# Patient Record
Sex: Female | Born: 1935 | Race: White | Hispanic: No | Smoking: Current every day smoker
Health system: Southern US, Community
[De-identification: ages and names within clinical notes are randomized; demographics above are authoritative.]

## PROBLEM LIST (undated history)

## (undated) DIAGNOSIS — J189 Pneumonia, unspecified organism: Secondary | ICD-10-CM

## (undated) DIAGNOSIS — R112 Nausea with vomiting, unspecified: Secondary | ICD-10-CM

## (undated) DIAGNOSIS — C801 Malignant (primary) neoplasm, unspecified: Secondary | ICD-10-CM

## (undated) DIAGNOSIS — H3530 Unspecified macular degeneration: Secondary | ICD-10-CM

## (undated) DIAGNOSIS — Z9889 Other specified postprocedural states: Secondary | ICD-10-CM

## (undated) DIAGNOSIS — K219 Gastro-esophageal reflux disease without esophagitis: Secondary | ICD-10-CM

## (undated) DIAGNOSIS — C189 Malignant neoplasm of colon, unspecified: Secondary | ICD-10-CM

## (undated) DIAGNOSIS — T4145XA Adverse effect of unspecified anesthetic, initial encounter: Secondary | ICD-10-CM

## (undated) DIAGNOSIS — T8859XA Other complications of anesthesia, initial encounter: Secondary | ICD-10-CM

## (undated) DIAGNOSIS — M1990 Unspecified osteoarthritis, unspecified site: Secondary | ICD-10-CM

## (undated) HISTORY — PX: SHOULDER ARTHROSCOPY: SHX128

## (undated) HISTORY — PX: HAND SURGERY: SHX662

## (undated) HISTORY — PX: EYE SURGERY: SHX253

## (undated) HISTORY — PX: JOINT REPLACEMENT: SHX530

## (undated) HISTORY — PX: OTHER SURGICAL HISTORY: SHX169

## (undated) HISTORY — PX: TONSILLECTOMY: SUR1361

## (undated) HISTORY — PX: MINOR HEMORRHOIDECTOMY: SHX6238

## (undated) HISTORY — PX: TOTAL KNEE ARTHROPLASTY: SHX125

## (undated) HISTORY — PX: CATARACT EXTRACTION, BILATERAL: SHX1313

## (undated) HISTORY — PX: HEMORRHOID SURGERY: SHX153

## (undated) HISTORY — PX: SHOULDER SURGERY: SHX246

---

## 1998-12-24 ENCOUNTER — Ambulatory Visit (HOSPITAL_BASED_OUTPATIENT_CLINIC_OR_DEPARTMENT_OTHER): Admission: RE | Admit: 1998-12-24 | Discharge: 1998-12-24 | Payer: Self-pay | Admitting: Orthopaedic Surgery

## 1999-08-19 ENCOUNTER — Encounter: Admission: RE | Admit: 1999-08-19 | Discharge: 1999-08-19 | Payer: Self-pay | Admitting: Orthopaedic Surgery

## 1999-08-19 ENCOUNTER — Encounter: Payer: Self-pay | Admitting: Orthopaedic Surgery

## 1999-08-24 ENCOUNTER — Ambulatory Visit (HOSPITAL_BASED_OUTPATIENT_CLINIC_OR_DEPARTMENT_OTHER): Admission: RE | Admit: 1999-08-24 | Discharge: 1999-08-24 | Payer: Self-pay | Admitting: Orthopaedic Surgery

## 2002-01-07 ENCOUNTER — Encounter: Payer: Self-pay | Admitting: Orthopaedic Surgery

## 2002-01-07 ENCOUNTER — Encounter: Admission: RE | Admit: 2002-01-07 | Discharge: 2002-01-07 | Payer: Self-pay | Admitting: Orthopaedic Surgery

## 2002-01-08 ENCOUNTER — Ambulatory Visit (HOSPITAL_BASED_OUTPATIENT_CLINIC_OR_DEPARTMENT_OTHER): Admission: RE | Admit: 2002-01-08 | Discharge: 2002-01-08 | Payer: Self-pay | Admitting: Orthopaedic Surgery

## 2002-05-22 ENCOUNTER — Encounter: Payer: Self-pay | Admitting: Orthopaedic Surgery

## 2002-05-30 ENCOUNTER — Inpatient Hospital Stay (HOSPITAL_COMMUNITY): Admission: RE | Admit: 2002-05-30 | Discharge: 2002-06-04 | Payer: Self-pay | Admitting: Orthopaedic Surgery

## 2002-06-04 ENCOUNTER — Inpatient Hospital Stay (HOSPITAL_COMMUNITY)
Admission: RE | Admit: 2002-06-04 | Discharge: 2002-06-07 | Payer: Self-pay | Admitting: Physical Medicine & Rehabilitation

## 2004-03-04 ENCOUNTER — Inpatient Hospital Stay (HOSPITAL_COMMUNITY): Admission: RE | Admit: 2004-03-04 | Discharge: 2004-03-09 | Payer: Self-pay | Admitting: Orthopaedic Surgery

## 2010-10-25 ENCOUNTER — Ambulatory Visit: Payer: Self-pay | Admitting: Family Medicine

## 2011-03-31 DIAGNOSIS — H33319 Horseshoe tear of retina without detachment, unspecified eye: Secondary | ICD-10-CM | POA: Insufficient documentation

## 2011-03-31 DIAGNOSIS — H353190 Nonexudative age-related macular degeneration, unspecified eye, stage unspecified: Secondary | ICD-10-CM | POA: Insufficient documentation

## 2011-12-29 DIAGNOSIS — Z9889 Other specified postprocedural states: Secondary | ICD-10-CM | POA: Insufficient documentation

## 2011-12-29 DIAGNOSIS — H33319 Horseshoe tear of retina without detachment, unspecified eye: Secondary | ICD-10-CM | POA: Diagnosis not present

## 2011-12-29 DIAGNOSIS — H353190 Nonexudative age-related macular degeneration, unspecified eye, stage unspecified: Secondary | ICD-10-CM | POA: Diagnosis not present

## 2011-12-29 DIAGNOSIS — Z961 Presence of intraocular lens: Secondary | ICD-10-CM | POA: Diagnosis not present

## 2012-02-18 DIAGNOSIS — Z23 Encounter for immunization: Secondary | ICD-10-CM | POA: Diagnosis not present

## 2012-04-25 DIAGNOSIS — J189 Pneumonia, unspecified organism: Secondary | ICD-10-CM

## 2012-04-25 HISTORY — DX: Pneumonia, unspecified organism: J18.9

## 2012-05-03 ENCOUNTER — Emergency Department (HOSPITAL_COMMUNITY): Payer: Medicare Other

## 2012-05-03 ENCOUNTER — Inpatient Hospital Stay (HOSPITAL_COMMUNITY)
Admission: EM | Admit: 2012-05-03 | Discharge: 2012-05-07 | DRG: 195 | Disposition: A | Discharge status: Home Health | Payer: Medicare Other | Attending: Internal Medicine | Admitting: Internal Medicine

## 2012-05-03 ENCOUNTER — Encounter (HOSPITAL_COMMUNITY): Payer: Self-pay | Admitting: *Deleted

## 2012-05-03 DIAGNOSIS — R112 Nausea with vomiting, unspecified: Secondary | ICD-10-CM | POA: Diagnosis not present

## 2012-05-03 DIAGNOSIS — R404 Transient alteration of awareness: Secondary | ICD-10-CM | POA: Diagnosis not present

## 2012-05-03 DIAGNOSIS — F29 Unspecified psychosis not due to a substance or known physiological condition: Secondary | ICD-10-CM | POA: Diagnosis not present

## 2012-05-03 DIAGNOSIS — R109 Unspecified abdominal pain: Secondary | ICD-10-CM

## 2012-05-03 DIAGNOSIS — D696 Thrombocytopenia, unspecified: Secondary | ICD-10-CM | POA: Diagnosis not present

## 2012-05-03 DIAGNOSIS — I498 Other specified cardiac arrhythmias: Secondary | ICD-10-CM | POA: Diagnosis not present

## 2012-05-03 DIAGNOSIS — J189 Pneumonia, unspecified organism: Principal | ICD-10-CM | POA: Diagnosis present

## 2012-05-03 DIAGNOSIS — J984 Other disorders of lung: Secondary | ICD-10-CM | POA: Diagnosis not present

## 2012-05-03 DIAGNOSIS — R1110 Vomiting, unspecified: Secondary | ICD-10-CM | POA: Diagnosis not present

## 2012-05-03 DIAGNOSIS — I959 Hypotension, unspecified: Secondary | ICD-10-CM | POA: Diagnosis present

## 2012-05-03 DIAGNOSIS — I059 Rheumatic mitral valve disease, unspecified: Secondary | ICD-10-CM | POA: Diagnosis not present

## 2012-05-03 DIAGNOSIS — K5900 Constipation, unspecified: Secondary | ICD-10-CM | POA: Diagnosis present

## 2012-05-03 DIAGNOSIS — R0602 Shortness of breath: Secondary | ICD-10-CM | POA: Diagnosis not present

## 2012-05-03 DIAGNOSIS — Z96659 Presence of unspecified artificial knee joint: Secondary | ICD-10-CM | POA: Diagnosis not present

## 2012-05-03 DIAGNOSIS — R680 Hypothermia, not associated with low environmental temperature: Secondary | ICD-10-CM | POA: Diagnosis not present

## 2012-05-03 DIAGNOSIS — R197 Diarrhea, unspecified: Secondary | ICD-10-CM | POA: Diagnosis not present

## 2012-05-03 DIAGNOSIS — T68XXXA Hypothermia, initial encounter: Secondary | ICD-10-CM

## 2012-05-03 DIAGNOSIS — R55 Syncope and collapse: Secondary | ICD-10-CM | POA: Diagnosis present

## 2012-05-03 DIAGNOSIS — F17200 Nicotine dependence, unspecified, uncomplicated: Secondary | ICD-10-CM | POA: Diagnosis not present

## 2012-05-03 DIAGNOSIS — K219 Gastro-esophageal reflux disease without esophagitis: Secondary | ICD-10-CM | POA: Diagnosis present

## 2012-05-03 DIAGNOSIS — I452 Bifascicular block: Secondary | ICD-10-CM | POA: Diagnosis present

## 2012-05-03 DIAGNOSIS — R42 Dizziness and giddiness: Secondary | ICD-10-CM | POA: Diagnosis not present

## 2012-05-03 HISTORY — DX: Pneumonia, unspecified organism: J18.9

## 2012-05-03 HISTORY — DX: Unspecified macular degeneration: H35.30

## 2012-05-03 HISTORY — DX: Unspecified osteoarthritis, unspecified site: M19.90

## 2012-05-03 LAB — URINALYSIS, ROUTINE W REFLEX MICROSCOPIC
Bilirubin Urine: NEGATIVE
Leukocytes, UA: NEGATIVE
Nitrite: NEGATIVE
Specific Gravity, Urine: 1.008 (ref 1.005–1.030)
Urobilinogen, UA: 0.2 mg/dL (ref 0.0–1.0)
pH: 7.5 (ref 5.0–8.0)

## 2012-05-03 LAB — CBC
MCH: 28.7 pg (ref 26.0–34.0)
MCHC: 32.6 g/dL (ref 30.0–36.0)
Platelets: 154 10*3/uL (ref 150–400)

## 2012-05-03 LAB — CBC WITH DIFFERENTIAL/PLATELET
Basophils Relative: 1 % (ref 0–1)
Eosinophils Absolute: 0.1 10*3/uL (ref 0.0–0.7)
Eosinophils Relative: 2 % (ref 0–5)
MCH: 28.9 pg (ref 26.0–34.0)
MCHC: 32.8 g/dL (ref 30.0–36.0)
MCV: 88 fL (ref 78.0–100.0)
Monocytes Relative: 6 % (ref 3–12)
Neutrophils Relative %: 65 % (ref 43–77)
Platelets: UNDETERMINED 10*3/uL (ref 150–400)

## 2012-05-03 LAB — COMPREHENSIVE METABOLIC PANEL
Albumin: 3.3 g/dL — ABNORMAL LOW (ref 3.5–5.2)
Alkaline Phosphatase: 63 U/L (ref 39–117)
BUN: 14 mg/dL (ref 6–23)
Calcium: 8.5 mg/dL (ref 8.4–10.5)
GFR calc Af Amer: 75 mL/min — ABNORMAL LOW (ref >90)
Glucose, Bld: 137 mg/dL — ABNORMAL HIGH (ref 70–99)
Potassium: 3.6 mEq/L (ref 3.5–5.1)
Sodium: 141 mEq/L (ref 135–145)
Total Protein: 6.3 g/dL (ref 6.0–8.3)

## 2012-05-03 LAB — POCT I-STAT TROPONIN I: Troponin i, poc: 0 ng/mL (ref 0.00–0.08)

## 2012-05-03 LAB — INFLUENZA PANEL BY PCR (TYPE A & B)
H1N1 flu by pcr: NOT DETECTED
Influenza B By PCR: NEGATIVE

## 2012-05-03 LAB — LIPASE, BLOOD: Lipase: 45 U/L (ref 11–59)

## 2012-05-03 LAB — CREATININE, SERUM: Creatinine, Ser: 0.77 mg/dL (ref 0.50–1.10)

## 2012-05-03 LAB — CG4 I-STAT (LACTIC ACID): Lactic Acid, Venous: 1.55 mmol/L (ref 0.5–2.2)

## 2012-05-03 MED ORDER — AZITHROMYCIN 500 MG PO TABS
500.0000 mg | ORAL_TABLET | Freq: Every day | ORAL | Status: DC
  Administered 2012-05-03 – 2012-05-06 (×4): 500 mg via ORAL
  Filled 2012-05-03 (×5): qty 1

## 2012-05-03 MED ORDER — ONDANSETRON HCL 4 MG PO TABS
4.0000 mg | ORAL_TABLET | Freq: Four times a day (QID) | ORAL | Status: DC | PRN
  Filled 2012-05-03: qty 1

## 2012-05-03 MED ORDER — AZITHROMYCIN 500 MG IV SOLR
500.0000 mg | Freq: Once | INTRAVENOUS | Status: AC
  Administered 2012-05-03: 500 mg via INTRAVENOUS
  Filled 2012-05-03: qty 500

## 2012-05-03 MED ORDER — ONDANSETRON HCL 4 MG/2ML IJ SOLN
4.0000 mg | Freq: Four times a day (QID) | INTRAMUSCULAR | Status: DC | PRN
  Administered 2012-05-03: 4 mg via INTRAVENOUS
  Filled 2012-05-03: qty 2

## 2012-05-03 MED ORDER — SODIUM CHLORIDE 0.9 % IV SOLN
INTRAVENOUS | Status: DC
  Administered 2012-05-03 – 2012-05-06 (×8): via INTRAVENOUS

## 2012-05-03 MED ORDER — SODIUM CHLORIDE 0.9 % IV BOLUS (SEPSIS)
1000.0000 mL | Freq: Once | INTRAVENOUS | Status: AC
  Administered 2012-05-03: 1000 mL via INTRAVENOUS

## 2012-05-03 MED ORDER — POLYETHYLENE GLYCOL 3350 17 G PO PACK
17.0000 g | PACK | Freq: Every day | ORAL | Status: DC
  Administered 2012-05-03 – 2012-05-06 (×3): 17 g via ORAL
  Filled 2012-05-03 (×5): qty 1

## 2012-05-03 MED ORDER — PANTOPRAZOLE SODIUM 40 MG PO TBEC
40.0000 mg | DELAYED_RELEASE_TABLET | Freq: Every day | ORAL | Status: DC
  Administered 2012-05-03 – 2012-05-07 (×5): 40 mg via ORAL
  Filled 2012-05-03 (×4): qty 1

## 2012-05-03 MED ORDER — DEXTROMETHORPHAN POLISTIREX 30 MG/5ML PO LQCR
15.0000 mg | Freq: Two times a day (BID) | ORAL | Status: DC
  Administered 2012-05-03 – 2012-05-07 (×9): 15 mg via ORAL
  Filled 2012-05-03 (×11): qty 5

## 2012-05-03 MED ORDER — ONDANSETRON HCL 4 MG/2ML IJ SOLN
4.0000 mg | Freq: Once | INTRAMUSCULAR | Status: AC
  Administered 2012-05-03: 4 mg via INTRAVENOUS

## 2012-05-03 MED ORDER — ACETAMINOPHEN 325 MG PO TABS
650.0000 mg | ORAL_TABLET | Freq: Four times a day (QID) | ORAL | Status: DC | PRN
  Administered 2012-05-03 – 2012-05-06 (×8): 650 mg via ORAL
  Filled 2012-05-03 (×8): qty 2

## 2012-05-03 MED ORDER — DEXTROSE 5 % IV SOLN: 1.0000 g | Freq: Once | INTRAVENOUS | Status: DC

## 2012-05-03 MED ORDER — ACETAMINOPHEN 650 MG RE SUPP: 650.0000 mg | Freq: Four times a day (QID) | RECTAL | Status: DC | PRN

## 2012-05-03 MED ORDER — DEXTROSE 5 % IV SOLN
1.0000 g | INTRAVENOUS | Status: DC
  Administered 2012-05-04 – 2012-05-06 (×3): 1 g via INTRAVENOUS
  Filled 2012-05-03 (×4): qty 10

## 2012-05-03 MED ORDER — ONDANSETRON HCL 4 MG/2ML IJ SOLN
INTRAMUSCULAR | Status: AC
  Filled 2012-05-03: qty 2

## 2012-05-03 MED ORDER — ENOXAPARIN SODIUM 40 MG/0.4ML ~~LOC~~ SOLN
40.0000 mg | SUBCUTANEOUS | Status: DC
  Administered 2012-05-03 – 2012-05-04 (×2): 40 mg via SUBCUTANEOUS
  Filled 2012-05-03 (×2): qty 0.4

## 2012-05-03 MED ORDER — DEXTROSE 5 % IV SOLN
1.0000 g | Freq: Once | INTRAVENOUS | Status: AC
  Administered 2012-05-03: 1 g via INTRAVENOUS
  Filled 2012-05-03: qty 10

## 2012-05-03 MED ORDER — PROMETHAZINE HCL 25 MG/ML IJ SOLN
12.5000 mg | Freq: Once | INTRAMUSCULAR | Status: AC
  Administered 2012-05-03: 12.5 mg via INTRAVENOUS
  Filled 2012-05-03: qty 1

## 2012-05-03 NOTE — ED Provider Notes (Addendum)
History     CSN: [id number]  Arrival date & time 05/03/12  6213   First MD Initiated Contact with Patient 05/03/12 [phone]      Chief Complaint  Patient presents with  . Near Syncope  . Nausea  . Emesis    (Consider location/radiation/quality/duration/timing/severity/associated sxs/prior treatment) Patient is a 79 y.o. female presenting with vomiting. The history is provided by the patient.  Emesis  This is a new problem. The current episode started yesterday. The problem occurs 5 to 10 times per day. The problem has not changed since onset.The emesis has an appearance of stomach contents. There has been no fever. Associated symptoms include abdominal pain, chills, cough, diarrhea, myalgias and sweats. Pertinent negatives include no fever and no headaches. Risk factors: unknown.    History reviewed. No pertinent past medical history.  History reviewed. No pertinent past surgical history.  No family history on file.  History  Substance Use Topics  . Smoking status: Not on file  . Smokeless tobacco: Not on file  . Alcohol Use: No    OB History    Grav Para Term Preterm Abortions TAB SAB Ect Mult Living                  Review of Systems  Constitutional: Positive for chills. Negative for fever.  Respiratory: Positive for cough and shortness of breath.   Gastrointestinal: Positive for vomiting, abdominal pain and diarrhea.  Musculoskeletal: Positive for myalgias.  Neurological: Negative for headaches.  All other systems reviewed and are negative.    Allergies  Aspirin; Codeine; Morphine and related; and Penicillins  Home Medications  No current outpatient prescriptions on file.  BP 132/61  Pulse 61  Temp 95.1 F (35.1 C) (Rectal)  Resp 18  SpO2 99%  Physical Exam  Nursing note and vitals reviewed. Constitutional: She is oriented to person, place, and time. She appears well-developed and well-nourished. She appears distressed.  HENT:  Head: Normocephalic and  atraumatic.  Mouth/Throat: Mucous membranes are dry.  Eyes: EOM are normal. Pupils are equal, round, and reactive to light.  Cardiovascular: Normal rate, regular rhythm, normal heart sounds and intact distal pulses.  Exam reveals no friction rub.   No murmur heard. Pulmonary/Chest: Effort normal and breath sounds normal. She has no wheezes. She has no rales.  Abdominal: Soft. Bowel sounds are normal. She exhibits no distension. There is no tenderness. There is no rebound and no guarding.  Musculoskeletal: Normal range of motion. She exhibits no tenderness.       No edema  Neurological: She is alert and oriented to person, place, and time. No cranial nerve deficit.  Skin: Skin is warm and dry. No rash noted.  Psychiatric: She has a normal mood and affect. Her behavior is normal.    ED Course  Procedures (including critical care time)  Labs Reviewed  CBC WITH DIFFERENTIAL - Abnormal; Notable for the following:    Hemoglobin 11.8 (*)     All other components within normal limits  COMPREHENSIVE METABOLIC PANEL - Abnormal; Notable for the following:    Glucose, Bld 137 (*)     Albumin 3.3 (*)     Total Bilirubin 0.2 (*)     GFR calc non Af Amer 65 (*)     GFR calc Af Amer 75 (*)     All other components within normal limits  LIPASE, BLOOD  CG4 I-STAT (LACTIC ACID)  POCT I-STAT TROPONIN I  URINALYSIS, ROUTINE W REFLEX MICROSCOPIC  Dg Abd Acute W/chest  05/03/2012  *RADIOLOGY REPORT*  Clinical Data: Abdominal pain, vomiting  ACUTE ABDOMEN SERIES (ABDOMEN 2 VIEW & CHEST 1 VIEW)  Comparison: Chest radiographs dated 03/01/2004  Findings: Mild patchy right lower lobe opacity, atelectasis versus pneumonia. No pleural effusion or pneumothorax.  The heart is normal in size.  Nonobstructive bowel gas pattern.  No evidence of free air on the lateral decubitus view.  Moderate stool in the right colon.  Degenerative changes of the visualized thoracolumbar spine.  IMPRESSION: Mild patchy right lower  lobe opacity, atelectasis versus pneumonia.  No evidence of small bowel obstruction or free air.  Moderate stool in the right colon.   Original Report Authenticated By: Charline Bills, M.D.      Date: 05/03/2012  Rate: 63  Rhythm: normal sinus rhythm  QRS Axis: normal  Intervals: normal  ST/T Wave abnormalities: nonspecific ST/T changes  Conduction Disutrbances:right bundle branch block  Narrative Interpretation:   Old EKG Reviewed: unchanged   1. CAP (community acquired pneumonia)       MDM   Patient with nausea vomiting since last night. She states she had diarrhea however she's had a formed stool here denies any recent antibiotic use. She complains of generalized abdominal pain however there is no focal pain on exam. Patient is still retching and appears comfortable.  She denies any shortness of breath, or chest pain. However patient is unable to complete a full history because of retching and poor cooperation.  Concern for possible viral etiology versus abdominal pathology such as pancreatitis however no focal left upper quadrant pain however no signs concerning for appendicitis, diverticulitis, cholecystitis, possible respiratory cause the patient is endorsing cough and mild shortness of breath.  CBC, CMP, lipase, UA, acute abdominal series, lactate pending. EKG shows normal sinus rhythm with right bundle branch block which is unchanged. Patient given IV fluids and Zofran.   8:29 AM Patient still retching on exam. On reevaluation she does state she's been coughing the last few days and her acute abdominal series today shows a new right lower lobe opacity which is new to me acquired pneumonia would explain her symptoms of vomiting and no localized abdominal pain.   Gwyneth Sprout, MD 05/03/12 0830  Gwyneth Sprout, MD 05/03/12 [phone]

## 2012-05-03 NOTE — ED Notes (Signed)
Results of lactic acid shown to Dr. Anitra Lauth

## 2012-05-03 NOTE — H&P (Signed)
Hospital Admission Note Date: 05/03/2012  Patient name: Joann Herrera Medical record number: [id number] Date of birth: 1923-06-12 Age: 79 y.o. Gender: female PCP: No primary provider on file.  Medical Service: Internal Medicine Teaching Service  Attending physician:  Dr. Criselda Peaches   1st Contact:  Dr. Zada Girt   Pager: [phone] 2nd Contact:  Dr. Bosie Clos   Pager:[phone] After 5 pm or weekends: 1st Contact:      Pager: [phone] 2nd Contact:      Pager: [phone]  Chief Complaint:  Cough and SOB  History of Present Illness: Joann Herrera is an 79 yo W with PMH of osteoarthritis status post bilateral knee replacement presents to the ED for cough x several months in duration. She denies any SOB or chest pain.  Of note, history was difficult to elicit since patient was somnolent secondary to Phenergan and Zofran. She reports having white sputum but nonbloody. Patient smokes one pack per day in the past 30 years. Patient lives alone and has no recent sick contact. She denies any fever but does have chills, nausea, vomiting, and abdominal pain. As for abdominal pain, she states that she has been having abdominal pain for many years and takes an antacid as outpatient. Abdominal pain is not associated with food and that she has diarrhea.  She was unable to tell me of her diarrhea was loose or watery. However in the ED for stool as well formed per ED physician. She reports feeling lightheaded last night and unclear if she fell but her daughter from [place] said her life alert went off.  Patient has not seen a primary care physician in many years.  Meds: Current Outpatient Rx  Name  Route  Sig  Dispense  Refill  . TYLENOL PO   Oral   Take 1 tablet by mouth every 6 (six) hours as needed. For pain            Allergies: Allergies as of 05/03/2012 - Review Complete 05/03/2012  Allergen Reaction Noted  . Aspirin Other (See Comments) 05/03/2012  . Codeine Other (See Comments) 05/03/2012  . Morphine and related  Other (See Comments) 05/03/2012  . Penicillins Other (See Comments) 05/03/2012   History reviewed. No pertinent past medical history. History reviewed. Bilateral knee replacement many years ago Family history of heart disease, kidney cancer in her brother, lung cancer in her sister. History   Social History  . Marital Status: Widowed    Spouse Name: N/A    Number of Children: N/A  . Years of Education: N/A   Occupational History  . Not on file.   Social History Main Topics  . Smoking status:  one pack per day x30 years   . Smokeless tobacco: Not on file  . Alcohol Use: No  . Drug Use: No  . Sexually Active:   Patient has a daughter who lives in [place] and 2 sons who also lives out of state. She lives alone at home.  Review of Systems: Constitutional: Denies fever, + chills, diaphoresis, appetite change and fatigue.  HEENT: Denies photophobia, eye pain, redness, hearing loss, ear pain, congestion, sore throat, rhinorrhea, sneezing, mouth sores, trouble swallowing, neck pain, neck stiffness and tinnitus.  Respiratory: Denies SOB, DOE, +cough, chest tightness, and wheezing.  Cardiovascular: Denies chest pain, palpitations and leg swelling.  Gastrointestinal: + nausea,+ vomiting, +abdominal pain, +diarrhea, constipation, blood in stool and abdominal distention.  Genitourinary: Denies dysuria, urgency, frequency, hematuria, flank pain and difficulty urinating.  Musculoskeletal: Denies myalgias, back pain, joint  swelling, arthralgias and +gait problem.  Skin: Denies pallor, rash and wound.  Neurological: Denies dizziness, seizures, syncope, weakness, +light-headedness, numbness and headaches.  Hematological: Denies adenopathy. Easy bruising, personal or family bleeding history  Psychiatric/Behavioral: Denies suicidal ideation, mood changes, confusion, nervousness, sleep disturbance and agitation  Physical Exam: Blood pressure 108/61, pulse 67, temperature 95.1 F (35.1 C),  temperature source Rectal, resp. rate 15, SpO2 100.00%. General: somnolent, and somewhat cooperative to examination.  Head: normocephalic and atraumatic.  Eyes: vision grossly intact, pupils equal, pupils round, pupils reactive to light, no injection and anicteric.  Mouth: pharynx pink and moist, no erythema, and no exudates.  Neck: supple, full ROM, no thyromegaly, no JVD, and no carotid bruits.  Lungs: normal respiratory effort, no accessory muscle use, diffused decreased breath sounds due to poor effort, no crackles, and no wheezes. Heart: normal rate, regular rhythm, no murmur, no gallop, and no rub.  Abdomen: soft, +tender around epigastrium, normal bowel sounds, no distention, no guarding, no rebound tenderness Msk: no joint swelling, no joint warmth, and no redness over joints. Healed scars on bilateral knees Pulses: 2+ DP/PT pulses bilaterally Extremities: No cyanosis, clubbing, edema Neurologic: somnolent, cranial nerves grossly II-XII intact, strength normal in all extremities, sensation intact to light touch  Skin: turgor normal and no rashes.  Psych: unable to assess  Lab results: Basic Metabolic Panel:  [facility] 05/03/12 0720  NA 141  K 3.6  CL 107  CO2 22  GLUCOSE 137*  BUN 14  CREATININE 0.80  CALCIUM 8.5  MG --  PHOS --   Liver Function Tests:  Basename 05/03/12 0720  AST 11  ALT 6  ALKPHOS 63  BILITOT 0.2*  PROT 6.3  ALBUMIN 3.3*    Basename 05/03/12 0720  LIPASE 45  AMYLASE --   CBC:  Basename 05/03/12 0720  WBC 8.2  NEUTROABS 5.3  HGB 11.8*  HCT 36.0  MCV 88.0  PLT PLATELET CLUMPS NOTED ON SMEAR, UNABLE TO ESTIMATE    Imaging results:  Dg Abd Acute W/chest  05/03/2012  *RADIOLOGY REPORT*  Clinical Data: Abdominal pain, vomiting  ACUTE ABDOMEN SERIES (ABDOMEN 2 VIEW & CHEST 1 VIEW)  Comparison: Chest radiographs dated 03/01/2004  Findings: Mild patchy right lower lobe opacity, atelectasis versus pneumonia. No pleural effusion or  pneumothorax.  The heart is normal in size.  Nonobstructive bowel gas pattern.  No evidence of free air on the lateral decubitus view.  Moderate stool in the right colon.  Degenerative changes of the visualized thoracolumbar spine.  IMPRESSION: Mild patchy right lower lobe opacity, atelectasis versus pneumonia.  No evidence of small bowel obstruction or free air.  Moderate stool in the right colon.   Original Report Authenticated By: Charline Bills, M.D.     Other results: EKG: RBBB with borderline 1st degree AV block.  Nonspecific S-T wave changes  Assessment & Plan by Problem:   1. CAP (community acquired pneumonia): clinical presentation and CXR consistent with RLL PNA- community acquired.  This is likely bacterial but viral etiology is also on the differential given high influenza prevalence in the community.   -Admit to telemetry -Continue Azithromycin and Rocephin (she tolerated Rocephin in ED without adverse rxn) -Check influenza PCR, urine strep and pneumo, sputum culture -Will not get blood culture at this time because she already received abx in ED, if she continues to be hypothermic or become febrile, will obtain blood cultures -O2 supplementation with [place] -Zofran for N/V -IVF hydration with NS at 100cc/hr -Delsym for  cough  2. RBBB: unclear etiology at this time.  This is new on EKG today compare to 02/2004.  Etiologies include chronically increased right ventricular pressure, as in cor pulmonale or PE, or MI/inflammation such as myocarditis.  Other less common causes are HTN, cardiomyopathies.  PE is unlikely as she is not hypoxic and her revised Geneva score is 1 for age which gives her low probability, therefore, will not get CTA of chest.  Patient does not have HTN. -Will consider getting 2E echocardiogram to evaluate her heart structure.  3. Hypotension: slightly hypotensive with BP as low as 107/51, lactic acid was wnl at 1.55, she does not meet SIR criteria (only has  hypothermia T 95.1). She was given 1L bolus NS in ED.  - Will continue NS @ 100cc/hr. -Warming blanket  4. Abdominal pain: tenderness at epigastrim but this is chronic x many years. Lipase is 45 which makes pancreatitis unlikely.  Other ddx include GERD, peptic ulcer disease, H pylori or constipation.  She is not taking any NSAIDs or steroids at home.  She states that she takes antacid but does not know the name.  Will try protonix to see if it helps and she may need EGD as outpatient if she desires for further evaluation.  She reports diarrhea but actually stool was well-formed in ED and on abd xray, there is moderate stool in right colon. -Protonix 40mg  po qd -Give Miralax 17g qd  DVT ppx: Lovenox SQ  Code Status: FULL code, I confirmed with patient in ED Daughter  Jasmine December: [phone] Niece Boyd Kerbs [phone]  Dispo: Disposition is deferred at this time, awaiting improvement of current medical problems. Anticipated discharge in approximately 2-3 day(s).   The patient does not  have a current PCP (No primary provider on file.), therefore will require OPC follow-up after discharge.   The patient  Does not have transportation limitations that hinder transportation to clinic appointments.  Signed: Cj Edgell 05/03/2012, 9:24 AM

## 2012-05-03 NOTE — ED Notes (Signed)
Patient with episode of dizziness tonight at home followed by n/v/d.  Patient also c/o left sided abdominal pain.

## 2012-05-03 NOTE — ED Notes (Signed)
Pt has returned from being out of the department; pt placed back on monitor, continuous pulse oximetry and blood pressure cuff; family at bedside 

## 2012-05-03 NOTE — ED Notes (Signed)
Rectal temp reported to Dr.Miller

## 2012-05-03 NOTE — ED Notes (Signed)
Warming blanket applied to patient

## 2012-05-04 ENCOUNTER — Inpatient Hospital Stay (HOSPITAL_COMMUNITY): Payer: Medicare Other

## 2012-05-04 LAB — URINE CULTURE
Colony Count: NO GROWTH
Culture: NO GROWTH

## 2012-05-04 LAB — BASIC METABOLIC PANEL
BUN: 11 mg/dL (ref 6–23)
Chloride: 109 mEq/L (ref 96–112)
Creatinine, Ser: 0.9 mg/dL (ref 0.50–1.10)
GFR calc Af Amer: 65 mL/min — ABNORMAL LOW (ref >90)
GFR calc non Af Amer: 56 mL/min — ABNORMAL LOW (ref >90)
Glucose, Bld: 93 mg/dL (ref 70–99)

## 2012-05-04 LAB — CBC
HCT: 30.8 % — ABNORMAL LOW (ref 36.0–46.0)
MCH: 28.7 pg (ref 26.0–34.0)
MCHC: 32.5 g/dL (ref 30.0–36.0)
RDW: 14.3 % (ref 11.5–15.5)

## 2012-05-04 MED ORDER — SODIUM CHLORIDE 0.9 % IV BOLUS (SEPSIS)
500.0000 mL | Freq: Once | INTRAVENOUS | Status: AC
  Administered 2012-05-04: 500 mL via INTRAVENOUS

## 2012-05-04 MED ORDER — SODIUM CHLORIDE 0.9 % IV BOLUS (SEPSIS)
1000.0000 mL | Freq: Once | INTRAVENOUS | Status: AC
  Administered 2012-05-04: 1000 mL via INTRAVENOUS

## 2012-05-04 MED ORDER — ENOXAPARIN SODIUM 40 MG/0.4ML ~~LOC~~ SOLN
40.0000 mg | SUBCUTANEOUS | Status: DC
  Administered 2012-05-05 – 2012-05-06 (×2): 40 mg via SUBCUTANEOUS
  Filled 2012-05-04 (×4): qty 0.4

## 2012-05-04 MED ORDER — GADOBENATE DIMEGLUMINE 529 MG/ML IV SOLN
15.0000 mL | Freq: Once | INTRAVENOUS | Status: AC | PRN
  Administered 2012-05-04: 15 mL via INTRAVENOUS

## 2012-05-04 NOTE — H&P (Signed)
Internal Medicine Teaching Service Attending Note Date: 05/04/2012  Patient name: Joann Herrera  Medical record number: [id number]  Date of birth: 1924/04/07   I have seen and evaluated Joann Herrera and discussed their care with the Residency Team.    Joann Herrera is an 79yo woman with PMH of OA and chronic abdominal pain who presented to the ED because she used her life alert necklace for an ambulance to come visit.  When first seen, she was very sleepy and her medical history was difficult to obtain.  She had been given phenergan in the ED.  At that time she apparently reported having a cough, SOB and production of white sputum.  When I saw her, she denied these symptoms and reported only an occasional cough.  She denied fever, chills, recent URI symptoms.  She does have chronic abdominal pain, in the upper part of her stomach which she describes as "upset" feeling.  The pain has been going on for many years and she takes an antacid with improvement in her symptoms.  She has a history of on and off diarrhea that lasts for about 3 days, is watery.  She is currently having this issue now.  She take immodium for this issue.  Per the ED physician, she had a well formed stool in the ED.   What prompted her to be seen by healthcare this time is as follows:  On the day of admission, she had an acute episode of nausea and vomiting, which did not have blood in it.  After this happened, she began to have dizziness which caused her to be unsteady on her feet.  She describes the dizziness as lightheaded and the room spinning.  She has had "inner ear" problems before, which resolved with rest and a "small white pill" that a doctor gave her.  Her dizziness progressed to the point that when she stood she felt like she was going to black out.  The dizziness started while sitting on the side of her bed and continued to standing.  It is worse with "any" movement of her head.  She called life alert at that time  explaining these symptoms and then she reported that she did in fact black out.  She was subsequently brought to the ED.  She complained of nausea and abdominal pain in the ED and received phenergan and zofran which caused her to be sleepy and made the history hard to obtain at first.   Joann Herrera has not had much interaction with healthcare and does not have a PCP. She takes only tylenol at home.  She is a current every day smoker.    Physical Exam: Blood pressure 103/38, pulse 54, temperature 98.2 F (36.8 C), temperature source Oral, resp. rate 18, height 5\' 5"  (1.651 m), weight 136 lb 14.5 oz (62.1 kg), SpO2 95.00%. General appearance: alert, cooperative, appears stated age, no distress and expresses feeling dizzy Head: Normocephalic, without obvious abnormality, atraumatic Eyes: EOMI, anicteric sclerae Lungs: some course breath sounds, relatively clear, no wheezing Heart: bradycardic, NR Abdomen: TTP in epigastrium, +BS Extremities: thin, no edema Skin: Skin color, texture, turgor normal. No rashes or lesions or no rash or wound Neurologic: Mental status: Alert, oriented, thought content appropriate Cranial nerves: III,VII: ptosis not present, III,IV,VI: extraocular muscles extra-ocular motions intact, V: facial light touch sensation normal bilaterally, VII: upper facial muscle function normal bilaterally, VII: lower facial muscle function normal bilaterally Sensory: normal, to light touch Motor: grossly normal Gait: Patient  was dizzy on standing.  She was able to stand up with help, was somewhat unsteady, able to orient herself and side step to get situated on bed without difficulty.  Further gait testing deferred  Lab results: Results for orders placed during the hospital encounter of 05/03/12 (from the past 24 hour(s))  CBC     Status: Abnormal   Collection Time   05/03/12  1:10 PM      Component Value Range   WBC 7.0  4.0 - 10.5 K/uL   RBC 3.76 (*) 3.87 - 5.11 MIL/uL   Hemoglobin  10.8 (*) 12.0 - 15.0 g/dL   HCT 16.1 (*) 09.6 - 04.5 %   MCV 88.0  78.0 - 100.0 fL   MCH 28.7  26.0 - 34.0 pg   MCHC 32.6  30.0 - 36.0 g/dL   RDW 40.9  81.1 - 91.4 %   Platelets 154  150 - 400 K/uL  CREATININE, SERUM     Status: Abnormal   Collection Time   05/03/12  1:10 PM      Component Value Range   Creatinine, Ser 0.77  0.50 - 1.10 mg/dL   GFR calc non Af Amer 74 (*) >90 mL/min   GFR calc Af Amer 86 (*) >90 mL/min  URINALYSIS, ROUTINE W REFLEX MICROSCOPIC     Status: Normal   Collection Time   05/03/12  2:56 PM      Component Value Range   Color, Urine YELLOW  YELLOW   APPearance CLEAR  CLEAR   Specific Gravity, Urine 1.008  1.005 - 1.030   pH 7.5  5.0 - 8.0   Glucose, UA NEGATIVE  NEGATIVE mg/dL   Hgb urine dipstick NEGATIVE  NEGATIVE   Bilirubin Urine NEGATIVE  NEGATIVE   Ketones, ur NEGATIVE  NEGATIVE mg/dL   Protein, ur NEGATIVE  NEGATIVE mg/dL   Urobilinogen, UA 0.2  0.0 - 1.0 mg/dL   Nitrite NEGATIVE  NEGATIVE   Leukocytes, UA NEGATIVE  NEGATIVE  STREP PNEUMONIAE URINARY ANTIGEN     Status: Normal   Collection Time   05/03/12  2:56 PM      Component Value Range   Strep Pneumo Urinary Antigen NEGATIVE  NEGATIVE  INFLUENZA PANEL BY PCR     Status: Normal   Collection Time   05/03/12  3:51 PM      Component Value Range   Influenza A By PCR NEGATIVE  NEGATIVE   Influenza B By PCR NEGATIVE  NEGATIVE   H1N1 flu by pcr NOT DETECTED  NOT DETECTED  BASIC METABOLIC PANEL     Status: Abnormal   Collection Time   05/04/12  4:25 AM      Component Value Range   Sodium 139  135 - 145 mEq/L   Potassium 3.9  3.5 - 5.1 mEq/L   Chloride 109  96 - 112 mEq/L   CO2 21  19 - 32 mEq/L   Glucose, Bld 93  70 - 99 mg/dL   BUN 11  6 - 23 mg/dL   Creatinine, Ser 7.82  0.50 - 1.10 mg/dL   Calcium 8.3 (*) 8.4 - 10.5 mg/dL   GFR calc non Af Amer 56 (*) >90 mL/min   GFR calc Af Amer 65 (*) >90 mL/min  CBC     Status: Abnormal   Collection Time   05/04/12  4:25 AM      Component Value Range    WBC 5.6  4.0 - 10.5 K/uL   RBC  3.48 (*) 3.87 - 5.11 MIL/uL   Hemoglobin 10.0 (*) 12.0 - 15.0 g/dL   HCT 16.1 (*) 09.6 - 04.5 %   MCV 88.5  78.0 - 100.0 fL   MCH 28.7  26.0 - 34.0 pg   MCHC 32.5  30.0 - 36.0 g/dL   RDW 40.9  81.1 - 91.4 %   Platelets 174  150 - 400 K/uL    Imaging results:  Dg Abd Acute W/chest  05/03/2012  *RADIOLOGY REPORT*  Clinical Data: Abdominal pain, vomiting  ACUTE ABDOMEN SERIES (ABDOMEN 2 VIEW & CHEST 1 VIEW)  Comparison: Chest radiographs dated 03/01/2004  Findings: Mild patchy right lower lobe opacity, atelectasis versus pneumonia. No pleural effusion or pneumothorax.  The heart is normal in size.  Nonobstructive bowel gas pattern.  No evidence of free air on the lateral decubitus view.  Moderate stool in the right colon.  Degenerative changes of the visualized thoracolumbar spine.  IMPRESSION: Mild patchy right lower lobe opacity, atelectasis versus pneumonia.  No evidence of small bowel obstruction or free air.  Moderate stool in the right colon.   Original Report Authenticated By: Charline Bills, M.D.     Assessment and Plan: I agree with the formulated Assessment and Plan with the following changes:   1. Syncope/dizziness - Will check MRI for possible posterior circulation stroke, she has an unknown medical history as she has decreased interaction with healthcare over the past years - She has a history of inner ear problems, though her symptoms are not completely consistent with vertigo, Vestibular PT may be an option - EKG this am to assess bradycardia - she was with a normal HR in the ED, but overnight and this AM has been persistently bradycardic - Consider TTE once we get more information - She has a new RBBB compared with 2005, however, it is unclear when this occurred.   Troponin X 1 in the ED was negative - She also has had low blood pressures today, IVF given and BP improved.  She was not orthostatic after IVF given by bolus.  - She is not on any  medications to lower BP or HR  2. Presumed CAP - She currently denies any symptoms - She has received zithromx and rocephin - Her WBC is normal, she has no fever.  However, she has had a low blood pressure which could be an early sign in the elderly of acute infection, she was also hypothermic in the ED, which has normalized - Will continue ABx for now while undergoing syncope work up - Consider repeat CXR in AM to evaluate consolidation - if already improved/improving, makes pneumonia less likely  3. Abdominal pain, chronic - Protonix and Miralax for constipation - AXR showed only moderate stool - Follow closely, no PE signs of acute abdomen  Other issues as per resident note.   Inez Catalina, MD 1/10/201411:01 AM

## 2012-05-04 NOTE — Progress Notes (Signed)
Subjective: Joann Herrera complains of dizziness and some headaches. Her cardiac monitor reveals episodes of PVCs and bradycardias up to the 40s to 50s. Yesterday's heart rate were in the 80s. She is currently able to provide some more details about her history, and she reports that actually she passed out yesterday before she was brought to the ED. She denies history of hitting her head. She denies weakness in and of her extremities, vomiting, visual changes, or hearing disturbances. She reports history of ear pains, and disturbances in the past that had to be treated with surgery. She denies vertigo however, she reports that her dizziness, is increased by change of position of her head. She feels that her dizziness increases on standing up. She denies any abnormal sensations in the extremities. I was called by the nurse this morning, when [name] was experiencing hypotension with blood pressures in the ranges of 90/32. Her blood pressure came up to 108/51 after 1.5 L of normal saline. However, she continued feeling dizzy. The family requests to have a CT scan of her head. Objective: Vital signs in last 24 hours: Filed Vitals:   05/04/12 0945 05/04/12 1025 05/04/12 1147 05/04/12 1339  BP: 96/32 103/38 108/51 100/46  Pulse: 54  51 65  Temp:    97.3 F (36.3 C)  TempSrc:    Oral  Resp:    18  Height:      Weight:      SpO2:    96%   Weight change:   Intake/Output Summary (Last 24 hours) at 05/04/12 1439 Last data filed at 05/04/12 1203  Gross per 24 hour  Intake   2350 ml  Output   3200 ml  Net   -850 ml   General appearance: alert, cooperative, appears older than stated age and no distress Head: Normocephalic, without obvious abnormality, atraumatic Eyes: conjunctivae/corneas clear. PERRL, EOM's intact. Fundi benign. Ears: normal TM's and external ear canals both ears and there is wax impaction bilaterally Neck: no adenopathy, no carotid bruit, no JVD, supple, symmetrical, trachea midline  and thyroid not enlarged, symmetric, no tenderness/mass/nodules Lungs: clear to auscultation bilaterally Heart: regular rate and rhythm, S1, S2 normal, no murmur, click, rub or gallop Abdomen: soft, non-tender; bowel sounds normal; no masses,  no organomegaly and mild tenderness which is generalised. Extremities: extremities normal, atraumatic, no cyanosis or edema Pulses: 2+ and symmetric Neurologic: Alert and oriented X 3, normal strength and tone. Normal symmetric reflexes. Normal coordination and gait Gait: unable to walk due to dizziness. BP did not drop on standing up.  Lab Results: Basic Metabolic Panel:  Lab 05/04/12 8413 05/03/12 1310 05/03/12 0720  NA 139 -- 141  K 3.9 -- 3.6  CL 109 -- 107  CO2 21 -- 22  GLUCOSE 93 -- 137*  BUN 11 -- 14  CREATININE 0.90 0.77 --  CALCIUM 8.3* -- 8.5  MG -- -- --  PHOS -- -- --   Liver Function Tests:  Lab 05/03/12 0720  AST 11  ALT 6  ALKPHOS 63  BILITOT 0.2*  PROT 6.3  ALBUMIN 3.3*    Lab 05/03/12 0720  LIPASE 45  AMYLASE --   CBC:  Lab 05/04/12 0425 05/03/12 1310 05/03/12 0720  WBC 5.6 7.0 --  NEUTROABS -- -- 5.3  HGB 10.0* 10.8* --  HCT 30.8* 33.1* --  MCV 88.5 88.0 --  PLT 174 154 --    Urinalysis:  Lab 05/03/12 1456  COLORURINE YELLOW  LABSPEC 1.008  PHURINE 7.5  GLUCOSEU NEGATIVE  HGBUR NEGATIVE  BILIRUBINUR NEGATIVE  KETONESUR NEGATIVE  PROTEINUR NEGATIVE  UROBILINOGEN 0.2  NITRITE NEGATIVE  LEUKOCYTESUR NEGATIVE     Micro Results: Recent Results (from the past 240 hour(s))  URINE CULTURE     Status: Normal   Collection Time   05/03/12  2:56 PM      Component Value Range Status Comment   Specimen Description URINE, CLEAN CATCH   Final    Special Requests NONE   Final    Culture  Setup Time 05/03/2012 16:05   Final    Colony Count NO GROWTH   Final    Culture NO GROWTH   Final    Report Status 05/04/2012 FINAL   Final    Studies/Results: Dg Abd Acute W/chest  05/03/2012  *RADIOLOGY REPORT*   Clinical Data: Abdominal pain, vomiting  ACUTE ABDOMEN SERIES (ABDOMEN 2 VIEW & CHEST 1 VIEW)  Comparison: Chest radiographs dated 03/01/2004  Findings: Mild patchy right lower lobe opacity, atelectasis versus pneumonia. No pleural effusion or pneumothorax.  The heart is normal in size.  Nonobstructive bowel gas pattern.  No evidence of free air on the lateral decubitus view.  Moderate stool in the right colon.  Degenerative changes of the visualized thoracolumbar spine.  IMPRESSION: Mild patchy right lower lobe opacity, atelectasis versus pneumonia.  No evidence of small bowel obstruction or free air.  Moderate stool in the right colon.   Original Report Authenticated By: Charline Bills, M.D.    Medications: I have reviewed the patient's current medications. Scheduled Meds:   . azithromycin  500 mg Oral Daily  . cefTRIAXone (ROCEPHIN)  IV  1 g Intravenous Q24H  . dextromethorphan  15 mg Oral BID  . enoxaparin (LOVENOX) injection  40 mg Subcutaneous Q24H  . pantoprazole  40 mg Oral Daily  . polyethylene glycol  17 g Oral Daily   Continuous Infusions:   . sodium chloride 100 mL/hr at 05/04/12 0352   PRN Meds:.acetaminophen, acetaminophen, ondansetron (ZOFRAN) IV, ondansetron Assessment/Plan: Joann Herrera is an 79 yo W with PMH of osteoarthritis status post bilateral knee replacement presents to the ED for cough x several months in duration and an episode of syncope.  Syncope: Ms.  Herrera reports history of passing out before she came to the ED yesterday. Physical examination does not reveal focal neurological deficits. However, she is dizzy on standing up. Examination of the ears is remarkable for the cerumen impaction but no features of ENT infection. She does not have nystagmus. Her blood pressure remained stable -110/48 in the lying position to 104/47 on standing. The pulse rate increased from 48-50. This excludes orthostasis. She has bradycardia. Review of her cardiac monitor reveals several  episodes of PVCs that were nonsustained. Differential diagnosis for the cause of his syncope is posterior circulation stroke versus cardiac from symptomatic bradycardia. She does not take any medications which can explain the bradycardia. Plan -Brain MRI with a neural consult if indicated. -Will pursue echocardiogram to evaluate the sinus bradycardia. If indicated a cardiology consult will be pursued as this is symptomatic bradycardia, if it can't be explained by the posterior circulation stroke. -She was given IV fluids 1.5 L and her blood pressure improved to 108/51. More IV fluids, can be administered if she continues to be hypotensive. She is currently on continuous infusion with 100 cc per hour. - continue with telemetry -PT, OT evaluation.  Presumed CAP (community acquired pneumonia): clinical presentation and CXR consistent with RLL PNA- community acquired. This is  likely bacterial but viral etiology is also on the differential given high influenza prevalence in the community. She denies symptoms of cough, shortness of breath. Influenza PCR is negative -Admit to telemetry  -Continue with Azithromycin and Rocephin. This is day 2 of therapy.  -Will continue with this treatment as we evaluate for the syncope. -Will repeat chest x-ray to evaluate the resolution of the presumed right lower lobe consolidation.  -Delsym for cough   RBBB and bradycardia: unclear etiology at this time. This is new on EKG today compare to 02/2004. Etiologies include chronically increased right ventricular pressure, as in cor pulmonale or PE, or MI/inflammation such as myocarditis. Other less common causes are HTN, cardiomyopathies. PE is unlikely as she is not hypoxic and her revised Geneva score is 1 for age which gives her low probability, therefore, will not get CTA of chest. Patient does not have HTN. Cardiac monitor revealed a slow heart rate of 45-50 overnight. The right bundle branch block is new compared to EKG  of 2005. Plan -Echocardiogram tomorrow. It has been ordered -She might require cardiology evaluation for this symptomatic bradycardia.  Abdominal pain: tenderness at epigastrium but this is chronic x many years. Lipase is 45 which makes pancreatitis unlikely. Other ddx include GERD, peptic ulcer disease, H pylori or constipation. She is not taking any NSAIDs or steroids at home. She states that she takes antacid but does not know the name. Will try protonix to see if it helps and she may need EGD as outpatient if she desires for further evaluation. She reports diarrhea but actually stool was well-formed in ED and on abd xray, there is moderate stool in right colon.  -Continue with Protonix 40mg  po qd  -Continue with Miralax 17g qd   DVT ppx: Lovenox SQ   Code Status: FULL code, I confirmed with patient in ED  Daughter Jasmine December: [phone]  Niece Boyd Kerbs [phone]   Dispo: Disposition is deferred at this time, awaiting improvement of current medical problems. Anticipated discharge in approximately 2-3 day(s).  The patient does not have a current PCP (No primary provider on file.), therefore will require [facility]PC follow-up after discharge.  The patient Does not have transportation limitations that hinder transportation to clinic appointments.      LOS: 1 day   Dow Adolph 05/04/2012, 2:39 PM

## 2012-05-04 NOTE — Progress Notes (Signed)
Pt BP at 0945 96/32. MD paged and ordered 500 cc NS bolus. Bolus given and BP 103/38. MD ordered second bolus of 1000 cc NS. BP rechecked now 108/51. Pt c/o of headache and feeling dizzy. MD ordered MRI. Will continue to monitor. Joann Herrera

## 2012-05-05 ENCOUNTER — Inpatient Hospital Stay (HOSPITAL_COMMUNITY): Payer: Medicare Other

## 2012-05-05 ENCOUNTER — Encounter (HOSPITAL_COMMUNITY): Payer: Self-pay | Admitting: Cardiology

## 2012-05-05 DIAGNOSIS — R55 Syncope and collapse: Secondary | ICD-10-CM

## 2012-05-05 DIAGNOSIS — I059 Rheumatic mitral valve disease, unspecified: Secondary | ICD-10-CM

## 2012-05-05 DIAGNOSIS — I452 Bifascicular block: Secondary | ICD-10-CM

## 2012-05-05 LAB — CBC
HCT: 30.9 % — ABNORMAL LOW (ref 36.0–46.0)
Hemoglobin: 10 g/dL — ABNORMAL LOW (ref 12.0–15.0)
MCH: 28.9 pg (ref 26.0–34.0)
MCHC: 32.4 g/dL (ref 30.0–36.0)

## 2012-05-05 LAB — BASIC METABOLIC PANEL
BUN: 10 mg/dL (ref 6–23)
Calcium: 8.4 mg/dL (ref 8.4–10.5)
GFR calc non Af Amer: 55 mL/min — ABNORMAL LOW (ref >90)
Glucose, Bld: 87 mg/dL (ref 70–99)

## 2012-05-05 NOTE — Progress Notes (Signed)
PT Cancellation Note  Patient Details Name: Joann Herrera MRN: [id number] DOB: Mar 02, 1924   Cancelled Treatment:    Reason Eval/Treat Not Completed: Patient at procedure or test/unavailable (Performing ECHO on pt on arrival.)   INGOLD,Favor Kreh 05/05/2012, 3:25 PM [facility] [phone] (pager)

## 2012-05-05 NOTE — Progress Notes (Signed)
  Echocardiogram 2D Echocardiogram has been performed.  Mccayla Shimada FRANCES 05/05/2012, 5:03 PM

## 2012-05-05 NOTE — Progress Notes (Signed)
Subjective: C/o not sleeping well in hospital, requesting "something to help", abdominal pain resolved, some bradycardia on telemetry 48-70 bpm, 1st degree heart block  Objective: Vital signs in last 24 hours: Filed Vitals:   05/04/12 1147 05/04/12 1339 05/04/12 1941 05/05/12 0440  BP: 108/51 100/46 107/45 116/35  Pulse: 51 65 64 61  Temp:  97.3 F (36.3 C) 98.7 F (37.1 C) 97.6 F (36.4 C)  TempSrc:  Oral Oral Oral  Resp:  18 16 18   Height:      Weight:    142 lb 14.4 oz (64.819 kg)  SpO2:  96% 96% 95%   Weight change: 9 lb 15.4 oz (4.519 kg)  Intake/Output Summary (Last 24 hours) at 05/05/12 0754 Last data filed at 05/05/12 0600  Gross per 24 hour  Intake   4080 ml  Output   2750 ml  Net   1330 ml   Physical Exam: General: Well-developed, well-nourished, elderly WF, in no acute distress; resting in bed Head: Normocephalic, atraumatic. Eyes: PERRLA, EOMI, No signs of anemia or jaundice. Nose: Moist mucous membranes, no purulence, normal turbinates Lungs: Normal respiratory effort. Clear to auscultation bilaterally from apices to bases without crackles or wheezes appreciated. Heart: distant heart sounds,normal rate, no gallop, murmur, or rubs appreciated. Abdomen: BS normoactive. Soft, Nondistended, non-tender. No masses or organomegaly appreciated. Extremities: No pretibial edema, distal pulses intact Neurologic: grossly non-focal, alert and oriented x3, appropriate and cooperative throughout examination.    Lab Results: Basic Metabolic Panel:  Lab 05/05/12 9604 05/04/12 0425  NA 141 139  K 4.1 3.9  CL 111 109  CO2 20 21  GLUCOSE 87 93  BUN 10 11  CREATININE 0.92 0.90  CALCIUM 8.4 8.3*  MG -- --  PHOS -- --   Liver Function Tests:  Lab 05/03/12 0720  AST 11  ALT 6  ALKPHOS 63  BILITOT 0.2*  PROT 6.3  ALBUMIN 3.3*    Lab 05/03/12 0720  LIPASE 45  AMYLASE --   CBC:  Lab 05/05/12 0455 05/04/12 0425 05/03/12 0720  WBC 4.9 5.6 --  NEUTROABS -- --  5.3  HGB 10.0* 10.0* --  HCT 30.9* 30.8* --  MCV 89.3 88.5 --  PLT 100* 174 --    Urinalysis:  Lab 05/03/12 1456  COLORURINE YELLOW  LABSPEC 1.008  PHURINE 7.5  GLUCOSEU NEGATIVE  HGBUR NEGATIVE  BILIRUBINUR NEGATIVE  KETONESUR NEGATIVE  PROTEINUR NEGATIVE  UROBILINOGEN 0.2  NITRITE NEGATIVE  LEUKOCYTESUR NEGATIVE     Micro Results: Recent Results (from the past 240 hour(s))  URINE CULTURE     Status: Normal   Collection Time   05/03/12  2:56 PM      Component Value Range Status Comment   Specimen Description URINE, CLEAN CATCH   Final    Special Requests NONE   Final    Culture  Setup Time 05/03/2012 16:05   Final    Colony Count NO GROWTH   Final    Culture NO GROWTH   Final    Report Status 05/04/2012 FINAL   Final    Studies/Results: Mr Laqueta Jean VW Contrast  05/04/2012  *RADIOLOGY REPORT*  Clinical Data: 79 year old female with new onset dizziness. Syncope.  Evaluate posterior circulation infarct. Confusion.  MRI HEAD WITHOUT AND WITH CONTRAST  Technique:  Multiplanar, multiecho pulse sequences of the brain and surrounding structures were obtained according to standard protocol without and with intravenous contrast  Contrast: 15mL MULTIHANCE GADOBENATE DIMEGLUMINE 529 MG/ML IV SOLN  Comparison: None.  Findings: No restricted diffusion to suggest acute infarction.  No midline shift, mass effect, evidence of mass lesion, ventriculomegaly, extra-axial collection or acute intracranial hemorrhage.  Cervicomedullary junction and pituitary are within normal limits.  Major intracranial vascular flow voids are preserved.  Mild for age periventricular and other scattered cerebral white matter T2 and FLAIR hyperintensity, acute significance doubtful. Tiny chronic lacunar infarcts or more likely dilated perivascular spaces in the right basal ganglia.  Deep gray matter nuclei, brainstem, and cerebellum otherwise within normal limits.  Negative visualized cervical spine.  Normal bone marrow  signal. No abnormal enhancement identified.  Postoperative changes to the globes.  Incidental right parotid gland atrophy.  Mild paranasal sinus mucosal thickening.  Mastoids are clear.  Grossly normal visualized internal auditory structures. Negative scalp soft tissues.  IMPRESSION: No acute intracranial abnormality. Largely unremarkable for age MRI appearance of the brain.   Original Report Authenticated By: Erskine Speed, M.D.    Dg Abd Acute W/chest  05/03/2012  *RADIOLOGY REPORT*  Clinical Data: Abdominal pain, vomiting  ACUTE ABDOMEN SERIES (ABDOMEN 2 VIEW & CHEST 1 VIEW)  Comparison: Chest radiographs dated 03/01/2004  Findings: Mild patchy right lower lobe opacity, atelectasis versus pneumonia. No pleural effusion or pneumothorax.  The heart is normal in size.  Nonobstructive bowel gas pattern.  No evidence of free air on the lateral decubitus view.  Moderate stool in the right colon.  Degenerative changes of the visualized thoracolumbar spine.  IMPRESSION: Mild patchy right lower lobe opacity, atelectasis versus pneumonia.  No evidence of small bowel obstruction or free air.  Moderate stool in the right colon.   Original Report Authenticated By: Charline Bills, M.D.    Medications: I have reviewed the patient's current medications. Scheduled Meds:    . azithromycin  500 mg Oral Daily  . cefTRIAXone (ROCEPHIN)  IV  1 g Intravenous Q24H  . dextromethorphan  15 mg Oral BID  . enoxaparin (LOVENOX) injection  40 mg Subcutaneous Q24H  . pantoprazole  40 mg Oral Daily  . polyethylene glycol  17 g Oral Daily   Continuous Infusions:    . sodium chloride 100 mL/hr at 05/05/12 0537   PRN Meds:.acetaminophen, acetaminophen, ondansetron (ZOFRAN) IV, ondansetron  Assessment/Plan: Ms. Seals is an 79 yo with unclear medical history secondary to lack of medical care over many years admitted with concern for syncope and/or dizziness in setting of new incomplete right bundle branch block and  bradycardia.  1. Syncope/dizziness: in setting of new since 2005 incomplete RBBB and bradycardia (as low as 47 bpm), MRI without acute intracranial abnormality, reports history of inner ear problems though her symptoms are not completely consistent with vertigo,  continued bradycardia (55 bpm)  and incomplete RBBB on EKG this morning , on no antihypertensive, maintaining appropriate bp on IVF 100 cc/h, awaiting TTE to assess for right ventricular hypertrophy and other cardiomyopathies, no evidence of ischemia or PE, could be secondary to degenerative disease of her conduction system and Sick Sinus Syndrome (Stokes-Adams attack);  -awaiting TTE -consult Cardiology for further evaluation   2.  Small right lower lobe community acquired pneumonia: denies cough, shortness of breath. Continues to be afebrile without leukocytosis, soft bp improving with IVF, hypothermia noted in the ED resolved.  Influenza PCR negative, Strep pneu negative.  Repeat CXR with small RLL airspace disease. Day #3 Azithromycin and Rocephin -Continue current antibiotics with plan to transition to oral regimen  Lab 05/05/12 0455 05/04/12 0425 05/03/12 1310 05/03/12 0720  WBC 4.9 5.6 7.0  8.2    3. Chronic Abdominal pain:  Likely secondary to GERD and constipation. Currently without complaints of abdominal pain. Pt reported diarrhea but stool was well-formed in ED, abd xray with moderate stool in right colon.  -Continue with Protonix 40mg  po qd  -Continue with Miralax 17g qd   4. DVT ppx: Lovenox SQ   5. Code Status: FULL code  Dispo: Disposition is deferred at this time, awaiting evaluation by Cardiology. Anticipate discharge in next 1-2 days. -will have PT/OT eval for home needs  The patient does not have a current PCP (No primary provider on file.), therefore will require follow-up after discharge.  The patient does not have transportation limitations that hinder transportation to clinic appointments.     LOS: 2 days     Monterrio Gerst 05/05/2012, 7:54 AM

## 2012-05-05 NOTE — Consult Note (Signed)
Clinical Summary Joann Herrera is an 79 y.o.female admitted to the hospital after an episode of weakness, abdominal pain, and syncope - unwitnessed at home. She states that she awoke at 5 AM on Wednesday morning feeling "bad," specifically describing feeling of wooziness and as if "things were moving" when she would look in different directions. She got up and went to the kitchen to get something to eat. She felt dizzy, also complaining of abdominal pain with nausea. She states that she had an episode of emesis, pressed her life alert monitor, and then reportedly passed out. She states that she continued to be very weak, was taken to the ER, and continued to have intermittent abdominal pains that have now subsided. She still feels weak, states that she has difficulty walking, although she has been able to eat. Does have frequent diarrhea.  We are consulted secondary to abnormal ECG and bradycardia. Review of telemetry shows sinus bradycardia as slow as the 40s to 50s, no significant pauses or tachyarrhythmias however. Her ECG shows right bundle branch block of uncertain duration, normal PR interval. Point-of-care troponin was normal. Brain MRI reports no acute abnormalities. Films of the chest and abdomen to suggest possible right lower lobe pneumonia, no obvious acute abdominal process with moderate amount of stool in the right colon.  Joann Herrera denies any obvious history of CAD, reports no angina symptoms. Does state that she has frequent heartburn, sometimes difficulty swallowing foods. She reports mild episodes of "inner ear problems" over time, but nothing as significant as her most recent presentation.  Allergies  Allergen Reactions  . Aspirin Other (See Comments)    Unable to say  . Codeine Other (See Comments)    Unable to say  . Morphine And Related Other (See Comments)    Unable to say  . Penicillins Other (See Comments)    Unable to say    Medications    . azithromycin  500 mg Oral  Daily  . cefTRIAXone (ROCEPHIN)  IV  1 g Intravenous Q24H  . dextromethorphan  15 mg Oral BID  . enoxaparin (LOVENOX) injection  40 mg Subcutaneous Q24H  . pantoprazole  40 mg Oral Daily  . polyethylene glycol  17 g Oral Daily    Past Medical History  Diagnosis Date  . Arthritis   . Pneumonia 04/2012  . Macular degeneration     Past Surgical History  Procedure Date  . Total knee arthroplasty     x 2  . Hemorrhoid surgery   . Cataracts     History reviewed. No pertinent family history.  Social History Joann Herrera reports that she has been smoking Cigarettes.  She has a 40 pack-year smoking history. She has never used smokeless tobacco. Joann Herrera reports that she does not drink alcohol.  Review of Systems No fevers or chills. No orthopnea or PND. Otherwise as detailed above.  Physical Examination Blood pressure 116/35, pulse 61, temperature 97.6 F (36.4 C), temperature source Oral, resp. rate 18, height 5\' 5"  (1.651 m), weight 142 lb 14.4 oz (64.819 kg), SpO2 95.00%. Weight change: 9 lb 15.4 oz (4.519 kg)  No acute distress. HEENT: Conjunctiva and lids normal, oropharynx clear. Neck: Supple, no elevated JVP or carotid bruits, no thyromegaly. Lungs: Clear to auscultation, nonlabored breathing at rest. Cardiac: Regular rate and rhythm, no S3, soft systolic murmur, no pericardial rub. Abdomen: Soft, nontender, decreased but present bowel sounds, no guarding or rebound. Extremities: No pitting edema, distal pulses 2+. Skin: Warm and dry.  Musculoskeletal: No kyphosis. Neuropsychiatric: Alert and oriented x3, affect grossly appropriate.  Lab Results Basic Metabolic Panel:  Lab 05/05/12 2130 05/04/12 0425 05/03/12 1310 05/03/12 0720  NA 141 139 -- 141  K 4.1 3.9 -- --  CL 111 109 -- 107  CO2 20 21 -- 22  GLUCOSE 87 93 -- 137*  BUN 10 11 -- 14  CREATININE 0.92 0.90 0.77 0.80  CALCIUM 8.4 8.3* -- 8.5  MG -- -- -- --  PHOS -- -- -- --   Liver Function Tests:  Lab  05/03/12 0720  AST 11  ALT 6  ALKPHOS 63  BILITOT 0.2*  PROT 6.3  ALBUMIN 3.3*    Lab 05/03/12 0720  LIPASE 45  AMYLASE --   CBC:  Lab 05/05/12 0455 05/04/12 0425 05/03/12 1310 05/03/12 0720  WBC 4.9 5.6 7.0 8.2  NEUTROABS -- -- -- 5.3  HGB 10.0* 10.0* 10.8* 11.8*  HCT 30.9* 30.8* 33.1* 36.0  MCV 89.3 88.5 88.0 88.0  PLT 100* 174 154 PLATELET CLUMPS NOTED ON SMEAR, UNABLE TO ESTIMATE    Impression   1. Episode of syncope, unwitnessed at home following initial symptoms of dizziness, abdominal pain, nausea and emesis. Although etiology is not certain, possibility of a neurocardiogenic event over baseline bradycardia is to be considered. She does have bradycardia on monitoring, however this is not symptomatic at present, she has had no pauses. ECG does show right bundle branch block, possibly has some component of underlying conduction system disease. She is not on any medications that should be contributing to bradycardia at this time. Echocardiogram is pending. Brain MRI did not suggest acute event.  2. Right lower lobe community-acquired pneumonia, currently on antibiotics.  3. Abdominal pain, reported intermittent diarrhea, heartburn, and occasional dysphasia. Etiology not certain. Did have moderate amount of stool in the right colon by plain films. She is currently on PPI.  Recommendations  Followup on echocardiogram to assess cardiac structure and function. Check TSH. Check orthostatic vital signs as she continues to report weakness when she stands and tries to walk. Avoid medications that would further precipitate bradycardia or pauses. Consider investigating abdominal symptoms/nausea and emesis, as these could be precipitants for neurocardiogenic hypersensitivity that could lead to syncope. At this point there is no indication for pacemaker, no documentation of clearly symptomatic bradycardia or pauses. It would be worth considering longer term outpatient cardiac monitoring  after discharge if no other etiology is determined.  Jonelle Sidle, M.D., F.A.C.C.

## 2012-05-06 LAB — GLUCOSE, CAPILLARY: Glucose-Capillary: 75 mg/dL (ref 70–99)

## 2012-05-06 LAB — CBC
MCH: 29.1 pg (ref 26.0–34.0)
MCV: 87.6 fL (ref 78.0–100.0)
Platelets: 78 10*3/uL — ABNORMAL LOW (ref 150–400)
RBC: 3.54 MIL/uL — ABNORMAL LOW (ref 3.87–5.11)
RDW: 13.9 % (ref 11.5–15.5)

## 2012-05-06 LAB — BASIC METABOLIC PANEL
BUN: 9 mg/dL (ref 6–23)
CO2: 20 mEq/L (ref 19–32)
Calcium: 8.4 mg/dL (ref 8.4–10.5)
Creatinine, Ser: 0.88 mg/dL (ref 0.50–1.10)

## 2012-05-06 LAB — TSH: TSH: 2.079 u[IU]/mL (ref 0.350–4.500)

## 2012-05-06 NOTE — Evaluation (Signed)
Physical Therapy Evaluation Patient Details Name: Joann Herrera MRN: [id number] DOB: 10/05/1923 Today's Date: 05/06/2012 Time: 4098-1191 PT Time Calculation (min): 40 min  PT Assessment / Plan / Recommendation Clinical Impression  Patient is an 79 yo female admitted with CAP, bradycardia, hypotensive, syncope/dizziness.  Patient reports she has macular degeneration and cannot focus on objects (cannot see features of therapist's face).  Also reports she has "wax in her ears" with pain behind ears and down neck.  Modified oculomotor exam - no nystagmus noted, with dizziness at 2/5.  Modified Hallpike to both sides with no nystagmus or increase in dizziness.  Noted increase in dizziness to 3/5 with VOR testing - turning head side to side in sitting.  Patient also reports increase in dizziness with gait at 3/5.  Provided patient with gaze stabilization/x1 exercises.  With balance impacted by dizziness, do not feel patient is safe to be alone.  Recommend ST-SNF for continued therapy at discharge.    PT Assessment  Patient needs continued PT services    Follow Up Recommendations  SNF;Supervision/Assistance - 24 hour    Does the patient have the potential to tolerate intense rehabilitation      Barriers to Discharge Decreased caregiver support      Equipment Recommendations  None recommended by PT    Recommendations for Other Services     Frequency Min 3X/week    Precautions / Restrictions Precautions Precautions: Fall Restrictions Weight Bearing Restrictions: No   Pertinent Vitals/Pain       Mobility  Bed Mobility Bed Mobility: Supine to Sit;Sitting - Scoot to Edge of Bed;Sit to Supine Supine to Sit: 4: Min guard;With rails Sitting - Scoot to Edge of Bed: 4: Min guard Sit to Supine: 4: Min guard;With rail Details for Bed Mobility Assistance: Verbal cues for technique.  Assist for safety only.  Transfers Transfers: Sit to Stand;Stand to Sit Sit to Stand: 4: Min guard;With  upper extremity assist;From bed Stand to Sit: 4: Min guard;With upper extremity assist;To bed Details for Transfer Assistance: Verbal cues for hand placement.  Assist for safety/balance. Ambulation/Gait Ambulation/Gait Assistance: 4: Min assist Ambulation Distance (Feet): 88 Feet Assistive device: None;Rolling walker Ambulation/Gait Assistance Details: Patient ambulated 62' with no assistive device.  Patient with staggering gait due to dizziness, requiring assist to maintain upright position.  Ambulated 33' with RW with improved balance.  Patient reports objects are "moving in front of me". Gait Pattern: Step-through pattern;Decreased stride length;Trunk flexed Gait velocity: Slow gait speed.       Exercises Other Exercises Other Exercises: Focus on target object to decrease dizziness. Other Exercises: x1 exercises - able to do in sitting x 15 seconds   PT Diagnosis: Abnormality of gait;Generalized weakness (Dizziness)  PT Problem List: Decreased strength;Decreased activity tolerance;Decreased balance;Decreased mobility;Decreased knowledge of use of DME (Dizziness) PT Treatment Interventions: DME instruction;Gait training;Functional mobility training;Therapeutic exercise;Balance training;Patient/family education (Vestibular Rehab)   PT Goals Acute Rehab PT Goals PT Goal Formulation: With patient Time For Goal Achievement: 05/13/12 Potential to Achieve Goals: Good Pt will go Sit to Stand: with modified independence;with upper extremity assist PT Goal: Sit to Stand - Progress: Goal set today Pt will Ambulate: >150 feet;with modified independence;with least restrictive assistive device PT Goal: Ambulate - Progress: Goal set today Pt will Perform Home Exercise Program: Independently PT Goal: Perform Home Exercise Program - Progress: Goal set today Additional Goals Additional Goal #1: Patient will complete above goals with dizziness 1/5 or less PT Goal: Additional Goal #1 - Progress: Goal  set today  Visit Information  Last PT Received On: 05/06/12 Assistance Needed: +1    Subjective Data  Subjective: "I'm dizzy when I turn my head"   Patient Stated Goal: To get back to normal   Prior Functioning  Home Living Lives With: Alone Available Help at Discharge: Family;Available PRN/intermittently Type of Home: House Home Access: Stairs to enter [employer] of Steps: 2 Entrance Stairs-Rails: Right;Left Home Layout: One level Firefighter: Standard Bathroom Accessibility: Yes How Accessible: Accessible via walker Home Adaptive Equipment: Bedside commode/3-in-1;Shower chair with back;Walker - rolling;Straight cane;Wheelchair - manual Prior Function Level of Independence: Independent Able to Take Stairs?: Yes Driving: No Vocation: Retired Comments: Patient was climbing into attic last week. Communication Communication: No difficulties (Patient reports "wax in ears")    Cognition  Overall Cognitive Status: Appears within functional limits for tasks assessed/performed Arousal/Alertness: Awake/alert Orientation Level: Appears intact for tasks assessed Behavior During Session: [facility] for tasks performed    Extremity/Trunk Assessment Right Upper Extremity Assessment RUE ROM/Strength/Tone: WFL for tasks assessed RUE Sensation: WFL - Light Touch Left Upper Extremity Assessment LUE ROM/Strength/Tone: WFL for tasks assessed LUE Sensation: WFL - Light Touch Right Lower Extremity Assessment RLE ROM/Strength/Tone: Deficits RLE ROM/Strength/Tone Deficits: General weakness - 4/5 RLE Sensation: WFL - Light Touch RLE Coordination: WFL - gross motor Left Lower Extremity Assessment LLE ROM/Strength/Tone: Deficits LLE ROM/Strength/Tone Deficits: General weakness 4/5 LLE Sensation: WFL - Light Touch LLE Coordination: WFL - gross motor   Balance Balance Balance Assessed: Yes Static Sitting Balance Static Sitting - Balance Support: Right upper extremity  supported;Feet supported Static Sitting - Level of Assistance: 5: Stand by assistance Static Sitting - Comment/# of Minutes: Patient able to maintain upright posture with good balance in static sitting x 4 minutes.  End of Session PT - End of Session Equipment Utilized During Treatment: Gait belt Activity Tolerance: Patient limited by fatigue;Treatment limited secondary to medical complications (Comment) (Limited by dizziness) Patient left: in bed;with call bell/phone within reach;with family/visitor present Nurse Communication: Mobility status  GP     Vena Austria 05/06/2012, 3:22 PM  Durenda Hurt. Renaldo Fiddler, [facility] Acute Rehab Services Pager [phone]

## 2012-05-06 NOTE — Progress Notes (Signed)
   Consulting cardiologist: Dr. Nona Dell  Subjective:   Feels somewhat better this morning. No frank nausea. Has not ambulated yet. No dizziness.   Objective:   Temp:  [98 F (36.7 C)] 98 F (36.7 C) (01/12 0400) Pulse Rate:  [56-80] 80  (01/12 0400) Resp:  [18] 18  (01/12 0400) BP: (119-151)/(56-71) 119/56 mmHg (01/12 0400) SpO2:  [95 %-97 %] 97 % (01/12 0400) Weight:  [142 lb 6.4 oz (64.592 kg)] 142 lb 6.4 oz (64.592 kg) (01/12 0254) Last BM Date: 05/04/12  Filed Weights   05/04/12 0647 05/05/12 0440 05/06/12 0254  Weight: 136 lb 14.5 oz (62.1 kg) 142 lb 14.4 oz (64.819 kg) 142 lb 6.4 oz (64.592 kg)    Intake/Output Summary (Last 24 hours) at 05/06/12 0813 Last data filed at 05/06/12 0300  Gross per 24 hour  Intake 1813.34 ml  Output   1753 ml  Net  60.34 ml   Telemetry: Sinus rhythm and sinus bradycardia without pauses.  Exam:  General: NAD.  Lungs: Nonlabored, clear.  Cardiac: RRR, no gallop, soft systolic murmur.  Extremities: No pitting.  Lab Results:  Basic Metabolic Panel:  Lab 05/05/12 4098 05/04/12 0425 05/03/12 1310 05/03/12 0720  NA 141 139 -- 141  K 4.1 3.9 -- 3.6  CL 111 109 -- 107  CO2 20 21 -- 22  GLUCOSE 87 93 -- 137*  BUN 10 11 -- 14  CREATININE 0.92 0.90 0.77 --  CALCIUM 8.4 8.3* -- 8.5  MG -- -- -- --    CBC:  Lab 05/06/12 0635 05/05/12 0455 05/04/12 0425  WBC 5.6 4.9 5.6  HGB 10.3* 10.0* 10.0*  HCT 31.0* 30.9* 30.8*  MCV 87.6 89.3 88.5  PLT 78* 100* 174   TSH pending   Medications:   Scheduled Medications:    . azithromycin  500 mg Oral Daily  . cefTRIAXone (ROCEPHIN)  IV  1 g Intravenous Q24H  . dextromethorphan  15 mg Oral BID  . enoxaparin (LOVENOX) injection  40 mg Subcutaneous Q24H  . pantoprazole  40 mg Oral Daily  . polyethylene glycol  17 g Oral Daily     Infusions:    . sodium chloride 100 mL/hr at 05/06/12 0355     PRN Medications:  acetaminophen, acetaminophen, ondansetron (ZOFRAN) IV,  ondansetron   Assessment:   1. Episode of syncope, unwitnessed at home following initial symptoms of dizziness, abdominal pain, nausea and emesis. Although etiology is not certain, possibility of a neurocardiogenic event over baseline bradycardia is to be considered. Telemetry shows sinus bradycardia, mainly in the low 50s to around 60, no pauses. She is not symptomatic at these heart rates. Followup TSH and echocardiogram are pending. She is on no specific offending medications.  2. Right lower lobe community-acquired pneumonia, currently on antibiotics per primary team.  3. Intermittent abdominal pain and nausea. Etiology not certain. Does have history of heartburn, on PPI.   Plan/Discussion:    Followup echocardiogram and TSH. Will ask to have patient ambulate in the hall with assistance. Does not look to have significant orthostatic blood pressure change based on vtal signs review. If she is ultimately discharged over the weekend, please contact our PA-C on-call so that a 14 day outpatient cardiac monitor can be arranged, and she can have followup in our [place] office.   Jonelle Sidle, M.D., F.A.C.C.

## 2012-05-06 NOTE — Progress Notes (Signed)
Subjective: Up frequently during the night to urinate, request IVF be turned down as she is drinking fluids. Denies any syncope. Anxious to leave hospital but per nursing still wobbly on her feet. Sinus brady on tele overnight, rate 49-60, no other events. Denies CP, SOB, N/V, dizziness, abdominal pain, diarrhea.  Objective: Vital signs in last 24 hours: Filed Vitals:   05/05/12 1521 05/05/12 2010 05/06/12 0254 05/06/12 0400  BP: 140/67 133/66  119/56  Pulse: 64 56  80  Temp:  98 F (36.7 C)  98 F (36.7 C)  TempSrc:  Oral  Oral  Resp:  18  18  Height:      Weight:   142 lb 6.4 oz (64.592 kg)   SpO2:  96%  97%   Weight change: -8 oz (-0.227 kg)  Intake/Output Summary (Last 24 hours) at 05/06/12 0946 Last data filed at 05/06/12 0300  Gross per 24 hour  Intake 1573.34 ml  Output   1552 ml  Net  21.34 ml   Physical Exam: General: Well-developed, well-nourished, elderly WF, in no acute distress; resting in bed Head: Normocephalic, atraumatic. Eyes: PERRLA, EOMI, No signs of anemia or jaundice. Lungs: Normal respiratory effort. Clear to auscultation bilaterally, no wheezing appreciated Heart: regular rate (60) and rhythm, no m/r/g Abdomen: BS normoactive. Soft, Nondistended, non-tender. No masses or organomegaly appreciated. Extremities: No pretibial edema, distal pulses intact Neurologic: A little unbalanced on transfer to commode. Grossly non-focal, alert and oriented x3, appropriate and cooperative throughout examination.  Lab Results: Basic Metabolic Panel:  Lab 05/06/12 1610 05/05/12 0455  NA 141 141  K 3.8 4.1  CL 109 111  CO2 20 20  GLUCOSE 89 87  BUN 9 10  CREATININE 0.88 0.92  CALCIUM 8.4 8.4  MG -- --  PHOS -- --   Liver Function Tests:  Lab 05/03/12 0720  AST 11  ALT 6  ALKPHOS 63  BILITOT 0.2*  PROT 6.3  ALBUMIN 3.3*    Lab 05/03/12 0720  LIPASE 45  AMYLASE --   CBC:  Lab 05/06/12 0635 05/05/12 0455 05/03/12 0720  WBC 5.6 4.9 --    NEUTROABS -- -- 5.3  HGB 10.3* 10.0* --  HCT 31.0* 30.9* --  MCV 87.6 89.3 --  PLT 78* 100* --    Urinalysis:  Lab 05/03/12 1456  COLORURINE YELLOW  LABSPEC 1.008  PHURINE 7.5  GLUCOSEU NEGATIVE  HGBUR NEGATIVE  BILIRUBINUR NEGATIVE  KETONESUR NEGATIVE  PROTEINUR NEGATIVE  UROBILINOGEN 0.2  NITRITE NEGATIVE  LEUKOCYTESUR NEGATIVE     Micro Results: Recent Results (from the past 240 hour(s))  URINE CULTURE     Status: Normal   Collection Time   05/03/12  2:56 PM      Component Value Range Status Comment   Specimen Description URINE, CLEAN CATCH   Final    Special Requests NONE   Final    Culture  Setup Time 05/03/2012 16:05   Final    Colony Count NO GROWTH   Final    Culture NO GROWTH   Final    Report Status 05/04/2012 FINAL   Final    Studies/Results: Dg Chest 2 View  05/05/2012  *RADIOLOGY REPORT*  Clinical Data: Pneumonia.  Cough.  Short of breath.  CHEST - 2 VIEW  Comparison: 05/03/2012.  Findings: Small focus patchy right lower lobe airspace density is present, which may represent aspiration pneumonitis or pneumonia. Subsegmental atelectasis at the right lung base is also present.  The cardiopericardial silhouette is upper  limits of normal size for projection. Aortic arch atherosclerosis.  Mild atelectasis of the left lung base.  There is no pleural effusion.  IMPRESSION: Small focus of right lower lobe airspace disease, likely representing pneumonia or aspiration pneumonitis.  Borderline cardiomegaly.   Original Report Authenticated By: Andreas Newport, M.D.    Mr Laqueta Jean Wo Contrast  05/04/2012  *RADIOLOGY REPORT*  Clinical Data: 79 year old female with new onset dizziness. Syncope.  Evaluate posterior circulation infarct. Confusion.  MRI HEAD WITHOUT AND WITH CONTRAST  Technique:  Multiplanar, multiecho pulse sequences of the brain and surrounding structures were obtained according to standard protocol without and with intravenous contrast  Contrast: 15mL MULTIHANCE  GADOBENATE DIMEGLUMINE 529 MG/ML IV SOLN  Comparison: None.  Findings: No restricted diffusion to suggest acute infarction.  No midline shift, mass effect, evidence of mass lesion, ventriculomegaly, extra-axial collection or acute intracranial hemorrhage.  Cervicomedullary junction and pituitary are within normal limits.  Major intracranial vascular flow voids are preserved.  Mild for age periventricular and other scattered cerebral white matter T2 and FLAIR hyperintensity, acute significance doubtful. Tiny chronic lacunar infarcts or more likely dilated perivascular spaces in the right basal ganglia.  Deep gray matter nuclei, brainstem, and cerebellum otherwise within normal limits.  Negative visualized cervical spine.  Normal bone marrow signal. No abnormal enhancement identified.  Postoperative changes to the globes.  Incidental right parotid gland atrophy.  Mild paranasal sinus mucosal thickening.  Mastoids are clear.  Grossly normal visualized internal auditory structures. Negative scalp soft tissues.  IMPRESSION: No acute intracranial abnormality. Largely unremarkable for age MRI appearance of the brain.   Original Report Authenticated By: Erskine Speed, M.D.    Medications: I have reviewed the patient's current medications. Scheduled Meds:    . azithromycin  500 mg Oral Daily  . cefTRIAXone (ROCEPHIN)  IV  1 g Intravenous Q24H  . dextromethorphan  15 mg Oral BID  . enoxaparin (LOVENOX) injection  40 mg Subcutaneous Q24H  . pantoprazole  40 mg Oral Daily  . polyethylene glycol  17 g Oral Daily   Continuous Infusions:    . sodium chloride 100 mL/hr at 05/06/12 0355   PRN Meds:.acetaminophen, acetaminophen, ondansetron (ZOFRAN) IV, ondansetron  Assessment/Plan: Ms. Woodfin is an 79 yo with unclear medical history secondary to lack of medical care over many years admitted with concern for syncope and/or dizziness in setting of new incomplete right bundle branch block and bradycardia.  1.  Syncope/dizziness: in setting of new since 2005 incomplete RBBB and bradycardia (as low as 47 bpm), MRI without acute intracranial abnormality, reports history of inner ear problems though her symptoms are not completely consistent with vertigo,  continued bradycardia (nadir 49 bpm overnight, no pauses)  and incomplete RBBB. Awaiting TTE interpretation to assess for right ventricular hypertrophy and other cardiomyopathies, no evidence of ischemia or PE, could be secondary to degenerative disease of her conduction system and Sick Sinus Syndrome (Stokes-Adams attack). No episodes of hypotension overnight, asking IVF to be turned off as frequent urination and good po fluid intake.  -awaiting TTE - cardiology following - will saline lock IVF for now, restart if hypotensive   2.  Small right lower lobe community acquired pneumonia: denies cough, shortness of breath. Continues to be afebrile without leukocytosis, soft bp improving with IVF, hypothermia noted in the ED resolved.  Influenza PCR negative, Strep pneu negative.  Repeat CXR with small RLL airspace disease. Day #4 Azithromycin and Rocephin -Continue current antibiotics with plan to transition to oral  regimen  Lab 05/06/12 0635 05/05/12 0455 05/04/12 0425 05/03/12 1310 05/03/12 0720  WBC 5.6 4.9 5.6 7.0 8.2    3. Chronic Abdominal pain:  Likely secondary to GERD and constipation. Currently without complaints of abdominal pain. Pt reported diarrhea but stool was well-formed in ED, abd xray with moderate stool in right colon.  -Continue with Protonix 40mg  po qd  -Continue with Miralax 17g qd  4. Thrombocytopenia Platelet count decreasing 154-->100-->78 today. No evidence of active bleeding, Hb stable.  - Will continue to monitor Lab Results  Component Value Date   PLT 78* 05/06/2012     5. DVT ppx: Lovenox SQ   6. Code Status: FULL code  Dispo: Disposition is deferred at this time, awaiting evaluation by Cardiology. Anticipate discharge  in next 1 day. -will have PT/OT eval for home needs  The patient does not have a current PCP (No primary provider on file.), therefore will require follow-up after discharge.  The patient does not have transportation limitations that hinder transportation to clinic appointments.     LOS: 3 days   Bronson Curb 05/06/2012, 9:46 AM

## 2012-05-07 LAB — CBC
Hemoglobin: 11.3 g/dL — ABNORMAL LOW (ref 12.0–15.0)
MCHC: 33.2 g/dL (ref 30.0–36.0)
Platelets: 160 10*3/uL (ref 150–400)
RDW: 14 % (ref 11.5–15.5)

## 2012-05-07 LAB — BASIC METABOLIC PANEL
Calcium: 8.9 mg/dL (ref 8.4–10.5)
GFR calc Af Amer: 64 mL/min — ABNORMAL LOW (ref >90)
GFR calc non Af Amer: 55 mL/min — ABNORMAL LOW (ref >90)
Potassium: 3.9 mEq/L (ref 3.5–5.1)
Sodium: 142 mEq/L (ref 135–145)

## 2012-05-07 MED ORDER — POLYETHYLENE GLYCOL 3350 17 G PO PACK: 17.0000 g | PACK | Freq: Every day | ORAL | Status: DC

## 2012-05-07 MED ORDER — PANTOPRAZOLE SODIUM 40 MG PO TBEC: 40.0000 mg | DELAYED_RELEASE_TABLET | Freq: Every day | ORAL | Status: DC

## 2012-05-07 MED ORDER — LEVOFLOXACIN 500 MG PO TABS
500.0000 mg | ORAL_TABLET | Freq: Every day | ORAL | Status: DC
  Administered 2012-05-07: 500 mg via ORAL
  Filled 2012-05-07: qty 1

## 2012-05-07 NOTE — Progress Notes (Addendum)
Occupational Therapy Evaluation Patient Details Name: Joann Herrera MRN: [id number] DOB: 06-15-1923 Today's Date: 05/07/2012 Time: 4098-1191 OT Time Calculation (min): 23 min  OT Assessment / Plan / Recommendation Clinical Impression  79 yo with CAP, bradydardia, hypotensive, syncope/dizziness. Pt has Macular Degeneration. PTA, pt lived independently alone. Pt able to D/C home at this time @ RW level with 24/7 S of family. Pt will need HHOT/PT at D/C to return pt to PLOF. Pt reports an improvement in dizziness. Discssed D/C plans with MD.    OT Assessment  All further OT needs can be met in the next venue of care    Follow Up Recommendations  Home health OT;Supervision/Assistance - 24 hour    Barriers to Discharge  none    Equipment Recommendations  None recommended by OT    Recommendations for Other Services  SW consult for community resources for pt  Frequency    eval only   Precautions / Restrictions Precautions Precautions: Fall   Pertinent Vitals/Pain No c/o pain    ADL  Eating/Feeding: Independent Where Assessed - Eating/Feeding: Chair Grooming: Supervision/safety;Set up Where Assessed - Grooming: Supported standing Upper Body Bathing: Set up;Supervision/safety Where Assessed - Upper Body Bathing: Supported sitting Lower Body Bathing: Set up;Supervision/safety Where Assessed - Lower Body Bathing: Supported sit to stand Upper Body Dressing: Supervision/safety;Set up Where Assessed - Upper Body Dressing: Supported sitting Lower Body Dressing: Supervision/safety;Set up Where Assessed - Lower Body Dressing: Supported sit to stand Toilet Transfer: Radiographer, therapeutic Method: Stand pivot;Sit to Barista: Comfort height toilet Toileting - Clothing Manipulation and Hygiene: Supervision/safety Where Assessed - Toileting Clothing Manipulation and Hygiene: Sit to stand from 3-in-1 or toilet;Standing Equipment Used: Gait belt;Rolling  walker Transfers/Ambulation Related to ADLs: S @ RW level ADL Comments: Completed basic functional activites @ RW level. min c.o diziness only with head turns. Educated pton gaze stabilizatin and eliminating quick head turns    OT Diagnosis: Other (comment) (balance deficits)  OT Problem List: Decreased strength;Decreased activity tolerance;Impaired balance (sitting and/or standing) OT Treatment Interventions:     OT Goals Acute Rehab OT Goals OT Goal Formulation:  (eval only)  Visit Information  Last OT Received On: 05/07/12 Assistance Needed: +1    Subjective Data  Subjective: i will have people to help me   Prior Functioning     Home Living Lives With: Alone Available Help at Discharge: Family;Available PRN/intermittently Type of Home: House Home Access: Stairs to enter [employer] of Steps: 2 Entrance Stairs-Rails: Right;Left Home Layout: One level Bathroom Shower/Tub: Engineer, manufacturing systems: Standard Bathroom Accessibility: Yes How Accessible: Accessible via walker Home Adaptive Equipment: Bedside commode/3-in-1;Shower chair with back;Walker - rolling;Straight cane;Wheelchair - manual Prior Function Level of Independence: Independent Able to Take Stairs?: Yes Driving: No Vocation: Retired Musician: No difficulties (Patient reports "wax in ears") Dominant Hand: Right         Vision/Perception Vision - Assessment Eye Alignment: Within Functional Limits Perception Perception: Within Functional Limits Praxis Praxis: Intact   Cognition  Overall Cognitive Status: Appears within functional limits for tasks assessed/performed Arousal/Alertness: Awake/alert Orientation Level: Appears intact for tasks assessed Behavior During Session: [facility] for tasks performed    Extremity/Trunk Assessment Right Upper Extremity Assessment RUE ROM/Strength/Tone: WFL for tasks assessed RUE Sensation: WFL - Light Touch;WFL -  Proprioception RUE Coordination: WFL - gross/fine motor Left Upper Extremity Assessment LUE ROM/Strength/Tone: WFL for tasks assessed LUE Sensation: WFL - Light Touch;WFL - Proprioception LUE Coordination: WFL - gross/fine motor Right  Lower Extremity Assessment RLE ROM/Strength/Tone: WFL for tasks assessed Left Lower Extremity Assessment LLE ROM/Strength/Tone: [facility] for tasks assessed Trunk Assessment Trunk Assessment: Normal     Mobility Bed Mobility Bed Mobility: Supine to Sit Supine to Sit: 6: Modified independent (Device/Increase time) Transfers Transfers: Stand to Sit;Sit to Stand Sit to Stand: 6: Modified independent (Device/Increase time);With upper extremity assist;From chair/3-in-1 Stand to Sit: 6: Modified independent (Device/Increase time);With upper extremity assist;To chair/3-in-1     Shoulder Instructions     Exercise Other Exercises Other Exercises: gaze stabilization   Balance Balance Balance Assessed: Yes Dynamic Standing Balance Dynamic Standing - Level of Assistance: 5: Stand by assistance High Level Balance High Level Balance Comments: No LOB observed during dynamic functional activities. Good safety. Min c/o dizziness with head turns   End of Session OT - End of Session Equipment Utilized During Treatment: Gait belt Activity Tolerance: Patient tolerated treatment well Patient left: in chair;with call bell/phone within reach Nurse Communication: Mobility status;Other (comment) (ready for D/C)  GO     Daphnie Venturini,HILLARY 05/07/2012, 1:46 PM [facility], OTR/L  [phone] 05/07/2012

## 2012-05-07 NOTE — Progress Notes (Signed)
Subjective: She reports to be feeling better but still has some dizziness. She declines the idea of discharging to a nursing home. She just wants to be sent home where her daughter, son and niece will take of her. She reports that she actually has a walker and a wheel chair at home, so she will not need these on discharge. No overnight events.   Objective: Vital signs in last 24 hours: Filed Vitals:   05/06/12 1353 05/06/12 1959 05/07/12 0158 05/07/12 0453  BP: 118/56 137/63  125/68  Pulse: 53 58  59  Temp: 97 F (36.1 C) 97.9 F (36.6 C)  98.3 F (36.8 C)  TempSrc: Oral Oral  Oral  Resp: 18 16  18   Height:      Weight:   137 lb 6.4 oz (62.324 kg)   SpO2: 93% 94%  93%   Weight change: -5 lb (-2.268 kg)  Intake/Output Summary (Last 24 hours) at 05/07/12 1036 Last data filed at 05/07/12 0531  Gross per 24 hour  Intake      0 ml  Output   2502 ml  Net  -2502 ml   Physical Exam: General: Well-developed, well-nourished, elderly WF, in no acute distress; resting in bed Head: Normocephalic, atraumatic. Eyes: PERRLA, EOMI, No signs of anemia or jaundice. Lungs: Normal respiratory effort. Clear to auscultation bilaterally, no wheezing appreciated Heart: regular rate (60) and rhythm, no m/r/g Abdomen: BS normoactive. Soft, Nondistended, non-tender. No masses or organomegaly appreciated. Extremities: No pretibial edema, distal pulses intact Neurologic: A little unbalanced on transfer to commode. Grossly non-focal, alert and oriented x3, appropriate and cooperative throughout examination.  Lab Results: Basic Metabolic Panel:  Lab 05/07/12 2956 05/06/12 0635  NA 142 141  K 3.9 3.8  CL 107 109  CO2 24 20  GLUCOSE 89 89  BUN 9 9  CREATININE 0.92 0.88  CALCIUM 8.9 8.4  MG -- --  PHOS -- --   Liver Function Tests:  Lab 05/03/12 0720  AST 11  ALT 6  ALKPHOS 63  BILITOT 0.2*  PROT 6.3  ALBUMIN 3.3*    Lab 05/03/12 0720  LIPASE 45  AMYLASE --   CBC:  Lab 05/07/12  0715 05/06/12 0635 05/03/12 0720  WBC 4.5 5.6 --  NEUTROABS -- -- 5.3  HGB 11.3* 10.3* --  HCT 34.0* 31.0* --  MCV 87.0 87.6 --  PLT 160 78* --    Urinalysis:  Lab 05/03/12 1456  COLORURINE YELLOW  LABSPEC 1.008  PHURINE 7.5  GLUCOSEU NEGATIVE  HGBUR NEGATIVE  BILIRUBINUR NEGATIVE  KETONESUR NEGATIVE  PROTEINUR NEGATIVE  UROBILINOGEN 0.2  NITRITE NEGATIVE  LEUKOCYTESUR NEGATIVE     Micro Results: Recent Results (from the past 240 hour(s))  URINE CULTURE     Status: Normal   Collection Time   05/03/12  2:56 PM      Component Value Range Status Comment   Specimen Description URINE, CLEAN CATCH   Final    Special Requests NONE   Final    Culture  Setup Time 05/03/2012 16:05   Final    Colony Count NO GROWTH   Final    Culture NO GROWTH   Final    Report Status 05/04/2012 FINAL   Final    Studies/Results: No results found. Medications: I have reviewed the patient's current medications. Scheduled Meds:    . dextromethorphan  15 mg Oral BID  . levofloxacin  500 mg Oral Daily  . pantoprazole  40 mg Oral Daily  .  polyethylene glycol  17 g Oral Daily   Continuous Infusions:   PRN Meds:.acetaminophen, acetaminophen, ondansetron (ZOFRAN) IV, ondansetron  Assessment/Plan: Ms. Devoss is an 79 yo with unclear medical history secondary to lack of medical care over many years admitted with concern for syncope and/or dizziness in setting of new incomplete right bundle branch block and bradycardia.  1. Syncope/dizziness: Ms Channing presented with an episode of dizziness and passing out at home. Her neurologic exam was nonfocal but she has a few episodes of hypotension up to as low as 92/32 which improved with intravenous fluids. She was not orthostatic. She was noted to have new incomplete RBBB compared to her EKG of 2005 and bradycardia (as low as 47 bpm) but she remained in normal sinus rhythm except for a few PVCs. MRI without acute intracranial abnormality. She reported  history of inner ear problems though her symptoms are not completely consistent with vertigo. Examination of her ears was not notable for wax impaction. Vestibular rehab recommended home exercise for her. The likely cause of her syncope was felt to be secondary to vasovagal reaction. She declined SNF placement. She will be discharged to home under the care of her family.   2. Sinus Bradycardia: Ms Stiver was noted to have bradycardia with a nadir of 49 bpm but without pauses. Her EKG revealed and incomplete RBBB which was not present in 2005. TSH and electrolytes were normal. Her echocardiogram was essentially unremarkable with EF of 65%. Cardiology was consulted and they reccommended home cardiac monitoring for 21 days with a follow up as outpatient with Dr Clifton James with [facility] in about four weeks to review the monitor. Appointment has been set up for 06/08/2012.     3.  Small right lower lobe community acquired pneumonia: denies cough, shortness of breath. Continues to be afebrile without leukocytosis, soft bp improving with IVF, hypothermia noted in the ED resolved.  Influenza PCR negative, Strep pneu negative.  Repeat CXR with small RLL airspace disease. Day #4 Azithromycin and Rocephin. She was discharged on a one day of levaquin (renal dosing as per pharmacy). She will need a repeat chest X ray after 6 weeks as follow up.  3. Chronic Abdominal pain:  Likely secondary to GERD and constipation. Currently without complaints of abdominal pain. Pt reported diarrhea but stool was well-formed in ED, abd xray with moderate stool in right colon. She will be discharged on protonix and miralax.   4. Thrombocytopenia Platelet count decreasing 154-->100-->78 today. Her medications at the hospital were not suspected to cause this. No evidence of active bleeding, Hb stable. She will need a repeat CBC to follow up this thrombocytopenia.  - Will continue to monitor   5. DVT ppx: Lovenox SQ   6. Code  Status: FULL code  Dispo: Disposition is deferred at this time, awaiting evaluation by Cardiology. Anticipate discharge in next 1 day. -will have PT/OT eval for home needs  The patient does not have a current PCP (No primary provider on file.), therefore will require follow-up after discharge.  The patient does not have transportation limitations that hinder transportation to clinic appointments.     LOS: 4 days   Dow Adolph 05/07/2012, 10:36 AM

## 2012-05-07 NOTE — Care Management Note (Signed)
    Page 1 of 1   05/07/2012     5:05:51 PM   CARE MANAGEMENT NOTE 05/07/2012  Patient:  Joann Herrera, Joann Herrera   Account Number:  [id number]  Date Initiated:  05/07/2012  Documentation initiated by:  Versie Soave  Subjective/Objective Assessment:   PT ADM WITH SYNCOPE, PNA ON 05/03/12.  PTA, PT RESIDES AT HOME WITH SON.     Action/Plan:   MET WITH PT AND SON TO DISCUSS DC PLANS.  WILL NEED HOME PT/OT FOLLOW UP.  REFERRAL TO [facility], PER PT CHOICE.  START OF CARE 24-48H POST DC DATE.   Anticipated DC Date:  05/07/2012   Anticipated DC Plan:  HOME W HOME HEALTH SERVICES      DC Planning Services  CM consult      [facility] Choice  HOME HEALTH   Choice offered to / List presented to:  C-1 Patient        HH arranged  HH-2 PT  HH-3 OT      [facility] agency  [facility]   Status of service:  Completed, signed off Medicare Important Message given?   (If response is "NO", the following Medicare IM given date fields will be blank) Date Medicare IM given:   Date Additional Medicare IM given:    Discharge Disposition:  HOME W HOME HEALTH SERVICES  Per UR Regulation:  Reviewed for med. necessity/level of care/duration of stay  If discussed at Long Length of Stay Meetings, dates discussed:    Comments:

## 2012-05-07 NOTE — Progress Notes (Signed)
    SUBJECTIVE: No chest pain or SOB.   BP 125/68  Pulse 59  Temp 98.3 F (36.8 C) (Oral)  Resp 18  Ht 5\' 5"  (1.651 m)  Wt 137 lb 6.4 oz (62.324 kg)  BMI 22.86 kg/m2  SpO2 93%  Intake/Output Summary (Last 24 hours) at 05/07/12 0755 Last data filed at 05/07/12 0531  Gross per 24 hour  Intake      0 ml  Output   2502 ml  Net  -2502 ml    PHYSICAL EXAM General: Well developed, well nourished, in no acute distress. Alert and oriented x 3.  Psych:  Good affect, responds appropriately Neck: No JVD. No masses noted.  Lungs: Clear bilaterally with no wheezes or rhonci noted.  Heart: RRR with no murmurs noted. Abdomen: Bowel sounds are present. Soft, non-tender.  Extremities: No lower extremity edema.   LABS: Basic Metabolic Panel:  Basename 05/06/12 0635 05/05/12 0455  NA 141 141  K 3.8 4.1  CL 109 111  CO2 20 20  GLUCOSE 89 87  BUN 9 10  CREATININE 0.88 0.92  CALCIUM 8.4 8.4  MG -- --  PHOS -- --   CBC:  Basename 05/07/12 0715 05/06/12 0635  WBC 4.5 5.6  NEUTROABS -- --  HGB 11.3* 10.3*  HCT 34.0* 31.0*  MCV 87.0 87.6  PLT 160 78*   Current Meds:    . azithromycin  500 mg Oral Daily  . cefTRIAXone (ROCEPHIN)  IV  1 g Intravenous Q24H  . dextromethorphan  15 mg Oral BID  . pantoprazole  40 mg Oral Daily  . polyethylene glycol  17 g Oral Daily    Echo 05/05/12:  Left ventricle: The cavity size was normal. Wall thickness was normal. Systolic function was normal. The estimated ejection fraction was in the range of 60% to 65%. Wall motion was normal; there were no regional wall motion abnormalities. Doppler parameters are consistent with abnormal left ventricular relaxation (grade 1 diastolic dysfunction). - Aortic valve: Trileaflet; mildly calcified leaflets. - Mitral valve: Calcified annulus. Mild regurgitation. - Left atrium: The atrium was mildly dilated. - Right ventricle: The cavity size was mildly to moderately dilated. Systolic function was  normal. - Right atrium: The atrium was mildly to moderately dilated. Central venous pressure: 15mm Hg (est). - Tricuspid valve: Mild regurgitation. - Pulmonary arteries: Systolic pressure was moderately increased. PA peak pressure: 47mm Hg (S). - Inferior vena cava: The vessel was upper normal in size; the respirophasic diameter changes were severely blunted (< 50%). - Pericardium, extracardiac: There was no pericardial effusion.  ASSESSMENT AND PLAN:  1. Syncope: unwitnessed at home following initial symptoms of dizziness, abdominal pain, nausea and emesis. Likely vasovagal. Telemetry has shown sinus bradycardia, mainly in the low 50s to around 60, no pauses. She is not symptomatic at these heart rates. Echo overall normal for her age with mild valvular disease and normal LV function. TSH normal.  She is on no specific offending medications. She will need f/u in our cardiology office [phone]) and will need to have a 21 day event monitor arranged at time of discharge. (Please page [phone] for assistance in arranging this). Follow up with me in 3-4 weeks so we can review monitor. OK to discharge home from my perspective.   2. Right lower lobe community-acquired pneumonia: Currently on antibiotics per primary team.    Joann Herrera  1/13/20147:55 AM

## 2012-05-07 NOTE — Discharge Instructions (Signed)
You were treated for dizziness, cause is still unknown Your heart rate was low as well  Please use a walker when you are at home  [facility] will contact you to set up the heart monitor  Please follow up with Dr Clifton James at his office to review the monitor on 06/09/2011 Please take a minute after you stand up before you start walking and if you feel dizzy, just sit back until you are stable to walk again.

## 2012-05-07 NOTE — Progress Notes (Signed)
Physical Therapy Treatment Patient Details Name: Joann Herrera MRN: [id number] DOB: [birth year] Today's Date: 05/07/2012 Time: 7829-5621 PT Time Calculation (min): 13 min  PT Assessment / Plan / Recommendation Comments on Treatment Session  Pt is 79 yo female with CAP who is progressing well though dizziness with ambulation continues to decrease safety with fall risk. Pt doing well with gaze stabilization activities but she has to remember to do them. Still feel that supervision upon initial discharge is necessary. She reports that her son is decising if he can stay with her until her daughter comes. Recommend SNF if she would be going home alone, HHPT only if she has supervision for mobility. PT will continue to see.     Follow Up Recommendations  SNF;Supervision/Assistance - 24 hour     Does the patient have the potential to tolerate intense rehabilitation     Barriers to Discharge        Equipment Recommendations  None recommended by PT    Recommendations for Other Services    Frequency Min 3X/week   Plan Discharge plan remains appropriate;Frequency remains appropriate    Precautions / Restrictions Precautions Precautions: Fall Restrictions Weight Bearing Restrictions: No   Pertinent Vitals/Pain No c/o pain    Mobility  Bed Mobility Bed Mobility: Supine to Sit;Sitting - Scoot to Edge of Bed Supine to Sit: 6: Modified independent (Device/Increase time) Sitting - Scoot to Edge of Bed: 6: Modified independent (Device/Increase time) Details for Bed Mobility Assistance: pt did not need assist for bed mobility today Transfers Transfers: Sit to Stand;Stand to Sit Sit to Stand: From bed;With upper extremity assist;6: Modified independent (Device/Increase time) Stand to Sit: 6: Modified independent (Device/Increase time);To bed;With upper extremity assist Details for Transfer Assistance: no assist needed, pt with safe hand placement Ambulation/Gait Ambulation/Gait Assistance:  4: Min guard;4: Min assist Ambulation Distance (Feet): 200 Feet Assistive device: Rolling walker Ambulation/Gait Assistance Details: RW used througohut ambulation today, pt in agreement that it makes her more safe. Min-guard A given throughout ambuation except at one point, pt made quick left head turn and lost balance to left with min A to correct. Reviewed gaze stabilization and had her practice turning eyes, head, body which was successful in performing turning without causing dizziness Gait Pattern: Step-through pattern;Decreased stride length Gait velocity: Slow gait speed. Stairs: No Wheelchair Mobility Wheelchair Mobility: No    Exercises Other Exercises Other Exercises: Focus on target object to decrease dizziness.   PT Diagnosis:    PT Problem List:   PT Treatment Interventions:     PT Goals Acute Rehab PT Goals PT Goal Formulation: With patient Time For Goal Achievement: 05/13/12 Potential to Achieve Goals: Good Pt will go Sit to Stand: with modified independence;with upper extremity assist PT Goal: Sit to Stand - Progress: Met Pt will Ambulate: >150 feet;with modified independence;with least restrictive assistive device PT Goal: Ambulate - Progress: Progressing toward goal Pt will Perform Home Exercise Program: Independently PT Goal: Perform Home Exercise Program - Progress: Progressing toward goal Additional Goals Additional Goal #1: Patient will complete above goals with dizziness 1/5 or less PT Goal: Additional Goal #1 - Progress: Progressing toward goal  Visit Information  Last PT Received On: 05/07/12 Assistance Needed: +1    Subjective Data  Subjective: I haven't felt dizzy yet this morning but I haven't been doing a lot of walking Patient Stated Goal: To get back to normal   Cognition  Overall Cognitive Status: Appears within functional limits for tasks assessed/performed Arousal/Alertness: Awake/alert  Orientation Level: Appears intact for tasks  assessed Behavior During Session: [facility] for tasks performed    Balance  Balance Balance Assessed: Yes Dynamic Standing Balance Dynamic Standing - Balance Support: Bilateral upper extremity supported;During functional activity Dynamic Standing - Level of Assistance: 5: Stand by assistance  End of Session PT - End of Session Equipment Utilized During Treatment: Gait belt Activity Tolerance: Patient tolerated treatment well Patient left: in bed;with call bell/phone within reach Nurse Communication: Mobility status   GP   Lyanne Co, PT  Acute Rehab Services  [phone]   Lyanne Co 05/07/2012, 9:37 AM

## 2012-05-08 NOTE — Discharge Summary (Signed)
Internal Medicine Teaching [facility] Discharge Note  Name: Joann Herrera MRN: [id number] DOB: [birth year] 79 y.o.  Date of Admission: 05/03/2012  6:22 AM Date of Discharge: 05/08/2012 Attending Physician: No att. providers found  Discharge Diagnosis: Principal Problem:  *CAP (community acquired pneumonia) Active Problems:  Hypothermia  Right BBB/left post fasc block  Hypotension  Abdominal pain  Constipation  Syncope   Discharge Medications:   Medication List     As of 05/08/2012  4:47 PM    TAKE these medications         pantoprazole 40 MG tablet   Commonly known as: PROTONIX   Take 1 tablet (40 mg total) by mouth daily.      polyethylene glycol packet   Commonly known as: MIRALAX / GLYCOLAX   Take 17 g by mouth daily.      TYLENOL PO   Take 1 tablet by mouth every 6 (six) hours as needed. For pain        Disposition and follow-up:   [name] was discharged from [facility] in stable condition.  At the hospital follow up visit please address  - Please review her symptoms of constipation and manage as seen fit - Please repeat a check CBC for thrombocytopenia - The patient will need ear syringing bilaterally for cerumen impaction  - Please assist patient to follow up with his cardiologist for review of monitor  Follow-up Appointments:     Follow-up Information    Follow up with [facility], MD. On 06/08/2012. (at 9:15am)    Contact information:   [address] [place] [address] [phone]       Call [facility]. (Please call for cleaning of your ears and other healthcare needs)    Contact information:   [address] [place] [address] [phone]        Discharge Orders    Future Appointments: Provider: Department: Dept Phone: Center:   05/18/2012 10:30 AM Lorretta Harp, MD [facility] [phone] [facility]   06/08/2012 9:15 AM  Kathleene Hazel, MD [facility] [place]) [phone] [facility]     Future Orders Please Complete By Expires   Diet - low sodium heart healthy      Increase activity slowly         Consultations: Treatment Team:  R[facility], MD  Procedures Performed:  Dg Chest 2 View  05/05/2012  *RADIOLOGY REPORT*  Clinical Data: Pneumonia.  Cough.  Short of breath.  CHEST - 2 VIEW  Comparison: 05/03/2012.  Findings: Small focus patchy right lower lobe airspace density is present, which may represent aspiration pneumonitis or pneumonia. Subsegmental atelectasis at the right lung base is also present.  The cardiopericardial silhouette is upper limits of normal size for projection. Aortic arch atherosclerosis.  Mild atelectasis of the left lung base.  There is no pleural effusion.  IMPRESSION: Small focus of right lower lobe airspace disease, likely representing pneumonia or aspiration pneumonitis.  Borderline cardiomegaly.   Original Report Authenticated By: Andreas Newport, M.D.    Mr Laqueta Jean Wo Contrast  05/04/2012  *RADIOLOGY REPORT*  Clinical Data: 79 year old female with new onset dizziness. Syncope.  Evaluate posterior circulation infarct. Confusion.  MRI HEAD WITHOUT AND WITH CONTRAST  Technique:  Multiplanar, multiecho pulse sequences of the brain and surrounding structures were obtained according to standard protocol without and with intravenous contrast  Contrast: 15mL MULTIHANCE GADOBENATE DIMEGLUMINE 529 MG/ML IV SOLN  Comparison: None.  Findings: No restricted diffusion to suggest acute infarction.  No midline shift, mass effect, evidence of mass lesion, ventriculomegaly, extra-axial collection or acute intracranial hemorrhage.  Cervicomedullary junction and pituitary are within normal limits.  Major intracranial vascular flow voids are preserved.  Mild for age periventricular and other scattered cerebral white matter T2 and FLAIR hyperintensity, acute significance  doubtful. Tiny chronic lacunar infarcts or more likely dilated perivascular spaces in the right basal ganglia.  Deep gray matter nuclei, brainstem, and cerebellum otherwise within normal limits.  Negative visualized cervical spine.  Normal bone marrow signal. No abnormal enhancement identified.  Postoperative changes to the globes.  Incidental right parotid gland atrophy.  Mild paranasal sinus mucosal thickening.  Mastoids are clear.  Grossly normal visualized internal auditory structures. Negative scalp soft tissues.  IMPRESSION: No acute intracranial abnormality. Largely unremarkable for age MRI appearance of the brain.   Original Report Authenticated By: Erskine Speed, M.D.    Dg Abd Acute W/chest  05/03/2012  *RADIOLOGY REPORT*  Clinical Data: Abdominal pain, vomiting  ACUTE ABDOMEN SERIES (ABDOMEN 2 VIEW & CHEST 1 VIEW)  Comparison: Chest radiographs dated 03/01/2004  Findings: Mild patchy right lower lobe opacity, atelectasis versus pneumonia. No pleural effusion or pneumothorax.  The heart is normal in size.  Nonobstructive bowel gas pattern.  No evidence of free air on the lateral decubitus view.  Moderate stool in the right colon.  Degenerative changes of the visualized thoracolumbar spine.  IMPRESSION: Mild patchy right lower lobe opacity, atelectasis versus pneumonia.  No evidence of small bowel obstruction or free air.  Moderate stool in the right colon.   Original Report Authenticated By: Charline Bills, M.D.     2D Echo: 05/05/12 Study Conclusions  - Left ventricle: The cavity size was normal. Wall thickness was normal. Systolic function was normal. The estimated ejection fraction was in the range of 60% to 65%. Wall motion was normal; there were no regional wall motion abnormalities. Doppler parameters are consistent with abnormal left ventricular relaxation (grade 1 diastolic dysfunction). - Aortic valve: Trileaflet; mildly calcified leaflets. - Mitral valve: Calcified annulus. Mild  regurgitation. - Left atrium: The atrium was mildly dilated. - Right ventricle: The cavity size was mildly to moderately dilated. Systolic function was normal. - Right atrium: The atrium was mildly to moderately dilated. Central venous pressure: 15mm Hg (est). - Tricuspid valve: Mild regurgitation. - Pulmonary arteries: Systolic pressure was moderately increased. PA peak pressure: 47mm Hg (S). - Inferior vena cava: The vessel was upper normal in size; the respirophasic diameter changes were severely blunted (< 50%). - Pericardium, extracardiac: There was no pericardial effusion.   Admission HPI:  Ms. Chojnowski is an 79yo woman with PMH of OA and chronic abdominal pain who presented to the ED because she used her life alert necklace for an ambulance to come visit. When first seen, she was very sleepy and her medical history was difficult to obtain. She had been given phenergan in the ED. At that time she apparently reported having a cough, SOB and production of white sputum. When I saw her, she denied these symptoms and reported only an occasional cough. She denied fever, chills, recent URI symptoms. She does have chronic abdominal pain, in the upper part of her stomach which she describes as "upset" feeling. The pain has been going on for many years and she takes an antacid with improvement in her symptoms. She has a history of on and off diarrhea that lasts for about 3  days, is watery. She is currently having this issue now. She take immodium for this issue. Per the ED physician, she had a well formed stool in the ED.  What prompted her to be seen by healthcare this time is as follows: On the day of admission, she had an acute episode of nausea and vomiting, which did not have blood in it. After this happened, she began to have dizziness which caused her to be unsteady on her feet. She describes the dizziness as lightheaded and the room spinning. She has had "inner ear" problems before, which  resolved with rest and a "small white pill" that a doctor gave her. Her dizziness progressed to the point that when she stood she felt like she was going to black out. The dizziness started while sitting on the side of her bed and continued to standing. It is worse with "any" movement of her head. She called life alert at that time explaining these symptoms and then she reported that she did in fact black out. She was subsequently brought to the ED. She complained of nausea and abdominal pain in the ED and received phenergan and zofran which caused her to be sleepy and made the history hard to obtain at first.  Ms. Vicente has not had much interaction with healthcare and does not have a PCP. She takes only tylenol at home. She is a current every day smoker.   Review of Systems:  Constitutional: Denies fever, + chills, diaphoresis, appetite change and fatigue.  HEENT: Denies photophobia, eye pain, redness, hearing loss, ear pain, congestion, sore throat, rhinorrhea, sneezing, mouth sores, trouble swallowing, neck pain, neck stiffness and tinnitus.  Respiratory: Denies SOB, DOE, +cough, chest tightness, and wheezing.  Cardiovascular: Denies chest pain, palpitations and leg swelling.  Gastrointestinal: + nausea,+ vomiting, +abdominal pain, +diarrhea, constipation, blood in stool and abdominal distention.  Genitourinary: Denies dysuria, urgency, frequency, hematuria, flank pain and difficulty urinating.  Musculoskeletal: Denies myalgias, back pain, joint swelling, arthralgias and +gait problem.  Skin: Denies pallor, rash and wound.  Neurological: Denies dizziness, seizures, syncope, weakness, +light-headedness, numbness and headaches.  Hematological: Denies adenopathy. Easy bruising, personal or family bleeding history  Psychiatric/Behavioral: Denies suicidal ideation, mood changes, confusion, nervousness, sleep disturbance and agitation  Physical Exam:  Blood pressure 108/61, pulse 67, temperature 95.1  F (35.1 C), temperature source Rectal, resp. rate 15, SpO2 100.00%.  General: somnolent, and somewhat cooperative to examination.  Head: normocephalic and atraumatic.  Eyes: vision grossly intact, pupils equal, pupils round, pupils reactive to light, no injection and anicteric.  Mouth: pharynx pink and moist, no erythema, and no exudates.  Neck: supple, full ROM, no thyromegaly, no JVD, and no carotid bruits.  Lungs: normal respiratory effort, no accessory muscle use, diffused decreased breath sounds due to poor effort, no crackles, and no wheezes. Heart: normal rate, regular rhythm, no murmur, no gallop, and no rub.  Abdomen: soft, +tender around epigastrium, normal bowel sounds, no distention, no guarding, no rebound tenderness Msk: no joint swelling, no joint warmth, and no redness over joints. Healed scars on bilateral knees Pulses: 2+ DP/PT pulses bilaterally Extremities: No cyanosis, clubbing, edema Neurologic: somnolent, cranial nerves grossly II-XII intact, strength normal in all extremities, sensation intact to light touch  Skin: turgor normal and no rashes.  Psych: unable to assess  Hospital Course by problem list:  1.Syncope/dizziness: Ms Warbington presented with an episode of dizziness and passing out at home. Her neurologic exam was nonfocal but she has a few episodes  of hypotension up to as low as 92/32 which improved with intravenous fluids. She was not orthostatic. She was noted to have new incomplete RBBB compared to her EKG of 2005 and bradycardia (as low as 47 bpm) but she remained in normal sinus rhythm except for a few PVCs. MRI without acute intracranial abnormality. She reported history of inner ear problems though her symptoms are not completely consistent with vertigo. Examination of her ears was not notable for wax impaction. Vestibular rehab recommended home exercise for her. The likely cause of her syncope was felt to be secondary to vasovagal reaction. She declined SNF  placement. She was discharged to home under the care of her family.   2. Sinus Bradycardia: Ms Deschepper was noted to have bradycardia with a nadir of 49 bpm but without pauses. Her EKG revealed and incomplete RBBB which was not present in 2005. TSH and electrolytes were normal. Her echocardiogram was essentially unremarkable with EF of 65%. Cardiology was consulted and they reccommended home cardiac monitoring for 21 days with a follow up as outpatient with Dr Clifton James with [facility] in about four weeks to review the monitor. Appointment has been set up for 06/08/2012.   3. Small right lower lobe community acquired pneumonia: denies cough, shortness of breath. Continues to be afebrile without leukocytosis, soft bp improving with IVF, hypothermia noted in the ED resolved. Influenza PCR negative, Strep pneu negative. Repeat CXR with small RLL airspace disease. Day #4 Azithromycin and Rocephin. She was discharged on a one day of levaquin (renal dosing as per pharmacy). She will need a repeat chest X ray after 6 weeks as follow up.   3. Chronic Abdominal pain: Likely secondary to GERD and constipation. Currently without complaints of abdominal pain. Pt reported diarrhea but stool was well-formed in ED, abd xray with moderate stool in right colon. She will be discharged on protonix and miralax.   4. Thrombocytopenia  Platelet count decreasing 154-->100-->78 today. Her medications at the hospital were not suspected to cause this. No evidence of active bleeding, Hb stable. She will need a repeat CBC to follow up this thrombocytopenia.   Discharge Vitals:  BP 122/65  Pulse 51  Temp 98 F (36.7 C) (Oral)  Resp 18  Ht 5\' 5"  (1.651 m)  Wt 137 lb 6.4 oz (62.324 kg)  BMI 22.86 kg/m2  SpO2 97%  Discharge Labs: No results found for this or any previous visit (from the past 24 hour(s)).  Signed: Dow Adolph 05/08/2012, 4:47 PM   Time Spent on Discharge: 1 hour Services Ordered on Discharge:  None Equipment Ordered on Discharge: None

## 2012-05-12 ENCOUNTER — Emergency Department (HOSPITAL_COMMUNITY)
Admission: EM | Admit: 2012-05-12 | Discharge: 2012-05-12 | Disposition: A | Discharge status: Home / Self Care | Payer: Medicare Other | Attending: Emergency Medicine | Admitting: Emergency Medicine

## 2012-05-12 ENCOUNTER — Encounter (HOSPITAL_COMMUNITY): Payer: Self-pay | Admitting: Emergency Medicine

## 2012-05-12 DIAGNOSIS — H6120 Impacted cerumen, unspecified ear: Secondary | ICD-10-CM

## 2012-05-12 DIAGNOSIS — F17200 Nicotine dependence, unspecified, uncomplicated: Secondary | ICD-10-CM | POA: Insufficient documentation

## 2012-05-12 DIAGNOSIS — Z8701 Personal history of pneumonia (recurrent): Secondary | ICD-10-CM | POA: Diagnosis not present

## 2012-05-12 DIAGNOSIS — M129 Arthropathy, unspecified: Secondary | ICD-10-CM | POA: Diagnosis not present

## 2012-05-12 DIAGNOSIS — Z79899 Other long term (current) drug therapy: Secondary | ICD-10-CM | POA: Insufficient documentation

## 2012-05-12 DIAGNOSIS — Z8669 Personal history of other diseases of the nervous system and sense organs: Secondary | ICD-10-CM | POA: Insufficient documentation

## 2012-05-12 DIAGNOSIS — H9190 Unspecified hearing loss, unspecified ear: Secondary | ICD-10-CM

## 2012-05-12 MED ORDER — CARBAMIDE PEROXIDE 6.5 % OT SOLN: 5.0000 [drp] | Freq: Two times a day (BID) | OTIC | Status: DC

## 2012-05-12 NOTE — ED Provider Notes (Signed)
History  Scribed for Marlon Pel, PA-C/ Juliet Rude. Pickering, MD, the patient was seen in room WTR7/WTR7. This chart was scribed by Candelaria Stagers. The patient's care started at 4:22 PM   CSN: [id number]  Arrival date & time 05/12/12  1428   First MD Initiated Contact with Patient 05/12/12 1554      Chief Complaint  Patient presents with  . Hearing Loss     The history is provided by the patient and a relative. No language interpreter was used.   Joann Herrera is a 79 y.o. female who presents to the Emergency Department complaining of bilateral hearing loss that started earlier today after visiting Urgent Care for ear wax removal.  Pt is normally hard of hearing and reports that the hearing loss became worse after visiting Urgent Care today.  She has no other sx.  Pt denies ear pain.  Nothing seems to make the sx better or worse.      Past Medical History  Diagnosis Date  . Arthritis   . Pneumonia 04/2012  . Macular degeneration     Past Surgical History  Procedure Date  . Total knee arthroplasty     x 2  . Hemorrhoid surgery   . Cataracts     No family history on file.  History  Substance Use Topics  . Smoking status: Current Every Day Smoker -- 1.0 packs/day for 40 years    Types: Cigarettes  . Smokeless tobacco: Never Used  . Alcohol Use: No    OB History    Grav Para Term Preterm Abortions TAB SAB Ect Mult Living                  Review of Systems  Constitutional: Negative for fever and chills.  HENT: Positive for hearing loss (bilaterally).   Respiratory: Negative for shortness of breath.   Gastrointestinal: Negative for nausea and vomiting.  Neurological: Negative for weakness.  All other systems reviewed and are negative.    Allergies  Aspirin; Codeine; Morphine and related; and Penicillins  Home Medications   Current Outpatient Rx  Name  Route  Sig  Dispense  Refill  . ACETAMINOPHEN 500 MG PO TABS   Oral   Take 1,000 mg by mouth every  6 (six) hours as needed. For pain         . CARBAMIDE PEROXIDE 6.5 % OT SOLN   Both Ears   Place 5 drops into both ears 2 (two) times daily.   15 mL   0     BP 120/57  Pulse 62  Temp 98.1 F (36.7 C) (Oral)  Resp 16  SpO2 94%  Physical Exam  Nursing note and vitals reviewed. Constitutional: She is oriented to person, place, and time. She appears well-developed and well-nourished. No distress.  HENT:  Head: Normocephalic and atraumatic.       Cerumen impaction bilaterally.  Unable to visualize TMs bilaterally.    Eyes: EOM are normal.  Neck: Neck supple. No tracheal deviation present.  Cardiovascular: Normal rate.   Pulmonary/Chest: Effort normal. No respiratory distress.  Musculoskeletal: Normal range of motion.  Neurological: She is alert and oriented to person, place, and time.  Skin: Skin is warm and dry.  Psychiatric: She has a normal mood and affect. Her behavior is normal.    ED Course  Procedures  DIAGNOSTIC STUDIES: Oxygen Saturation is 94% on room air, normal by my interpretation.    COORDINATION OF CARE:  4:27 PM Half normal  saline and half normal peroxide placed in both ears to help break up ear wax.  Will attempt to irrigate in 20 min.  Performed by Marlon Pel, PA-C.  Pt tolerated procedure with no problems.    4:56 PM Irrigation of ears performed by Marlon Pel, PA-C. Very minimal amount of cerumen irrigated from ears bilaterally.  Pt tolerated procedure with no problems.  Advised pt to use the Debrox drops until she is able to follow up with ENT.  Daughter of pt understands and agrees.    Discharged with carbamide peroxide (DEBROX) 6.5 % otic solution   Labs Reviewed - No data to display No results found.   1. Cerumen impaction   2. Hearing loss       MDM  Pt discussed with Dr. Rubin Payor. Pt given referral to Dr. Lazarus Salines. Daughter will be staying with her mother until the appointment as she is in town from [place].    I personally  performed the services described in this documentation, which was scribed in my presence. The recorded information has been reviewed and is accurate.         Dorthula Matas, PA 05/12/12 2157

## 2012-05-12 NOTE — ED Provider Notes (Signed)
Medical screening examination/treatment/procedure(s) were performed by non-physician practitioner and as supervising physician I was immediately available for consultation/collaboration.  Juliet Rude. Rubin Payor, MD 05/12/12 2328

## 2012-05-12 NOTE — ED Notes (Signed)
Discharged from [facility] due to syncopal episode Thursday a week ago (used her medical alert @ 500am). Diagnoses with dehydration & pneumonia treated with anitibiotic & fluids. While in the hospital was instructed could not clean ears & told this would have to be done else where. Since discharge, has required assistance @ home however is progressively getting stronger.

## 2012-05-12 NOTE — ED Notes (Signed)
Pt was seen at Urgent Care early this after noon for ear wax removal. When pt left urgent care was unable to hear from either ear and daughter brought her here to be seen. Pt was slightly HOH prior to visit to Urgent Care this morning.

## 2012-05-14 ENCOUNTER — Telehealth: Payer: Self-pay | Admitting: Cardiovascular Disease

## 2012-05-14 DIAGNOSIS — H9190 Unspecified hearing loss, unspecified ear: Secondary | ICD-10-CM | POA: Diagnosis not present

## 2012-05-14 DIAGNOSIS — H6120 Impacted cerumen, unspecified ear: Secondary | ICD-10-CM | POA: Diagnosis not present

## 2012-05-14 NOTE — Telephone Encounter (Signed)
New Problem:    Patient's daughter called in because she is here form out of town and wanted to know if her mother could be see by Dr. Clifton James this week.  Please call back.

## 2012-05-14 NOTE — Telephone Encounter (Signed)
Tried to call  Number busy

## 2012-05-15 NOTE — Telephone Encounter (Signed)
Spoke with pt's daughter who lives out of town and was wondering if pt could see Dr. Clifton James this week. Pt is doing well and having no problems. Per hospital note pt to wear 21 day event monitor and see Dr. Clifton James after this. Daughter agreeable with waiting until February 14 scheduled appt so monitor results will be available.  She will bring pt in tomorrow to have monitor placed.

## 2012-05-16 ENCOUNTER — Telehealth: Payer: Self-pay | Admitting: *Deleted

## 2012-05-16 ENCOUNTER — Other Ambulatory Visit: Payer: Self-pay | Admitting: *Deleted

## 2012-05-16 ENCOUNTER — Encounter (INDEPENDENT_AMBULATORY_CARE_PROVIDER_SITE_OTHER): Payer: Medicare Other

## 2012-05-16 ENCOUNTER — Encounter: Payer: Self-pay | Admitting: Cardiovascular Disease

## 2012-05-16 DIAGNOSIS — R55 Syncope and collapse: Secondary | ICD-10-CM

## 2012-05-16 NOTE — Telephone Encounter (Signed)
3 week event monitor placed on Pt 05/16/12 TK

## 2012-05-18 ENCOUNTER — Ambulatory Visit: Payer: Medicare Other | Admitting: Internal Medicine

## 2012-06-08 ENCOUNTER — Encounter: Payer: Medicare Other | Admitting: Cardiovascular Disease

## 2012-06-14 ENCOUNTER — Encounter: Payer: Self-pay | Admitting: Cardiovascular Disease

## 2012-06-14 ENCOUNTER — Ambulatory Visit (INDEPENDENT_AMBULATORY_CARE_PROVIDER_SITE_OTHER): Payer: Medicare Other | Admitting: Cardiovascular Disease

## 2012-06-14 VITALS — BP 130/68 | HR 60 | Ht 67.0 in | Wt 129.0 lb

## 2012-06-14 DIAGNOSIS — R55 Syncope and collapse: Secondary | ICD-10-CM

## 2012-06-14 NOTE — Progress Notes (Signed)
History of Present Illness: 79 y.o.female with no chronic medical issues but recent hospital admission with syncope here today for hospital f/u. She was admitted to [facility] 05/03/12 l after an episode of weakness, abdominal pain, and syncope - unwitnessed at home. She awoke at 5 AM feeling "bad," specifically describing feeling of wooziness and as if "things were moving" when she would look in different directions. She got up and went to the kitchen to get something to eat. She felt dizzy, also complaining of abdominal pain with nausea. She states that she had an episode of emesis, pressed her life alert monitor, and then reportedly passed out. She states that she continued to be very weak, was taken to the ER and admitted. She reported frequent diarrhea. Her EKG showed right bundle branch block with normal PR interval. Telemetry showed sinus brady with no pauses. CXR with pneumonia. Brain MRI with no acute abnormalities.She was seen in consultation by Dr. Diona Browner.  Echo on 05/05/12 with normal LV size and function and no significant valve issues. TSH was normal. A 21 day event monitor was arranged at f/u and showed sinus brady with no long pauses, no arrythmias.   She is here today for follow up. She is feeling well. No recurrent dizziness, near syncope or syncope. No chest pain or SOB. She smokes 1ppd.   Primary Care Physician: None   Past Medical History  Diagnosis Date  . Arthritis   . Pneumonia 04/2012  . Macular degeneration     Past Surgical History  Procedure Laterality Date  . Total knee arthroplasty      x 2  . Hemorrhoid surgery    . Cataracts      Current Outpatient Prescriptions  Medication Sig Dispense Refill  . acetaminophen (TYLENOL) 500 MG tablet Take 1,000 mg by mouth every 6 (six) hours as needed. For pain       No current facility-administered medications for this visit.    Allergies  Allergen Reactions  . Aspirin Other (See Comments)    Reaction: G I upset    . Codeine Nausea And Vomiting  . Morphine And Related Nausea And Vomiting  . Penicillins Swelling    History   Social History  . Marital Status: Widowed    Spouse Name: N/A    Number of Children: N/A  . Years of Education: N/A   Occupational History  . Not on file.   Social History Main Topics  . Smoking status: Current Every Day Smoker -- 1.00 packs/day for 40 years    Types: Cigarettes  . Smokeless tobacco: Never Used  . Alcohol Use: No  . Drug Use: No  . Sexually Active:    Other Topics Concern  . Not on file   Social History Narrative  . No narrative on file    No family history on file.  Review of Systems:  As stated in the HPI and otherwise negative.   BP 130/68  Pulse 60  Ht 5\' 7"  (1.702 m)  Wt 129 lb (58.514 kg)  BMI 20.2 kg/m2  Physical Examination: General: Well developed, well nourished, NAD HEENT: OP clear, mucus membranes moist SKIN: warm, dry. No rashes. Neuro: No focal deficits Musculoskeletal: Muscle strength 5/5 all ext Psychiatric: Mood and affect normal Neck: No JVD, no carotid bruits, no thyromegaly, no lymphadenopathy. Lungs:Clear bilaterally, no wheezes, rhonci, crackles Cardiovascular: Regular rate and rhythm. No murmurs, gallops or rubs. Abdomen:Soft. Bowel sounds present. Non-tender.  Extremities: No lower extremity edema. Pulses are  2 + in the bilateral DP/PT.  Echo 05/05/12:  Left ventricle: The cavity size was normal. Wall thickness was normal. Systolic function was normal. The estimated ejection fraction was in the range of 60% to 65%. Wall motion was normal; there were no regional wall motion abnormalities. Doppler parameters are consistent with abnormal left ventricular relaxation (grade 1 diastolic dysfunction). - Aortic valve: Trileaflet; mildly calcified leaflets. - Mitral valve: Calcified annulus. Mild regurgitation. - Left atrium: The atrium was mildly dilated. - Right ventricle: The cavity size was mildly to  moderately dilated. Systolic function was normal. - Right atrium: The atrium was mildly to moderately dilated. Central venous pressure: 15mm Hg (est). - Tricuspid valve: Mild regurgitation. - Pulmonary arteries: Systolic pressure was moderately increased. PA peak pressure: 47mm Hg (S). - Inferior vena cava: The vessel was upper normal in size; the respirophasic diameter changes were severely blunted (< 50%). - Pericardium, extracardiac: There was no pericardial effusion.  Assessment and Plan:   1. Syncope: Sinus brady with baseline RBBB. No events on monitor. No further syncope. Her initial event last month was likely vasovagal. No further workup.   2. Tobacco abuse: Smoking cessation advised. She does not wish to stop.   Follow up prn.

## 2012-06-14 NOTE — Patient Instructions (Addendum)
Your physician recommends that you schedule a follow-up appointment as needed with Dr. McAlhany   

## 2012-09-27 DIAGNOSIS — H353190 Nonexudative age-related macular degeneration, unspecified eye, stage unspecified: Secondary | ICD-10-CM | POA: Diagnosis not present

## 2012-09-27 DIAGNOSIS — Z961 Presence of intraocular lens: Secondary | ICD-10-CM | POA: Diagnosis not present

## 2012-09-27 DIAGNOSIS — H33319 Horseshoe tear of retina without detachment, unspecified eye: Secondary | ICD-10-CM | POA: Diagnosis not present

## 2013-01-27 DIAGNOSIS — Z23 Encounter for immunization: Secondary | ICD-10-CM | POA: Diagnosis not present

## 2013-06-27 DIAGNOSIS — H04129 Dry eye syndrome of unspecified lacrimal gland: Secondary | ICD-10-CM | POA: Diagnosis not present

## 2013-06-27 DIAGNOSIS — H353190 Nonexudative age-related macular degeneration, unspecified eye, stage unspecified: Secondary | ICD-10-CM | POA: Diagnosis not present

## 2013-06-27 DIAGNOSIS — H33319 Horseshoe tear of retina without detachment, unspecified eye: Secondary | ICD-10-CM | POA: Diagnosis not present

## 2013-06-27 DIAGNOSIS — Z961 Presence of intraocular lens: Secondary | ICD-10-CM | POA: Diagnosis not present

## 2013-08-19 DIAGNOSIS — R05 Cough: Secondary | ICD-10-CM | POA: Diagnosis not present

## 2013-08-19 DIAGNOSIS — R059 Cough, unspecified: Secondary | ICD-10-CM | POA: Diagnosis not present

## 2013-09-02 ENCOUNTER — Ambulatory Visit (INDEPENDENT_AMBULATORY_CARE_PROVIDER_SITE_OTHER): Payer: Medicare Other | Admitting: Family

## 2013-09-02 ENCOUNTER — Encounter: Payer: Self-pay | Admitting: Family

## 2013-09-02 VITALS — BP 114/70 | HR 71 | Temp 97.5°F | Resp 16 | Ht 64.0 in | Wt 129.0 lb

## 2013-09-02 DIAGNOSIS — R5381 Other malaise: Secondary | ICD-10-CM | POA: Diagnosis not present

## 2013-09-02 DIAGNOSIS — R5383 Other fatigue: Secondary | ICD-10-CM

## 2013-09-02 DIAGNOSIS — K219 Gastro-esophageal reflux disease without esophagitis: Secondary | ICD-10-CM | POA: Diagnosis not present

## 2013-09-02 DIAGNOSIS — R269 Unspecified abnormalities of gait and mobility: Secondary | ICD-10-CM

## 2013-09-02 DIAGNOSIS — H6120 Impacted cerumen, unspecified ear: Secondary | ICD-10-CM | POA: Diagnosis not present

## 2013-09-02 DIAGNOSIS — H3530 Unspecified macular degeneration: Secondary | ICD-10-CM | POA: Diagnosis not present

## 2013-09-02 DIAGNOSIS — R2681 Unsteadiness on feet: Secondary | ICD-10-CM | POA: Insufficient documentation

## 2013-09-02 DIAGNOSIS — I499 Cardiac arrhythmia, unspecified: Secondary | ICD-10-CM

## 2013-09-02 DIAGNOSIS — R531 Weakness: Secondary | ICD-10-CM | POA: Insufficient documentation

## 2013-09-02 LAB — BASIC METABOLIC PANEL WITH GFR
BUN: 16 mg/dL (ref 6–23)
CHLORIDE: 103 meq/L (ref 96–112)
CO2: 25 mEq/L (ref 19–32)
Calcium: 9.4 mg/dL (ref 8.4–10.5)
Creat: 1.14 mg/dL — ABNORMAL HIGH (ref 0.50–1.10)
GFR, EST NON AFRICAN AMERICAN: 43 mL/min — AB
GFR, Est African American: 50 mL/min — ABNORMAL LOW
Glucose, Bld: 88 mg/dL (ref 70–99)
POTASSIUM: 4.3 meq/L (ref 3.5–5.3)
Sodium: 138 mEq/L (ref 135–145)

## 2013-09-02 LAB — CBC WITH DIFFERENTIAL/PLATELET
BASOS ABS: 0 10*3/uL (ref 0.0–0.1)
BASOS PCT: 0 % (ref 0–1)
EOS PCT: 3 % (ref 0–5)
Eosinophils Absolute: 0.2 10*3/uL (ref 0.0–0.7)
HEMATOCRIT: 37 % (ref 36.0–46.0)
Hemoglobin: 12.6 g/dL (ref 12.0–15.0)
Lymphocytes Relative: 40 % (ref 12–46)
Lymphs Abs: 3.1 10*3/uL (ref 0.7–4.0)
MCH: 29.7 pg (ref 26.0–34.0)
MCHC: 34.1 g/dL (ref 30.0–36.0)
MCV: 87.3 fL (ref 78.0–100.0)
MONO ABS: 0.5 10*3/uL (ref 0.1–1.0)
Monocytes Relative: 7 % (ref 3–12)
Neutro Abs: 3.9 10*3/uL (ref 1.7–7.7)
Neutrophils Relative %: 50 % (ref 43–77)
Platelets: 136 10*3/uL — ABNORMAL LOW (ref 150–400)
RBC: 4.24 MIL/uL (ref 3.87–5.11)
RDW: 14.6 % (ref 11.5–15.5)
WBC: 7.8 10*3/uL (ref 4.0–10.5)

## 2013-09-02 NOTE — Progress Notes (Signed)
Subjective:    Patient ID: Joann Herrera, female    DOB: 11/01/1923, 80 y.o.   MRN: [id number]  HPI  Joann Herrera is an 80 yr old female who presents today to establish care.  Weakness/unsteady gait- reports feeling weak. Doesn't "walk straight." Reports that she started feeling like this in January after she was in the hospital. Had dehydration and pneumonia.  Family member is concerned that she has had a stroke.  Notes that some months back she had an episode of slurred speech and weakness.  Had a syncopal episode with admission 04/2012.  Denies dysuria, frequency, urgency. Some chronic diarrhea.    Has not had a primary care doctor.  Lives alone.    GERD- reports well controlled by zantac.   HA- some headaches- but not every day. Usually above the right eye. , sometimes in the back of the head.  Denies sinus congestion.  Macular degeneration- She sees Dr. Javier Glazier at [facility]  Constitutional:       Wt Readings from Last 3 Encounters: 09/02/13 : 129 lb (58.514 kg) 06/14/12 : 129 lb (58.514 kg) 05/07/12 : 137 lb 6.4 oz (62.324 kg)   HENT: Negative for rhinorrhea.   Eyes:       Reports poor vision- unable to read  Respiratory:       Occasional cough  Cardiovascular: Negative for leg swelling.  Gastrointestinal: Positive for diarrhea.  Genitourinary: Negative for dysuria and frequency.  Musculoskeletal: Positive for arthralgias.  Skin: Negative for rash.  Neurological: Positive for headaches.  Psychiatric/Behavioral:       Denies depression/anxiety       Past Medical History  Diagnosis Date  . Arthritis   . Pneumonia 04/2012  . Macular degeneration     History   Social History  . Marital Status: Widowed    Spouse Name: N/A    Number of Children: N/A  . Years of Education: N/A   Occupational History  . Not on file.   Social History Main Topics  . Smoking status: Current Every Day Smoker -- 1.00 packs/day for 40 years    Types:  Cigarettes  . Smokeless tobacco: Never Used  . Alcohol Use: No  . Drug Use: No  . Sexual Activity: Not on file   Other Topics Concern  . Not on file   Social History Narrative   Daughter in [place] in [place]   Son in [place]   Retired- [employer], worked at [employer] for 28 years   Enjoys dancing and going to [place]   Completed 11th grade          Past Surgical History  Procedure Laterality Date  . Total knee arthroplasty      x 2  . Hemorrhoid surgery    . Cataracts    . Hand surgery      Pt states she had both hands operated on for arthritis.  . Shoulder surgery Left     arthroscopy    Family History  Problem Relation Age of Onset  . Arthritis Mother   . Cancer Sister     breast  . Diabetes Sister   . Suicidality Brother   . Diabetes Brother     Allergies  Allergen Reactions  . Aspirin Other (See Comments)    Reaction: G I upset  . Codeine Nausea And Vomiting  . Morphine And Related Nausea And Vomiting  .  Penicillins Swelling    Current Outpatient Prescriptions on File Prior to Visit  Medication Sig Dispense Refill  . acetaminophen (TYLENOL) 500 MG tablet Take 1,000 mg by mouth every 6 (six) hours as needed. For pain       No current facility-administered medications on file prior to visit.    BP 114/70  Pulse 71  Temp(Src) 97.5 F (36.4 C) (Oral)  Resp 16  Ht 5\' 4"  (1.626 m)  Wt 129 lb (58.514 kg)  BMI 22.13 kg/m2  SpO2 98%    Objective:   Physical Exam  Constitutional: She is oriented to person, place, and time. She appears well-developed and well-nourished. No distress.  HENT:  Head: Normocephalic and atraumatic.  Bilateral cerumen impaction- normal TM's after cerumen removal are noted  Eyes: EOM are normal. Pupils are equal, round, and reactive to light.  Cardiovascular: Normal rate and regular rhythm.   No murmur heard. Pulmonary/Chest: Effort normal and breath sounds normal. No  respiratory distress. She has no wheezes. She has no rales. She exhibits no tenderness.  Musculoskeletal: She exhibits no edema.  Neurological: She is alert and oriented to person, place, and time. She exhibits normal muscle tone.  Skin: Skin is warm and dry.  Psychiatric: She has a normal mood and affect. Her behavior is normal. Judgment and thought content normal.          Assessment & Plan:

## 2013-09-02 NOTE — Assessment & Plan Note (Signed)
Managed by opthalmology 

## 2013-09-02 NOTE — Assessment & Plan Note (Signed)
Advised pt and niece, that I would like her to complete MRI to evaluate for past stroke.  If + then we would need to work on secondary stroke prevention.

## 2013-09-02 NOTE — Progress Notes (Signed)
Pre visit review using our clinic review tool, if applicable. No additional management support is needed unless otherwise documented below in the visit note. 

## 2013-09-02 NOTE — Assessment & Plan Note (Addendum)
Generalized.  EKG notes NSR and incomplete RBBB.  (not new per old EKG 1/14- reviewed) check baseline labs/urine for further evaluation.

## 2013-09-02 NOTE — Patient Instructions (Signed)
You will be contacted about your MRI. Please complete lab work prior to leaving. Follow up in 3 months.

## 2013-09-02 NOTE — Assessment & Plan Note (Signed)
Stable on zantac.  

## 2013-09-02 NOTE — Assessment & Plan Note (Signed)
Cerumen removed with curette. Pt tolerated well.

## 2013-09-03 ENCOUNTER — Encounter: Payer: Self-pay | Admitting: Family

## 2013-09-03 ENCOUNTER — Telehealth: Payer: Self-pay | Admitting: Family

## 2013-09-03 LAB — URINALYSIS W MICROSCOPIC + REFLEX CULTURE
Bilirubin Urine: NEGATIVE
CASTS: NONE SEEN
CRYSTALS: NONE SEEN
Glucose, UA: NEGATIVE mg/dL
Hgb urine dipstick: NEGATIVE
KETONES UR: NEGATIVE mg/dL
NITRITE: POSITIVE — AB
PH: 7 (ref 5.0–8.0)
Protein, ur: NEGATIVE mg/dL
SPECIFIC GRAVITY, URINE: 1.009 (ref 1.005–1.030)
Squamous Epithelial / LPF: NONE SEEN
Urobilinogen, UA: 0.2 mg/dL (ref 0.0–1.0)

## 2013-09-03 LAB — TSH: TSH: 1.494 u[IU]/mL (ref 0.350–4.500)

## 2013-09-03 MED ORDER — CIPROFLOXACIN HCL 250 MG PO TABS: 250.0000 mg | ORAL_TABLET | Freq: Two times a day (BID) | ORAL | Status: DC

## 2013-09-03 NOTE — Telephone Encounter (Signed)
Sugar, electrolytes, blood count and thyroid are normal. Urine is suggestive of UTI.  Will start antibiotics.  Notified pt. Advised her to increase water intake as labs indicate she may be mildly dehydrated. She verbalizes understanding.

## 2013-09-03 NOTE — Telephone Encounter (Signed)
Relevant patient education mailed to patient.  

## 2013-09-04 LAB — URINE CULTURE: Colony Count: >=100000

## 2013-09-07 ENCOUNTER — Ambulatory Visit (HOSPITAL_BASED_OUTPATIENT_CLINIC_OR_DEPARTMENT_OTHER): Payer: Medicare Other

## 2013-09-10 ENCOUNTER — Ambulatory Visit (HOSPITAL_BASED_OUTPATIENT_CLINIC_OR_DEPARTMENT_OTHER)
Admission: RE | Admit: 2013-09-10 | Discharge: 2013-09-10 | Disposition: A | Discharge status: Home / Self Care | Payer: Medicare Other | Source: Ambulatory Visit | Attending: Family | Admitting: Family

## 2013-09-10 ENCOUNTER — Telehealth: Payer: Self-pay | Admitting: *Deleted

## 2013-09-10 ENCOUNTER — Ambulatory Visit (HOSPITAL_BASED_OUTPATIENT_CLINIC_OR_DEPARTMENT_OTHER): Payer: Medicare Other

## 2013-09-10 DIAGNOSIS — I6789 Other cerebrovascular disease: Secondary | ICD-10-CM | POA: Insufficient documentation

## 2013-09-10 DIAGNOSIS — R51 Headache: Secondary | ICD-10-CM | POA: Diagnosis not present

## 2013-09-10 DIAGNOSIS — J3489 Other specified disorders of nose and nasal sinuses: Secondary | ICD-10-CM | POA: Diagnosis not present

## 2013-09-10 DIAGNOSIS — R2681 Unsteadiness on feet: Secondary | ICD-10-CM

## 2013-09-10 NOTE — Telephone Encounter (Signed)
Message copied by Ronny Flurry on Tue Sep 10, 2013  4:30 PM ------      Message from: O'SULLIVAN, MELISSA      Created: Tue Sep 10, 2013  1:34 PM       Please notify pt that MRI of the brain is negative for stroke. Good news. ------

## 2013-09-10 NOTE — Telephone Encounter (Signed)
Notified pt's niece, Baker Janus. She states they forgot to tell us that pt has had TB in the past. Reports that pt is not feeling any better than at last visit. Very fatigued and problems with balance / gait. She is wanting to know if they can take pt to the lab and have her tested for TB? She denies fevers or cough.  Please advise.

## 2013-09-11 NOTE — Telephone Encounter (Signed)
Left message on home # to return my call. 

## 2013-09-11 NOTE — Telephone Encounter (Signed)
I am happy to test her for TB, however I would like to see her back in office first for re-evaluation as she has not improved.

## 2013-09-13 ENCOUNTER — Telehealth: Payer: Self-pay | Admitting: *Deleted

## 2013-09-13 ENCOUNTER — Ambulatory Visit (HOSPITAL_BASED_OUTPATIENT_CLINIC_OR_DEPARTMENT_OTHER)
Admission: RE | Admit: 2013-09-13 | Discharge: 2013-09-13 | Disposition: A | Discharge status: Home / Self Care | Payer: Medicare Other | Source: Ambulatory Visit | Attending: Family | Admitting: Family

## 2013-09-13 ENCOUNTER — Encounter: Payer: Self-pay | Admitting: Family

## 2013-09-13 ENCOUNTER — Telehealth: Payer: Self-pay | Admitting: Family

## 2013-09-13 ENCOUNTER — Ambulatory Visit (INDEPENDENT_AMBULATORY_CARE_PROVIDER_SITE_OTHER): Payer: Medicare Other | Admitting: Family

## 2013-09-13 VITALS — BP 140/70 | HR 63 | Temp 97.8°F | Resp 18 | Wt 130.1 lb

## 2013-09-13 DIAGNOSIS — R269 Unspecified abnormalities of gait and mobility: Secondary | ICD-10-CM

## 2013-09-13 DIAGNOSIS — R5383 Other fatigue: Secondary | ICD-10-CM | POA: Insufficient documentation

## 2013-09-13 DIAGNOSIS — R2681 Unsteadiness on feet: Secondary | ICD-10-CM

## 2013-09-13 DIAGNOSIS — N390 Urinary tract infection, site not specified: Secondary | ICD-10-CM

## 2013-09-13 DIAGNOSIS — F329 Major depressive disorder, single episode, unspecified: Secondary | ICD-10-CM | POA: Diagnosis not present

## 2013-09-13 DIAGNOSIS — F3289 Other specified depressive episodes: Secondary | ICD-10-CM | POA: Diagnosis not present

## 2013-09-13 DIAGNOSIS — R5381 Other malaise: Secondary | ICD-10-CM | POA: Insufficient documentation

## 2013-09-13 DIAGNOSIS — F32A Depression, unspecified: Secondary | ICD-10-CM

## 2013-09-13 MED ORDER — SERTRALINE HCL 50 MG PO TABS: ORAL_TABLET | ORAL | Status: DC

## 2013-09-13 NOTE — Telephone Encounter (Signed)
Please let pt know that I would like to refer her for PT for help with her balance.

## 2013-09-13 NOTE — Telephone Encounter (Signed)
Spoke with pt's niece, they will bring pt to the office today at 11:15.

## 2013-09-13 NOTE — Assessment & Plan Note (Signed)
Scored "mild depression" on PHQ-9 but I actually think this may be a greater issue for her than she is admitting and may be contributing to her overall malaise.  Will give trial of sertraline.  I instructed pt to start 1/2 tablet once daily for 1 week and then increase to a full tablet once daily on week two as tolerated.  Plan follow up in 1 month to evaluate progress.

## 2013-09-13 NOTE — Progress Notes (Signed)
Subjective:    Patient ID: Joann Herrera, female    DOB: 1923/10/28, 80 y.o.   MRN: [id number]  HPI  Joann Herrera is an 80 yr old female who presents today with chief complaint of fatigue.    She was last seen on 09/02/13 with chief complaint of fatigue/unsteady gait.  Family member was concerned that she may have suffered a stroke. MRI of the brain was performed which noted mild/moderate small vessesl disease changes but no acute infarct.  BMET revealed mild bump in creatinine at 1.14, baseline cr is 0.9.  Platelets were slightly depressed at 136, thoug in the past her platelets have been as low as 78.  Urine culture grew >100K of multiple morphotypes.  She was placed on cipro.  Completed. Still feels unsteady on her feet. Family started giving her oral b12 supplements as it has helped her energy in the past.    Family reports that pt continues to have issues with fatigue and balance.  They report hx of tuberculosis. She was diagnosed 9 years ago. She was exposed to someone with tb who died from TB.  She completed 1 year of abx and serial chest x-rays.    Review of Systems See HPI  Past Medical History  Diagnosis Date  . Arthritis   . Pneumonia 04/2012  . Macular degeneration     History   Social History  . Marital Status: Widowed    Spouse Name: N/A    Number of Children: N/A  . Years of Education: N/A   Occupational History  . Not on file.   Social History Main Topics  . Smoking status: Current Every Day Smoker -- 1.00 packs/day for 40 years    Types: Cigarettes  . Smokeless tobacco: Never Used  . Alcohol Use: No  . Drug Use: No  . Sexual Activity: Not on file   Other Topics Concern  . Not on file   Social History Narrative   Daughter in [place] in [place]   Son in [place]   Retired- [employer], worked at [employer] for 28 years   Enjoys dancing and going to [place]   Completed 11th grade          Past Surgical  History  Procedure Laterality Date  . Total knee arthroplasty      x 2  . Hemorrhoid surgery    . Cataracts    . Hand surgery      Pt states she had both hands operated on for arthritis.  . Shoulder surgery Left     arthroscopy    Family History  Problem Relation Age of Onset  . Arthritis Mother   . Cancer Sister     breast  . Diabetes Sister   . Suicidality Brother   . Diabetes Brother     Allergies  Allergen Reactions  . Aspirin Other (See Comments)    Reaction: G I upset  . Codeine Nausea And Vomiting  . Morphine And Related Nausea And Vomiting  . Penicillins Swelling    Current Outpatient Prescriptions on File Prior to Visit  Medication Sig Dispense Refill  . acetaminophen (TYLENOL) 500 MG tablet Take 1,000 mg by mouth every 6 (six) hours as needed. For pain      . Multiple Vitamins-Minerals (OCUVITE EYE + MULTI PO) Take 1 tablet by mouth daily.      . Ranitidine HCl (ACID REDUCER PO) Take 1 tablet by  mouth daily as needed.       No current facility-administered medications on file prior to visit.    BP 140/70  Pulse 63  Temp(Src) 97.8 F (36.6 C) (Oral)  Resp 18  Wt 130 lb 1.9 oz (59.022 kg)  SpO2 99%       Objective:   Physical Exam  Constitutional: She is oriented to person, place, and time. She appears well-developed and well-nourished. No distress.  HENT:  Head: Normocephalic and atraumatic.  Cardiovascular: Normal rate and regular rhythm.   No murmur heard. Pulmonary/Chest: Effort normal and breath sounds normal. No respiratory distress. She has no wheezes. She has no rales. She exhibits no tenderness.  Neurological: She is alert and oriented to person, place, and time.  Psychiatric: She has a normal mood and affect. Her behavior is normal. Judgment and thought content normal.          Assessment & Plan:

## 2013-09-13 NOTE — Telephone Encounter (Signed)
Sertraline rx originally sent to [facility]. Pt would like [facility] try [facility] as it is cheaper. Rx sent to [facility] on [address] and cancelled with Millie at [facility].

## 2013-09-13 NOTE — Progress Notes (Signed)
Pre visit review using our clinic review tool, if applicable. No additional management support is needed unless otherwise documented below in the visit note. 

## 2013-09-13 NOTE — Assessment & Plan Note (Signed)
Medical work up thus far has been negative except for UTI and I have repeated urine culture today.  Will obtain CXR given hx of pneumonia and TB.

## 2013-09-13 NOTE — Patient Instructions (Signed)
Please leave urine specimen prior to leaving. Complete chest x ray on the first floor. Start sertraline 50mg , 1/2 tab once daily for 1 week, increase to a full tab once daily on week two. Follow up in 1 month.

## 2013-09-13 NOTE — Assessment & Plan Note (Signed)
Will refer for PT.

## 2013-09-15 LAB — URINE CULTURE
COLONY COUNT: NO GROWTH
ORGANISM ID, BACTERIA: NO GROWTH

## 2013-09-15 NOTE — Telephone Encounter (Signed)
Also, urine culture is negative.

## 2013-09-17 NOTE — Telephone Encounter (Signed)
Notified pt and she is agreeable to proceed with Home Health PT.

## 2013-09-18 ENCOUNTER — Telehealth: Payer: Self-pay | Admitting: *Deleted

## 2013-09-18 NOTE — Telephone Encounter (Signed)
No answer at home #. Received message that memory is full. Mailed results to pt.

## 2013-09-18 NOTE — Telephone Encounter (Signed)
Message copied by Ronny Flurry on Wed Sep 18, 2013 10:30 AM ------      Message from: O'SULLIVAN, MELISSA      Created: Fri Sep 13, 2013  2:46 PM       cxr is negative- no pneumonia, no active TB infection noted. ------

## 2013-09-19 DIAGNOSIS — Z8611 Personal history of tuberculosis: Secondary | ICD-10-CM | POA: Diagnosis not present

## 2013-09-19 DIAGNOSIS — R5381 Other malaise: Secondary | ICD-10-CM | POA: Diagnosis not present

## 2013-09-19 DIAGNOSIS — R5383 Other fatigue: Secondary | ICD-10-CM | POA: Diagnosis not present

## 2013-09-19 DIAGNOSIS — H3530 Unspecified macular degeneration: Secondary | ICD-10-CM | POA: Diagnosis not present

## 2013-09-19 DIAGNOSIS — R279 Unspecified lack of coordination: Secondary | ICD-10-CM | POA: Diagnosis not present

## 2013-09-19 DIAGNOSIS — IMO0001 Reserved for inherently not codable concepts without codable children: Secondary | ICD-10-CM | POA: Diagnosis not present

## 2013-09-19 DIAGNOSIS — M1990 Unspecified osteoarthritis, unspecified site: Secondary | ICD-10-CM | POA: Diagnosis not present

## 2013-09-19 DIAGNOSIS — F17200 Nicotine dependence, unspecified, uncomplicated: Secondary | ICD-10-CM | POA: Diagnosis not present

## 2013-09-19 DIAGNOSIS — Z8701 Personal history of pneumonia (recurrent): Secondary | ICD-10-CM | POA: Diagnosis not present

## 2013-09-20 ENCOUNTER — Telehealth: Payer: Self-pay | Admitting: *Deleted

## 2013-09-20 DIAGNOSIS — R5381 Other malaise: Secondary | ICD-10-CM | POA: Diagnosis not present

## 2013-09-20 DIAGNOSIS — R5383 Other fatigue: Secondary | ICD-10-CM | POA: Diagnosis not present

## 2013-09-20 DIAGNOSIS — M1990 Unspecified osteoarthritis, unspecified site: Secondary | ICD-10-CM | POA: Diagnosis not present

## 2013-09-20 DIAGNOSIS — R279 Unspecified lack of coordination: Secondary | ICD-10-CM | POA: Diagnosis not present

## 2013-09-20 DIAGNOSIS — H3530 Unspecified macular degeneration: Secondary | ICD-10-CM | POA: Diagnosis not present

## 2013-09-20 DIAGNOSIS — F17200 Nicotine dependence, unspecified, uncomplicated: Secondary | ICD-10-CM | POA: Diagnosis not present

## 2013-09-20 DIAGNOSIS — IMO0001 Reserved for inherently not codable concepts without codable children: Secondary | ICD-10-CM | POA: Diagnosis not present

## 2013-09-20 NOTE — Telephone Encounter (Signed)
Received message from Thomasene Lot, PT with [facility]. She is requesting OT evaluation and treatment due to pt's low vision and history of falls to assist pt with ADLs in the home. Please advise.

## 2013-09-20 NOTE — Telephone Encounter (Signed)
Yes please

## 2013-09-20 NOTE — Telephone Encounter (Signed)
Left detailed message on Cathy's voicemail and to call if further questions.

## 2013-09-24 DIAGNOSIS — IMO0001 Reserved for inherently not codable concepts without codable children: Secondary | ICD-10-CM | POA: Diagnosis not present

## 2013-09-24 DIAGNOSIS — R279 Unspecified lack of coordination: Secondary | ICD-10-CM | POA: Diagnosis not present

## 2013-09-24 DIAGNOSIS — R5381 Other malaise: Secondary | ICD-10-CM | POA: Diagnosis not present

## 2013-09-24 DIAGNOSIS — R5383 Other fatigue: Secondary | ICD-10-CM | POA: Diagnosis not present

## 2013-09-24 DIAGNOSIS — F17200 Nicotine dependence, unspecified, uncomplicated: Secondary | ICD-10-CM | POA: Diagnosis not present

## 2013-09-24 DIAGNOSIS — H3530 Unspecified macular degeneration: Secondary | ICD-10-CM | POA: Diagnosis not present

## 2013-09-24 DIAGNOSIS — M1990 Unspecified osteoarthritis, unspecified site: Secondary | ICD-10-CM | POA: Diagnosis not present

## 2013-09-26 DIAGNOSIS — M1990 Unspecified osteoarthritis, unspecified site: Secondary | ICD-10-CM | POA: Diagnosis not present

## 2013-09-26 DIAGNOSIS — R5381 Other malaise: Secondary | ICD-10-CM | POA: Diagnosis not present

## 2013-09-26 DIAGNOSIS — H3530 Unspecified macular degeneration: Secondary | ICD-10-CM | POA: Diagnosis not present

## 2013-09-26 DIAGNOSIS — R5383 Other fatigue: Secondary | ICD-10-CM | POA: Diagnosis not present

## 2013-09-26 DIAGNOSIS — F17200 Nicotine dependence, unspecified, uncomplicated: Secondary | ICD-10-CM | POA: Diagnosis not present

## 2013-09-26 DIAGNOSIS — R279 Unspecified lack of coordination: Secondary | ICD-10-CM | POA: Diagnosis not present

## 2013-09-26 DIAGNOSIS — IMO0001 Reserved for inherently not codable concepts without codable children: Secondary | ICD-10-CM | POA: Diagnosis not present

## 2013-10-01 DIAGNOSIS — R5383 Other fatigue: Secondary | ICD-10-CM | POA: Diagnosis not present

## 2013-10-01 DIAGNOSIS — F17200 Nicotine dependence, unspecified, uncomplicated: Secondary | ICD-10-CM | POA: Diagnosis not present

## 2013-10-01 DIAGNOSIS — R5381 Other malaise: Secondary | ICD-10-CM | POA: Diagnosis not present

## 2013-10-01 DIAGNOSIS — R279 Unspecified lack of coordination: Secondary | ICD-10-CM | POA: Diagnosis not present

## 2013-10-01 DIAGNOSIS — M1990 Unspecified osteoarthritis, unspecified site: Secondary | ICD-10-CM | POA: Diagnosis not present

## 2013-10-01 DIAGNOSIS — IMO0001 Reserved for inherently not codable concepts without codable children: Secondary | ICD-10-CM | POA: Diagnosis not present

## 2013-10-01 DIAGNOSIS — H3530 Unspecified macular degeneration: Secondary | ICD-10-CM | POA: Diagnosis not present

## 2013-10-03 DIAGNOSIS — R5381 Other malaise: Secondary | ICD-10-CM | POA: Diagnosis not present

## 2013-10-03 DIAGNOSIS — H3530 Unspecified macular degeneration: Secondary | ICD-10-CM | POA: Diagnosis not present

## 2013-10-03 DIAGNOSIS — IMO0001 Reserved for inherently not codable concepts without codable children: Secondary | ICD-10-CM | POA: Diagnosis not present

## 2013-10-03 DIAGNOSIS — R5383 Other fatigue: Secondary | ICD-10-CM | POA: Diagnosis not present

## 2013-10-03 DIAGNOSIS — F17200 Nicotine dependence, unspecified, uncomplicated: Secondary | ICD-10-CM | POA: Diagnosis not present

## 2013-10-03 DIAGNOSIS — M1990 Unspecified osteoarthritis, unspecified site: Secondary | ICD-10-CM | POA: Diagnosis not present

## 2013-10-03 DIAGNOSIS — R279 Unspecified lack of coordination: Secondary | ICD-10-CM | POA: Diagnosis not present

## 2013-11-11 ENCOUNTER — Ambulatory Visit: Payer: Medicare Other | Admitting: Family Medicine

## 2013-12-03 ENCOUNTER — Ambulatory Visit: Payer: Medicare Other | Admitting: Family

## 2013-12-23 ENCOUNTER — Ambulatory Visit: Payer: Medicare Other | Admitting: Family

## 2014-01-07 ENCOUNTER — Ambulatory Visit: Payer: Medicare Other | Admitting: Family

## 2014-01-25 DIAGNOSIS — Z23 Encounter for immunization: Secondary | ICD-10-CM | POA: Diagnosis not present

## 2014-03-17 ENCOUNTER — Ambulatory Visit (INDEPENDENT_AMBULATORY_CARE_PROVIDER_SITE_OTHER): Payer: Medicare Other | Admitting: Family

## 2014-03-17 ENCOUNTER — Encounter: Payer: Self-pay | Admitting: Family

## 2014-03-17 VITALS — BP 125/54 | HR 70 | Temp 97.4°F | Resp 16 | Ht 64.0 in | Wt 137.0 lb

## 2014-03-17 DIAGNOSIS — L02429 Furuncle of limb, unspecified: Secondary | ICD-10-CM | POA: Diagnosis not present

## 2014-03-17 DIAGNOSIS — G4700 Insomnia, unspecified: Secondary | ICD-10-CM

## 2014-03-17 MED ORDER — DOXYCYCLINE HYCLATE 100 MG PO TABS: 100.0000 mg | ORAL_TABLET | Freq: Two times a day (BID) | ORAL | Status: DC

## 2014-03-17 NOTE — Assessment & Plan Note (Addendum)
Procedure including risks/benefits explained to patient.  Questions were answered. After informed consent was obtained the site was cleansed with betadine and then alcohol. 1% Lidocaine with epinephrine was infiltrated into lesion and Pt tolerated procedure well- with some mild discomfort.  Scant bloody drainage was expressed and obtained for culture. Advised pt to apply warm compresses bid to help facilitate drainage.  Will rx with doxycycline.  Pt advised as outlined in AVS.

## 2014-03-17 NOTE — Addendum Note (Signed)
Addended by: Harl Bowie on: 03/17/2014 03:27 PM   Modules accepted: Orders

## 2014-03-17 NOTE — Assessment & Plan Note (Signed)
Limit your coffee intake to 1-2 cups in the AM, avoid caffeine after noon. Avoid daytime naps. Dedicate at least 6 consecutive hours to sleep. You may try benadryl 12.5mg  once daily 1 hour before bed to see if this helps with sleep.

## 2014-03-17 NOTE — Progress Notes (Signed)
Subjective:    Patient ID: Joann Herrera, female    DOB: [birth year], 80 y.o.   MRN: [id number]  HPI  Ms. Kueker is an 80 yr old female who presents today with chief complaint of boil on her right shoulder.   She was last seen in May and at that time was given rx for sertraline for depression.  Didn't think it was helping.  She still struggles with insomnia.  Goes to sleep at 6AM and wakes up sleeps for 1 hour before waking up.  She drinks a lot of coffee.     Review of Systems See HPI  Past Medical History  Diagnosis Date  . Arthritis   . Pneumonia 04/2012  . Macular degeneration     History   Social History  . Marital Status: Widowed    Spouse Name: N/A    Number of Children: N/A  . Years of Education: N/A   Occupational History  . Not on file.   Social History Main Topics  . Smoking status: Current Every Day Smoker -- 1.00 packs/day for 40 years    Types: Cigarettes  . Smokeless tobacco: Never Used  . Alcohol Use: No  . Drug Use: No  . Sexual Activity: Not on file   Other Topics Concern  . Not on file   Social History Narrative   Daughter in [place] in [place]   Son in [place]   Retired- [employer], worked at [employer] for 28 years   Enjoys dancing and going to [place]   Completed 11th grade          Past Surgical History  Procedure Laterality Date  . Total knee arthroplasty      x 2  . Hemorrhoid surgery    . Cataracts    . Hand surgery      Pt states she had both hands operated on for arthritis.  . Shoulder surgery Left     arthroscopy    Family History  Problem Relation Age of Onset  . Arthritis Mother   . Cancer Sister     breast  . Diabetes Sister   . Suicidality Brother   . Diabetes Brother     Allergies  Allergen Reactions  . Aspirin Other (See Comments)    Reaction: G I upset  . Codeine Nausea And Vomiting  . Morphine And Related Nausea And Vomiting  . Penicillins Swelling     Current Outpatient Prescriptions on File Prior to Visit  Medication Sig Dispense Refill  . acetaminophen (TYLENOL) 500 MG tablet Take 1,000 mg by mouth every 6 (six) hours as needed. For pain    . Ranitidine HCl (ACID REDUCER PO) Take 1 tablet by mouth daily as needed.     No current facility-administered medications on file prior to visit.    BP 125/54 mmHg  Pulse 70  Temp(Src) 97.4 F (36.3 C) (Axillary)  Resp 16  Ht 5\' 4"  (1.626 m)  Wt 137 lb (62.143 kg)  BMI 23.50 kg/m2  SpO2 99%       Objective:   Physical Exam  Constitutional: She is oriented to person, place, and time. She appears well-developed and well-nourished. No distress.  Neurological: She is alert and oriented to person, place, and time.  Skin: Skin is warm and dry.  + boil left lateral shoulder.     Psychiatric: She has a normal mood and affect. Her behavior is normal. Judgment  and thought content normal.          Assessment & Plan:

## 2014-03-17 NOTE — Progress Notes (Signed)
Pre visit review using our clinic review tool, if applicable. No additional management support is needed unless otherwise documented below in the visit note. 

## 2014-03-17 NOTE — Patient Instructions (Addendum)
Apply warm compresses to the boil twice daily, then apply antibiotic ointment and dry dressing. Start doxycycline for skin boil. Call if increased pain, redness and swelling or if fever >100. Limit your coffee intake to 1-2 cups in the AM, avoid caffeine after noon. Avoid daytime naps. Dedicate at least 6 consecutive hours to sleep. You may try benadryl 12.5mg  once daily 1 hour before bed to see if this helps with sleep. Please follow up in 1 week.

## 2014-03-20 LAB — WOUND CULTURE
GRAM STAIN: NONE SEEN
Gram Stain: NONE SEEN
Organism ID, Bacteria: NO GROWTH

## 2014-03-24 ENCOUNTER — Encounter: Payer: Self-pay | Admitting: Family

## 2014-03-24 ENCOUNTER — Ambulatory Visit (INDEPENDENT_AMBULATORY_CARE_PROVIDER_SITE_OTHER): Payer: Medicare Other | Admitting: Family

## 2014-03-24 VITALS — BP 118/60 | HR 62 | Temp 97.6°F | Resp 16 | Ht 64.0 in | Wt 137.6 lb

## 2014-03-24 DIAGNOSIS — L02429 Furuncle of limb, unspecified: Secondary | ICD-10-CM

## 2014-03-24 DIAGNOSIS — L989 Disorder of the skin and subcutaneous tissue, unspecified: Secondary | ICD-10-CM | POA: Diagnosis not present

## 2014-03-24 MED ORDER — CLINDAMYCIN HCL 300 MG PO CAPS: 300.0000 mg | ORAL_CAPSULE | Freq: Four times a day (QID) | ORAL | Status: DC

## 2014-03-24 MED ORDER — DOXYCYCLINE HYCLATE 100 MG PO TABS
100.0000 mg | ORAL_TABLET | Freq: Two times a day (BID) | ORAL | Status: DC
Start: 2014-03-24 — End: 2014-04-04

## 2014-03-24 NOTE — Patient Instructions (Signed)
Please start doxycycline and clindamycin. We will arrange appointment with surgeon. Follow up with me in 1 week.  Call sooner if symptoms worsen or if they do not improve.

## 2014-03-24 NOTE — Progress Notes (Signed)
Subjective:    Patient ID: Joann Herrera, female    DOB: June 29, 1923, 80 y.o.   MRN: [id number]  HPI   Joann Herrera is an 80 yr old female who presents today for follow up of the boil on her Left shoulder.  Last visit she underwent I and D with little drainage obtained. She was discharged home on doxycycline. Reports that area remains unchanged.  No improvement with abx.  Very little drainage is reported. Area is very sore.     Review of Systems    see HPI  Past Medical History  Diagnosis Date  . Arthritis   . Pneumonia 04/2012  . Macular degeneration     History   Social History  . Marital Status: Widowed    Spouse Name: N/A    Number of Children: N/A  . Years of Education: N/A   Occupational History  . Not on file.   Social History Main Topics  . Smoking status: Current Every Day Smoker -- 1.00 packs/day for 40 years    Types: Cigarettes  . Smokeless tobacco: Never Used  . Alcohol Use: No  . Drug Use: No  . Sexual Activity: Not on file   Other Topics Concern  . Not on file   Social History Narrative   Daughter in [place] in [place]   Son in [place]   Retired- [employer], worked at [employer] for 28 years   Enjoys dancing and going to [place]   Completed 11th grade          Past Surgical History  Procedure Laterality Date  . Total knee arthroplasty      x 2  . Hemorrhoid surgery    . Cataracts    . Hand surgery      Pt states she had both hands operated on for arthritis.  . Shoulder surgery Left     arthroscopy    Family History  Problem Relation Age of Onset  . Arthritis Mother   . Cancer Sister     breast  . Diabetes Sister   . Suicidality Brother   . Diabetes Brother     Allergies  Allergen Reactions  . Aspirin Other (See Comments)    Reaction: G I upset  . Codeine Nausea And Vomiting  . Morphine And Related Nausea And Vomiting  . Penicillins Swelling    Current Outpatient  Prescriptions on File Prior to Visit  Medication Sig Dispense Refill  . acetaminophen (TYLENOL) 500 MG tablet Take 1,000 mg by mouth every 6 (six) hours as needed. For pain    . Cyanocobalamin (VITAMIN B12 PO) Take 1 tablet by mouth daily.    . Ranitidine HCl (ACID REDUCER PO) Take 1 tablet by mouth daily as needed.     No current facility-administered medications on file prior to visit.    BP 118/60 mmHg  Pulse 62  Temp(Src) 97.6 F (36.4 C) (Oral)  Resp 16  Ht 5\' 4"  (1.626 m)  Wt 137 lb 9.6 oz (62.415 kg)  BMI 23.61 kg/m2  SpO2 98%    Objective:   Physical Exam  Constitutional: She is oriented to person, place, and time. She appears well-developed and well-nourished. No distress.  Neurological: She is alert and oriented to person, place, and time.  Skin: Skin is warm and dry.  Boil left shoulder is tender, unchanged in size  Assessment & Plan:

## 2014-03-24 NOTE — Assessment & Plan Note (Addendum)
Unchanged. Will refer to surgeon and broaden abx.  Resume doxy, add clindamycin. Advised pt that she will need follow up with me or surgeon in 1 week.

## 2014-03-24 NOTE — Progress Notes (Signed)
Pre visit review using our clinic review tool, if applicable. No additional management support is needed unless otherwise documented below in the visit note. 

## 2014-03-26 DIAGNOSIS — C44609 Unspecified malignant neoplasm of skin of left upper limb, including shoulder: Secondary | ICD-10-CM | POA: Diagnosis not present

## 2014-03-26 DIAGNOSIS — L988 Other specified disorders of the skin and subcutaneous tissue: Secondary | ICD-10-CM | POA: Diagnosis not present

## 2014-03-26 DIAGNOSIS — C44629 Squamous cell carcinoma of skin of left upper limb, including shoulder: Secondary | ICD-10-CM | POA: Diagnosis not present

## 2014-04-04 ENCOUNTER — Encounter: Payer: Self-pay | Admitting: Physician Assistant

## 2014-04-04 ENCOUNTER — Ambulatory Visit (INDEPENDENT_AMBULATORY_CARE_PROVIDER_SITE_OTHER): Payer: Medicare Other | Admitting: Physician Assistant

## 2014-04-04 VITALS — BP 108/62 | HR 71 | Temp 98.2°F | Wt 133.0 lb

## 2014-04-04 DIAGNOSIS — M6248 Contracture of muscle, other site: Secondary | ICD-10-CM | POA: Diagnosis not present

## 2014-04-04 DIAGNOSIS — M62838 Other muscle spasm: Secondary | ICD-10-CM

## 2014-04-04 MED ORDER — DICLOFENAC 18 MG PO CAPS: 1.0000 | ORAL_CAPSULE | Freq: Two times a day (BID) | ORAL | Status: DC

## 2014-04-04 MED ORDER — CLONAZEPAM 0.5 MG PO TABS: 0.2500 mg | ORAL_TABLET | Freq: Two times a day (BID) | ORAL | Status: DC | PRN

## 2014-04-04 NOTE — Progress Notes (Signed)
Pre visit review using our clinic review tool, if applicable. No additional management support is needed unless otherwise documented below in the visit note. 

## 2014-04-04 NOTE — Patient Instructions (Signed)
Please take medications as directed.  Avoid heavy lifting or overexertion.  Apply topical Aspercreme to area.  Follow-up in 1 week if symptoms are not improving.

## 2014-04-07 DIAGNOSIS — M62838 Other muscle spasm: Secondary | ICD-10-CM | POA: Insufficient documentation

## 2014-04-07 NOTE — Progress Notes (Signed)
Patient presents to clinic today c/o neck pain and tension over the past week that is worse when she moves her neck and shoulders.  Denies trauma or injury.  Denies numbness or radiation.  States the muscles feel tight.  Past Medical History  Diagnosis Date  . Arthritis   . Pneumonia 04/2012  . Macular degeneration     Current Outpatient Prescriptions on File Prior to Visit  Medication Sig Dispense Refill  . acetaminophen (TYLENOL) 500 MG tablet Take 1,000 mg by mouth every 6 (six) hours as needed. For pain    . Cyanocobalamin (VITAMIN B12 PO) Take 1 tablet by mouth daily.    . Ranitidine HCl (ACID REDUCER PO) Take 1 tablet by mouth daily as needed.     No current facility-administered medications on file prior to visit.    Allergies  Allergen Reactions  . Aspirin Other (See Comments)    Reaction: G I upset  . Codeine Nausea And Vomiting  . Morphine And Related Nausea And Vomiting  . Penicillins Swelling    Family History  Problem Relation Age of Onset  . Arthritis Mother   . Cancer Sister     breast  . Diabetes Sister   . Suicidality Brother   . Diabetes Brother     History   Social History  . Marital Status: Widowed    Spouse Name: N/A    Number of Children: N/A  . Years of Education: N/A   Social History Main Topics  . Smoking status: Current Every Day Smoker -- 1.00 packs/day for 40 years    Types: Cigarettes  . Smokeless tobacco: Never Used  . Alcohol Use: No  . Drug Use: No  . Sexual Activity: None   Other Topics Concern  . None   Social History Narrative   Daughter in [place] in [place]   Son in [place]   Retired- [employer], worked at [employer] for 28 years   Enjoys dancing and going to [place]   Completed 11th grade         Review of Systems - See HPI.  All other ROS are negative.  BP 108/62 mmHg  Pulse 71  Temp(Src) 98.2 F (36.8 C)  Wt 133 lb (60.328 kg)  SpO2 95%  Physical Exam    Constitutional: She is oriented to person, place, and time and well-developed, well-nourished, and in no distress.  HENT:  Head: Normocephalic and atraumatic.  Eyes: Conjunctivae are normal.  Neck: Neck supple.  Cardiovascular: Normal rate, regular rhythm, normal heart sounds and intact distal pulses.   Pulmonary/Chest: Effort normal and breath sounds normal. No respiratory distress. She has no wheezes. She has no rales. She exhibits no tenderness.  Musculoskeletal:       Right shoulder: Normal.       Left shoulder: Normal.       Cervical back: She exhibits tenderness and spasm. She exhibits normal range of motion and no bony tenderness.  Neurological: She is alert and oriented to person, place, and time.  Skin: Skin is warm and dry. No rash noted.  Psychiatric: Affect normal.  Vitals reviewed.  Recent Results (from the past 2160 hour(s))  Wound culture     Status: None   Collection Time: 03/17/14  3:27 PM  Result Value Ref Range   Gram Stain Rare    Gram Stain WBC present-both PMN and Mononuclear    Gram Stain No Squamous Epithelial Cells  Seen    Gram Stain No Organisms Seen    Organism ID, Bacteria NO GROWTH 2 DAYS     Assessment/Plan: Muscle spasms of neck Patient intolerant of multiple medications for pain.  Will give low-dose of Klonopin for spasms.  Topical Aspercreme to the area. Diclofenac sparingly for pain.  Follow-up in 1 week if symptoms have not resolved.

## 2014-04-07 NOTE — Assessment & Plan Note (Signed)
Patient intolerant of multiple medications for pain.  Will give low-dose of Klonopin for spasms.  Topical Aspercreme to the area. Diclofenac sparingly for pain.  Follow-up in 1 week if symptoms have not resolved.

## 2014-05-29 DIAGNOSIS — H04123 Dry eye syndrome of bilateral lacrimal glands: Secondary | ICD-10-CM | POA: Diagnosis not present

## 2014-05-29 DIAGNOSIS — H3531 Nonexudative age-related macular degeneration: Secondary | ICD-10-CM | POA: Diagnosis not present

## 2014-05-29 DIAGNOSIS — H33311 Horseshoe tear of retina without detachment, right eye: Secondary | ICD-10-CM | POA: Diagnosis not present

## 2014-05-29 DIAGNOSIS — Z961 Presence of intraocular lens: Secondary | ICD-10-CM | POA: Diagnosis not present

## 2014-06-05 DIAGNOSIS — D485 Neoplasm of uncertain behavior of skin: Secondary | ICD-10-CM | POA: Diagnosis not present

## 2014-06-06 DIAGNOSIS — C44311 Basal cell carcinoma of skin of nose: Secondary | ICD-10-CM | POA: Diagnosis not present

## 2014-07-08 DIAGNOSIS — C44321 Squamous cell carcinoma of skin of nose: Secondary | ICD-10-CM | POA: Diagnosis not present

## 2014-07-15 DIAGNOSIS — Z4889 Encounter for other specified surgical aftercare: Secondary | ICD-10-CM | POA: Diagnosis not present

## 2014-07-15 DIAGNOSIS — C44629 Squamous cell carcinoma of skin of left upper limb, including shoulder: Secondary | ICD-10-CM | POA: Diagnosis not present

## 2014-07-30 ENCOUNTER — Encounter: Payer: Self-pay | Admitting: Family

## 2014-07-30 DIAGNOSIS — IMO0002 Reserved for concepts with insufficient information to code with codable children: Secondary | ICD-10-CM | POA: Insufficient documentation

## 2014-08-27 DIAGNOSIS — L570 Actinic keratosis: Secondary | ICD-10-CM | POA: Diagnosis not present

## 2014-08-27 DIAGNOSIS — L821 Other seborrheic keratosis: Secondary | ICD-10-CM | POA: Diagnosis not present

## 2014-08-27 DIAGNOSIS — Z85828 Personal history of other malignant neoplasm of skin: Secondary | ICD-10-CM | POA: Diagnosis not present

## 2014-08-27 DIAGNOSIS — D1801 Hemangioma of skin and subcutaneous tissue: Secondary | ICD-10-CM | POA: Diagnosis not present

## 2014-08-27 DIAGNOSIS — L814 Other melanin hyperpigmentation: Secondary | ICD-10-CM | POA: Diagnosis not present

## 2014-11-15 ENCOUNTER — Encounter (HOSPITAL_COMMUNITY): Payer: Self-pay | Admitting: Emergency Medicine

## 2014-11-15 ENCOUNTER — Observation Stay (HOSPITAL_COMMUNITY)
Admission: EM | Admit: 2014-11-15 | Discharge: 2014-11-17 | Disposition: A | Discharge status: Home / Self Care | Payer: Medicare Other | Attending: Internal Medicine | Admitting: Internal Medicine

## 2014-11-15 DIAGNOSIS — R404 Transient alteration of awareness: Secondary | ICD-10-CM | POA: Diagnosis not present

## 2014-11-15 DIAGNOSIS — Z23 Encounter for immunization: Secondary | ICD-10-CM | POA: Insufficient documentation

## 2014-11-15 DIAGNOSIS — R42 Dizziness and giddiness: Principal | ICD-10-CM | POA: Insufficient documentation

## 2014-11-15 DIAGNOSIS — E781 Pure hyperglyceridemia: Secondary | ICD-10-CM | POA: Diagnosis not present

## 2014-11-15 DIAGNOSIS — I6782 Cerebral ischemia: Secondary | ICD-10-CM | POA: Diagnosis not present

## 2014-11-15 DIAGNOSIS — R001 Bradycardia, unspecified: Secondary | ICD-10-CM | POA: Diagnosis not present

## 2014-11-15 DIAGNOSIS — H814 Vertigo of central origin: Secondary | ICD-10-CM

## 2014-11-15 DIAGNOSIS — J9809 Other diseases of bronchus, not elsewhere classified: Secondary | ICD-10-CM | POA: Diagnosis not present

## 2014-11-15 DIAGNOSIS — H6123 Impacted cerumen, bilateral: Secondary | ICD-10-CM | POA: Diagnosis not present

## 2014-11-15 DIAGNOSIS — I639 Cerebral infarction, unspecified: Secondary | ICD-10-CM | POA: Diagnosis present

## 2014-11-15 DIAGNOSIS — J9811 Atelectasis: Secondary | ICD-10-CM | POA: Diagnosis not present

## 2014-11-15 DIAGNOSIS — R51 Headache: Secondary | ICD-10-CM | POA: Insufficient documentation

## 2014-11-15 DIAGNOSIS — R55 Syncope and collapse: Secondary | ICD-10-CM | POA: Diagnosis present

## 2014-11-15 DIAGNOSIS — I959 Hypotension, unspecified: Secondary | ICD-10-CM | POA: Insufficient documentation

## 2014-11-15 DIAGNOSIS — M069 Rheumatoid arthritis, unspecified: Secondary | ICD-10-CM | POA: Diagnosis not present

## 2014-11-15 DIAGNOSIS — H8149 Vertigo of central origin, unspecified ear: Secondary | ICD-10-CM | POA: Diagnosis not present

## 2014-11-15 DIAGNOSIS — A084 Viral intestinal infection, unspecified: Secondary | ICD-10-CM | POA: Insufficient documentation

## 2014-11-15 DIAGNOSIS — M1990 Unspecified osteoarthritis, unspecified site: Secondary | ICD-10-CM | POA: Diagnosis present

## 2014-11-15 NOTE — ED Notes (Signed)
Bed: WA06 Expected date:  Expected time:  Means of arrival:  Comments: EMS 81yo F Nausea Vomiting dizziness

## 2014-11-15 NOTE — ED Provider Notes (Signed)
CSN: [id number]     Arrival date & time 11/15/14  2328 History  This chart was scribed for Jubal Rademaker, MD by Randa Evens, ED Scribe. This patient was seen in room WA06/WA06 and the patient's care was started at 11:42 PM.      Chief Complaint  Patient presents with  . Dizziness   Patient is a 81 y.o. female presenting with dizziness. The history is provided by the patient. No language interpreter was used.  Dizziness Quality:  Head spinning Severity:  Moderate Duration:  2 hours Chronicity:  New Context: eye movement and head movement   Relieved by:  None tried Worsened by:  Eye movement and turning head Ineffective treatments:  None tried Associated symptoms: diarrhea, nausea and vomiting   Associated symptoms: no tinnitus    HPI Comments: Joann Herrera is a 81 y.o. female brought in by ambulance, who presents to the Emergency Department complaining of dizziness descried as the room spinning onset tonight 2 hours PTA. Pt states that she has associated nausea, vomiting abdominal pain for 3 days and diarrhea.  Pt states that the dizziness was worse when moving her head and eyes. Pt report similar symptoms 1 year prior. Pt denies any new medications. Denies tinnitus, cough, congestion or other related symptoms.     Past Medical History  Diagnosis Date  . Arthritis    History reviewed. No pertinent past surgical history. History reviewed. No pertinent family history. History  Substance Use Topics  . Smoking status: Never Smoker   . Smokeless tobacco: Never Used  . Alcohol Use: No   OB History    No data available     Review of Systems  HENT: Negative for congestion and tinnitus.   Respiratory: Negative for cough.   Gastrointestinal: Positive for nausea, vomiting and diarrhea.  Neurological: Positive for dizziness.  All other systems reviewed and are negative.    Allergies  Review of patient's allergies indicates no known allergies.  Home Medications   Prior  to Admission medications   Medication Sig Start Date End Date Taking? Authorizing Provider  acetaminophen (TYLENOL) 500 MG tablet Take 1,000 mg by mouth every 6 (six) hours as needed.   Yes Historical Provider, MD   BP 117/56 mmHg  Pulse 66  Temp(Src) 97.4 F (36.3 C) (Oral)  Resp 20  SpO2 94%   Physical Exam  Constitutional: She is oriented to person, place, and time. She appears well-developed and well-nourished. No distress.  HENT:  Head: Normocephalic and atraumatic.  Right Ear: Tympanic membrane normal.  Cerumen in left ear.   Eyes: Conjunctivae and EOM are normal. Pupils are equal, round, and reactive to light.  Neck: Neck supple. No JVD present. Carotid bruit is not present. No tracheal deviation present.  Cardiovascular: Normal rate, regular rhythm and normal heart sounds.   Pulses:      Dorsalis pedis pulses are 2+ on the right side, and 2+ on the left side.  Pulmonary/Chest: Effort normal and breath sounds normal. No respiratory distress. She has no wheezes. She has no rales.  Abdominal: Soft. There is no tenderness. There is no rebound and no guarding.  hyperactive bowel sounds.   Musculoskeletal: Normal range of motion.  Neurological: She is alert and oriented to person, place, and time.  5/5 strength in right upper extremities.  4- strength in left upper extremities    Skin: Skin is warm and dry.  Psychiatric: She has a normal mood and affect. Her behavior is normal.  Nursing note  and vitals reviewed.   ED Course  Procedures (including critical care time) DIAGNOSTIC STUDIES: Oxygen Saturation is 94% on RA, adequate by my interpretation.    COORDINATION OF CARE: 11:59 PM-Discussed treatment plan with pt at bedside and pt agreed to plan.     Labs Review Labs Reviewed  BASIC METABOLIC PANEL  CBC  URINALYSIS, ROUTINE W REFLEX MICROSCOPIC (NOT AT [facility])  CBG MONITORING, ED    Imaging Review No results found.   EKG Interpretation None      MDM    Final diagnoses:  None   Will admit for central vertigo  Seen by Dr. Arnoldo Morale    I personally performed the services described in this documentation, which was scribed in my presence. The recorded information has been reviewed and is accurate.       Veatrice Kells, MD 11/16/14 [phone]

## 2014-11-15 NOTE — ED Notes (Signed)
Brought in by EMS from home with c/o dizziness with nausea and vomiting.  Pt reported that she has been dizzy today everytime she tries to get out of bed and also, she "throws up" with her every attempt to get out of bed.  No abdominal pain.  No significant medical hx but arthritis.

## 2014-11-16 ENCOUNTER — Observation Stay (HOSPITAL_COMMUNITY): Payer: Medicare Other

## 2014-11-16 ENCOUNTER — Encounter (HOSPITAL_COMMUNITY): Payer: Self-pay | Admitting: Emergency Medicine

## 2014-11-16 ENCOUNTER — Emergency Department (HOSPITAL_COMMUNITY): Payer: Medicare Other

## 2014-11-16 DIAGNOSIS — C4492 Squamous cell carcinoma of skin, unspecified: Secondary | ICD-10-CM

## 2014-11-16 DIAGNOSIS — I639 Cerebral infarction, unspecified: Secondary | ICD-10-CM | POA: Diagnosis not present

## 2014-11-16 DIAGNOSIS — J9809 Other diseases of bronchus, not elsewhere classified: Secondary | ICD-10-CM | POA: Diagnosis not present

## 2014-11-16 DIAGNOSIS — M1990 Unspecified osteoarthritis, unspecified site: Secondary | ICD-10-CM | POA: Diagnosis not present

## 2014-11-16 DIAGNOSIS — R51 Headache: Secondary | ICD-10-CM | POA: Diagnosis not present

## 2014-11-16 DIAGNOSIS — J9811 Atelectasis: Secondary | ICD-10-CM | POA: Diagnosis not present

## 2014-11-16 DIAGNOSIS — R55 Syncope and collapse: Secondary | ICD-10-CM | POA: Diagnosis present

## 2014-11-16 DIAGNOSIS — R42 Dizziness and giddiness: Secondary | ICD-10-CM

## 2014-11-16 DIAGNOSIS — R001 Bradycardia, unspecified: Secondary | ICD-10-CM | POA: Diagnosis not present

## 2014-11-16 DIAGNOSIS — H3530 Unspecified macular degeneration: Secondary | ICD-10-CM

## 2014-11-16 DIAGNOSIS — H6123 Impacted cerumen, bilateral: Secondary | ICD-10-CM

## 2014-11-16 LAB — TROPONIN I: Troponin I: <0.03 ng/mL (ref <0.031)

## 2014-11-16 LAB — LIPID PANEL
CHOLESTEROL: 232 mg/dL — AB (ref 0–200)
HDL: 26 mg/dL — AB (ref >40)
LDL Cholesterol: 156 mg/dL — ABNORMAL HIGH (ref 0–99)
Total CHOL/HDL Ratio: 8.9 RATIO
Triglycerides: 249 mg/dL — ABNORMAL HIGH (ref <150)
VLDL: 50 mg/dL — ABNORMAL HIGH (ref 0–40)

## 2014-11-16 LAB — URINALYSIS, ROUTINE W REFLEX MICROSCOPIC
Bilirubin Urine: NEGATIVE
Glucose, UA: NEGATIVE mg/dL
Hgb urine dipstick: NEGATIVE
KETONES UR: NEGATIVE mg/dL
NITRITE: NEGATIVE
PH: 8 (ref 5.0–8.0)
Protein, ur: NEGATIVE mg/dL
Specific Gravity, Urine: 1.009 (ref 1.005–1.030)
Urobilinogen, UA: 0.2 mg/dL (ref 0.0–1.0)

## 2014-11-16 LAB — CBG MONITORING, ED: Glucose-Capillary: 116 mg/dL — ABNORMAL HIGH (ref 65–99)

## 2014-11-16 LAB — URINE MICROSCOPIC-ADD ON

## 2014-11-16 LAB — CBC
HEMATOCRIT: 33.7 % — AB (ref 36.0–46.0)
Hemoglobin: 11 g/dL — ABNORMAL LOW (ref 12.0–15.0)
MCH: 28.6 pg (ref 26.0–34.0)
MCHC: 32.6 g/dL (ref 30.0–36.0)
MCV: 87.8 fL (ref 78.0–100.0)
Platelets: UNDETERMINED 10*3/uL (ref 150–400)
RBC: 3.84 MIL/uL — ABNORMAL LOW (ref 3.87–5.11)
RDW: 13.8 % (ref 11.5–15.5)
WBC: 5 10*3/uL (ref 4.0–10.5)

## 2014-11-16 LAB — BASIC METABOLIC PANEL
Anion gap: 8 (ref 5–15)
BUN: 15 mg/dL (ref 6–20)
CHLORIDE: 109 mmol/L (ref 101–111)
CO2: 24 mmol/L (ref 22–32)
CREATININE: 0.86 mg/dL (ref 0.44–1.00)
Calcium: 8.6 mg/dL — ABNORMAL LOW (ref 8.9–10.3)
GFR calc non Af Amer: 59 mL/min — ABNORMAL LOW (ref >60)
GLUCOSE: 115 mg/dL — AB (ref 65–99)
Potassium: 4.1 mmol/L (ref 3.5–5.1)
Sodium: 141 mmol/L (ref 135–145)

## 2014-11-16 MED ORDER — PNEUMOCOCCAL VAC POLYVALENT 25 MCG/0.5ML IJ INJ
0.5000 mL | INJECTION | INTRAMUSCULAR | Status: AC
  Administered 2014-11-17: 0.5 mL via INTRAMUSCULAR
  Filled 2014-11-16: qty 0.5

## 2014-11-16 MED ORDER — SODIUM CHLORIDE 0.9 % IV BOLUS (SEPSIS)
250.0000 mL | Freq: Once | INTRAVENOUS | Status: AC
  Administered 2014-11-16: 250 mL via INTRAVENOUS

## 2014-11-16 MED ORDER — PNEUMOCOCCAL VAC POLYVALENT 25 MCG/0.5ML IJ INJ: 0.5000 mL | INJECTION | INTRAMUSCULAR | Status: DC

## 2014-11-16 MED ORDER — SENNOSIDES-DOCUSATE SODIUM 8.6-50 MG PO TABS: 1.0000 | ORAL_TABLET | Freq: Every evening | ORAL | Status: DC | PRN

## 2014-11-16 MED ORDER — ACETAMINOPHEN 325 MG PO TABS
650.0000 mg | ORAL_TABLET | Freq: Four times a day (QID) | ORAL | Status: DC | PRN
  Administered 2014-11-16 (×2): 650 mg via ORAL
  Filled 2014-11-16 (×2): qty 2

## 2014-11-16 MED ORDER — ENOXAPARIN SODIUM 30 MG/0.3ML ~~LOC~~ SOLN
30.0000 mg | SUBCUTANEOUS | Status: DC
  Administered 2014-11-16 – 2014-11-17 (×2): 30 mg via SUBCUTANEOUS
  Filled 2014-11-16 (×2): qty 0.3

## 2014-11-16 MED ORDER — SODIUM CHLORIDE 0.9 % IV SOLN
Freq: Once | INTRAVENOUS | Status: AC
  Administered 2014-11-16: 01:00:00 via INTRAVENOUS

## 2014-11-16 MED ORDER — MECLIZINE HCL 25 MG PO TABS
12.5000 mg | ORAL_TABLET | Freq: Once | ORAL | Status: AC
  Administered 2014-11-16: 12.5 mg via ORAL
  Filled 2014-11-16: qty 1

## 2014-11-16 MED ORDER — MECLIZINE HCL 12.5 MG PO TABS
25.0000 mg | ORAL_TABLET | Freq: Two times a day (BID) | ORAL | Status: DC
Start: 2014-11-16 — End: 2014-11-17
  Administered 2014-11-16: 25 mg via ORAL
  Filled 2014-11-16: qty 2

## 2014-11-16 MED ORDER — STROKE: EARLY STAGES OF RECOVERY BOOK
Freq: Once | Status: AC
  Administered 2014-11-16: 07:00:00

## 2014-11-16 MED ORDER — SODIUM CHLORIDE 0.9 % IV BOLUS (SEPSIS)
500.0000 mL | Freq: Once | INTRAVENOUS | Status: DC
Start: 2014-11-16 — End: 2014-11-17

## 2014-11-16 MED ORDER — SCOPOLAMINE 1 MG/3DAYS TD PT72
1.0000 | MEDICATED_PATCH | TRANSDERMAL | Status: DC
  Administered 2014-11-16: 1.5 mg via TRANSDERMAL
  Filled 2014-11-16 (×2): qty 1

## 2014-11-16 NOTE — ED Notes (Signed)
Informed the 4th floor charge nurse and the ED nurse that the pt has a ready bed and will be on the floor in 20 min @ 0420 by QA

## 2014-11-16 NOTE — ED Notes (Signed)
[facility] called back, report on pt was provided.

## 2014-11-16 NOTE — Progress Notes (Signed)
Patient seen and examined  Brought in via EMS for acute onset of dizziness and vertigo 2 hours prior to arrival  MRI of the brain is pending, vascular ultrasound pending, likely BPPV   If negative will obtain MRI of the C-spine given history of rheumatoid arthritis  Patient has been started on scopolamine patch and meclizine when necessary  PT/OT eval

## 2014-11-16 NOTE — Progress Notes (Signed)
Blood pressure taken again manually, 84/43 in right arm. Patient oriented x4. Neuro assessment unchanged from morning assessment, NIHHS 1, however patient is drowsy. Easily arousable to voice. Patient's family states they are very concerned for patient, states her blood pressure got very low the last time patient was at hospital, and that they were never given answer to why this occurred, and patient has "not been the same since." Patient was admitted early AM, and did not sleep during the night per family. Family states this is abnormal for patient to be this tired as patient "never sleeps." Family also states concern of new medications. MD was paged previously. Night RN made aware.

## 2014-11-16 NOTE — Progress Notes (Addendum)
Spoke with pharmacist concerning scolopamine patch and also meclizine PO. Inquired if it would affect patient's blood pressure. Notified by pharmacist Gwyndolyn Saxon that I should notify on-call provider to take patch off and to hold meclizine for a few hours. Notified Schorr NP via text page. Awaiting response. Will continue to monitor.    11:16 Spoke with Schorr NP who is agreement with pharmacist, will hold meclizine for now. Will also remove scolopamine patch. Will continue to monitor.

## 2014-11-16 NOTE — Progress Notes (Signed)
Patient's BP 93/38 manual in right arm. Patient complains of dizziness, per patient dizziness has been present prior to admission. Patient otherwise asymptomatic. MD notified, 250 cc NS bolus over 1 hour ordered. Bolus started. Patient made aware.

## 2014-11-16 NOTE — ED Notes (Signed)
[facility] here to transport pt to [facility]. 

## 2014-11-16 NOTE — Progress Notes (Signed)
Patient's family called RN into room. Patient's family stating that patient is acting confused at times, saying things that were untrue about visit from family. Patient's family asking if medications are making patient sleepy. Patient was started on scolpamine patch and meclizine today. Patient states she "feels fine," states headache is gone. BP 86/31 manually after second 250 cc NS bolus. Night RN aware. MD paged. Bed alarm on, call bell within patient's reach. Will continue to monitor.

## 2014-11-16 NOTE — H&P (Signed)
[facility] Admission History and Physical       Joann Herrera BJS:[id number] DOB: [birth year] DOA: 11/15/2014  Referring physician:  PCP: Dr. Inda Castle Specialists:   Chief Complaint: Dizziness  HPI: Joann Herrera is a 81 y.o. female with a history of Rheumatoid Arthritis, and Macular Degeneration who presents to the ED with complaints of Headache, Dizziness with Nausea and Vomiting of sudden onset this evening about 3 hours prior to arrival.   She reports feeling as if the room was spinning, and as if she would pass out.   The headache is frontal and has been persistent for a week.        Review of Systems:  Constitutional: No Weight Loss, No Weight Gain, Night Sweats, Fevers, Chills, +Dizziness,+Light Headedness, Fatigue, or Generalized Weakness HEENT: +Headaches, Difficulty Swallowing,Tooth/Dental Problems,Sore Throat,  No Sneezing, Rhinitis, Ear Ache, Nasal Congestion, or Post Nasal Drip,  Cardio-vascular:  No Chest pain, Orthopnea, PND, Edema in Lower Extremities, Anasarca, Dizziness, Palpitations  Resp: No Dyspnea, No DOE, No Productive Cough, No Non-Productive Cough, No Hemoptysis, No Wheezing.    GI: No Heartburn, Indigestion, Abdominal Pain, Nausea, Vomiting, Diarrhea, Constipation, Hematemesis, Hematochezia, Melena, Change in Bowel Habits,  Loss of Appetite  GU: No Dysuria, No Change in Color of Urine, No Urgency or Urinary Frequency, No Flank pain.  Musculoskeletal: No Joint Pain or Swelling, No Decreased Range of Motion, No Back Pain.  Neurologic: No Syncope, No Seizures, Muscle Weakness, Paresthesia, Vision Disturbance or Loss, No Diplopia, +Vertigo, No Difficulty Walking,  Skin: No Rash or Lesions. Psych: No Change in Mood or Affect, No Depression or Anxiety, No Memory loss, No Confusion, or Hallucinations   Past Medical History  Diagnosis Date  . Rheumatoid Arthritis   . Macular degeneration   . SCCA (squamous cell carcinoma) of skin     of Nose followed  by Dr. Camillo Flaming     Past Surgical History  Procedure Laterality Date  . Total knee arthroplasty Right   . Total knee arthroplasty Left   . Hand surgery    . Cataract extraction, bilateral    . Tonsillectomy    . Shoulder arthroscopy Left   . Minor hemorrhoidectomy        Prior to Admission medications   Medication Sig Start Date End Date Taking? Authorizing Provider  acetaminophen (TYLENOL) 500 MG tablet Take 1,000 mg by mouth every 6 (six) hours as needed.   Yes Historical Provider, MD     Allergies  Allergen Reactions  . Aspirin Other (See Comments)    Hurts her bladder  . Codeine Nausea And Vomiting  . Morphine And Related Nausea And Vomiting  . Penicillins Hives and Swelling    Social History:  reports that she has never smoked. She has never used smokeless tobacco. She reports that she does not drink alcohol or use illicit drugs.    Family History  Problem Relation Age of Onset  . Diabetes Brother   . Diabetes Brother   . Arthritis-Osteo Mother   . Breast cancer Sister   . Suicidality Brother        Physical Exam:  GEN:  Pleasant Thin Elderly 81 y.o. Caucasian female examined and in no acute distress; cooperative with exam Filed Vitals:   11/16/14 0000 11/16/14 0030 11/16/14 0100 11/16/14 0130  BP: 113/59 112/53 117/58 115/76  Pulse: 65 67 64 74  Temp:      TempSrc:      Resp: 11 17 16 18   SpO2: 94% 95% 97%  95%   Blood pressure 115/76, pulse 74, temperature 97.4 F (36.3 C), temperature source Oral, resp. rate 18, SpO2 95 %. PSYCH: She is alert and oriented x4; does not appear anxious does not appear depressed; affect is normal HEENT: Normocephalic and Atraumatic, Mucous membranes pink; PERRLA; EOM intact; Fundi:  Benign;  No scleral icterus, Nares: Patent,   Increased Cerumen Bilateral Ears Obscuring TMs; Oropharynx: Clear, Fair Dentition,    Neck:  FROM, No Cervical Lymphadenopathy nor Thyromegaly or Carotid Bruit; No JVD; Breasts:: Not  examined CHEST WALL: No tenderness CHEST: Normal respiration, clear to auscultation bilaterally HEART: Regular rate and rhythm; no murmurs rubs or gallops BACK: No kyphosis or scoliosis; No CVA tenderness ABDOMEN: Positive Bowel Sounds, Soft Non-Tender, No Rebound or Guarding; No Masses, No Organomegaly Rectal Exam: Not done EXTREMITIES: + RA  Changes in Both hands,   No Cyanosis, Clubbing, or Edema; No Ulcerations. Genitalia: not examined PULSES: 2+ and symmetric SKIN: Normal hydration no rash or ulceration  CNS:  Alert and Oriented x 4, No Focal Deficits,     Mental Status:  Alert, Oriented, Thought Content Appropriate. Speech Fluent without evidence of Aphasia. Able to follow 3 step commands without difficulty.  In No obvious pain.   Cranial Nerves:  II: Discs flat bilaterally; Visual fields Intact, Chronic Decreased vision in Both eyes. Pupils equal and reactive.    III,IV, VI: Extra-ocular motions intact bilaterally    V,VII: smile symmetric, facial light touch sensation normal bilaterally    VIII: hearing intact or decreasesd bilaterally    IX,X: gag reflex present    XI: bilateral shoulder shrug    XII: midline tongue extension   Motor:  Right:  Upper extremity 5/5       Left:  Upper extremity 5/5 ( Except unable to Grasp with Left Hand due to Arthritis and Surgical changes)    Right:  Lower extremity 5/5    Left:  Lower extremity 5/5     Tone and Bulk:  normal tone throughout; no atrophy noted   Sensory:  Pinprick and light touch intact throughout, bilaterally   Deep Tendon Reflexes: 2+ and symmetric throughout   Plantars/ Babinski:  Right: equivocal  Left: equivocal  Cerebellar:  Finger to nose without difficulty   Gait: deferred    Vascular: pulses palpable throughout    Labs on Admission:  Basic Metabolic Panel:  Recent Labs Lab 11/15/14 2347  NA 141  K 4.1  CL 109  CO2 24  GLUCOSE 115*  BUN 15  CREATININE 0.86  CALCIUM 8.6*   Liver Function  Tests: No results for input(s): AST, ALT, ALKPHOS, BILITOT, PROT, ALBUMIN in the last 168 hours. No results for input(s): LIPASE, AMYLASE in the last 168 hours. No results for input(s): AMMONIA in the last 168 hours. CBC:  Recent Labs Lab 11/15/14 2347  WBC 5.0  HGB 11.0*  HCT 33.7*  MCV 87.8  PLT PLATELET CLUMPS NOTED ON SMEAR, UNABLE TO ESTIMATE   Cardiac Enzymes: No results for input(s): CKTOTAL, CKMB, CKMBINDEX, TROPONINI in the last 168 hours.  BNP (last 3 results) No results for input(s): BNP in the last 8760 hours.  ProBNP (last 3 results) No results for input(s): PROBNP in the last 8760 hours.  CBG:  Recent Labs Lab 11/15/14 2349  GLUCAP 116*    Radiological Exams on Admission: Dg Chest 2 View  11/16/2014   CLINICAL DATA:  Acute onset of dizziness, nausea and vomiting. Initial encounter.  EXAM: CHEST  2 VIEW  COMPARISON:  None.  FINDINGS: The lungs are well-aerated. Mild bibasilar atelectasis is noted. Mild peribronchial thickening is seen. There is no evidence of pleural effusion or pneumothorax.  The heart is normal in size; the mediastinal contour is within normal limits. No acute osseous abnormalities are seen. Mild chronic appearing compression deformity is noted at the mid thoracic spine.  IMPRESSION: Mild bibasilar atelectasis noted. Mild peribronchial thickening seen.   Electronically Signed   By: Garald Balding M.D.   On: 11/16/2014 03:11   Ct Head Wo Contrast  11/16/2014   CLINICAL DATA:  81 year old female with dizziness  EXAM: CT HEAD WITHOUT CONTRAST  TECHNIQUE: Contiguous axial images were obtained from the base of the skull through the vertex without intravenous contrast.  COMPARISON:  None.  FINDINGS: The ventricles are dilated and the sulci are prominent compatible with age-related atrophy. Periventricular and deep white matter hypodensities represent chronic microvascular ischemic changes. There is no intracranial hemorrhage. No mass effect or midline  shift identified.  The visualized paranasal sinuses and mastoid air cells are well aerated. The calvarium is intact.  IMPRESSION: No acute intracranial pathology.  Age-related atrophy and chronic microvascular ischemic disease.  If symptoms persist and there are no contraindications, MRI may provide better evaluation if clinically indicated.   Electronically Signed   By: Anner Crete M.D.   On: 11/16/2014 03:09      EKG: Independently reviewed.   #1 Normal Sinus Rhythm Rate =64, +RBBB  #2 Sinus Bradycardia Rate =53 + RBBB          Assessment/Plan:      81 y.o. female with  Principal Problem:   1.     Vertigo/Rule Out CVA (cerebral infarction)   Telemetry Monitoring   Neuro Checks   MRI/MRA of Brain, 2D ECHO and Carotid US in AM   Check Fasting Lipids, and HbA1C in AM   ASA Allergy      Active Problems:   2.     Bradycardia- Fluctuating    Cardiac monitoring     3.     Pre-syncope   Cardiac monitoring   Check Orhtostatics     4.  Cerumen Impaction- possibly adding to Sxs of #1   ENT Referral For Irrigation     5.  Hypocalcemia- Ca++ = 8.6   Check Albumin level and Calculate Corrected Calcium    Albumin ordered     6.   Arthritis- Presumably Rheumatoid Arthritis   Only take Tylenol Rx     7.     SCCA (squamous cell carcinoma) of skin of Nose-Followed by Dr Camillo Flaming Dermat[facility]       8.     Macular degeneration   Chronic Sees Opthalmology     9.     DVT Prophylaxis   Lovenox     Code Status:     FULL CODE       Family Communication:   2 Daughters at Bedside     Disposition Plan:   Observation Status        Time spent:  19 Minutes      Theressa Millard [facility] Pager [phone]   If [place] Please Contact the Day Rounding Team MD for [facility]  If 7PM-7AM, Please Contact Night-Floor Coverage  [email or web address] Password [facility] 11/16/2014, 4:38 AM     ADDENDUM:   Patient was seen and examined on 11/16/2014

## 2014-11-17 ENCOUNTER — Ambulatory Visit (HOSPITAL_BASED_OUTPATIENT_CLINIC_OR_DEPARTMENT_OTHER): Payer: Medicare Other

## 2014-11-17 ENCOUNTER — Ambulatory Visit (HOSPITAL_COMMUNITY): Payer: Medicare Other

## 2014-11-17 DIAGNOSIS — I639 Cerebral infarction, unspecified: Secondary | ICD-10-CM

## 2014-11-17 DIAGNOSIS — I6789 Other cerebrovascular disease: Secondary | ICD-10-CM | POA: Diagnosis not present

## 2014-11-17 DIAGNOSIS — R42 Dizziness and giddiness: Secondary | ICD-10-CM | POA: Diagnosis not present

## 2014-11-17 LAB — CBC
HEMATOCRIT: 32.1 % — AB (ref 36.0–46.0)
Hemoglobin: 10.4 g/dL — ABNORMAL LOW (ref 12.0–15.0)
MCH: 29.4 pg (ref 26.0–34.0)
MCHC: 32.4 g/dL (ref 30.0–36.0)
MCV: 90.7 fL (ref 78.0–100.0)
Platelets: UNDETERMINED 10*3/uL (ref 150–400)
RBC: 3.54 MIL/uL — ABNORMAL LOW (ref 3.87–5.11)
RDW: 14.3 % (ref 11.5–15.5)
WBC: 4.8 10*3/uL (ref 4.0–10.5)

## 2014-11-17 LAB — COMPREHENSIVE METABOLIC PANEL
ALT: 9 U/L — ABNORMAL LOW (ref 14–54)
AST: 13 U/L — AB (ref 15–41)
Albumin: 2.7 g/dL — ABNORMAL LOW (ref 3.5–5.0)
Alkaline Phosphatase: 52 U/L (ref 38–126)
Anion gap: 4 — ABNORMAL LOW (ref 5–15)
BILIRUBIN TOTAL: 0.4 mg/dL (ref 0.3–1.2)
BUN: 13 mg/dL (ref 6–20)
CO2: 25 mmol/L (ref 22–32)
Calcium: 8.3 mg/dL — ABNORMAL LOW (ref 8.9–10.3)
Chloride: 114 mmol/L — ABNORMAL HIGH (ref 101–111)
Creatinine, Ser: 1.06 mg/dL — ABNORMAL HIGH (ref 0.44–1.00)
GFR, EST AFRICAN AMERICAN: 53 mL/min — AB (ref >60)
GFR, EST NON AFRICAN AMERICAN: 45 mL/min — AB (ref >60)
Glucose, Bld: 94 mg/dL (ref 65–99)
Potassium: 4.4 mmol/L (ref 3.5–5.1)
SODIUM: 143 mmol/L (ref 135–145)
Total Protein: 5.2 g/dL — ABNORMAL LOW (ref 6.5–8.1)

## 2014-11-17 LAB — TSH: TSH: 1.932 u[IU]/mL (ref 0.350–4.500)

## 2014-11-17 LAB — HEMOGLOBIN A1C
HEMOGLOBIN A1C: 5.6 % (ref 4.8–5.6)
MEAN PLASMA GLUCOSE: 114 mg/dL

## 2014-11-17 MED ORDER — DIMENHYDRINATE 50 MG PO TABS: 25.0000 mg | ORAL_TABLET | Freq: Three times a day (TID) | ORAL | Status: DC | PRN

## 2014-11-17 MED ORDER — SODIUM CHLORIDE 0.9 % IV SOLN: INTRAVENOUS | Status: AC

## 2014-11-17 MED ORDER — SODIUM CHLORIDE 0.9 % IV BOLUS (SEPSIS)
250.0000 mL | Freq: Once | INTRAVENOUS | Status: AC
  Administered 2014-11-17: 250 mL via INTRAVENOUS

## 2014-11-17 MED ORDER — CARBAMIDE PEROXIDE 6.5 % OT SOLN
5.0000 [drp] | Freq: Two times a day (BID) | OTIC | Status: AC
Start: 2014-11-17 — End: 2014-11-21

## 2014-11-17 MED ORDER — SODIUM CHLORIDE 0.9 % IV SOLN: INTRAVENOUS | Status: DC

## 2014-11-17 NOTE — Evaluation (Signed)
Physical Therapy Evaluation Patient Details Name: Joann Herrera MRN: [id number] DOB: 1924/02/22 Today's Date: 11/17/2014   History of Present Illness  81 yo female admitted from home with dizziness and d/ n/v  NIHSS = 1  MRI (-)  CT (-) PMH:  RA arthritis, macular degeneration, R TKA, L TKA, hand surg, bil cataract extraction, L shoulder arthoscopy,  Clinical Impression  Pt assessed for BPPV however unable to reproduce dizziness and was negative for nystagmus during dix-hall pike and horizontal head roll. BP in supine was 102/42 and sitting 111/53. Pt denied room spinning/dizziness t/o entire session. Pt does demo significant balance impairment and is at increased falls risk. Spoke extensively with daughter regarding d/c recommendation/planning. Daughter and family aware she is unsafe to d/c home alone. Discussed ALF/ILF, moving in with daughter or brother. Daughter very emotional. Spoke with CSW regarding resources for family. Recommend HHPT to address home safety and to trial RW in home, unclear if RW would improved stability or cause more damage due to patient with macular degeneration. Acute PT to follow while in hospital to address mentioned deficits.    Follow Up Recommendations Home health PT;Supervision/Assistance - 24 hour    Equipment Recommendations  None recommended by PT (will let HHPT determine if RW needed)    Recommendations for Other Services       Precautions / Restrictions Precautions Precautions: Fall Restrictions Weight Bearing Restrictions: No      Mobility  Bed Mobility Overal bed mobility: Modified Independent             General bed mobility comments: used bed rail, increased time  Transfers Overall transfer level: Needs assistance Equipment used: None Transfers: Sit to/from Stand Sit to Stand: Min guard         General transfer comment: min guard for safety, pt unaware of IV line/management. unsteady upon initial  stand  Ambulation/Gait Ambulation/Gait assistance: Min assist Ambulation Distance (Feet): 210 Feet Assistive device: None Gait Pattern/deviations: Step-through pattern;Decreased stride length;Wide base of support;Staggering left;Staggering right Gait velocity: slow   General Gait Details: pt unsteady. pt reports "I'm like a car that's not aligned, I like to vear L." Pt with occasional cross over gait pattern to catch her balance. pt reports instability since 1.5 years ago.  Stairs Stairs: Yes Stairs assistance: Min assist Stair Management: One rail Left;Alternating pattern;Forwards Number of Stairs: 3 General stair comments: v/c's for safety/line management  Wheelchair Mobility    Modified Rankin (Stroke Patients Only)       Balance Overall balance assessment: History of Falls;Needs assistance Sitting-balance support: Feet supported Sitting balance-Leahy Scale: Fair     Standing balance support: No upper extremity supported Standing balance-Leahy Scale: Fair Standing balance comment: pt okay statically but requires external assist with mobility/dynamic functioning                             Pertinent Vitals/Pain Pain Assessment: No/denies pain    Home Living Family/patient expects to be discharged to:: Private residence Living Arrangements: Alone Available Help at Discharge: Family;Available PRN/intermittently Type of Home: House Home Access: Stairs to enter Entrance Stairs-Rails: Left Entrance Stairs-Number of Steps: 3 Home Layout: One level Home Equipment: None      Prior Function Level of Independence: Needs assistance   Gait / Transfers Assistance Needed: pt does not use cane or RW but has had falls, per pt every 102months or so  ADL's / Homemaking Assistance Needed: pt doesn't drive, son comes  every saturday and takes her to get groceries  Comments: pt wtih compromised vision due to macular degeneration     Hand Dominance   Dominant Hand:  Right    Extremity/Trunk Assessment   Upper Extremity Assessment: Defer to OT evaluation           Lower Extremity Assessment: Overall WFL for tasks assessed      Cervical / Trunk Assessment: Kyphotic  Communication   Communication: No difficulties  Cognition Arousal/Alertness: Awake/alert Behavior During Therapy: Impulsive Overall Cognitive Status: Impaired/Different from baseline Area of Impairment: Safety/judgement;Awareness         Safety/Judgement: Decreased awareness of safety Awareness: Emergent   General Comments: pt impulsive requiring v/c's for safety    General Comments      Exercises        Assessment/Plan    PT Assessment Patient needs continued PT services  PT Diagnosis Difficulty walking;Generalized weakness   PT Problem List Decreased strength;Decreased activity tolerance;Decreased balance;Decreased mobility;Decreased safety awareness  PT Treatment Interventions DME instruction;Gait training;Stair training;Functional mobility training;Therapeutic activities;Balance training;Therapeutic exercise   PT Goals (Current goals can be found in the Care Plan section) Acute Rehab PT Goals Patient Stated Goal: home PT Goal Formulation: With patient/family Time For Goal Achievement: 11/24/14 Potential to Achieve Goals: Good Additional Goals Additional Goal #1: Pt to score >19 on DGI to indicate minimal falls risk.    Frequency Min 3X/week   Barriers to discharge Decreased caregiver support pt home alone    Co-evaluation               End of Session Equipment Utilized During Treatment: Gait belt Activity Tolerance: Patient tolerated treatment well Patient left: in chair;with call bell/phone within reach;with chair alarm set;with family/visitor present Nurse Communication: Mobility status    Functional Assessment Tool Used: clinical judgement Functional Limitation: Mobility: Walking and moving around Mobility: Walking and Moving Around  Current Status (R6045): At least 20 percent but less than 40 percent impaired, limited or restricted Mobility: Walking and Moving Around Goal Status [phone]): At least 1 percent but less than 20 percent impaired, limited or restricted    Time: [id number] PT Time Calculation (min) (ACUTE ONLY): 68 min   Charges:   PT Evaluation $Initial PT Evaluation Tier I: 1 Procedure PT Treatments $Gait Training: 23-37 mins $Therapeutic Activity: 8-22 mins $Canalith Rep Proc: 8-22 mins   PT G Codes:   PT G-Codes **NOT FOR INPATIENT CLASS** Functional Assessment Tool Used: clinical judgement Functional Limitation: Mobility: Walking and moving around Mobility: Walking and Moving Around Current Status (F6213): At least 20 percent but less than 40 percent impaired, limited or restricted Mobility: Walking and Moving Around Goal Status [phone]): At least 1 percent but less than 20 percent impaired, limited or restricted    Kingsley Callander 11/17/2014, 11:42 AM   Kittie Plater, PT, DPT Pager #: [phone] Office #: [phone]

## 2014-11-17 NOTE — Discharge Summary (Signed)
Physician Discharge Summary  Joann Herrera MRN: [id number] DOB/AGE: Oct 02, 1923 81 y.o.  PCP: No primary care provider on file.   Admit date: 11/15/2014 Discharge date: 11/17/2014  Discharge Diagnoses:   Principal Problem:   Vertigo Active Problems:   CVA (cerebral infarction)   Bradycardia   Arthritis   SCCA (squamous cell carcinoma) of skin   Macular degeneration   Impacted cerumen of both ears   Pre-syncope   Hypocalcemia    Follow-up recommendations Follow-up with PCP in 3-5 days , including although additional recommended appointments as below Follow-up CBC, CMP in 3-5 days      Medication List    TAKE these medications        acetaminophen 500 MG tablet  Commonly known as:  TYLENOL  Take 1,000 mg by mouth every 6 (six) hours as needed.     dimenhyDRINATE 50 MG tablet  Commonly known as:  DRAMAMINE  Take 0.5 tablets (25 mg total) by mouth every 8 (eight) hours as needed for dizziness.         Discharge Condition: *Stable    Disposition: Final discharge disposition not confirmed   Consults: None     Significant Diagnostic Studies:  Dg Chest 2 View  11/16/2014   CLINICAL DATA:  Acute onset of dizziness, nausea and vomiting. Initial encounter.  EXAM: CHEST  2 VIEW  COMPARISON:  None.  FINDINGS: The lungs are well-aerated. Mild bibasilar atelectasis is noted. Mild peribronchial thickening is seen. There is no evidence of pleural effusion or pneumothorax.  The heart is normal in size; the mediastinal contour is within normal limits. No acute osseous abnormalities are seen. Mild chronic appearing compression deformity is noted at the mid thoracic spine.  IMPRESSION: Mild bibasilar atelectasis noted. Mild peribronchial thickening seen.   Electronically Signed   By: Garald Balding M.D.   On: 11/16/2014 03:11   Ct Head Wo Contrast  11/16/2014   CLINICAL DATA:  81 year old female with dizziness  EXAM: CT HEAD WITHOUT CONTRAST  TECHNIQUE: Contiguous axial  images were obtained from the base of the skull through the vertex without intravenous contrast.  COMPARISON:  None.  FINDINGS: The ventricles are dilated and the sulci are prominent compatible with age-related atrophy. Periventricular and deep white matter hypodensities represent chronic microvascular ischemic changes. There is no intracranial hemorrhage. No mass effect or midline shift identified.  The visualized paranasal sinuses and mastoid air cells are well aerated. The calvarium is intact.  IMPRESSION: No acute intracranial pathology.  Age-related atrophy and chronic microvascular ischemic disease.  If symptoms persist and there are no contraindications, MRI may provide better evaluation if clinically indicated.   Electronically Signed   By: Anner Crete M.D.   On: 11/16/2014 03:09   Mr Brain Wo Contrast  11/16/2014   CLINICAL DATA:  81 year old female with history of rheumatoid arthritis. Headache for past week. Dizziness with nausea and vomiting of sudden onset. Subsequent encounter.  EXAM: MRI HEAD WITHOUT CONTRAST  MRA HEAD WITHOUT CONTRAST  TECHNIQUE: Multiplanar, multiecho pulse sequences of the brain and surrounding structures were obtained without intravenous contrast. Angiographic images of the head were obtained using MRA technique without contrast.  COMPARISON:  11/16/2014 CT.  FINDINGS: MRI HEAD FINDINGS  No acute infarct.  No intracranial hemorrhage.  Mild to moderate small vessel disease type changes.  Global atrophy without hydrocephalus.  No intracranial mass lesion noted on this unenhanced exam.  C3-4 bulge with mild spinal stenosis and minimal cord flattening. Minimal transverse ligament hypertrophy.  Cervical medullary junction, pineal region and pituitary region unremarkable.  Post lens replacement otherwise orbital structures unremarkable.  Minimal paranasal sinus mucosal thickening with partial opacification ethmoid sinus air cells bilaterally.  Major intracranial vascular  structures are patent.  Asymmetric appearance of the parotid glands of indeterminate etiology  MRA HEAD FINDINGS  Exam is motion degraded limiting evaluation for grading stenosis or detecting aneurysm. What can be stated on the present examination is that there is no medium or large size vessel significant stenosis or occlusion. Intracranial arteries are ectatic.  IMPRESSION: MRI HEAD  No acute infarct.  Mild to moderate small vessel disease type changes.  Global atrophy.  C3-4 bulge with mild spinal stenosis and minimal cord flattening. Minimal transverse ligament hypertrophy.  Asymmetric appearance of the parotid glands of indeterminate etiology  MRA HEAD  Exam is motion degraded limiting evaluation for grading stenosis or detecting aneurysm. What can be stated on the present examination is that there is no medium or large size vessel significant stenosis or occlusion. Intracranial arteries are ectatic.   Electronically Signed   By: Genia Del M.D.   On: 11/16/2014 10:05   Mr Jodene Nam Head/brain Wo Cm  11/16/2014   CLINICAL DATA:  81 year old female with history of rheumatoid arthritis. Headache for past week. Dizziness with nausea and vomiting of sudden onset. Subsequent encounter.  EXAM: MRI HEAD WITHOUT CONTRAST  MRA HEAD WITHOUT CONTRAST  TECHNIQUE: Multiplanar, multiecho pulse sequences of the brain and surrounding structures were obtained without intravenous contrast. Angiographic images of the head were obtained using MRA technique without contrast.  COMPARISON:  11/16/2014 CT.  FINDINGS: MRI HEAD FINDINGS  No acute infarct.  No intracranial hemorrhage.  Mild to moderate small vessel disease type changes.  Global atrophy without hydrocephalus.  No intracranial mass lesion noted on this unenhanced exam.  C3-4 bulge with mild spinal stenosis and minimal cord flattening. Minimal transverse ligament hypertrophy. Cervical medullary junction, pineal region and pituitary region unremarkable.  Post lens  replacement otherwise orbital structures unremarkable.  Minimal paranasal sinus mucosal thickening with partial opacification ethmoid sinus air cells bilaterally.  Major intracranial vascular structures are patent.  Asymmetric appearance of the parotid glands of indeterminate etiology  MRA HEAD FINDINGS  Exam is motion degraded limiting evaluation for grading stenosis or detecting aneurysm. What can be stated on the present examination is that there is no medium or large size vessel significant stenosis or occlusion. Intracranial arteries are ectatic.  IMPRESSION: MRI HEAD  No acute infarct.  Mild to moderate small vessel disease type changes.  Global atrophy.  C3-4 bulge with mild spinal stenosis and minimal cord flattening. Minimal transverse ligament hypertrophy.  Asymmetric appearance of the parotid glands of indeterminate etiology  MRA HEAD  Exam is motion degraded limiting evaluation for grading stenosis or detecting aneurysm. What can be stated on the present examination is that there is no medium or large size vessel significant stenosis or occlusion. Intracranial arteries are ectatic.   Electronically Signed   By: Genia Del M.D.   On: 11/16/2014 10:05    Filed Weights   11/16/14 0502 11/16/14 0630  Weight: 56.7 kg (125 lb) 56.7 kg (125 lb)     Microbiology: No results found for this or any previous visit (from the past 240 hour(s)).     Blood Culture No results found for: SDES, SPECREQUEST, CULT, REPTSTATUS    Labs: Results for orders placed or performed during the hospital encounter of 11/15/14 (from the past 48 hour(s))  Basic metabolic panel     Status: Abnormal   Collection Time: 11/15/14 11:47 PM  Result Value Ref Range   Sodium 141 135 - 145 mmol/L   Potassium 4.1 3.5 - 5.1 mmol/L   Chloride 109 101 - 111 mmol/L   CO2 24 22 - 32 mmol/L   Glucose, Bld 115 (H) 65 - 99 mg/dL   BUN 15 6 - 20 mg/dL   Creatinine, Ser 0.86 0.44 - 1.00 mg/dL   Calcium 8.6 (L) 8.9 - 10.3  mg/dL   GFR calc non Af Amer 59 (L) >60 mL/min   GFR calc Af Amer >60 >60 mL/min    Comment: (NOTE) The eGFR has been calculated using the CKD EPI equation. This calculation has not been validated in all clinical situations. eGFR's persistently <60 mL/min signify possible Chronic Kidney Disease.    Anion gap 8 5 - 15  CBC     Status: Abnormal   Collection Time: 11/15/14 11:47 PM  Result Value Ref Range   WBC 5.0 4.0 - 10.5 K/uL   RBC 3.84 (L) 3.87 - 5.11 MIL/uL   Hemoglobin 11.0 (L) 12.0 - 15.0 g/dL   HCT 33.7 (L) 36.0 - 46.0 %   MCV 87.8 78.0 - 100.0 fL   MCH 28.6 26.0 - 34.0 pg   MCHC 32.6 30.0 - 36.0 g/dL   RDW 13.8 11.5 - 15.5 %   Platelets PLATELET CLUMPS NOTED ON SMEAR, UNABLE TO ESTIMATE 150 - 400 K/uL    Comment: REPEATED TO VERIFY SPECIMEN CHECKED FOR CLOTS PLATELET COUNT CONFIRMED BY SMEAR   CBG monitoring, ED     Status: Abnormal   Collection Time: 11/15/14 11:49 PM  Result Value Ref Range   Glucose-Capillary 116 (H) 65 - 99 mg/dL   Comment 1 Notify RN   Urinalysis, Routine w reflex microscopic (not at [facility] [place])     Status: Abnormal   Collection Time: 11/16/14  1:39 AM  Result Value Ref Range   Color, Urine YELLOW YELLOW   APPearance CLEAR CLEAR   Specific Gravity, Urine 1.009 1.005 - 1.030   pH 8.0 5.0 - 8.0   Glucose, UA NEGATIVE NEGATIVE mg/dL   Hgb urine dipstick NEGATIVE NEGATIVE   Bilirubin Urine NEGATIVE NEGATIVE   Ketones, ur NEGATIVE NEGATIVE mg/dL   Protein, ur NEGATIVE NEGATIVE mg/dL   Urobilinogen, UA 0.2 0.0 - 1.0 mg/dL   Nitrite NEGATIVE NEGATIVE   Leukocytes, UA TRACE (A) NEGATIVE  Urine microscopic-add on     Status: None   Collection Time: 11/16/14  1:39 AM  Result Value Ref Range   Squamous Epithelial / LPF RARE RARE   WBC, UA 0-2 <3 WBC/hpf   Bacteria, UA RARE RARE  Troponin I     Status: None   Collection Time: 11/16/14  5:41 AM  Result Value Ref Range   Troponin I <0.03 <0.031 ng/mL    Comment:        NO INDICATION OF MYOCARDIAL  INJURY.   Lipid panel     Status: Abnormal   Collection Time: 11/16/14  8:00 AM  Result Value Ref Range   Cholesterol 232 (H) 0 - 200 mg/dL   Triglycerides 249 (H) <150 mg/dL   HDL 26 (L) >40 mg/dL   Total CHOL/HDL Ratio 8.9 RATIO   VLDL 50 (H) 0 - 40 mg/dL   LDL Cholesterol 156 (H) 0 - 99 mg/dL    Comment:        Total Cholesterol/HDL:CHD Risk Coronary Heart Disease Risk  Table                     Men   Women  1/2 Average Risk   3.4   3.3  Average Risk       5.0   4.4  2 X Average Risk   9.6   7.1  3 X Average Risk  23.4   11.0        Use the calculated Patient Ratio above and the CHD Risk Table to determine the patient's CHD Risk.        ATP III CLASSIFICATION (LDL):  <100     mg/dL   Optimal  100-129  mg/dL   Near or Above                    Optimal  130-159  mg/dL   Borderline  160-189  mg/dL   High  >190     mg/dL   Very High   Comprehensive metabolic panel     Status: Abnormal   Collection Time: 11/17/14  4:03 AM  Result Value Ref Range   Sodium 143 135 - 145 mmol/L   Potassium 4.4 3.5 - 5.1 mmol/L   Chloride 114 (H) 101 - 111 mmol/L   CO2 25 22 - 32 mmol/L   Glucose, Bld 94 65 - 99 mg/dL   BUN 13 6 - 20 mg/dL   Creatinine, Ser 1.06 (H) 0.44 - 1.00 mg/dL   Calcium 8.3 (L) 8.9 - 10.3 mg/dL   Total Protein 5.2 (L) 6.5 - 8.1 g/dL   Albumin 2.7 (L) 3.5 - 5.0 g/dL   AST 13 (L) 15 - 41 U/L   ALT 9 (L) 14 - 54 U/L   Alkaline Phosphatase 52 38 - 126 U/L   Total Bilirubin 0.4 0.3 - 1.2 mg/dL   GFR calc non Af Amer 45 (L) >60 mL/min   GFR calc Af Amer 53 (L) >60 mL/min    Comment: (NOTE) The eGFR has been calculated using the CKD EPI equation. This calculation has not been validated in all clinical situations. eGFR's persistently <60 mL/min signify possible Chronic Kidney Disease.    Anion gap 4 (L) 5 - 15  CBC     Status: Abnormal   Collection Time: 11/17/14  4:03 AM  Result Value Ref Range   WBC 4.8 4.0 - 10.5 K/uL   RBC 3.54 (L) 3.87 - 5.11 MIL/uL    Hemoglobin 10.4 (L) 12.0 - 15.0 g/dL   HCT 32.1 (L) 36.0 - 46.0 %   MCV 90.7 78.0 - 100.0 fL   MCH 29.4 26.0 - 34.0 pg   MCHC 32.4 30.0 - 36.0 g/dL   RDW 14.3 11.5 - 15.5 %   Platelets PLATELET CLUMPS NOTED ON SMEAR, UNABLE TO ESTIMATE 150 - 400 K/uL     Lipid Panel     Component Value Date/Time   CHOL 232* 11/16/2014 0800   TRIG 249* 11/16/2014 0800   HDL 26* 11/16/2014 0800   CHOLHDL 8.9 11/16/2014 0800   VLDL 50* 11/16/2014 0800   LDLCALC 156* 11/16/2014 0800        HPI :81 y.o. female brought in by ambulance, who presents to the Emergency Department complaining of dizziness descried as the room spinning onset tonight 2 hours PTA. Pt states that she has associated nausea, vomiting abdominal pain for 3 days and diarrhea.  Pt states that the dizziness was worse when moving her head and eyes. Pt report similar  symptoms 1 year prior. Pt denies any new medications. Denies tinnitus, cough, congestion or other related symptoms.   HOSPITAL COURSE:  Vertigo Likely due to ortho stasis in the setting of viral gastroenteritis , doubt  benign positional vertigo, MRI negative for CVA Telemetry monitoring uneventful, 2-D echo, performed and the results are pending Patient did have some nausea and vomiting, therefore hydrated with IV fluids, with improvement in her sx  Patient became extremely somnolent with scopolamine patch and meclizine, therefore both were discontinued and she was switched to Dramamine 25 mg every 8 hours when necessary PT OT evaluation was done prior to discharge  Patient probably from has low blood pressure in the 40H to 680 systolic at baseline Would benefit from wearing compression stockings, No evidence of any underlying infection, UA negative Cerumen  impaction possibly adding to the patient's symptoms that she may benefit from ENT referral  Hypertriglyceridemia,-PCP to start patient on simvastatin if agreeable,    Discharge Exam: Blood pressure 97/40, pulse  53, temperature 98.6 F (37 C), temperature source Oral, resp. rate 16, height $RemoveBe'5\' 4"'cxChYFNTa$  (1.626 m), weight 56.7 kg (125 lb), SpO2 96 %.   CHEST WALL: No tenderness CHEST: Normal respiration, clear to auscultation bilaterally HEART: Regular rate and rhythm; no murmurs rubs or gallops BACK: No kyphosis or scoliosis; No CVA tenderness ABDOMEN: Positive Bowel Sounds, Soft Non-Tender, No Rebound or Guarding; No Masses, No Organomegaly Rectal Exam: Not done EXTREMITIES: + RA Changes in Both hands, No Cyanosis, Clubbing, or Edema; No Ulcerations.      SignedReyne Dumas 11/17/2014, 9:51 AM        Time spent >45 mins

## 2014-11-17 NOTE — Care Management Note (Signed)
Case Management Note  Patient Details  Name: Joann Herrera MRN: [id number] Date of Birth: 12-11-1923  Subjective/Objective:                    Action/Plan: Met with patient and daughter to discuss discharge needs. Patient's daughter was requesting information regarding additional help in the home. CM provided daughter with a private duty list.  Patient's daughter has also been attempting to discuss ALF-type placement options with patient, who is resistant at this time.  CM provided patient and daughter with a list of ALFs for use in the future, should patient change her mind.  Bedside RN updated.  Expected Discharge Date:  11/19/14               Expected Discharge Plan:  Home/Self Care  In-House Referral:     Discharge planning Services  CM Consult  Post Acute Care Choice:    Choice offered to:  Adult Children  DME Arranged:    DME Agency:     HH Arranged:    HH Agency:     Status of Service:  Completed, signed off  Medicare Important Message Given:    Date Medicare IM Given:    Medicare IM give by:    Date Additional Medicare IM Given:    Additional Medicare Important Message give by:     If discussed at [place] of Stay Meetings, dates discussed:    Additional Comments:  Rolm Baptise, RN 11/17/2014, 2:09 PM

## 2014-11-17 NOTE — Progress Notes (Signed)
VASCULAR LAB PRELIMINARY  PRELIMINARY  PRELIMINARY  PRELIMINARY  Carotid Dopplers completed.    Preliminary report:  1-39% ICA stenosis.  Vertebral artery flow is antegrade.   Lynsey Ange, RVT 11/17/2014, 3:48 PM

## 2014-11-17 NOTE — Evaluation (Signed)
Occupational Therapy Evaluation Patient Details Name: Joann Herrera MRN: [id number] DOB: [birth year] Today's Date: 11/17/2014    History of Present Illness 81 yo female admitted from home with dizziness and d/ n/v  NIHSS = 1  MRI (-)  CT (-) PMH:  RA arthritis, macular degeneration, R TKA, L TKA, hand surg, bil cataract extraction, L shoulder arthoscopy,   Clinical Impression   PT admitted with dizziness and d/n/v. Pt currently with functional limitiations due to the deficits listed below (see OT problem list). Pt from home with hx of falls living independently. Pt demonstrates cognitive and balance deficits.  Pt will benefit from skilled OT to increase their independence and safety with adls and balance to allow discharge [place] with 24/7 (A). Case management to address home services provided and available out of pocket.        Home health OT;Supervision/Assistance - 24 hour    Equipment Recommendations  None recommended by OT    Recommendations for Other Services       Precautions / Restrictions Precautions Precautions: Fall Restrictions Weight Bearing Restrictions: No      Mobility Bed Mobility Overal bed mobility: Modified Independent             General bed mobility comments: in chair on arrival  Transfers Overall transfer level: Needs assistance Equipment used: None Transfers: Sit to/from Stand Sit to Stand: Min guard         General transfer comment: poor awareness to IV and reaching for environmental supports     Balance Overall balance assessment: History of Falls Sitting-balance support: Feet supported Sitting balance-Leahy Scale: Fair     Standing balance support: No upper extremity supported Standing balance-Leahy Scale: Fair Standing balance comment: pt okay statically but requires external assist with mobility/dynamic functioning                            ADL Overall ADL's : Needs assistance/impaired                          Toilet Transfer: Min guard       Tub/ Shower Transfer: Minimal Museum/gallery conservator Details (indicate cue type and reason): pt reaching for environmental supports to transfer into tub. tp reports that she only does it with help at home. Pt reports using a claw foot tube at home with a seat Functional mobility during ADLs: Moderate assistance;Minimal assistance General ADL Comments: pt demonstrates balance deficits with ambulation and incr fall risk with narrowed environment. Daughter reports space in home is very narrow due to furniture layout.      Vision Additional Comments: Pt demonstrates deficits with callign from a standard phone without incr time and mod v/c   Perception     Praxis      Pertinent Vitals/Pain Pain Assessment: No/denies pain     Hand Dominance Right   Extremity/Trunk Assessment Upper Extremity Assessment Upper Extremity Assessment: Overall WFL for tasks assessed   Lower Extremity Assessment Lower Extremity Assessment: Defer to PT evaluation   Cervical / Trunk Assessment Cervical / Trunk Assessment: Kyphotic   Communication Communication Communication: No difficulties   Cognition Arousal/Alertness: Awake/alert Behavior During Therapy: Impulsive Overall Cognitive Status: Impaired/Different from baseline Area of Impairment: Safety/judgement;Awareness;Problem solving         Safety/Judgement: Decreased awareness of safety Awareness: Emergent Problem Solving: Requires verbal cues General Comments: Pt reports falls but states that therapy doesnt help her. per  daughter patient is more unsteady now since last fall then ever before   General Comments       Exercises       Shoulder Instructions      Home Living Family/patient expects to be discharged to:: Private residence Living Arrangements: Alone Available Help at Discharge: Family;Available PRN/intermittently Type of Home: House Home Access: Stairs to enter [employer] of Steps: 3 Entrance Stairs-Rails: Left Home Layout: One level     Bathroom Shower/Tub: Teacher, early years/pre: Standard     Home Equipment: None   Additional Comments: pt has son visit every weekend to take grocery shopping or x2 nieces assist      Prior Functioning/Environment Level of Independence: Needs assistance  Gait / Transfers Assistance Needed: pt does not use cane or RW but has had falls, per pt every 37months or so ( reported to PT Ashly ) -- Pt reports to OT  "i have had falls but I dont know where or how many but I have fallen not for any reason just fell" ADL's / Homemaking Assistance Needed: pt doesn't drive, son comes every saturday and takes her to get groceries   Comments: pt uses a screen and glasses to help see TV at home due to visual deficits. pt reports glasses are useless except for TV    OT Diagnosis: Generalized weakness;Cognitive deficits;Disturbance of vision   OT Problem List: Decreased strength;Decreased range of motion;Decreased activity tolerance;Impaired balance (sitting and/or standing);Impaired vision/perception;Decreased cognition;Decreased safety awareness;Decreased knowledge of use of DME or AE;Decreased knowledge of precautions   OT Treatment/Interventions: Self-care/ADL training;Therapeutic exercise;Neuromuscular education;DME and/or AE instruction;Therapeutic activities;Cognitive remediation/compensation;Visual/perceptual remediation/compensation;Patient/family education;Balance training    OT Goals(Current goals can be found in the care plan section) Acute Rehab OT Goals Patient Stated Goal: home OT Goal Formulation: With patient/family Time For Goal Achievement: 12/01/14 Potential to Achieve Goals: Good  OT Frequency: Min 2X/week   Barriers to D/C: Decreased caregiver support  requesting CM Loma Sousa address home services available to patient and any out of pocket locations that family could arrange. Family  currently are attempting to arrange d/c but have no final plans at this time       Co-evaluation              End of Session Equipment Utilized During Treatment: Gait belt Nurse Communication: Mobility status;Precautions  Activity Tolerance: Patient tolerated treatment well Patient left: in chair;with call bell/phone within reach;with chair alarm set;with family/visitor present   Time: 1243-1311 OT Time Calculation (min): 28 min Charges:  OT General Charges $OT Visit: 1 Procedure OT Evaluation $Initial OT Evaluation Tier I: 1 Procedure OT Treatments $Self Care/Home Management : 8-22 mins G-Codes: OT G-codes **NOT FOR INPATIENT CLASS** Functional Assessment Tool Used: clinical judgement Functional Limitation: Self care Self Care Current Status (N3614): At least 40 percent but less than 60 percent impaired, limited or restricted Self Care Goal Status (E3154): At least 40 percent but less than 60 percent impaired, limited or restricted Self Care Discharge Status [phone]): At least 40 percent but less than 60 percent impaired, limited or restricted  Parke Poisson B 11/17/2014, 1:52 PM  Jeri Modena   OTR/L Pager: [phone] Office: [phone] .

## 2014-11-17 NOTE — Progress Notes (Signed)
  Echocardiogram 2D Echocardiogram has been performed.  Darlina Sicilian M 11/17/2014, 2:27 PM

## 2014-11-17 NOTE — Progress Notes (Signed)
Patient is discharged from room 4N18 at this time. Alert and in stable condition. IV site d/c'd as well as tele. Instructions read to patient and daughter and understanding verbalized. Left unit via wheelchair with all belongings. Patient is not positive for stroke so EMMI was not completed.

## 2014-11-17 NOTE — Progress Notes (Signed)
SLP Cancellation Note  Patient Details Name: Aishah Teffeteller MRN: [id number] DOB: 10/20/1923   Cancelled treatment:       Reason Eval/Treat Not Completed: SLP screened, no needs identified, will sign off   Ian Cavey, Katherene Ponto 11/17/2014, 2:27 PM

## 2014-11-19 ENCOUNTER — Encounter: Payer: Self-pay | Admitting: Family

## 2014-11-19 ENCOUNTER — Telehealth: Payer: Self-pay | Admitting: *Deleted

## 2014-11-19 NOTE — Telephone Encounter (Signed)
Unable to reach patient at time of TCM Call.  Left message for Baker Janus to return call when available.

## 2014-11-20 ENCOUNTER — Telehealth: Payer: Self-pay | Admitting: Family

## 2014-11-20 DIAGNOSIS — R42 Dizziness and giddiness: Secondary | ICD-10-CM | POA: Diagnosis not present

## 2014-11-20 NOTE — Telephone Encounter (Signed)
Caller name: Larwance Rote  Relation to pt: niece  Call back number: [phone]   Reason for call:  Patient was in the hospital received a missed call to schedule hospital follow up please advise what date/time you would like to see patient. No 30 minute slot available within  7 to 10 days.

## 2014-11-21 NOTE — Telephone Encounter (Signed)
Caryl Pina--  I see that you tried to call pt for TCM f/u. You can combine 1:45 and 2pm slots on 11/25/14 if still available.

## 2014-11-24 NOTE — Telephone Encounter (Signed)
Called patient and she states she is doing fine, but would not like to schedule a follow-up.  She states that she has to get rides from her niece and she is not sure when this can be arranged.  She states she will call office when she is able to figure out a time.

## 2014-12-01 ENCOUNTER — Telehealth: Payer: Self-pay | Admitting: Family

## 2014-12-01 NOTE — Telephone Encounter (Signed)
Caller name:Gail Berry Relation to UV:[id number] Call back number:[phone] Pharmacy:  Reason for call: pt is needing hospital follow up, pt was released last week, and pt caregiver states pt does not want to see anyone but you. Ok to put two slots together? Pt was seen for vomitting.

## 2014-12-01 NOTE — Telephone Encounter (Signed)
Ok to put in a 15 slot since I do not have any 30 minute slots please.

## 2014-12-08 ENCOUNTER — Encounter: Payer: Self-pay | Admitting: Family

## 2014-12-08 ENCOUNTER — Ambulatory Visit (INDEPENDENT_AMBULATORY_CARE_PROVIDER_SITE_OTHER): Payer: Medicare Other | Admitting: Family

## 2014-12-08 VITALS — BP 122/66 | HR 76 | Temp 97.6°F | Resp 16 | Ht 64.0 in | Wt 137.6 lb

## 2014-12-08 DIAGNOSIS — E781 Pure hyperglyceridemia: Secondary | ICD-10-CM | POA: Diagnosis not present

## 2014-12-08 DIAGNOSIS — R42 Dizziness and giddiness: Secondary | ICD-10-CM

## 2014-12-08 DIAGNOSIS — G4700 Insomnia, unspecified: Secondary | ICD-10-CM | POA: Diagnosis not present

## 2014-12-08 DIAGNOSIS — H8110 Benign paroxysmal vertigo, unspecified ear: Secondary | ICD-10-CM | POA: Diagnosis not present

## 2014-12-08 MED ORDER — ALPRAZOLAM 0.25 MG PO TABS: 0.2500 mg | ORAL_TABLET | Freq: Every evening | ORAL | Status: DC | PRN

## 2014-12-08 NOTE — Progress Notes (Signed)
Pre visit review using our clinic review tool, if applicable. No additional management support is needed unless otherwise documented below in the visit note. 

## 2014-12-08 NOTE — Patient Instructions (Addendum)
Please schedule lab draw at the front desk. Start xanax at bedtime as needed for insomnia. Follow up in 3 months, sooner if problems/concerns.

## 2014-12-08 NOTE — Progress Notes (Signed)
Subjective:    Patient ID: Joann Herrera, female    DOB: 09/29/1923, 81 y.o.   MRN: [id number]  HPI  Joann Herrera is an 81 yr old female who presents today for hospital follow up. Hospital record is reviewed. She was admitted on 7/23-7/25 with vertigo.  She presented to the ED complaining of dizziness.  It was felt that her dizziness was most likely due to orthostasis in the setting of viral gastroenteritis.  She was hydrated during her admission and also underwent MRI which was negative for CVA.  Meclizine and scopolomine patch made her drowsy. She was changed to dramamine.  She was noted to have cerumen impaction as well.    Dizziness has improved. Notes generalized weakness.    Hypertriglyceridemia-  Lab Results  Component Value Date   CHOL 232* 11/16/2014   HDL 26* 11/16/2014   LDLCALC 156* 11/16/2014   TRIG 249* 11/16/2014   CHOLHDL 8.9 11/16/2014      Review of Systems See HPI  Past Medical History  Diagnosis Date  . Pneumonia 04/2012  . Squamous cell carcinoma 07/30/2014    Invasive squamous cell carcinoma nasal dorsum- followed by derm Camillo Flaming MD  . Rheumatoid Arthritis   . Macular degeneration   . SCCA (squamous cell carcinoma) of skin     of Nose followed by Dr. Camillo Flaming    Social History   Social History  . Marital Status: Widowed    Spouse Name: N/A  . Number of Children: N/A  . Years of Education: N/A   Occupational History  . Not on file.   Social History Main Topics  . Smoking status: Never Smoker   . Smokeless tobacco: Never Used  . Alcohol Use: No  . Drug Use: No  . Sexual Activity: Not on file   Other Topics Concern  . Not on file   Social History Narrative   ** Merged History Encounter **       Daughter in [place]   Son in [place]   Son in [place]   Retired- [employer], worked at [employer] for 28 years   Enjoys dancing and going to [place]   Completed 11th grade         Past Surgical History  Procedure Laterality Date  . Total knee arthroplasty      x 2  . Hemorrhoid surgery    . Cataracts    . Hand surgery      Pt states she had both hands operated on for arthritis.  . Shoulder surgery Left     arthroscopy  . Total knee arthroplasty Right   . Total knee arthroplasty Left   . Hand surgery    . Cataract extraction, bilateral    . Tonsillectomy    . Shoulder arthroscopy Left   . Minor hemorrhoidectomy      Family History  Problem Relation Age of Onset  . Arthritis Mother   . Cancer Sister     breast  . Diabetes Sister   . Diabetes Brother   . Diabetes Brother   . Arthritis-Osteo Mother   . Breast cancer Sister   . Suicidality Brother     Allergies  Allergen Reactions  . Aspirin Other (See Comments)    Reaction: G I upset  . Aspirin Other (See Comments)    Hurts her bladder  . Codeine Nausea And Vomiting  . Codeine Nausea And Vomiting  . Morphine And Related  Nausea And Vomiting  . Morphine And Related Nausea And Vomiting  . Penicillins Swelling  . Penicillins Hives and Swelling    Current Outpatient Prescriptions on File Prior to Visit  Medication Sig Dispense Refill  . acetaminophen (TYLENOL) 500 MG tablet Take 1,000 mg by mouth every 6 (six) hours as needed. For pain    . Ranitidine HCl (ACID REDUCER PO) Take 1 tablet by mouth daily as needed.    . clonazePAM (KLONOPIN) 0.5 MG tablet Take 0.5 tablets (0.25 mg total) by mouth 2 (two) times daily as needed for anxiety. (Patient not taking: Reported on 12/08/2014) 20 tablet 1  . Diclofenac 18 MG CAPS Take 1 tablet by mouth 2 (two) times daily. (Patient not taking: Reported on 12/08/2014) 30 capsule 0   No current facility-administered medications on file prior to visit.    BP 122/66 mmHg  Pulse 76  Temp(Src) 97.6 F (36.4 C) (Oral)  Resp 16  Ht 5\' 4"  (1.626 m)  Wt 137 lb 9.6 oz (62.415 kg)  BMI 23.61 kg/m2  SpO2 97%       Objective:   Physical Exam    Constitutional: She is oriented to person, place, and time. She appears well-developed and well-nourished.  HENT:  Head: Normocephalic and atraumatic.  Right Ear: Tympanic membrane and ear canal normal.  Left Ear: Tympanic membrane and ear canal normal.  Cardiovascular: Normal rate, regular rhythm and normal heart sounds.   No murmur heard. Pulmonary/Chest: Effort normal and breath sounds normal. No respiratory distress. She has no wheezes.  Neurological: She is alert and oriented to person, place, and time.  Psychiatric: She has a normal mood and affect. Her behavior is normal. Judgment and thought content normal.          Assessment & Plan:  We did dicsuss PT referral- patient declines.

## 2014-12-11 DIAGNOSIS — E781 Pure hyperglyceridemia: Secondary | ICD-10-CM | POA: Insufficient documentation

## 2014-12-11 NOTE — Assessment & Plan Note (Signed)
Improving. Monitor. °

## 2014-12-11 NOTE — Assessment & Plan Note (Addendum)
rx provided for low dose xanax HS prn. Controlled substance contract is signed.

## 2014-12-11 NOTE — Assessment & Plan Note (Signed)
Discussed avoiding concentrated sweets, and limiting white carbs (rice/bread/pasta/potatoes). Instead substitute whole grain versions with reasonable portions.

## 2014-12-25 DIAGNOSIS — D485 Neoplasm of uncertain behavior of skin: Secondary | ICD-10-CM | POA: Diagnosis not present

## 2014-12-26 DIAGNOSIS — C44321 Squamous cell carcinoma of skin of nose: Secondary | ICD-10-CM | POA: Diagnosis not present

## 2015-01-02 ENCOUNTER — Encounter: Payer: Self-pay | Admitting: Family

## 2015-01-02 ENCOUNTER — Ambulatory Visit (INDEPENDENT_AMBULATORY_CARE_PROVIDER_SITE_OTHER): Payer: Medicare Other | Admitting: Family

## 2015-01-02 VITALS — BP 122/64 | HR 62 | Temp 97.5°F | Resp 18 | Ht 64.0 in | Wt 140.0 lb

## 2015-01-02 DIAGNOSIS — R1013 Epigastric pain: Secondary | ICD-10-CM | POA: Diagnosis not present

## 2015-01-02 DIAGNOSIS — G4700 Insomnia, unspecified: Secondary | ICD-10-CM | POA: Diagnosis not present

## 2015-01-02 LAB — CBC WITH DIFFERENTIAL/PLATELET
BASOS PCT: 0.9 % (ref 0.0–3.0)
Basophils Absolute: 0.1 10*3/uL (ref 0.0–0.1)
EOS ABS: 0.2 10*3/uL (ref 0.0–0.7)
Eosinophils Relative: 3.7 % (ref 0.0–5.0)
HCT: 35.5 % — ABNORMAL LOW (ref 36.0–46.0)
Hemoglobin: 11.9 g/dL — ABNORMAL LOW (ref 12.0–15.0)
Lymphocytes Relative: 36.5 % (ref 12.0–46.0)
Lymphs Abs: 2.2 10*3/uL (ref 0.7–4.0)
MCHC: 33.7 g/dL (ref 30.0–36.0)
MCV: 86.8 fl (ref 78.0–100.0)
MONO ABS: 0.5 10*3/uL (ref 0.1–1.0)
Monocytes Relative: 7.7 % (ref 3.0–12.0)
NEUTROS ABS: 3.1 10*3/uL (ref 1.4–7.7)
NEUTROS PCT: 51.2 % (ref 43.0–77.0)
PLATELETS: 276 10*3/uL (ref 150.0–400.0)
RBC: 4.09 Mil/uL (ref 3.87–5.11)
RDW: 14.5 % (ref 11.5–15.5)
WBC: 6 10*3/uL (ref 4.0–10.5)

## 2015-01-02 LAB — HEPATIC FUNCTION PANEL
ALBUMIN: 3.8 g/dL (ref 3.5–5.2)
ALT: 10 U/L (ref 0–35)
AST: 13 U/L (ref 0–37)
Alkaline Phosphatase: 70 U/L (ref 39–117)
BILIRUBIN TOTAL: 0.3 mg/dL (ref 0.2–1.2)
Bilirubin, Direct: 0.1 mg/dL (ref 0.0–0.3)
TOTAL PROTEIN: 6.8 g/dL (ref 6.0–8.3)

## 2015-01-02 LAB — LIPASE: Lipase: 39 U/L (ref 11.0–59.0)

## 2015-01-02 MED ORDER — TRAZODONE HCL 50 MG PO TABS: 25.0000 mg | ORAL_TABLET | Freq: Every evening | ORAL | Status: DC | PRN

## 2015-01-02 MED ORDER — OMEPRAZOLE 40 MG PO CPDR: 40.0000 mg | DELAYED_RELEASE_CAPSULE | Freq: Every day | ORAL | Status: DC

## 2015-01-02 NOTE — Progress Notes (Signed)
Pre visit review using our clinic review tool, if applicable. No additional management support is needed unless otherwise documented below in the visit note. 

## 2015-01-02 NOTE — Patient Instructions (Addendum)
Please complete lab work prior to leaving. You will be contacted about your Ultrasound and your referral to GI (Dr. Ardis Hughs) to evaluate your abdominal pain. Stop Zantac (OTC) start omeprazole for acid. Stop Xanax, start trazadone 50mg  (take 1/2 tab at bedtime as needed for sleep). Follow up in 6 weeks.

## 2015-01-02 NOTE — Progress Notes (Signed)
Subjective:    Patient ID: Joann Herrera, female    DOB: 07/01/1923, 81 y.o.   MRN: [id number]  HPI  Pt is an 81 yr old female who presents with epigastric pain, worse after eating (feels better when she does not eat). Pain began 1 year ago. She feels that her pain has worsened.  Feels like her stomach "is swelled."  She reports that due to her IBS she has alternating constipation/diarrhea. Denies black/bloody stools.  Denies fevers, nausea/vomitting.   She takes zantac prn. Helps if she takes it.   Wt Readings from Last 3 Encounters:  01/02/15 140 lb (63.504 kg)  12/08/14 137 lb 9.6 oz (62.415 kg)  11/16/14 125 lb (56.7 kg)   Insomnia- xanax did not help her sleep.     Review of Systems    see HPI  Past Medical History  Diagnosis Date  . Pneumonia 04/2012  . Squamous cell carcinoma 07/30/2014    Invasive squamous cell carcinoma nasal dorsum- followed by derm Camillo Flaming MD  . Rheumatoid Arthritis   . Macular degeneration   . SCCA (squamous cell carcinoma) of skin     of Nose followed by Dr. Camillo Flaming    Social History   Social History  . Marital Status: Widowed    Spouse Name: N/A  . Number of Children: N/A  . Years of Education: N/A   Occupational History  . Not on file.   Social History Main Topics  . Smoking status: Never Smoker   . Smokeless tobacco: Never Used  . Alcohol Use: No  . Drug Use: No  . Sexual Activity: Not on file   Other Topics Concern  . Not on file   Social History Narrative   ** Merged History Encounter **       Daughter in [place]   Son in [place]   Son in [place]   Retired- [employer], worked at [employer] for 28 years   Enjoys dancing and going to [place]   Completed 11th grade          Past Surgical History  Procedure Laterality Date  . Total knee arthroplasty      x 2  . Hemorrhoid surgery    . Cataracts    . Hand surgery      Pt states she had both hands  operated on for arthritis.  . Shoulder surgery Left     arthroscopy  . Total knee arthroplasty Right   . Total knee arthroplasty Left   . Hand surgery    . Cataract extraction, bilateral    . Tonsillectomy    . Shoulder arthroscopy Left   . Minor hemorrhoidectomy      Family History  Problem Relation Age of Onset  . Arthritis Mother   . Cancer Sister     breast  . Diabetes Sister   . Diabetes Brother   . Diabetes Brother   . Arthritis-Osteo Mother   . Breast cancer Sister   . Suicidality Brother     Allergies  Allergen Reactions  . Aspirin Other (See Comments)    Reaction: G I upset  . Aspirin Other (See Comments)    Hurts her bladder  . Codeine Nausea And Vomiting  . Codeine Nausea And Vomiting  . Morphine And Related Nausea And Vomiting  . Morphine And Related Nausea And Vomiting  . Penicillins Swelling  . Penicillins Hives and Swelling    Current Outpatient  Prescriptions on File Prior to Visit  Medication Sig Dispense Refill  . acetaminophen (TYLENOL) 500 MG tablet Take 1,000 mg by mouth every 6 (six) hours as needed. For pain    . ALPRAZolam (XANAX) 0.25 MG tablet Take 1 tablet (0.25 mg total) by mouth at bedtime as needed for anxiety. 30 tablet 0  . Ranitidine HCl (ACID REDUCER PO) Take 1 tablet by mouth daily as needed.     No current facility-administered medications on file prior to visit.    BP 122/64 mmHg  Pulse 62  Temp(Src) 97.5 F (36.4 C) (Oral)  Resp 18  Ht 5\' 4"  (1.626 m)  Wt 140 lb (63.504 kg)  BMI 24.02 kg/m2  SpO2 96%    Objective:   Physical Exam  Constitutional: She is oriented to person, place, and time. She appears well-developed and well-nourished.  HENT:  Head: Normocephalic and atraumatic.  Cardiovascular: Normal rate, regular rhythm and normal heart sounds.   No murmur heard. Pulmonary/Chest: Effort normal and breath sounds normal. No respiratory distress. She has no wheezes.  Abdominal: Soft. She exhibits no distension.  There is no tenderness. There is no rebound and no guarding.  Musculoskeletal: She exhibits no edema.  Neurological: She is alert and oriented to person, place, and time.  Psychiatric: She has a normal mood and affect. Her behavior is normal. Judgment and thought content normal.          Assessment & Plan:

## 2015-01-03 ENCOUNTER — Encounter: Payer: Self-pay | Admitting: Family

## 2015-01-05 LAB — H. PYLORI BREATH TEST: H. PYLORI BREATH TEST: DETECTED — AB

## 2015-01-06 DIAGNOSIS — R1013 Epigastric pain: Secondary | ICD-10-CM | POA: Insufficient documentation

## 2015-01-06 NOTE — Assessment & Plan Note (Signed)
Uncontrolled. Trial of low dose trazadone. We did discuss that meds that help with sleep may cause increased somnolence and increase risk of falls. She verbalizes understanding and wishes to proceed with medication.

## 2015-01-06 NOTE — Assessment & Plan Note (Signed)
H pylori is positive. Will initiate triple therapy. Refer to GI for further evaluation, obtain abdominal US.

## 2015-01-07 ENCOUNTER — Telehealth: Payer: Self-pay | Admitting: Family

## 2015-01-07 MED ORDER — METRONIDAZOLE 500 MG PO TABS: 500.0000 mg | ORAL_TABLET | Freq: Two times a day (BID) | ORAL | Status: DC

## 2015-01-07 MED ORDER — CLARITHROMYCIN 500 MG PO TABS: 500.0000 mg | ORAL_TABLET | Freq: Two times a day (BID) | ORAL | Status: DC

## 2015-01-07 MED ORDER — PANTOPRAZOLE SODIUM 40 MG PO TBEC: 40.0000 mg | DELAYED_RELEASE_TABLET | Freq: Two times a day (BID) | ORAL | Status: DC

## 2015-01-07 NOTE — Telephone Encounter (Signed)
Please contact pt and let her know that her lab work shows + H pylori which can cause stomach irritation and ulcers.  I would recommend that she start stop prilosec.  Instead start the following medications: protonix, metronidazole, clarithromycin x 2 weeks and plan to meet with GI for consult as we discussed at her visit.

## 2015-01-09 NOTE — Telephone Encounter (Signed)
Attempted to reach pt at cell# and received message that memory is full. Called home # and left message for pt to return my call.

## 2015-01-13 NOTE — Telephone Encounter (Signed)
Noted  

## 2015-01-13 NOTE — Telephone Encounter (Signed)
Notified pt. She states she has appt with GI, Dr Ardis Hughs tomorrow and she is not going to start any medication until she speaks with him. I tried to explain to pt that she still needs to proceed with treatment but she states she will talk with specialist first.

## 2015-01-14 ENCOUNTER — Telehealth: Payer: Self-pay | Admitting: Gastroenterology

## 2015-01-14 ENCOUNTER — Ambulatory Visit (INDEPENDENT_AMBULATORY_CARE_PROVIDER_SITE_OTHER): Payer: Medicare Other | Admitting: Gastroenterology

## 2015-01-14 ENCOUNTER — Encounter: Payer: Self-pay | Admitting: Gastroenterology

## 2015-01-14 VITALS — BP 98/50 | HR 60 | Ht 64.0 in | Wt 138.1 lb

## 2015-01-14 DIAGNOSIS — R112 Nausea with vomiting, unspecified: Secondary | ICD-10-CM

## 2015-01-14 MED ORDER — BIS SUBCIT-METRONID-TETRACYC 140-125-125 MG PO CAPS: 3.0000 | ORAL_CAPSULE | Freq: Three times a day (TID) | ORAL | Status: DC

## 2015-01-14 NOTE — Telephone Encounter (Signed)
OK, please call the pharmacy and see if it will be cheaper if we break the pyrlera into its component parts and prescribe that way instead.

## 2015-01-14 NOTE — Telephone Encounter (Signed)
Pt prescribed pylera and states she cannot afford this. Please advise.

## 2015-01-14 NOTE — Progress Notes (Signed)
HPI: This is a   very pleasant 81 year old woman    who was referred to me by Debbrah Alar, NP  to evaluate  nausea, vomiting, epigastric pain .    Chief complaint is nausea, vomiting, epigastric pain   Nausea about daily.  Vomits rarely.  Also epigsatric pains that radiate to her back.  For about a year.  Has been in hosp with vomiting.  The pain occurs at same time as the nausea.  Lost some weight but now gaining.    She did not start The antibody that were recommended by her primary care provider for H pylori positive breath test   Labs 12/2014: cbc, cmet, lipase all essentially normal; H pylori breath test +  Review of systems: Pertinent positive and negative review of systems were noted in the above HPI section. Complete review of systems was performed and was otherwise normal.   Past Medical History  Diagnosis Date  . Pneumonia 04/2012  . Squamous cell carcinoma 07/30/2014    Invasive squamous cell carcinoma nasal dorsum- followed by derm Camillo Flaming MD  . Rheumatoid Arthritis   . Macular degeneration   . SCCA (squamous cell carcinoma) of skin     of Nose followed by Dr. Camillo Flaming    Past Surgical History  Procedure Laterality Date  . Total knee arthroplasty      x 2  . Hemorrhoid surgery    . Cataracts    . Hand surgery      Pt states she had both hands operated on for arthritis.  . Shoulder surgery Left     arthroscopy  . Total knee arthroplasty Right   . Total knee arthroplasty Left   . Hand surgery    . Cataract extraction, bilateral    . Tonsillectomy    . Shoulder arthroscopy Left   . Minor hemorrhoidectomy      Current Outpatient Prescriptions  Medication Sig Dispense Refill  . acetaminophen (TYLENOL) 500 MG tablet Take 1,000 mg by mouth every 6 (six) hours as needed. For pain    . traZODone (DESYREL) 50 MG tablet Take 0.5 tablets (25 mg total) by mouth at bedtime as needed for sleep. 30 tablet 0   No current  facility-administered medications for this visit.    Allergies as of 01/14/2015 - Review Complete 01/14/2015  Allergen Reaction Noted  . Aspirin Other (See Comments) 05/03/2012  . Aspirin Other (See Comments) 11/15/2014  . Codeine Nausea And Vomiting 05/03/2012  . Codeine Nausea And Vomiting 11/15/2014  . Morphine and related Nausea And Vomiting 05/03/2012  . Morphine and related Nausea And Vomiting 11/15/2014  . Penicillins Swelling 05/03/2012  . Penicillins Hives and Swelling 11/15/2014    Family History  Problem Relation Age of Onset  . Arthritis Mother   . Cancer Sister     breast  . Diabetes Sister   . Diabetes Brother   . Diabetes Brother   . Arthritis-Osteo Mother   . Breast cancer Sister   . Suicidality Brother     Social History   Social History  . Marital Status: Widowed    Spouse Name: N/A  . Number of Children: N/A  . Years of Education: N/A   Occupational History  . Not on file.   Social History Main Topics  . Smoking status: Never Smoker   . Smokeless tobacco: Never Used  . Alcohol Use: No  . Drug Use: No  . Sexual Activity: Not on file   Other Topics Concern  .  Not on file   Social History Narrative   ** Merged History Encounter **       Daughter in [place]   Son in [place]   Son in [place]   Retired- [employer], worked at [employer] [place] for 28 years   Enjoys dancing and [place]   Completed 11th grade           Physical Exam: BP 98/50 mmHg  Pulse 60  Ht 5\' 4"  (1.626 m)  Wt 138 lb 2 oz (62.653 kg)  BMI 23.70 kg/m2 Constitutional: generally well-appearing Psychiatric: alert and oriented x3 Eyes: extraocular movements intact Mouth: oral pharynx moist, no lesions Neck: supple no lymphadenopathy Cardiovascular: heart regular rate and rhythm Lungs: clear to auscultation bilaterally Abdomen: soft, nontender, nondistended, no obvious ascites, no peritoneal signs, normal bowel  sounds Extremities: no lower extremity edema bilaterally Skin: no lesions on visible extremities   Assessment and plan: 81 y.o. female with  intermittent nausea, vomiting, epigastric discomfort  Recent labs were reassuring, she is gaining weight lately so that is also reassuring. H. pylori breath test was positive and I suggested to her that we treat this test result with quadruple therapy regimen for 14 days as is the new guidelines for H. pylori treatment. She will return to see me in 2 months and if treating the H. pylori result is helpful at relieving her symptoms then no further testing is necessary. If she is still having symptoms then I would like to proceed with EGD or possible imaging of her GI tract.   Owens Loffler, MD [facility] 01/14/2015, 10:26 AM  Cc: Debbrah Alar, NP

## 2015-01-14 NOTE — Patient Instructions (Addendum)
Will prescribe pylera course 14 days. You also need to be on OTC prilosec (omeprazole) 20mg  1 pill twice a day during the pylera course. Please return to see Dr. Ardis Hughs in 2 months on 03-18-15 at 11:00 am

## 2015-01-14 NOTE — Telephone Encounter (Signed)
It would be cheaper to break this up. Would you like her to have amoxicillin 500mg  QID, biaxin 500 BID, and omeprazole 20mg  BID for 14 days? Please advise.

## 2015-01-15 NOTE — Telephone Encounter (Signed)
Dr Ardis Hughs the patient is allergic to pcn.  What else would you like me to send?

## 2015-01-15 NOTE — Telephone Encounter (Signed)
Can you ask her pharmacy about the component parts of pylera? Would like her to be on that those for 14 days, omeprazole 20mg  twice daily for 14 days as well.  Thanks

## 2015-01-15 NOTE — Telephone Encounter (Signed)
Yes, that is OK.  thanks

## 2015-01-15 NOTE — Telephone Encounter (Signed)
I spoke with the pharmacy and the pylera was broken down into tetracycline, metronidazole and omeprazole for 14 days.  They will contact the pt and make her aware the tetracycline will need to be ordered and will be available tomorrow.

## 2015-01-20 ENCOUNTER — Telehealth: Payer: Self-pay | Admitting: Gastroenterology

## 2015-01-21 ENCOUNTER — Encounter (HOSPITAL_COMMUNITY): Payer: Self-pay | Admitting: *Deleted

## 2015-01-21 ENCOUNTER — Other Ambulatory Visit: Payer: Self-pay

## 2015-01-21 DIAGNOSIS — R112 Nausea with vomiting, unspecified: Secondary | ICD-10-CM

## 2015-01-21 NOTE — Telephone Encounter (Signed)
Pts niece states that the pt is still having abdominal discomfort, nausea, and vomiting. Pt stopped the h pylori treatment, states it was making her very sick. Niece wonders if she has vertigo. Discussed with her that the pts PCP would treat vertigo. Per OV note it mentions possible EGD or other imaging. Please advise next step for pt.

## 2015-01-21 NOTE — Telephone Encounter (Signed)
Left message to call back  

## 2015-01-21 NOTE — Telephone Encounter (Signed)
Pt scheduled for EGD at [facility] 01/22/15@11 :30am, pt to be there at 10am and to be NPO after midnight. Pts niece aware of appt.

## 2015-01-21 NOTE — Telephone Encounter (Signed)
EGD is the next step here.  She will need to discuss vertigo symptoms with her PCP.  I can do EGD tomorrow at [facility] 11:30 spot if there is still availability. Otherwise next Thursday at [facility] with MAC.  thanks

## 2015-01-22 ENCOUNTER — Ambulatory Visit (HOSPITAL_COMMUNITY)
Admission: RE | Admit: 2015-01-22 | Discharge: 2015-01-22 | Disposition: A | Discharge status: Home / Self Care | Payer: Medicare Other | Source: Ambulatory Visit | Attending: Gastroenterology | Admitting: Gastroenterology

## 2015-01-22 ENCOUNTER — Ambulatory Visit (HOSPITAL_COMMUNITY): Payer: Medicare Other | Admitting: Anesthesiology

## 2015-01-22 ENCOUNTER — Encounter (HOSPITAL_COMMUNITY): Payer: Self-pay

## 2015-01-22 ENCOUNTER — Encounter (HOSPITAL_COMMUNITY)
Admission: RE | Disposition: A | Discharge status: Home / Self Care | Payer: Self-pay | Source: Ambulatory Visit | Attending: Gastroenterology

## 2015-01-22 DIAGNOSIS — Z8673 Personal history of transient ischemic attack (TIA), and cerebral infarction without residual deficits: Secondary | ICD-10-CM | POA: Diagnosis not present

## 2015-01-22 DIAGNOSIS — H3530 Unspecified macular degeneration: Secondary | ICD-10-CM | POA: Insufficient documentation

## 2015-01-22 DIAGNOSIS — K2940 Chronic atrophic gastritis without bleeding: Secondary | ICD-10-CM | POA: Insufficient documentation

## 2015-01-22 DIAGNOSIS — K219 Gastro-esophageal reflux disease without esophagitis: Secondary | ICD-10-CM | POA: Diagnosis not present

## 2015-01-22 DIAGNOSIS — R1013 Epigastric pain: Secondary | ICD-10-CM | POA: Diagnosis not present

## 2015-01-22 DIAGNOSIS — Z79899 Other long term (current) drug therapy: Secondary | ICD-10-CM | POA: Insufficient documentation

## 2015-01-22 DIAGNOSIS — Z96653 Presence of artificial knee joint, bilateral: Secondary | ICD-10-CM | POA: Insufficient documentation

## 2015-01-22 DIAGNOSIS — K2970 Gastritis, unspecified, without bleeding: Secondary | ICD-10-CM

## 2015-01-22 DIAGNOSIS — M069 Rheumatoid arthritis, unspecified: Secondary | ICD-10-CM | POA: Insufficient documentation

## 2015-01-22 DIAGNOSIS — M1990 Unspecified osteoarthritis, unspecified site: Secondary | ICD-10-CM | POA: Diagnosis not present

## 2015-01-22 DIAGNOSIS — K2990 Gastroduodenitis, unspecified, without bleeding: Secondary | ICD-10-CM | POA: Diagnosis not present

## 2015-01-22 DIAGNOSIS — I454 Nonspecific intraventricular block: Secondary | ICD-10-CM | POA: Diagnosis not present

## 2015-01-22 DIAGNOSIS — R112 Nausea with vomiting, unspecified: Secondary | ICD-10-CM | POA: Diagnosis not present

## 2015-01-22 DIAGNOSIS — K2950 Unspecified chronic gastritis without bleeding: Secondary | ICD-10-CM | POA: Diagnosis not present

## 2015-01-22 DIAGNOSIS — Z85828 Personal history of other malignant neoplasm of skin: Secondary | ICD-10-CM | POA: Diagnosis not present

## 2015-01-22 DIAGNOSIS — K449 Diaphragmatic hernia without obstruction or gangrene: Secondary | ICD-10-CM | POA: Diagnosis not present

## 2015-01-22 HISTORY — PX: ESOPHAGOGASTRODUODENOSCOPY: SHX5428

## 2015-01-22 SURGERY — EGD (ESOPHAGOGASTRODUODENOSCOPY)
Anesthesia: Monitor Anesthesia Care

## 2015-01-22 MED ORDER — SODIUM CHLORIDE 0.9 % IV SOLN: INTRAVENOUS | Status: DC

## 2015-01-22 MED ORDER — PROPOFOL 10 MG/ML IV BOLUS
INTRAVENOUS | Status: DC | PRN
  Administered 2015-01-22 (×2): 20 mg via INTRAVENOUS
  Administered 2015-01-22: 40 mg via INTRAVENOUS

## 2015-01-22 MED ORDER — PROPOFOL 500 MG/50ML IV EMUL
INTRAVENOUS | Status: DC | PRN
  Administered 2015-01-22: 50 ug/kg/min via INTRAVENOUS

## 2015-01-22 MED ORDER — PROPOFOL 10 MG/ML IV BOLUS
INTRAVENOUS | Status: AC
Start: 2015-01-22 — End: 2015-01-22
  Filled 2015-01-22: qty 20

## 2015-01-22 MED ORDER — LACTATED RINGERS IV SOLN
INTRAVENOUS | Status: DC
  Administered 2015-01-22: 11:00:00 via INTRAVENOUS

## 2015-01-22 MED ORDER — PROPOFOL 10 MG/ML IV BOLUS
INTRAVENOUS | Status: AC
  Filled 2015-01-22: qty 20

## 2015-01-22 NOTE — H&P (View-Only) (Signed)
HPI: This is a   very pleasant 81 year old woman    who was referred to me by Debbrah Alar, NP  to evaluate  nausea, vomiting, epigastric pain .    Chief complaint is nausea, vomiting, epigastric pain   Nausea about daily.  Vomits rarely.  Also epigsatric pains that radiate to her back.  For about a year.  Has been in hosp with vomiting.  The pain occurs at same time as the nausea.  Lost some weight but now gaining.    She did not start The antibody that were recommended by her primary care provider for H pylori positive breath test   Labs 12/2014: cbc, cmet, lipase all essentially normal; H pylori breath test +  Review of systems: Pertinent positive and negative review of systems were noted in the above HPI section. Complete review of systems was performed and was otherwise normal.   Past Medical History  Diagnosis Date  . Pneumonia 04/2012  . Squamous cell carcinoma 07/30/2014    Invasive squamous cell carcinoma nasal dorsum- followed by derm Camillo Flaming MD  . Rheumatoid Arthritis   . Macular degeneration   . SCCA (squamous cell carcinoma) of skin     of Nose followed by Dr. Camillo Flaming    Past Surgical History  Procedure Laterality Date  . Total knee arthroplasty      x 2  . Hemorrhoid surgery    . Cataracts    . Hand surgery      Pt states she had both hands operated on for arthritis.  . Shoulder surgery Left     arthroscopy  . Total knee arthroplasty Right   . Total knee arthroplasty Left   . Hand surgery    . Cataract extraction, bilateral    . Tonsillectomy    . Shoulder arthroscopy Left   . Minor hemorrhoidectomy      Current Outpatient Prescriptions  Medication Sig Dispense Refill  . acetaminophen (TYLENOL) 500 MG tablet Take 1,000 mg by mouth every 6 (six) hours as needed. For pain    . traZODone (DESYREL) 50 MG tablet Take 0.5 tablets (25 mg total) by mouth at bedtime as needed for sleep. 30 tablet 0   No current  facility-administered medications for this visit.    Allergies as of 01/14/2015 - Review Complete 01/14/2015  Allergen Reaction Noted  . Aspirin Other (See Comments) 05/03/2012  . Aspirin Other (See Comments) 11/15/2014  . Codeine Nausea And Vomiting 05/03/2012  . Codeine Nausea And Vomiting 11/15/2014  . Morphine and related Nausea And Vomiting 05/03/2012  . Morphine and related Nausea And Vomiting 11/15/2014  . Penicillins Swelling 05/03/2012  . Penicillins Hives and Swelling 11/15/2014    Family History  Problem Relation Age of Onset  . Arthritis Mother   . Cancer Sister     breast  . Diabetes Sister   . Diabetes Brother   . Diabetes Brother   . Arthritis-Osteo Mother   . Breast cancer Sister   . Suicidality Brother     Social History   Social History  . Marital Status: Widowed    Spouse Name: N/A  . Number of Children: N/A  . Years of Education: N/A   Occupational History  . Not on file.   Social History Main Topics  . Smoking status: Never Smoker   . Smokeless tobacco: Never Used  . Alcohol Use: No  . Drug Use: No  . Sexual Activity: Not on file   Other Topics Concern  .  Not on file   Social History Narrative   ** Merged History Encounter **       Daughter in [place]   Son in [place]   Son in [place]   Retired- [employer], worked at [place] for 28 years   Enjoys dancing and going to [place], [place]   Completed 11th grade           Physical Exam: BP 98/50 mmHg  Pulse 60  Ht 5\' 4"  (1.626 m)  Wt 138 lb 2 oz (62.653 kg)  BMI 23.70 kg/m2 Constitutional: generally well-appearing Psychiatric: alert and oriented x3 Eyes: extraocular movements intact Mouth: oral pharynx moist, no lesions Neck: supple no lymphadenopathy Cardiovascular: heart regular rate and rhythm Lungs: clear to auscultation bilaterally Abdomen: soft, nontender, nondistended, no obvious ascites, no peritoneal signs, normal bowel  sounds Extremities: no lower extremity edema bilaterally Skin: no lesions on visible extremities   Assessment and plan: 81 y.o. female with  intermittent nausea, vomiting, epigastric discomfort  Recent labs were reassuring, she is gaining weight lately so that is also reassuring. H. pylori breath test was positive and I suggested to her that we treat this test result with quadruple therapy regimen for 14 days as is the new guidelines for H. pylori treatment. She will return to see me in 2 months and if treating the H. pylori result is helpful at relieving her symptoms then no further testing is necessary. If she is still having symptoms then I would like to proceed with EGD or possible imaging of her GI tract.   Owens Loffler, MD [facility] 01/14/2015, 10:26 AM  Cc: Debbrah Alar, NP

## 2015-01-22 NOTE — Anesthesia Preprocedure Evaluation (Addendum)
Anesthesia Evaluation  Patient identified by MRN, date of birth, ID band Patient awake    Reviewed: Allergy & Precautions, NPO status , Patient's Chart, lab work & pertinent test results  Airway Mallampati: II  TM Distance: >3 FB Neck ROM: Full    Dental  (+) Dental Advisory Given, Partial Upper, Partial Lower   Pulmonary Current Smoker,    Pulmonary exam normal breath sounds clear to auscultation       Cardiovascular negative cardio ROS Normal cardiovascular exam+ dysrhythmias (Right BBB/left post fasc block)  Rhythm:Regular Rate:Normal     Neuro/Psych PSYCHIATRIC DISORDERS Depression CVA negative neurological ROS     GI/Hepatic Neg liver ROS, GERD  ,  Endo/Other  negative endocrine ROS  Renal/GU negative Renal ROS     Musculoskeletal  (+) Arthritis , Rheumatoid disorders,    Abdominal   Peds  Hematology negative hematology ROS (+)   Anesthesia Other Findings Day of surgery medications reviewed with the patient.  Reproductive/Obstetrics                            Anesthesia Physical Anesthesia Plan  ASA: III  Anesthesia Plan: MAC   Post-op Pain Management:    Induction: Intravenous  Airway Management Planned: Nasal Cannula  Additional Equipment:   Intra-op Plan:   Post-operative Plan:   Informed Consent: I have reviewed the patients History and Physical, chart, labs and discussed the procedure including the risks, benefits and alternatives for the proposed anesthesia with the patient or authorized representative who has indicated his/her understanding and acceptance.   Dental advisory given  Plan Discussed with: CRNA and Anesthesiologist  Anesthesia Plan Comments: (Discussed risks/benefits/alternatives to MAC sedation including need for ventilatory support, hypotension, need for conversion to general anesthesia.  All patient questions answered.  Patient wished to proceed.)         Anesthesia Quick Evaluation

## 2015-01-22 NOTE — Discharge Instructions (Signed)

## 2015-01-22 NOTE — Anesthesia Postprocedure Evaluation (Signed)
  Anesthesia Post-op Note  Patient: Joann Herrera  Procedure(s) Performed: Procedure(s) (LRB): ESOPHAGOGASTRODUODENOSCOPY (EGD) (N/A)  Patient Location: PACU  Anesthesia Type: MAC  Level of Consciousness: awake and alert   Airway and Oxygen Therapy: Patient Spontanous Breathing  Post-op Pain: mild  Post-op Assessment: Post-op Vital signs reviewed, Patient's Cardiovascular Status Stable, Respiratory Function Stable, Patent Airway and No signs of Nausea or vomiting  Last Vitals:  Filed Vitals:   01/22/15 1155  BP: 114/50  Pulse: 62  Temp:   Resp: 23    Post-op Vital Signs: stable   Complications: No apparent anesthesia complications

## 2015-01-22 NOTE — Interval H&P Note (Signed)
History and Physical Interval Note:  01/22/2015 11:09 AM  Joann Herrera  has presented today for surgery, with the diagnosis of Nausea/vomiting  The various methods of treatment have been discussed with the patient and family. After consideration of risks, benefits and other options for treatment, the patient has consented to  Procedure(s): ESOPHAGOGASTRODUODENOSCOPY (EGD) (N/A) as a surgical intervention .  The patient's history has been reviewed, patient examined, no change in status, stable for surgery.  I have reviewed the patient's chart and labs.  Questions were answered to the patient's satisfaction.     Milus Banister

## 2015-01-22 NOTE — Op Note (Signed)
[facility] [place], [id number]   ENDOSCOPY PROCEDURE REPORT  PATIENT: Joann Herrera, Joann Herrera  MR#: [id number] BIRTHDATE: Nov 27, 1923 , 49  yrs. old GENDER: female ENDOSCOPIST: Milus Banister, MD PROCEDURE DATE:  01/22/2015 PROCEDURE:  EGD w/ biopsy ASA CLASS:     Class III INDICATIONS:  intermittent nausea, vomiting.Marland Kitchen MEDICATIONS: Monitored anesthesia care TOPICAL ANESTHETIC: none  DESCRIPTION OF PROCEDURE: After the risks benefits and alternatives of the procedure were thoroughly explained, informed consent was obtained.  The Pentax Gastroscope [id number] endoscope was introduced through the mouth and advanced to the second portion of the duodenum , Without limitations.  The instrument was slowly withdrawn as the mucosa was fully examined.    There was mild, pan gastric atrophic changes (atrophic gastritis). This was biopsied and sent to pathology.  There was a 2cm hiatal hernia.  The examination was otherwise normal.  Retroflexed views revealed no abnormalities.     The scope was then withdrawn from the patient and the procedure completed.  COMPLICATIONS: There were no immediate complications.  ENDOSCOPIC IMPRESSION: There was mild, pan gastric atrophic changes (atrophic gastritis). This was biopsied and sent to pathology.  There was a 2cm hiatal hernia.  The examination was otherwise normal  RECOMMENDATIONS: If biopsies are positive for H.  pylori, will recommend that you restart you antibiotics and will prophylax with scheduled anti nausea medicines.  eSigned:  Milus Banister, MD 01/22/2015 11:32 AM    CC: Jeri LagerConley Canal, [place]

## 2015-01-22 NOTE — Transfer of Care (Signed)
Immediate Anesthesia Transfer of Care Note  Patient: Joann Herrera  Procedure(s) Performed: Procedure(s): ESOPHAGOGASTRODUODENOSCOPY (EGD) (N/A)  Patient Location: PACU  Anesthesia Type:MAC  Level of Consciousness:  sedated, patient cooperative and responds to stimulation  Airway & Oxygen Therapy:Patient Spontanous Breathing and Patient connected to face mask oxgen  Post-op Assessment:  Report given to PACU RN and Post -op Vital signs reviewed and stable  Post vital signs:  Reviewed and stable  Last Vitals:  Filed Vitals:   01/22/15 1048  BP: 128/53  Pulse: 59  Temp: 36.6 C  Resp: 20    Complications: No apparent anesthesia complications

## 2015-01-26 ENCOUNTER — Other Ambulatory Visit: Payer: Self-pay | Admitting: *Deleted

## 2015-01-26 ENCOUNTER — Telehealth: Payer: Self-pay | Admitting: Family

## 2015-01-26 MED ORDER — ONDANSETRON HCL 4 MG PO TABS: 4.0000 mg | ORAL_TABLET | Freq: Two times a day (BID) | ORAL | Status: DC

## 2015-01-28 ENCOUNTER — Ambulatory Visit: Payer: Medicare Other | Admitting: Family

## 2015-01-28 DIAGNOSIS — Z0289 Encounter for other administrative examinations: Secondary | ICD-10-CM

## 2015-02-02 ENCOUNTER — Telehealth: Payer: Self-pay | Admitting: Family

## 2015-02-09 NOTE — Telephone Encounter (Signed)
Pt was no show 01/28/15 11:00am, acute appt, pt did not reschedule, charge or no charge?

## 2015-02-09 NOTE — Telephone Encounter (Signed)
Yes please

## 2015-02-13 ENCOUNTER — Ambulatory Visit: Payer: Medicare Other | Admitting: Family

## 2015-02-13 ENCOUNTER — Telehealth: Payer: Self-pay | Admitting: Family

## 2015-02-13 NOTE — Telephone Encounter (Signed)
No charge. 

## 2015-02-13 NOTE — Telephone Encounter (Signed)
Pt was no show today 1:15pm for follow up appt, VM 10/21 11:23am from her niece that she couldn't make it but no reason given, charge or no charge?

## 2015-02-17 DIAGNOSIS — C44321 Squamous cell carcinoma of skin of nose: Secondary | ICD-10-CM | POA: Diagnosis not present

## 2015-02-17 DIAGNOSIS — L570 Actinic keratosis: Secondary | ICD-10-CM | POA: Diagnosis not present

## 2015-02-17 DIAGNOSIS — Z23 Encounter for immunization: Secondary | ICD-10-CM | POA: Diagnosis not present

## 2015-02-20 ENCOUNTER — Ambulatory Visit: Payer: Medicare Other | Admitting: Internal Medicine

## 2015-02-25 ENCOUNTER — Ambulatory Visit (INDEPENDENT_AMBULATORY_CARE_PROVIDER_SITE_OTHER): Payer: Medicare Other | Admitting: Internal Medicine

## 2015-02-25 ENCOUNTER — Encounter: Payer: Self-pay | Admitting: Internal Medicine

## 2015-02-25 VITALS — BP 108/64 | HR 73 | Temp 98.1°F | Resp 16 | Ht 63.0 in | Wt 136.0 lb

## 2015-02-25 DIAGNOSIS — R200 Anesthesia of skin: Secondary | ICD-10-CM

## 2015-02-25 DIAGNOSIS — I700 Atherosclerosis of aorta: Secondary | ICD-10-CM

## 2015-02-25 DIAGNOSIS — R42 Dizziness and giddiness: Secondary | ICD-10-CM | POA: Diagnosis not present

## 2015-02-25 DIAGNOSIS — K219 Gastro-esophageal reflux disease without esophagitis: Secondary | ICD-10-CM

## 2015-02-25 DIAGNOSIS — S2249XS Multiple fractures of ribs, unspecified side, sequela: Secondary | ICD-10-CM

## 2015-02-25 DIAGNOSIS — D62 Acute posthemorrhagic anemia: Secondary | ICD-10-CM

## 2015-02-25 DIAGNOSIS — K2970 Gastritis, unspecified, without bleeding: Secondary | ICD-10-CM

## 2015-02-25 DIAGNOSIS — M4852XS Collapsed vertebra, not elsewhere classified, cervical region, sequela of fracture: Secondary | ICD-10-CM

## 2015-02-25 DIAGNOSIS — D649 Anemia, unspecified: Secondary | ICD-10-CM

## 2015-02-25 DIAGNOSIS — S129XXS Fracture of neck, unspecified, sequela: Secondary | ICD-10-CM

## 2015-02-25 DIAGNOSIS — F17200 Nicotine dependence, unspecified, uncomplicated: Secondary | ICD-10-CM | POA: Insufficient documentation

## 2015-02-25 DIAGNOSIS — R2681 Unsteadiness on feet: Secondary | ICD-10-CM

## 2015-02-25 DIAGNOSIS — R1013 Epigastric pain: Secondary | ICD-10-CM | POA: Diagnosis not present

## 2015-02-25 DIAGNOSIS — R51 Headache: Secondary | ICD-10-CM | POA: Diagnosis not present

## 2015-02-25 DIAGNOSIS — N289 Disorder of kidney and ureter, unspecified: Secondary | ICD-10-CM

## 2015-02-25 DIAGNOSIS — R519 Headache, unspecified: Secondary | ICD-10-CM

## 2015-02-25 DIAGNOSIS — F17210 Nicotine dependence, cigarettes, uncomplicated: Secondary | ICD-10-CM

## 2015-02-25 NOTE — Assessment & Plan Note (Addendum)
Does not want to take any prescription medication Not interested  in quitting smoking

## 2015-02-25 NOTE — Assessment & Plan Note (Signed)
Will rule out gallbladder disease. Ultrasound of the abdomen ordered in the past, but she did not have. We've ordered today May also be related to gastritis and GERD Will take omeprazole twice daily

## 2015-02-25 NOTE — Assessment & Plan Note (Signed)
PT did not help Likely multifactorial Will refer to neuro

## 2015-02-25 NOTE — Progress Notes (Signed)
Subjective:    Patient ID: Joann Herrera, female    DOB: 02-Sep-1923, 81 y.o.   MRN: [id number]  HPI She is here to establish with a new pcp. She has multiple concerns.   Dizziness and cant walk straight:  For over a year she has been having intermittent dizziness.  It is associated with head movements and she gets very nauseous and vomits.  She has had imaging and it was normal.  She takes dramamine as needed.  She saw ENT and he told her it was not related to her ears.  She denies headaches with the dizziness, except if she vomits.  She denies hearing loss.   She was in the hospital in July for severe dizziness, nausea/vomiting.      Headaches:  She gets headaches a lot - every other day approximately.  Her eye doctor thinks it is related to strain because she has macular degeneration.  The pain is often in the frontal region or occipital regions of her head.  She denies a history of migraines.  She denies nausea/vomiting from the headaches.  The headaches started when she was diagnosed with macular degeneration.  She takes tylenol and it helps   Pain shooting through her ears:  She has had this for a couple of months.  It occurs randomly.  It goes from her ear up to the top of her head.  It occurs on the both sides - sometimes one side of the head and sometimes both sides of her head.  It is a sharp pain and it lasts 2-3 minutes.  She thinks it occurs a lot when straining to watch tv, but it occurs other times.  She has a history of cysts behind her ears and they were removed years ago - she wonders if this is related to them.  Stomach pain:  She has had these symptoms for a little over a year ago.  She has stomach pain every time she eats.  It does not matter what she eats.  It can last all day.  She has GERD symptoms depending on what she eats.  It can occur 2-3 days a week.  She takes an otc acid medication that works, but she does not take it daily.  She had a recent EGD and has mild  gastritis.  H pylori is negative.    She complains of no energy and doesn't want to do anything.       Medications and allergies reviewed with patient and updated if appropriate.  Patient Active Problem List   Diagnosis Date Noted  . Abdominal pain, epigastric 01/06/2015  . Hypertriglyceridemia 12/11/2014  . Vertigo 11/16/2014  . CVA (cerebral infarction) 11/16/2014  . Arthritis 11/16/2014  . Pre-syncope 11/16/2014  . Hypocalcemia 11/16/2014  . Squamous cell carcinoma ([place]) 07/30/2014  . Insomnia 03/17/2014  . Other malaise and fatigue 09/13/2013  . Depression 09/13/2013  . GERD (gastroesophageal reflux disease) 09/02/2013  . Unsteady gait 09/02/2013  . Dry eye 06/27/2013  . 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 Horseshoe tear of retina without detachment 03/31/2011  . Age-related macular degeneration, dry 03/31/2011    Past Medical History  Diagnosis Date  . Pneumonia 04/2012  . Squamous cell carcinoma (HCC) 07/30/2014    Invasive squamous cell carcinoma nasal dorsum- followed by derm Camillo Flaming MD  . Rheumatoid Arthritis   . Macular degeneration   . SCCA (squamous cell carcinoma) of skin     of Nose followed  by Dr. Camillo Flaming    Past Surgical History  Procedure Laterality Date  . Total knee arthroplasty      x 2  . Hemorrhoid surgery    . Cataracts    . Hand surgery      Pt states she had both hands operated on for arthritis.  . Shoulder surgery Left     arthroscopy  . Total knee arthroplasty Right   . Total knee arthroplasty Left   . Hand surgery    . Cataract extraction, bilateral    . Tonsillectomy    . Shoulder arthroscopy Left   . Minor hemorrhoidectomy    . Esophagogastroduodenoscopy N/A 01/22/2015    Procedure: ESOPHAGOGASTRODUODENOSCOPY (EGD);  Surgeon: Milus Banister, MD;  Location: [facility];  Service: Endoscopy;  Laterality: N/A;    Social History   Social History  . Marital Status: Widowed    Spouse Name: N/A  .  Number of Children: N/A  . Years of Education: N/A   Social History Main Topics  . Smoking status: Current Every Day Smoker -- 1.00 packs/day  . Smokeless tobacco: Never Used  . Alcohol Use: No  . Drug Use: No  . Sexual Activity: Not Asked   Other Topics Concern  . None   Social History Narrative   ** Merged History Encounter **       Daughter in [place]   Son in [place]   Son in [place]   Retired- [employer], worked at [employer] for 28 years   Enjoys dancing and going to [place]   Completed 11th grade          Review of Systems  Constitutional: Positive for appetite change (appetite is not good) and fatigue. Negative for fever and unexpected weight change.  HENT: Negative for hearing loss.   Respiratory: Positive for cough (occasional, sometimes phlegm) and shortness of breath. Negative for wheezing.   Cardiovascular: Negative for chest pain, palpitations and leg swelling.  Gastrointestinal: Positive for nausea (with vertigo only), vomiting (with vertigo) and abdominal pain (epigastric). Negative for blood in stool (no black stool).       Constipation/diarrhea - IBS  Genitourinary: Negative for dysuria and hematuria.  Musculoskeletal: Positive for arthralgias.  Neurological: Positive for dizziness, numbness and headaches. Negative for weakness.  Psychiatric/Behavioral: Negative for dysphoric mood. The patient is not nervous/anxious.        Objective:   Filed Vitals:   02/25/15 1530  BP: 108/64  Pulse: 73  Temp: 98.1 F (36.7 C)  Resp: 16   Filed Weights   02/25/15 1530  Weight: 136 lb (61.689 kg)   Body mass index is 24.1 kg/(m^2).   Physical Exam  Constitutional:  Elderly frail women in no distress  HENT:  Head: Normocephalic and atraumatic.  Bandage on nose - skin lesion removed  Neck: Neck supple. No tracheal deviation present. No thyromegaly present.  No carotid bruit  Cardiovascular: Normal rate, regular  rhythm and normal heart sounds.   No murmur heard. Pulmonary/Chest: Effort normal and breath sounds normal. No respiratory distress. She has no wheezes.  Abdominal: Soft. Bowel sounds are normal. She exhibits no distension and no mass. There is no tenderness.  Musculoskeletal: She exhibits no edema.  Lymphadenopathy:    She has no cervical adenopathy.  Neurological: She is alert.  Unsteady ambulating  Psychiatric: She has a normal mood and affect. Her behavior is normal.  Assessment & Plan:   See Problem List.

## 2015-02-25 NOTE — Patient Instructions (Addendum)
Start taking a PPI daily for at least 6-8 weeks, such as omeprazole 20 mg twice daily 30-60 minutes before breakfast and dinner.  Once your stomach pain has improved take the medication once daily only.    A referral for neurology was ordered.  An abdominal ultrasound was ordered to evaluate your abdominal pain.     Test(s) ordered today. Your results will be released to [place] (or called to you) after review, usually within 72hours after test completion. If any changes need to be made, you will be notified at that same time.

## 2015-02-25 NOTE — Assessment & Plan Note (Signed)
Not controlled and recent EGD shows gastritis Stressed taking some medication daily basis-omeprazole 20 mg twice daily, 30-60 minutes prior to breakfast and dinner Once her symptoms are controlled advised decreasing to once daily

## 2015-02-25 NOTE — Progress Notes (Signed)
Pre visit review using our clinic review tool, if applicable. No additional management support is needed unless otherwise documented below in the visit note. 

## 2015-02-25 NOTE — Assessment & Plan Note (Signed)
We'll check vitamin B12 level, which may also be contributing to her fatigue She may have neuropathy, which may also be contributing to her unsteadiness Referred to neurology

## 2015-03-10 DIAGNOSIS — S0120XA Unspecified open wound of nose, initial encounter: Secondary | ICD-10-CM | POA: Diagnosis not present

## 2015-03-16 ENCOUNTER — Other Ambulatory Visit: Payer: Medicare Other

## 2015-03-17 ENCOUNTER — Ambulatory Visit
Admission: RE | Admit: 2015-03-17 | Discharge: 2015-03-17 | Disposition: A | Discharge status: Home / Self Care | Payer: Medicare Other | Source: Ambulatory Visit | Attending: Internal Medicine | Admitting: Internal Medicine

## 2015-03-17 ENCOUNTER — Telehealth: Payer: Self-pay | Admitting: Internal Medicine

## 2015-03-17 ENCOUNTER — Encounter: Payer: Self-pay | Admitting: Internal Medicine

## 2015-03-17 DIAGNOSIS — K838 Other specified diseases of biliary tract: Secondary | ICD-10-CM | POA: Insufficient documentation

## 2015-03-17 DIAGNOSIS — R1013 Epigastric pain: Secondary | ICD-10-CM

## 2015-03-17 DIAGNOSIS — K8020 Calculus of gallbladder without cholecystitis without obstruction: Secondary | ICD-10-CM | POA: Insufficient documentation

## 2015-03-17 NOTE — Telephone Encounter (Signed)
The [place] of her abd does show some gallbladder stones.  Her gallbladder duct is slightly dilated.  We need to get a CT scan to better evaluate this.  I have ordered it.

## 2015-03-18 ENCOUNTER — Ambulatory Visit: Payer: Self-pay | Admitting: Gastroenterology

## 2015-03-18 NOTE — Telephone Encounter (Signed)
Spoke with pts daughter. She stated that she lives our of town and that her cousin, Larwance Rote [phone], will be taking her mother to the appt for the CT when scheduled.

## 2015-03-23 ENCOUNTER — Other Ambulatory Visit (INDEPENDENT_AMBULATORY_CARE_PROVIDER_SITE_OTHER): Payer: Medicare Other

## 2015-03-23 DIAGNOSIS — E781 Pure hyperglyceridemia: Secondary | ICD-10-CM

## 2015-03-23 DIAGNOSIS — R519 Headache, unspecified: Secondary | ICD-10-CM

## 2015-03-23 DIAGNOSIS — G4700 Insomnia, unspecified: Secondary | ICD-10-CM | POA: Diagnosis not present

## 2015-03-23 DIAGNOSIS — D649 Anemia, unspecified: Secondary | ICD-10-CM

## 2015-03-23 DIAGNOSIS — R1013 Epigastric pain: Secondary | ICD-10-CM

## 2015-03-23 DIAGNOSIS — K2970 Gastritis, unspecified, without bleeding: Secondary | ICD-10-CM

## 2015-03-23 DIAGNOSIS — R51 Headache: Secondary | ICD-10-CM

## 2015-03-23 DIAGNOSIS — R42 Dizziness and giddiness: Secondary | ICD-10-CM | POA: Diagnosis not present

## 2015-03-23 DIAGNOSIS — Z79899 Other long term (current) drug therapy: Secondary | ICD-10-CM | POA: Diagnosis not present

## 2015-03-23 DIAGNOSIS — R200 Anesthesia of skin: Secondary | ICD-10-CM

## 2015-03-23 DIAGNOSIS — R2681 Unsteadiness on feet: Secondary | ICD-10-CM

## 2015-03-23 LAB — VITAMIN B12: VITAMIN B 12: 372 pg/mL (ref 211–911)

## 2015-03-23 LAB — COMPREHENSIVE METABOLIC PANEL
ALBUMIN: 3.7 g/dL (ref 3.5–5.2)
ALT: 8 U/L (ref 0–35)
AST: 11 U/L (ref 0–37)
Alkaline Phosphatase: 69 U/L (ref 39–117)
BUN: 19 mg/dL (ref 6–23)
CHLORIDE: 106 meq/L (ref 96–112)
CO2: 26 mEq/L (ref 19–32)
Calcium: 9.3 mg/dL (ref 8.4–10.5)
Creatinine, Ser: 0.97 mg/dL (ref 0.40–1.20)
GFR: 57.44 mL/min — AB (ref >60.00)
Glucose, Bld: 96 mg/dL (ref 70–99)
POTASSIUM: 4.4 meq/L (ref 3.5–5.1)
SODIUM: 139 meq/L (ref 135–145)
TOTAL PROTEIN: 6.9 g/dL (ref 6.0–8.3)
Total Bilirubin: 0.3 mg/dL (ref 0.2–1.2)

## 2015-03-23 LAB — FERRITIN: FERRITIN: 30.1 ng/mL (ref 10.0–291.0)

## 2015-03-23 LAB — CBC WITH DIFFERENTIAL/PLATELET
BASOS PCT: 0.5 % (ref 0.0–3.0)
Basophils Absolute: 0 10*3/uL (ref 0.0–0.1)
EOS PCT: 2.8 % (ref 0.0–5.0)
Eosinophils Absolute: 0.2 10*3/uL (ref 0.0–0.7)
HCT: 37.7 % (ref 36.0–46.0)
HEMOGLOBIN: 12.5 g/dL (ref 12.0–15.0)
LYMPHS ABS: 2.3 10*3/uL (ref 0.7–4.0)
Lymphocytes Relative: 41.6 % (ref 12.0–46.0)
MCHC: 33.2 g/dL (ref 30.0–36.0)
MCV: 86 fl (ref 78.0–100.0)
MONOS PCT: 7.7 % (ref 3.0–12.0)
Monocytes Absolute: 0.4 10*3/uL (ref 0.1–1.0)
Neutro Abs: 2.6 10*3/uL (ref 1.4–7.7)
Neutrophils Relative %: 47.4 % (ref 43.0–77.0)
Platelets: 174 10*3/uL (ref 150.0–400.0)
RBC: 4.38 Mil/uL (ref 3.87–5.11)
RDW: 14.8 % (ref 11.5–15.5)
WBC: 5.4 10*3/uL (ref 4.0–10.5)

## 2015-03-23 LAB — IRON: IRON: 61 ug/dL (ref 42–145)

## 2015-03-23 LAB — TSH: TSH: 1.81 u[IU]/mL (ref 0.35–4.50)

## 2015-03-25 ENCOUNTER — Inpatient Hospital Stay: Admission: RE | Admit: 2015-03-25 | Payer: Medicare Other | Source: Ambulatory Visit

## 2015-03-26 ENCOUNTER — Inpatient Hospital Stay: Admission: RE | Admit: 2015-03-26 | Payer: Medicare Other | Source: Ambulatory Visit

## 2015-04-07 ENCOUNTER — Ambulatory Visit: Payer: Medicare Other | Admitting: Neurology

## 2015-04-16 ENCOUNTER — Ambulatory Visit (INDEPENDENT_AMBULATORY_CARE_PROVIDER_SITE_OTHER)
Admission: RE | Admit: 2015-04-16 | Discharge: 2015-04-16 | Disposition: A | Discharge status: Home / Self Care | Payer: Medicare Other | Source: Ambulatory Visit | Attending: Internal Medicine | Admitting: Internal Medicine

## 2015-04-16 ENCOUNTER — Inpatient Hospital Stay: Admission: RE | Admit: 2015-04-16 | Payer: Medicare Other | Source: Ambulatory Visit

## 2015-04-16 DIAGNOSIS — K8020 Calculus of gallbladder without cholecystitis without obstruction: Secondary | ICD-10-CM | POA: Diagnosis not present

## 2015-04-16 DIAGNOSIS — K838 Other specified diseases of biliary tract: Secondary | ICD-10-CM

## 2015-04-16 MED ORDER — IOHEXOL 300 MG/ML  SOLN
80.0000 mL | Freq: Once | INTRAMUSCULAR | Status: AC | PRN
  Administered 2015-04-16: 80 mL via INTRAVENOUS

## 2015-04-21 ENCOUNTER — Encounter (HOSPITAL_COMMUNITY): Payer: Self-pay | Admitting: *Deleted

## 2015-04-21 ENCOUNTER — Other Ambulatory Visit (INDEPENDENT_AMBULATORY_CARE_PROVIDER_SITE_OTHER): Payer: Medicare Other

## 2015-04-21 ENCOUNTER — Encounter: Payer: Self-pay | Admitting: Physician Assistant

## 2015-04-21 ENCOUNTER — Ambulatory Visit (INDEPENDENT_AMBULATORY_CARE_PROVIDER_SITE_OTHER): Payer: Medicare Other | Admitting: Physician Assistant

## 2015-04-21 ENCOUNTER — Ambulatory Visit (INDEPENDENT_AMBULATORY_CARE_PROVIDER_SITE_OTHER): Payer: Medicare Other | Admitting: Internal Medicine

## 2015-04-21 ENCOUNTER — Encounter: Payer: Self-pay | Admitting: Internal Medicine

## 2015-04-21 ENCOUNTER — Telehealth: Payer: Self-pay | Admitting: Emergency Medicine

## 2015-04-21 VITALS — BP 144/70 | HR 66 | Temp 97.7°F | Resp 18 | Wt 136.0 lb

## 2015-04-21 VITALS — BP 110/68 | HR 76 | Ht 63.0 in | Wt 137.4 lb

## 2015-04-21 DIAGNOSIS — R9389 Abnormal findings on diagnostic imaging of other specified body structures: Secondary | ICD-10-CM

## 2015-04-21 DIAGNOSIS — R110 Nausea: Secondary | ICD-10-CM | POA: Diagnosis not present

## 2015-04-21 DIAGNOSIS — R938 Abnormal findings on diagnostic imaging of other specified body structures: Secondary | ICD-10-CM | POA: Diagnosis not present

## 2015-04-21 DIAGNOSIS — K838 Other specified diseases of biliary tract: Secondary | ICD-10-CM

## 2015-04-21 DIAGNOSIS — R1013 Epigastric pain: Secondary | ICD-10-CM | POA: Diagnosis not present

## 2015-04-21 DIAGNOSIS — I714 Abdominal aortic aneurysm, without rupture, unspecified: Secondary | ICD-10-CM

## 2015-04-21 DIAGNOSIS — R933 Abnormal findings on diagnostic imaging of other parts of digestive tract: Secondary | ICD-10-CM | POA: Diagnosis not present

## 2015-04-21 LAB — COMPREHENSIVE METABOLIC PANEL
ALT: 7 U/L (ref 0–35)
AST: 10 U/L (ref 0–37)
Albumin: 3.9 g/dL (ref 3.5–5.2)
Alkaline Phosphatase: 73 U/L (ref 39–117)
BUN: 19 mg/dL (ref 6–23)
CHLORIDE: 106 meq/L (ref 96–112)
CO2: 29 meq/L (ref 19–32)
Calcium: 9.5 mg/dL (ref 8.4–10.5)
Creatinine, Ser: 1.1 mg/dL (ref 0.40–1.20)
GFR: 49.67 mL/min — ABNORMAL LOW (ref >60.00)
GLUCOSE: 102 mg/dL — AB (ref 70–99)
POTASSIUM: 4.2 meq/L (ref 3.5–5.1)
SODIUM: 141 meq/L (ref 135–145)
TOTAL PROTEIN: 7 g/dL (ref 6.0–8.3)
Total Bilirubin: 0.3 mg/dL (ref 0.2–1.2)

## 2015-04-21 LAB — CBC WITH DIFFERENTIAL/PLATELET
Basophils Absolute: 0 10*3/uL (ref 0.0–0.1)
Basophils Relative: 0.5 % (ref 0.0–3.0)
EOS PCT: 2.4 % (ref 0.0–5.0)
Eosinophils Absolute: 0.2 10*3/uL (ref 0.0–0.7)
HCT: 37.3 % (ref 36.0–46.0)
Hemoglobin: 12.4 g/dL (ref 12.0–15.0)
LYMPHS ABS: 2.3 10*3/uL (ref 0.7–4.0)
Lymphocytes Relative: 31.7 % (ref 12.0–46.0)
MCHC: 33.2 g/dL (ref 30.0–36.0)
MCV: 86.6 fl (ref 78.0–100.0)
MONO ABS: 0.5 10*3/uL (ref 0.1–1.0)
Monocytes Relative: 7.1 % (ref 3.0–12.0)
NEUTROS ABS: 4.2 10*3/uL (ref 1.4–7.7)
NEUTROS PCT: 58.3 % (ref 43.0–77.0)
PLATELETS: 314 10*3/uL (ref 150.0–400.0)
RBC: 4.31 Mil/uL (ref 3.87–5.11)
RDW: 14.6 % (ref 11.5–15.5)
WBC: 7.3 10*3/uL (ref 4.0–10.5)

## 2015-04-21 MED ORDER — NA SULFATE-K SULFATE-MG SULF 17.5-3.13-1.6 GM/177ML PO SOLN: 1.0000 | ORAL | Status: DC

## 2015-04-21 MED ORDER — PROMETHAZINE HCL 25 MG PO TABS: 25.0000 mg | ORAL_TABLET | Freq: Three times a day (TID) | ORAL | Status: DC | PRN

## 2015-04-21 NOTE — Progress Notes (Signed)
Subjective:    Patient ID: Joann Herrera, female    DOB: 04/06/1924, 81 y.o.   MRN: [id number]  HPI She is here with her family today to review her recent Ct scan.  I asked them to come in today to review the scan in person.  For a year and a half she has been experiencing nausea and abdominal pain.  The pain has been worse after eating, but she has the pain even without eating.  She has GERD symptoms as well.  She an EGD and did have gastritis and has been on medication for the GERD.  She has not been eating much and more recently has been living on chocolate milk. She has no energy and does not want to do anything. She had an [place] that was abnormal and a Ct scan was done last week.    Medications and allergies reviewed with patient and updated if appropriate.  Patient Active Problem List   Diagnosis Date Noted  . Cholelithiasis 03/17/2015  . Dilated cbd, acquired 03/17/2015  . Gastritis 02/25/2015  . Aortic arch atherosclerosis ([place]) 02/25/2015  . Numbness 02/25/2015  . Nicotine dependence 02/25/2015  . Abdominal pain, epigastric 01/06/2015  . Hypertriglyceridemia 12/11/2014  . Vertigo 11/16/2014  . CVA (cerebral infarction) 11/16/2014  . Arthritis 11/16/2014  . Pre-syncope 11/16/2014  . Hypocalcemia 11/16/2014  . Squamous cell carcinoma ([place]) 07/30/2014  . Insomnia 03/17/2014  . Other malaise and fatigue 09/13/2013  . Depression 09/13/2013  . GERD (gastroesophageal reflux disease) 09/02/2013  . Unsteady gait 09/02/2013  . Dry eye 06/27/2013  . 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 Horseshoe tear of retina without detachment 03/31/2011  . Age-related macular degeneration, dry 03/31/2011    Current Outpatient Prescriptions on File Prior to Visit  Medication Sig Dispense Refill  . acetaminophen (TYLENOL) 500 MG tablet Take 1,000 mg by mouth every 6 (six) hours as needed. For pain    . DimenhyDRINATE (DRAMAMINE PO) Take by mouth as needed.    . Omeprazole (PRILOSEC  PO) Take by mouth.     No current facility-administered medications on file prior to visit.    Past Medical History  Diagnosis Date  . Pneumonia 04/2012  . Squamous cell carcinoma (HCC) 07/30/2014    Invasive squamous cell carcinoma nasal dorsum- followed by derm Camillo Flaming MD  . Rheumatoid Arthritis   . Macular degeneration   . SCCA (squamous cell carcinoma) of skin     of Nose followed by Dr. Camillo Flaming    Past Surgical History  Procedure Laterality Date  . Total knee arthroplasty      x 2  . Hemorrhoid surgery    . Cataracts    . Hand surgery      Pt states she had both hands operated on for arthritis.  . Shoulder surgery Left     arthroscopy  . Total knee arthroplasty Right   . Total knee arthroplasty Left   . Hand surgery    . Cataract extraction, bilateral    . Tonsillectomy    . Shoulder arthroscopy Left   . Minor hemorrhoidectomy    . Esophagogastroduodenoscopy N/A 01/22/2015    Procedure: ESOPHAGOGASTRODUODENOSCOPY (EGD);  Surgeon: Milus Banister, MD;  Location: [facility];  Service: Endoscopy;  Laterality: N/A;    Social History   Social History  . Marital Status: Widowed    Spouse Name: N/A  . Number of Children: N/A  . Years of Education: N/A   Social  History Main Topics  . Smoking status: Current Every Day Smoker -- 1.00 packs/day  . Smokeless tobacco: Never Used  . Alcohol Use: No  . Drug Use: No  . Sexual Activity: Not on file   Other Topics Concern  . Not on file   Social History Narrative   ** Merged History Encounter **       Daughter in [place]   Son in [place]   Son in [place]   Retired- [employer], worked at [employer] for 28 years   Enjoys dancing and going to [place]   Completed 11th grade          Review of Systems As above    Objective:   Filed Vitals:   04/21/15 1059  BP: 144/70  Pulse: 66  Temp: 97.7 F (36.5 C)  Resp: 18   Filed Weights   04/21/15  1059  Weight: 136 lb (61.689 kg)   Body mass index is 24.1 kg/(m^2).   Physical Exam  Constitutional:  She is in no acute distress  Skin: Skin is warm and dry.  Psychiatric: She has a normal mood and affect. Her behavior is normal.        Assessment & Plan:   I reviewed the Ct scan in detail with her and her family.  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  It extends to the pancreas, but appears to be separate from it.  The DDX includes lymphadenopathy, pancreatic or mesenteric soft tissue mass.  There is also a soft tissue thickening in the cecum.  She has cholelithiasis.  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    25 minutes were spent face-to-face with the patient and her family, over 100% of which was spent reviewing the Ct scan, counseling regarding possible diagnoses, and coordinating care.  She will be seeing GI later today to determine if they can do a procedure to help diagnose the mass in the porta hepatis and/or cecum area.  An anti-nausea medication was prescribed.  She will call medication does not work and we can try something different.

## 2015-04-21 NOTE — Progress Notes (Signed)
Patient ID: Mylisa P Delude, female   DOB: 10/21/1923, 81 y.o.   MRN: [id number]   Subjective:    Patient ID: Gladies P Abila, female    DOB: 11/11/1923, 81 y.o.   MRN: [id number]  HPI  Uldine is a pleasant 81-year-old white female recently known to Dr. Jacobs who is referred back today by Dr. Stacy Burns after an abnormal CT of the abdomen and pelvis. Patient had undergone an EGD in September 2016 for complaints of nausea and vomiting. She was found to have mild gastritis and a small hiatal hernia. She had also had treatment for a positive H. pylori antibody prior to the EGD. Patient says that she continued to have symptoms of epigastric discomfort queasiness nausea and occasional vomiting over the last couple of months should not necessarily been worse but have been persistent. Her appetite has been poor and she complains of generalized fatigue. Her weight is down about 5 pounds. She has noted alternating bowel habits melena or hematochezia. Her main complaint is epigastric pain which is exacerbated by eating and causes pain to radiate into her back. Most recent labs were done 03/23/2015, liver function studies were normal CBC unremarkable.  CT of the abdomen and pelvis was done on 04/16/2015 and showed suboptimal visualization of the upper abdomen due to intractable nausea she had a lobulated heterogeneous mass in the porta hepatitis measuring 3.4 cm abutting the body of the pancreas but appears to be separate from it this may represent lymphadenopathy versus pancreatic or mesenteric soft tissue mass. The extrahepatic common bile duct distended to 9 mm with smooth tapering at the level of the ampulla mild dilation of the proximal main pancreatic duct also seen .Also has soft tissue thickening within the cecum slightly inferior to the ileocecal valve . No prior colonoscopy has been done.  Review of Systems Pertinent positive and negative review of systems were noted in the above HPI section.  All other  review of systems was otherwise negative.  Outpatient Encounter Prescriptions as of 04/21/2015  Medication Sig  . acetaminophen (TYLENOL) 500 MG tablet Take 1,000 mg by mouth every 6 (six) hours as needed. For pain  . Multiple Vitamin (MULTIVITAMIN) tablet Take 1 tablet by mouth daily.  . promethazine (PHENERGAN) 25 MG tablet Take 1 tablet (25 mg total) by mouth every 8 (eight) hours as needed for nausea or vomiting.  . Na Sulfate-K Sulfate-Mg Sulf SOLN Take 1 kit by mouth as directed.  . [DISCONTINUED] DimenhyDRINATE (DRAMAMINE PO) Take by mouth as needed.  . [DISCONTINUED] Omeprazole (PRILOSEC PO) Take by mouth.   No facility-administered encounter medications on file as of 04/21/2015.   Allergies  Allergen Reactions  . Aspirin Other (See Comments)    Reaction: G I upset, hurts bladder  . Codeine Nausea And Vomiting  . Morphine And Related Nausea And Vomiting  . Penicillins Hives and Swelling   Patient Active Problem List   Diagnosis Date Noted  . Abdominal aortic aneurysm (HCC) 04/21/2015  . Cholelithiasis 03/17/2015  . Dilated cbd, acquired 03/17/2015  . Gastritis 02/25/2015  . Aortic arch atherosclerosis (HCC) 02/25/2015  . Numbness 02/25/2015  . Nicotine dependence 02/25/2015  . Abdominal pain, epigastric 01/06/2015  . Hypertriglyceridemia 12/11/2014  . Vertigo 11/16/2014  . CVA (cerebral infarction) 11/16/2014  . Arthritis 11/16/2014  . Pre-syncope 11/16/2014  . Hypocalcemia 11/16/2014  . Squamous cell carcinoma (HCC) 07/30/2014  . Insomnia 03/17/2014  . Other malaise and fatigue 09/13/2013  . Depression 09/13/2013  . GERD (gastroesophageal reflux   disease) 09/02/2013  . Unsteady gait 09/02/2013  . Dry eye 06/27/2013  . 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 Horseshoe tear of retina without detachment 03/31/2011  . Age-related macular degeneration, dry 03/31/2011   Social History   Social History  . Marital Status: Widowed    Spouse Name: N/A  . Number  of Children: N/A  . Years of Education: N/A   Occupational History  . Not on file.   Social History Main Topics  . Smoking status: Current Every Day Smoker -- 1.00 packs/day  . Smokeless tobacco: Never Used  . Alcohol Use: No  . Drug Use: No  . Sexual Activity: Not on file   Other Topics Concern  . Not on file   Social History Narrative   ** Merged History Encounter **       Daughter in [place]   Son in [place]   Son in [place]   Retired- laundry business, worked at [employer]   Widowed for 28 years   Enjo[place] dancing and [place]   Completed 11th grade          Ms. Glaude's family history includes Arthritis in her mother; Arthritis-Osteo in her mother; Breast cancer in her sister; Cancer in her sister; Diabetes in her brother, brother, and sister; Suicidality in her brother.      Objective:    Filed Vitals:   04/21/15 1327  BP: 110/68  Pulse: 76    Physical Exam  well-developed elderly white female in no acute distress, accompanied by 2 family members blood pressure 110/68 pulse 76 height 5 foot 3 weight 137. HEENT; nontraumatic normocephalic EOMI PERRLA sclera anicteric, Cardiovascular; regular rate and rhythm with S1-S2 no murmur or gallop, Pulmonary; clear bilaterally, Abdomen; soft she has mild tenderness in the epigastrium and also in the right lower quadrant there is no guarding or rebound no palpable mass or hepatosplenomegaly bowel sounds are present, Rectal; exam not done, Extremities; no clubbing cyanosis or edema skin warm and dry, Neuropsych; mood and affect appropriate       Assessment & Plan:   #1 81 yo female with 3-4 month hx of epigastric discomfort worse post prandially with radiation to back, nausea , fatigue.  Ct scan concerning for possible cecal mass , and soft tissue abnormality in the area of porta hepatis ? Lymphadenopathy. #2 Gallstones  #3 HX CVA  Plan;  Long discussion with pt and family Repeat labs  today, check CEA,CA19-9 Schedule for MRI/MRCP  Schedule for Colonoscopy with Dr Jacobs. Procedure discussed in detail  with pt and she is agreeable to proceed. Offered an analgesic but she prefers to use tylenol for now.      Benedicto Capozzi S Poseidon Pam PA-C 04/21/2015   Cc: Burns, Stacy J, MD   

## 2015-04-21 NOTE — Telephone Encounter (Signed)
Trying to get pt in for an appt today to discuss CT results. Pts daughter is trying to get in touch with son to get pt in office.

## 2015-04-21 NOTE — Progress Notes (Signed)
Pre visit review using our clinic review tool, if applicable. No additional management support is needed unless otherwise documented below in the visit note. 

## 2015-04-21 NOTE — Patient Instructions (Addendum)
You have been scheduled for an MRI at 04/30/15 on 5 pm. Your appointment time is at [facility]. Please arrive 15 minutes prior to your appointment time for registration purposes. Please make certain not to have anything to eat or drink 4 hours prior to your test. In addition, if you have any metal in your body, have a pacemaker or defibrillator, please be sure to let your ordering physician know. This test typically takes 45 minutes to 1 hour to complete.   You have been scheduled for a colonoscopy. Please follow written instructions given to you at your visit today.  Please pick up your prep supplies at the pharmacy within the next 1-3 days. If you use inhalers (even only as needed), please bring them with you on the day of your procedure. Your physician has requested that you go to www.startemmi.com and enter the access code given to you at your visit today. This web site gives a general overview about your procedure. However, you should still follow specific instructions given to you by our office regarding your preparation for the procedure.  Your physician has requested that you go to the basement for lab work before leaving today.  Please take Tylenol as needed for pain.

## 2015-04-21 NOTE — Patient Instructions (Addendum)
We reviewed the cat scan today.  I sent an anti-nausea medication to your pharmacy.  If this does not work please call and let me know and we can try something different.   Try drinking some boost or ensure.  You have an appointment upstairs with GI at 2:30.

## 2015-04-22 ENCOUNTER — Telehealth: Payer: Self-pay | Admitting: Emergency Medicine

## 2015-04-22 ENCOUNTER — Encounter: Payer: Self-pay | Admitting: *Deleted

## 2015-04-22 LAB — CEA: CEA: 4.1 ng/mL (ref 0.0–5.0)

## 2015-04-22 LAB — CANCER ANTIGEN 19-9: CA 19-9: 147 U/mL — ABNORMAL HIGH (ref <35.0)

## 2015-04-22 NOTE — Telephone Encounter (Signed)
Spoke to patients son [name] who was confused about the wrong date on the instuctions for his mothers colonscopy. Will not be able to bring her tomorrow 12/29 for procedure. He planned to do everything next Thursday and took the day off. Explained this to Dr. Ardis Hughs CMA and scheduled patient for 04/30/15 for colon at [facility]. Called Endo and rescheduled procedure to 04/30/15. Patients son informed and expressed understanding that she will do colon at [facility] (in the morning) and MRI (in the evening) at [facility] the same day. Instructions reinforced and clarified.

## 2015-04-22 NOTE — Progress Notes (Signed)
i agree with the above note, plan 

## 2015-04-24 ENCOUNTER — Telehealth: Payer: Self-pay | Admitting: Emergency Medicine

## 2015-04-24 NOTE — Telephone Encounter (Signed)
Spoke with [facility] scheduling and they were able to move the MRI over to [facility] but at 4 pm instead of 5 pm. Left message on patients sons cell with this info. Stressed nothing to eat after 1 pm for the MRI.

## 2015-04-29 ENCOUNTER — Encounter (HOSPITAL_COMMUNITY): Payer: Self-pay | Admitting: *Deleted

## 2015-04-29 NOTE — Anesthesia Preprocedure Evaluation (Signed)
Anesthesia Evaluation  Patient identified by MRN, date of birth, ID band Patient awake    Reviewed: Allergy & Precautions, NPO status , Patient's Chart, lab work & pertinent test results  Airway Mallampati: II  TM Distance: >3 FB Neck ROM: Full    Dental  (+) Dental Advisory Given, Partial Upper, Partial Lower   Pulmonary Current Smoker,    Pulmonary exam normal breath sounds clear to auscultation       Cardiovascular negative cardio ROS Normal cardiovascular exam+ dysrhythmias (Right BBB/left post fasc block)  Rhythm:Regular Rate:Normal     Neuro/Psych PSYCHIATRIC DISORDERS Depression CVA negative neurological ROS     GI/Hepatic Neg liver ROS, GERD  ,  Endo/Other  negative endocrine ROS  Renal/GU negative Renal ROS     Musculoskeletal  (+) Arthritis , Rheumatoid disorders,    Abdominal   Peds  Hematology negative hematology ROS (+)   Anesthesia Other Findings Day of surgery medications reviewed with the patient.  Reproductive/Obstetrics                             Anesthesia Physical  Anesthesia Plan  ASA: III  Anesthesia Plan: MAC   Post-op Pain Management:    Induction: Intravenous  Airway Management Planned: Nasal Cannula  Additional Equipment:   Intra-op Plan:   Post-operative Plan:   Informed Consent: I have reviewed the patients History and Physical, chart, labs and discussed the procedure including the risks, benefits and alternatives for the proposed anesthesia with the patient or authorized representative who has indicated his/her understanding and acceptance.   Dental advisory given  Plan Discussed with: CRNA and Anesthesiologist  Anesthesia Plan Comments: (Mac with propofol, previously has done well)        Anesthesia Quick Evaluation

## 2015-04-30 ENCOUNTER — Telehealth: Payer: Self-pay

## 2015-04-30 ENCOUNTER — Encounter (HOSPITAL_COMMUNITY)
Admission: RE | Disposition: A | Discharge status: Home / Self Care | Payer: Self-pay | Source: Ambulatory Visit | Attending: Gastroenterology

## 2015-04-30 ENCOUNTER — Ambulatory Visit (HOSPITAL_COMMUNITY): Payer: Medicare Other

## 2015-04-30 ENCOUNTER — Other Ambulatory Visit: Payer: Self-pay | Admitting: Physician Assistant

## 2015-04-30 ENCOUNTER — Ambulatory Visit (HOSPITAL_COMMUNITY): Payer: Medicare Other | Admitting: Anesthesiology

## 2015-04-30 ENCOUNTER — Encounter (HOSPITAL_COMMUNITY): Payer: Self-pay | Admitting: *Deleted

## 2015-04-30 ENCOUNTER — Ambulatory Visit (HOSPITAL_COMMUNITY)
Admission: RE | Admit: 2015-04-30 | Discharge: 2015-04-30 | Disposition: A | Discharge status: Home / Self Care | Payer: Medicare Other | Source: Ambulatory Visit | Attending: Gastroenterology | Admitting: Gastroenterology

## 2015-04-30 ENCOUNTER — Ambulatory Visit (HOSPITAL_COMMUNITY)
Admission: RE | Admit: 2015-04-30 | Discharge: 2015-04-30 | Disposition: A | Discharge status: Home / Self Care | Payer: Medicare Other | Source: Ambulatory Visit | Attending: Physician Assistant | Admitting: Physician Assistant

## 2015-04-30 DIAGNOSIS — K219 Gastro-esophageal reflux disease without esophagitis: Secondary | ICD-10-CM | POA: Diagnosis not present

## 2015-04-30 DIAGNOSIS — K6389 Other specified diseases of intestine: Secondary | ICD-10-CM | POA: Diagnosis not present

## 2015-04-30 DIAGNOSIS — I700 Atherosclerosis of aorta: Secondary | ICD-10-CM | POA: Insufficient documentation

## 2015-04-30 DIAGNOSIS — R1013 Epigastric pain: Secondary | ICD-10-CM | POA: Insufficient documentation

## 2015-04-30 DIAGNOSIS — Z8673 Personal history of transient ischemic attack (TIA), and cerebral infarction without residual deficits: Secondary | ICD-10-CM | POA: Insufficient documentation

## 2015-04-30 DIAGNOSIS — K8020 Calculus of gallbladder without cholecystitis without obstruction: Secondary | ICD-10-CM | POA: Diagnosis not present

## 2015-04-30 DIAGNOSIS — R938 Abnormal findings on diagnostic imaging of other specified body structures: Secondary | ICD-10-CM

## 2015-04-30 DIAGNOSIS — C189 Malignant neoplasm of colon, unspecified: Secondary | ICD-10-CM

## 2015-04-30 DIAGNOSIS — R110 Nausea: Secondary | ICD-10-CM | POA: Diagnosis not present

## 2015-04-30 DIAGNOSIS — M1990 Unspecified osteoarthritis, unspecified site: Secondary | ICD-10-CM | POA: Diagnosis not present

## 2015-04-30 DIAGNOSIS — F17200 Nicotine dependence, unspecified, uncomplicated: Secondary | ICD-10-CM | POA: Diagnosis not present

## 2015-04-30 DIAGNOSIS — C187 Malignant neoplasm of sigmoid colon: Secondary | ICD-10-CM | POA: Diagnosis not present

## 2015-04-30 DIAGNOSIS — R9389 Abnormal findings on diagnostic imaging of other specified body structures: Secondary | ICD-10-CM

## 2015-04-30 DIAGNOSIS — H3530 Unspecified macular degeneration: Secondary | ICD-10-CM | POA: Diagnosis not present

## 2015-04-30 DIAGNOSIS — D125 Benign neoplasm of sigmoid colon: Secondary | ICD-10-CM | POA: Insufficient documentation

## 2015-04-30 DIAGNOSIS — I714 Abdominal aortic aneurysm, without rupture: Secondary | ICD-10-CM | POA: Diagnosis not present

## 2015-04-30 DIAGNOSIS — D122 Benign neoplasm of ascending colon: Secondary | ICD-10-CM | POA: Insufficient documentation

## 2015-04-30 DIAGNOSIS — K5730 Diverticulosis of large intestine without perforation or abscess without bleeding: Secondary | ICD-10-CM | POA: Diagnosis not present

## 2015-04-30 DIAGNOSIS — R5383 Other fatigue: Secondary | ICD-10-CM | POA: Insufficient documentation

## 2015-04-30 DIAGNOSIS — K635 Polyp of colon: Secondary | ICD-10-CM | POA: Diagnosis not present

## 2015-04-30 DIAGNOSIS — R933 Abnormal findings on diagnostic imaging of other parts of digestive tract: Secondary | ICD-10-CM | POA: Diagnosis not present

## 2015-04-30 HISTORY — PX: COLONOSCOPY WITH PROPOFOL: SHX5780

## 2015-04-30 SURGERY — COLONOSCOPY WITH PROPOFOL
Anesthesia: Monitor Anesthesia Care

## 2015-04-30 MED ORDER — LACTATED RINGERS IV SOLN
INTRAVENOUS | Status: DC
  Administered 2015-04-30: 1000 mL via INTRAVENOUS

## 2015-04-30 MED ORDER — ONDANSETRON HCL 4 MG/2ML IJ SOLN
INTRAMUSCULAR | Status: DC | PRN
  Administered 2015-04-30: 4 mg via INTRAVENOUS

## 2015-04-30 MED ORDER — PROPOFOL 10 MG/ML IV BOLUS
INTRAVENOUS | Status: DC | PRN
  Administered 2015-04-30 (×6): 50 mg via INTRAVENOUS

## 2015-04-30 MED ORDER — ONDANSETRON HCL 4 MG/2ML IJ SOLN
INTRAMUSCULAR | Status: AC
  Filled 2015-04-30: qty 2

## 2015-04-30 MED ORDER — LIDOCAINE HCL (CARDIAC) 20 MG/ML IV SOLN
INTRAVENOUS | Status: AC
  Filled 2015-04-30: qty 5

## 2015-04-30 MED ORDER — PROPOFOL 10 MG/ML IV BOLUS
INTRAVENOUS | Status: AC
  Filled 2015-04-30: qty 20

## 2015-04-30 MED ORDER — LIDOCAINE HCL (PF) 2 % IJ SOLN
INTRAMUSCULAR | Status: DC | PRN
  Administered 2015-04-30: 20 mg via INTRADERMAL

## 2015-04-30 MED ORDER — SPOT INK MARKER SYRINGE KIT
PACK | SUBMUCOSAL | Status: DC | PRN
  Administered 2015-04-30: 8 mL via SUBMUCOSAL

## 2015-04-30 MED ORDER — GADOBENATE DIMEGLUMINE 529 MG/ML IV SOLN
15.0000 mL | Freq: Once | INTRAVENOUS | Status: AC | PRN
  Administered 2015-04-30: 12 mL via INTRAVENOUS

## 2015-04-30 MED ORDER — SODIUM CHLORIDE 0.9 % IV SOLN: INTRAVENOUS | Status: DC

## 2015-04-30 MED ORDER — SPOT INK MARKER SYRINGE KIT
PACK | SUBMUCOSAL | Status: AC
  Filled 2015-04-30: qty 5

## 2015-04-30 SURGICAL SUPPLY — 21 items

## 2015-04-30 NOTE — Anesthesia Postprocedure Evaluation (Signed)
Anesthesia Post Note  Patient: Joann Herrera  Procedure(s) Performed: Procedure(s) (LRB): COLONOSCOPY WITH PROPOFOL (N/A)  Patient location during evaluation: PACU Anesthesia Type: MAC Level of consciousness: awake and alert Pain management: pain level controlled Vital Signs Assessment: post-procedure vital signs reviewed and stable Respiratory status: spontaneous breathing, nonlabored ventilation, respiratory function stable and patient connected to nasal cannula oxygen Cardiovascular status: stable and blood pressure returned to baseline Anesthetic complications: no    Last Vitals:  Filed Vitals:   04/30/15 0950 04/30/15 1000  BP: 121/53 125/50  Pulse: 65 64  Temp:    Resp: 14 13    Last Pain: There were no vitals filed for this visit.               Zenaida Deed

## 2015-04-30 NOTE — Addendum Note (Signed)
Addendum  created 04/30/15 1108 by Jillyn Hidden, MD   Modules edited: Orders, PRL Based Order Sets

## 2015-04-30 NOTE — Interval H&P Note (Signed)
History and Physical Interval Note:  04/30/2015 8:23 AM  Joann Herrera  has presented today for surgery, with the diagnosis of epigastric pain,nasua,abnormal ct scan  The various methods of treatment have been discussed with the patient and family. After consideration of risks, benefits and other options for treatment, the patient has consented to  Procedure(s): COLONOSCOPY WITH PROPOFOL (N/A) as a surgical intervention .  The patient's history has been reviewed, patient examined, no change in status, stable for surgery.  I have reviewed the patient's chart and labs.  Questions were answered to the patient's satisfaction.     Milus Banister

## 2015-04-30 NOTE — H&P (View-Only) (Signed)
Patient ID: Joann Herrera, female   DOB: 12-Oct-1923, 82 y.o.   MRN: [id number]   Subjective:    Patient ID: Joann Herrera, female    DOB: [birth year], 82 y.o.   MRN: [id number]  HPI  Zynasia is a pleasant 82 year old white female recently known to Dr. Ardis Hughs who is referred back today by Dr. Billey Gosling after an abnormal CT of the abdomen and pelvis. Patient had undergone an EGD in September 2016 for complaints of nausea and vomiting. She was found to have mild gastritis and a small hiatal hernia. She had also had treatment for a positive H. pylori antibody prior to the EGD. Patient says that she continued to have symptoms of epigastric discomfort queasiness nausea and occasional vomiting over the last couple of months should not necessarily been worse but have been persistent. Her appetite has been poor and she complains of generalized fatigue. Her weight is down about 5 pounds. She has noted alternating bowel habits melena or hematochezia. Her main complaint is epigastric pain which is exacerbated by eating and causes pain to radiate into her back. Most recent labs were done 03/23/2015, liver function studies were normal CBC unremarkable.  CT of the abdomen and pelvis was done on 04/16/2015 and showed suboptimal visualization of the upper abdomen due to intractable nausea she had a lobulated heterogeneous mass in the porta hepatitis measuring 3.4 cm abutting the body of the pancreas but appears to be separate from it this may represent lymphadenopathy versus pancreatic or mesenteric soft tissue mass. The extrahepatic common bile duct distended to 9 mm with smooth tapering at the level of the ampulla mild dilation of the proximal main pancreatic duct also seen .Also has soft tissue thickening within the cecum slightly inferior to the ileocecal valve . No prior colonoscopy has been done.  Review of Systems Pertinent positive and negative review of systems were noted in the above HPI section.  All other  review of systems was otherwise negative.  Outpatient Encounter Prescriptions as of 04/21/2015  Medication Sig  . acetaminophen (TYLENOL) 500 MG tablet Take 1,000 mg by mouth every 6 (six) hours as needed. For pain  . Multiple Vitamin (MULTIVITAMIN) tablet Take 1 tablet by mouth daily.  . promethazine (PHENERGAN) 25 MG tablet Take 1 tablet (25 mg total) by mouth every 8 (eight) hours as needed for nausea or vomiting.  . Na Sulfate-K Sulfate-Mg Sulf SOLN Take 1 kit by mouth as directed.  . [DISCONTINUED] DimenhyDRINATE (DRAMAMINE PO) Take by mouth as needed.  . [DISCONTINUED] Omeprazole (PRILOSEC PO) Take by mouth.   No facility-administered encounter medications on file as of 04/21/2015.   Allergies  Allergen Reactions  . Aspirin Other (See Comments)    Reaction: G I upset, hurts bladder  . Codeine Nausea And Vomiting  . Morphine And Related Nausea And Vomiting  . Penicillins Hives and Swelling   Patient Active Problem List   Diagnosis Date Noted  . Abdominal aortic aneurysm ([place]) 04/21/2015  . Cholelithiasis 03/17/2015  . Dilated cbd, acquired 03/17/2015  . Gastritis 02/25/2015  . Aortic arch atherosclerosis ([employer]) 02/25/2015  . Numbness 02/25/2015  . Nicotine dependence 02/25/2015  . Abdominal pain, epigastric 01/06/2015  . Hypertriglyceridemia 12/11/2014  . Vertigo 11/16/2014  . CVA (cerebral infarction) 11/16/2014  . Arthritis 11/16/2014  . Pre-syncope 11/16/2014  . Hypocalcemia 11/16/2014  . Squamous cell carcinoma ([place]) 07/30/2014  . Insomnia 03/17/2014  . Other malaise and fatigue 09/13/2013  . Depression 09/13/2013  . GERD (gastroesophageal reflux  disease) 09/02/2013  . Unsteady gait 09/02/2013  . Dry eye 06/27/2013  . 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 Horseshoe tear of retina without detachment 03/31/2011  . Age-related macular degeneration, dry 03/31/2011   Social History   Social History  . Marital Status: Widowed    Spouse Name: N/A  . Number  of Children: N/A  . Years of Education: N/A   Occupational History  . Not on file.   Social History Main Topics  . Smoking status: Current Every Day Smoker -- 1.00 packs/day  . Smokeless tobacco: Never Used  . Alcohol Use: No  . Drug Use: No  . Sexual Activity: Not on file   Other Topics Concern  . Not on file   Social History Narrative   ** Merged History Encounter **       Daughter in [place]   Son in [place]   Son in [place]   Retired- [employer], worked at [employer] for 28 years   Enjoys dancing and going to [place]   Completed 11th grade          Ms. Odea's family history includes Arthritis in her mother; Arthritis-Osteo in her mother; Breast cancer in her sister; Cancer in her sister; Diabetes in her brother, brother, and sister; Suicidality in her brother.      Objective:    Filed Vitals:   04/21/15 1327  BP: 110/68  Pulse: 76    Physical Exam  well-developed elderly white female in no acute distress, accompanied by 2 family members blood pressure 110/68 pulse 76 height 5 foot 3 weight 137. HEENT; nontraumatic normocephalic EOMI PERRLA sclera anicteric, Cardiovascular; regular rate and rhythm with S1-S2 no murmur or gallop, Pulmonary; clear bilaterally, Abdomen; soft she has mild tenderness in the epigastrium and also in the right lower quadrant there is no guarding or rebound no palpable mass or hepatosplenomegaly bowel sounds are present, Rectal; exam not done, Extremities; no clubbing cyanosis or edema skin warm and dry, Neuropsych; mood and affect appropriate       Assessment & Plan:   #1 82 yo female with 3-4 month hx of epigastric discomfort worse post prandially with radiation to back, nausea , fatigue.  Ct scan concerning for possible cecal mass , and soft tissue abnormality in the area of porta hepatis ? Lymphadenopathy. #2 Gallstones  #3 HX CVA  Plan;  Long discussion with pt and family Repeat labs  today, check CEA,CA19-9 Schedule for MRI/MRCP  Schedule for Colonoscopy with Dr Ardis Hughs. Procedure discussed in detail  with pt and she is agreeable to proceed. Offered an analgesic but she prefers to use tylenol for now.      Mindel Friscia Genia Harold PA-C 04/21/2015   Cc: [name], MD

## 2015-04-30 NOTE — Op Note (Signed)
[facility] [place], [address]   COLONOSCOPY PROCEDURE REPORT  PATIENT: Joann Herrera, Joann Herrera  MR#: [id number] BIRTHDATE: Dec 11, 1923 , 72  yrs. old GENDER: female ENDOSCOPIST: Milus Banister, MD PROCEDURE DATE:  04/30/2015 PROCEDURE:   Colonoscopy, diagnostic, Colonoscopy with biopsy, Colonoscopy with snare polypectomy, and Submucosal injection, any substance First Screening Colonoscopy - Avg.  risk and is 50 yrs.  old or older - No.  Prior Negative Screening - Now for repeat screening. N/A  History of Adenoma - Now for follow-up colonoscopy & has been > or = to 3 yrs.  N/A  Polyps removed today? Yes ASA CLASS:   Class III INDICATIONS:abnormal colon on CT scan (cecal thickening); never had colonoscopy before; CT scan also showed periportal mass; CEA normal, CA 19-9 slightly elevated. MEDICATIONS: Monitored anesthesia care  DESCRIPTION OF PROCEDURE:   After the risks benefits and alternatives of the procedure were thoroughly explained, informed consent was obtained.  The digital rectal exam revealed no abnormalities of the rectum.   The Pentax Ped Colon [id number] endoscope was introduced through the anus and advanced to the cecum, which was identified by both the appendix and ileocecal valve. No adverse events experienced.   The quality of the prep was excellent.  The instrument was then slowly withdrawn as the colon was fully examined. Estimated blood loss is zero unless otherwise noted in this procedure report.  COLON FINDINGS: Three polyps were found, removed, two were sent to pathology.  These were all sessile, 5-15mm across, located in ascending, transverse and sigmoid segments, removed with cold snare (jar 1).  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  This was located in proximal sigmoid colon (35-40cm from anus), was biopsied extensively (jar 2) and then the site was labeled with  submucosal SPOT injection.  There was a large polypoid lesion in the distal sigmoid (15-17cm from anus) that was 3.5cm across, fairly soft but clearly too large for endoscopic resection.  This was extensively biopsied (jar 3) and also labeled with submcosal injection of SPOT. There were two to three small sessile satelite polyps located just distal to the distal sigmoid mass (not removed).  There were numerous diverticulum in the left colon.  The examination was otherwise normal.  Retroflexed views revealed no abnormalities. The time to cecum = 3 min Withdrawal time = 25 min   The scope was withdrawn and the procedure completed. COMPLICATIONS: There were no immediate complications.  ENDOSCOPIC IMPRESSION: 1.  Three polyps were found, removed, two were sent to pathology (jar 1). 2. 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  This was located in proximal sigmoid colon (35-40cm from anus), was biopsied extensively (jar 2) and then the site was labeled with submucosal SPOT injection. 3.  There was a large polypoid lesion in the distal sigmoid (15-17cm from anus) that was 3.5cm across, fairly soft but clearly too large for endoscopic resection.  This was extensively biopsied (jar 3) and also labeled with submcosal injection of SPOT. 4.  There were two to three small sessile satelite polyps located just distal to the distal sigmoid mass (not removed).  There were numerous diverticulum in the left colon.  The examination was otherwise normal  RECOMMENDATIONS: Await final pathology.  MRI/MRCP pending as well (to evaluate the periportal lesion).  My office will arrange referral to medical oncology and general surgery.  The periportal lesion may be a site of  distant metastasis or be a second issue.  May require further evaluation after MRI (PET scan vs.  EUS).  eSigned:  Milus Banister, MD 04/30/2015 9:56 AM   cc: Celso Amy,  MD   PATIENT NAME:  Joann Herrera, Joann Herrera MR#: [id number]

## 2015-04-30 NOTE — Transfer of Care (Signed)
Immediate Anesthesia Transfer of Care Note  Patient: Joann Herrera  Procedure(s) Performed: Procedure(s): COLONOSCOPY WITH PROPOFOL (N/A)  Patient Location: PACU  Anesthesia Type:MAC  Level of Consciousness:  sedated, patient cooperative and responds to stimulation  Airway & Oxygen Therapy:Patient Spontanous Breathing    Post-op Assessment:  Report given to PACU RN and Post -op Vital signs reviewed and stable  Post vital signs:  Reviewed and stable  Last Vitals:  Filed Vitals:   04/30/15 0757  BP: 128/47  Pulse: 62  Temp: 36.4 C  Resp: 25    Complications: No apparent anesthesia complications

## 2015-04-30 NOTE — Telephone Encounter (Signed)
-----   Message from Milus Banister, MD sent at 04/30/2015  9:57 AM EST ----- Chong Sicilian, She needs referral to medical oncology and general surgery for newly diagnosed colon cancer (possibly synchronous lesions see below)  Manuela Schwartz, Can you add her to next GI cancer conference?  Montel Culver  Thanks  ENDOSCOPIC IMPRESSION: 1.  Three polyps were found, removed, two were sent to pathology (jar 1).   2. 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  This was located in proximal sigmoid colon (35-40cm from anus), was biopsied extensively (jar 2) and then the site was labeled with submucosal SPOT injection.   3.  There was a large polypoid lesion in the distal sigmoid (15-17cm from anus) that was 3.5cm across, fairly soft but clearly too large for endoscopic resection.  This was extensively biopsied (jar 3) and also labeled with submcosal injection of SPOT.   4.  There were two to three small sessile satelite polyps located just distal to the distal sigmoid mass (not removed).  There were numerous diverticulum in the left colon.  The examination was otherwise normal   RECOMMENDATIONS: Await final pathology.  MRI/MRCP pending as well (to evaluate the periportal lesion).  My office will arrange referral to medical oncology and general surgery.  The periportal lesion may be a site of distant metastasis or be a second issue.  May require further evaluation after MRI (PET scan vs.  EUS).

## 2015-04-30 NOTE — Discharge Instructions (Signed)

## 2015-04-30 NOTE — Telephone Encounter (Signed)
Referral made to Med Onc and [facility]   You have been scheduled for an appointment with Dr Brantley Stage at [facility]. Your appointment is on 05/08/15 at 9:40 am. Please arrive at 9:10 am for registration. Make certain to bring a list of current medications, including any over the counter medications or vitamins. Also bring your co-pay if you have one as well as your insurance cards. [facility] is located at [address]. Should you need to reschedule your appointment, please contact them at [phone].  The pt has been notified of the appt with CCS and the referral with Med Onc.  The pt aware to call if she does not hear from that office in 1 week

## 2015-05-01 ENCOUNTER — Encounter (HOSPITAL_COMMUNITY): Payer: Self-pay | Admitting: Gastroenterology

## 2015-05-05 ENCOUNTER — Telehealth: Payer: Self-pay | Admitting: *Deleted

## 2015-05-05 NOTE — Telephone Encounter (Signed)
Oncology Nurse Navigator Documentation  Oncology Nurse Navigator Flowsheets 05/05/2015  Navigator Location [facility]-Med Onc  Navigator Encounter Type Introductory phone call  Abnormal Finding Date 04/16/2015  Confirmed Diagnosis Date 04/30/2015  [facility], she does not drive; requested I call her son Joann Herrera (lives in [place]). Called son and he was not able to take call. Requested nurse call back and leave appointment information on his call and he will call back tomorrow to confirm or request change.

## 2015-05-06 ENCOUNTER — Telehealth: Payer: Self-pay | Admitting: Gastroenterology

## 2015-05-06 ENCOUNTER — Telehealth: Payer: Self-pay | Admitting: *Deleted

## 2015-05-06 DIAGNOSIS — C189 Malignant neoplasm of colon, unspecified: Secondary | ICD-10-CM

## 2015-05-06 NOTE — Telephone Encounter (Signed)
[name], Can you call her son.  Discussed her case at todays GI cancer conference.  Group agrees she needs to see medical oncology and surgery. She also needs CT scan of chest with IV contrast (for staging of colon cancer).  Can you set that up?  Thanks

## 2015-05-06 NOTE — Telephone Encounter (Signed)
Oncology Nurse Navigator Documentation  Oncology Nurse Navigator Flowsheets 05/06/2015  Navigator Location [facility]-Med Onc  Navigator Encounter Type -  Abnormal Finding Date -  Confirmed Diagnosis Date -  Pandora Leiter, [name] called back: not able to bring her on 1/18 due to his employment. Rescheduled for 1/23 at 11:00 with [name].

## 2015-05-06 NOTE — Telephone Encounter (Signed)
You have been scheduled for a CT scan of the abdomen and pelvis at [facility] ([address]---this is in the same building as [facility]).   You are scheduled on 05/11/15 at 2:30. You should arrive 15 minutes prior to your appointment time for registration. Please follow the written instructions below on the day of your exam:  WARNING: IF YOU ARE ALLERGIC TO IODINE/X-RAY DYE, PLEASE NOTIFY RADIOLOGY IMMEDIATELY AT [phone]! YOU WILL BE GIVEN A 13 HOUR PREMEDICATION PREP.  1) Do not eat or drink anything after 12:30 pm (2 hours prior to your test)  You may take any medications as prescribed with a small amount of water except for the following: Metformin, Glucophage, Glucovance, Avandamet, Riomet, Fortamet, Actoplus Met, Janumet, Glumetza or Metaglip. The above medications must be held the day of the exam AND 48 hours after the exam.  The purpose of you drinking the oral contrast is to aid in the visualization of your intestinal tract. The contrast solution may cause some diarrhea. Before your exam is started, you will be given a small amount of fluid to drink. Depending on your individual set of symptoms, you may also receive an intravenous injection of x-ray contrast/dye. Plan on being at [facility] for 30 minutes or longer, depending on the type of exam you are having performed.  This test typically takes 30-45 minutes to complete.   The pt's son has been notified of the appt and was also advised of the med onc and ccs appt.  If you have any questions regarding your exam or if you need to reschedule, you may call the CT department at [phone] between the hours of 8:00 am and 5:00 pm, Monday-Friday.

## 2015-05-08 ENCOUNTER — Ambulatory Visit: Payer: Self-pay | Admitting: Surgery

## 2015-05-08 DIAGNOSIS — C187 Malignant neoplasm of sigmoid colon: Secondary | ICD-10-CM | POA: Diagnosis not present

## 2015-05-11 ENCOUNTER — Ambulatory Visit (INDEPENDENT_AMBULATORY_CARE_PROVIDER_SITE_OTHER)
Admission: RE | Admit: 2015-05-11 | Discharge: 2015-05-11 | Disposition: A | Discharge status: Home / Self Care | Payer: Medicare Other | Source: Ambulatory Visit | Attending: Gastroenterology | Admitting: Gastroenterology

## 2015-05-11 DIAGNOSIS — C189 Malignant neoplasm of colon, unspecified: Secondary | ICD-10-CM | POA: Diagnosis not present

## 2015-05-11 MED ORDER — IOHEXOL 300 MG/ML  SOLN
60.0000 mL | Freq: Once | INTRAMUSCULAR | Status: AC | PRN
  Administered 2015-05-11: 60 mL via INTRAVENOUS

## 2015-05-12 ENCOUNTER — Encounter: Payer: Self-pay | Admitting: Gastroenterology

## 2015-05-13 ENCOUNTER — Ambulatory Visit: Payer: Medicare Other | Admitting: Hematology

## 2015-05-18 ENCOUNTER — Encounter: Payer: Self-pay | Admitting: *Deleted

## 2015-05-18 ENCOUNTER — Ambulatory Visit (HOSPITAL_BASED_OUTPATIENT_CLINIC_OR_DEPARTMENT_OTHER): Payer: Medicare Other | Admitting: Hematology

## 2015-05-18 ENCOUNTER — Encounter: Payer: Self-pay | Admitting: Hematology

## 2015-05-18 VITALS — BP 139/57 | HR 64 | Resp 18 | Ht 63.0 in | Wt 136.2 lb

## 2015-05-18 DIAGNOSIS — R599 Enlarged lymph nodes, unspecified: Secondary | ICD-10-CM | POA: Diagnosis not present

## 2015-05-18 DIAGNOSIS — C187 Malignant neoplasm of sigmoid colon: Secondary | ICD-10-CM | POA: Diagnosis not present

## 2015-05-18 DIAGNOSIS — C186 Malignant neoplasm of descending colon: Secondary | ICD-10-CM | POA: Insufficient documentation

## 2015-05-18 MED ORDER — TRAMADOL HCL 50 MG PO TABS: 25.0000 mg | ORAL_TABLET | Freq: Four times a day (QID) | ORAL | Status: DC | PRN

## 2015-05-18 MED ORDER — ONDANSETRON HCL 8 MG PO TABS: 8.0000 mg | ORAL_TABLET | Freq: Three times a day (TID) | ORAL | Status: DC | PRN

## 2015-05-18 NOTE — Progress Notes (Signed)
Oncology Nurse Navigator Documentation  Oncology Nurse Navigator Flowsheets 05/18/2015  Navigator Location [facility]-Med Onc  Navigator Encounter Type Initial MedOnc  Abnormal Finding Date -  Confirmed Diagnosis Date -  Patient Visit Type MedOnc;Initial  Treatment Phase Other-discucssion of scan/biopsy results  Barriers/Navigation Needs Family concerns;Education;Coordination of Care  Education Coping with Diagnosis/ Prognosis;Pain/ Symptom Management;Newly Diagnosed Cancer Education;Transport During Treatment  Interventions Coordination of Care;Education Method  Coordination of Care EUS--message to Dr. Ardis Hughs requesting EUS this week for lymph node bx  Education Method Verbal;Written;Teach-back  Support Groups/Services GI Support Group;[place] and Road to Recovery  Acuity Level 2  Time Spent with Patient 60  Met with patient, son Elta Guadeloupe  and daughter, Ivin Booty during new patient visit. Explained the role of the GI Nurse Navigator and provided New Patient Packet with information on: 1. Colon cancer 2. Support groups 3. Advanced Directives 4. Fall Safety Plan Answered questions, reviewed current treatment plan using TEACH back and provided emotional support. Provided copy of current treatment plan. Suggested eating 6 small snacks daily to help her nausea and pain with meals. She will start MiraLax bid till she has good BM, then reduce to daily. Will try Zofran every 8 hours for nausea and Tramadol 1/2 tab for pain (will take 1st dose at bedtime tonight). Will make referral to dietician to see her at next MD visit if possible. Will also request CSW make contact with family for education on resources she may qualify for. Her son, Elta Guadeloupe lives 68 miles away and comes weekly to check on her and shop for her. Daughter lives in [place] and has one son in [place] that she does not see often. Also has a niece locally who would check on her if necessary. Has H/O falls in  home, but not recently. Wears a medical alert necklace. Has a pet dog that she lets out in fenced in yard as needed-she can no longer walk him due to her weakness and poor vision.  Merceda Elks, RN, BSN GI Oncology [facility]

## 2015-05-18 NOTE — Progress Notes (Signed)
[facility]  [phone] [phone]  Clinic New Consult Note   Patient Care Team: Pincus Sanes, MD as PCP - General (Internal Medicine) 05/18/2015  REFERRAL PHYSCIAN: Dr. Christella Hartigan   CHIEF COMPLAINTS/PURPOSE OF CONSULTATION:  Newly diagnosed colon cancer   HISTORY OF PRESENTING ILLNESS:  Joann Herrera 82 y.o. female is here because of recently diagnosed colon cancer. She is accompanied by her daughter and son to the clinic today.  She has had upper abdominal pain and intermittent nausea and vomiting for almost 2 years, she had multiple ED visit and hospitalization. She describes her pain is located in the epigastric area, usually happens after meals, 5/10 , radiates to back, with associated nausea and vomiting. She has moderate fatigue, she is no energy to do much activity except self cares. She has intermittent dizziness, especially when she stands up. Her appetite is low, she eats small meals. she had intermittent diarrhea for a few years with lossoe BM, but recently developed mild to moderate constipation, she lasot about 15 lbs in the past few years   She was referred to gastroenterologist Dr. Christella Hartigan, and underwent EGD on 01/22/2015, which was unremarkable. Abdominal ultrasound on 03/17/2015 showed gallbladder stone, and a 3.3 cm hypoechoic mass in the portal hepatitis. She subsequently underwent abdominal MRI on 04/30/2015, which showed a 2 cm short axis node in the porta hepatis and additional 1.4 cm port cable node, with central necrosis. She underwent a colonoscopy on 04/30/2015, which showed multiple polyps and a malignant appearing mass in the sigmoid colon, the biopsy confirmed adenocarcinoma. She was seen by surgeon Dr. Luisa Hart.  MEDICAL HISTORY:  Past Medical History  Diagnosis Date  . Pneumonia 04/2012  . Squamous cell carcinoma (HCC) 07/30/2014    Invasive squamous cell carcinoma nasal dorsum- followed by derm Betsy Coder MD  .  Rheumatoid Arthritis   . Macular degeneration   . SCCA (squamous cell carcinoma) of skin     of Nose followed by Dr. Betsy Coder    SURGICAL HISTORY: Past Surgical History  Procedure Laterality Date  . Total knee arthroplasty      x 2  . Hemorrhoid surgery    . Cataracts    . Hand surgery      Pt states she had both hands operated on for arthritis.  . Shoulder surgery Left     arthroscopy  . Total knee arthroplasty Right   . Total knee arthroplasty Left   . Hand surgery    . Cataract extraction, bilateral    . Tonsillectomy    . Shoulder arthroscopy Left   . Minor hemorrhoidectomy    . Esophagogastroduodenoscopy N/A 01/22/2015    Procedure: ESOPHAGOGASTRODUODENOSCOPY (EGD);  Surgeon: Rachael Fee, MD;  Location: [facility];  Service: Endoscopy;  Laterality: N/A;  . Joint replacement    . Eye surgery      bilateral cataracts  . Colonoscopy with propofol N/A 04/30/2015    Procedure: COLONOSCOPY WITH PROPOFOL;  Surgeon: Rachael Fee, MD;  Location: [facility];  Service: Endoscopy;  Laterality: N/A;    SOCIAL HISTORY: Social History   Social History  . Marital Status: Widowed    Spouse Name: N/A  . Number of Children: N/A  . Years of Education: N/A   Occupational History  . Not on file.   Social History Main Topics  . Smoking status: Current Every Day Smoker -- 1.00 packs/day  . Smokeless tobacco: Never Used  . Alcohol Use: No  .  Drug Use: No  . Sexual Activity: Not on file   Other Topics Concern  . Not on file   Social History Narrative   ** Merged History Encounter **       Daughter in [place]   Son in [place]   Son in [place]   Retired- [employer], worked at [employer]   Widowed for 28 years   Enjoys dancing and going to [place]   Completed 11th grade          FAMILY HISTORY: Family History  Problem Relation Age of Onset  . Arthritis Mother   . Cancer Sister     breast  . Diabetes Sister   .  Diabetes Brother   . Diabetes Brother   . Arthritis-Osteo Mother   . Breast cancer Sister   . Suicidality Brother     ALLERGIES:  is allergic to aspirin; codeine; morphine and related; and penicillins.  MEDICATIONS:  Current Outpatient Prescriptions  Medication Sig Dispense Refill  . acetaminophen (TYLENOL) 500 MG tablet Take 1,000 mg by mouth every 6 (six) hours as needed. For pain    . dimenhyDRINATE (DRAMAMINE) 50 MG tablet Take 25 mg by mouth as needed.    . Multiple Vitamin (MULTIVITAMIN) tablet Take 1 tablet by mouth daily.    . promethazine (PHENERGAN) 25 MG tablet Take 1 tablet (25 mg total) by mouth every 8 (eight) hours as needed for nausea or vomiting. 30 tablet 5   No current facility-administered medications for this visit.    REVIEW OF SYSTEMS:   Constitutional: Denies fevers, chills or abnormal night sweats Eyes: Denies blurriness of vision, double vision or watery eyes Ears, nose, mouth, throat, and face: Denies mucositis or sore throat Respiratory: Denies cough, dyspnea or wheezes Cardiovascular: Denies palpitation, chest discomfort or lower extremity swelling Gastrointestinal:  Denies nausea, heartburn or change in bowel habits Skin: Denies abnormal skin rashes Lymphatics: Denies new lymphadenopathy or easy bruising Neurological:Denies numbness, tingling or new weaknesses Behavioral/Psych: Mood is stable, no new changes  All other systems were reviewed with the patient and are negative.  PHYSICAL EXAMINATION: ECOG PERFORMANCE STATUS: 2 - Symptomatic, <50% confined to bed  Filed Vitals:   05/18/15 1100  BP: 139/57  Pulse: 64  Resp: 18   Filed Weights   05/18/15 1100  Weight: 136 lb 3.2 oz (61.78 kg)    GENERAL:alert, no distress and comfortable SKIN: skin color, texture, turgor are normal, no rashes or significant lesions EYES: normal, conjunctiva are pink and non-injected, sclera clear OROPHARYNX:no exudate, no erythema and lips, buccal mucosa, and  tongue normal  NECK: supple, thyroid normal size, non-tender, without nodularity LYMPH:  no palpable lymphadenopathy in the cervical, axillary or inguinal LUNGS: clear to auscultation and percussion with normal breathing effort HEART: regular rate & rhythm and no murmurs and no lower extremity edema ABDOMEN:abdomen soft, non-tender and normal bowel sounds Musculoskeletal:no cyanosis of digits and no clubbing  PSYCH: alert & oriented x 3 with fluent speech NEURO: no focal motor/sensory deficits  LABORATORY DATA:  I have reviewed the data as listed Lab Results  Component Value Date   WBC 7.3 04/21/2015   HGB 12.4 04/21/2015   HCT 37.3 04/21/2015   MCV 86.6 04/21/2015   PLT 314.0 04/21/2015    Recent Labs  11/15/14 2347  11/17/14 0403 01/02/15 1147 03/23/15 1222 04/21/15 1440  NA 141  --  143  --  139 141  K 4.1  --  4.4  --  4.4 4.2  CL 109  --  114*  --  106 106  CO2 24  --  25  --  26 29  GLUCOSE 115*  --  94  --  96 102*  BUN 15  --  13  --  19 19  CREATININE 0.86  --  1.06*  --  0.97 1.10  CALCIUM 8.6*  --  8.3*  --  9.3 9.5  GFRNONAA 59*  --  45*  --   --   --   GFRAA >60  --  53*  --   --   --   PROT  --   < > 5.2* 6.8 6.9 7.0  ALBUMIN  --   < > 2.7* 3.8 3.7 3.9  AST  --   < > 13* '13 11 10  '$ ALT  --   < > 9* '10 8 7  '$ ALKPHOS  --   < > 52 70 69 73  BILITOT  --   < > 0.4 0.3 0.3 0.3  BILIDIR  --   --   --  0.1  --   --   < > = values in this interval not displayed.  PATHOLOGY REPORT  Diagnosis 04/29/2014 1. Colon, biopsy, ascending - TUBULAR ADENOMA. NO HIGH GRADE DYSPLASIA OR MALIGNANCY IDENTIFIED. 2. Colon, biopsy, 35 cm sigmoid mass r/o malignancy - POORLY DIFFERENTIATED INVASIVE ADENOCARCINOMA. 3. Colon, polyp(s), sigmoid - TUBULAR ADENOMA. NO HIGH GRADE DYSPLASIA OR MALIGNANCY IDENTIFIED.   RADIOGRAPHIC STUDIES: I have personally reviewed the radiological images as listed and agreed with the findings in the report. Ct Chest W Contrast  05/11/2015   CLINICAL DATA:  Newly diagnosed colon cancer, evaluate for metastatic disease EXAM: CT CHEST WITH CONTRAST TECHNIQUE: Multidetector CT imaging of the chest was performed during intravenous contrast administration. CONTRAST:  25m OMNIPAQUE IOHEXOL 300 MG/ML  SOLN COMPARISON:  Partial comparison to MRI abdomen dated 04/30/2015 and CT abdomen pelvis dated 04/16/2015. FINDINGS: Mediastinum/Nodes: The heart is normal in size. No pericardial effusion. Coronary atherosclerosis in the LAD and right coronary artery. Atherosclerotic calcifications aortic arch. Small mediastinal lymph nodes, including an 8 mm short axis low right paratracheal node (series 2/image 20), within normal limits. Visualized thyroid is unremarkable. Lungs/Pleura: No suspicious pulmonary nodules. Moderate centrilobular and paraseptal emphysematous changes. No focal consolidation. No pleural effusion or pneumothorax. Upper abdomen: Visualized upper abdomen is notable for necrotic nodes in the porta hepatis measuring up to 1.8 cm short axis (series 2/ image 57), suspicious for nodal metastases given the clinical history. Layering gallstones. Musculoskeletal: Degenerative changes of the visualized thoracolumbar spine. Mild to moderate superior endplate compression fracture deformity at T7 (sagittal image 661, age indeterminate but likely chronic. IMPRESSION: No evidence of metastatic disease in the chest. Necrotic lymph nodes in the upper abdomen measuring up to 1.8 cm, suspicious for nodal metastases. Electronically Signed   By: SJulian HyM.D.   On: 05/11/2015 15:56   Mr 3d Recon At Scanner  04/30/2015  CLINICAL DATA:  Mass in porta hepatis on CT. Sigmoid colon lesion on colonoscopy today. EXAM: MRI ABDOMEN WITHOUT AND WITH CONTRAST (INCLUDING MRCP) TECHNIQUE: Multiplanar multisequence MR imaging of the abdomen was performed both before and after the administration of intravenous contrast. Heavily T2-weighted images of the biliary and  pancreatic ducts were obtained, and three-dimensional MRCP images were rendered by post processing. CONTRAST:  147mMULTIHANCE GADOBENATE DIMEGLUMINE 529 MG/ML IV SOLN COMPARISON:  CT abdomen pelvis dated 04/17/2015. FINDINGS: Lower chest: Probable mild atelectasis  in the bilateral lower lobes. Hepatobiliary: Liver is within normal limits. No suspicious/enhancing hepatic lesions. Numerous gallstones (series 3/ image 23), without associated inflammatory changes. No intrahepatic or extrahepatic ductal dilatation. Pancreas: Within normal limits. Spleen: Within normal limits. Adrenals/Urinary Tract: Adrenal glands are within normal limits. 9 mm right lower pole renal cyst (series 3/ image 33). 5 mm medial interpolar left renal cyst (series 3/ image 24). No hydronephrosis. Stomach/Bowel: Stomach is within normal limits. Visualized bowel is unremarkable. Vascular/Lymphatic: No evidence of abdominal aortic aneurysm. 2.0 x 3.6 cm node in the porta hepatis (series 3/ image 19), corresponding to the CT abnormality. Additional 1.4 cm short axis portacaval node (series 3/image 15). Other: No abdominal ascites. Musculoskeletal: No focal osseous lesions. IMPRESSION: 2.0 cm short axis node in the porta hepatis and additional 1.4 cm short axis portacaval node, nonspecific. In the setting of colonic malignancy, this appearance is worrisome for nodal metastases. Otherwise, no evidence of metastatic disease in the abdomen. Cholelithiasis, without associated inflammatory changes. Electronically Signed   By: Julian Hy M.D.   On: 04/30/2015 19:05   Mr Abd W/wo Cm/mrcp  04/30/2015  CLINICAL DATA:  Mass in porta hepatis on CT. Sigmoid colon lesion on colonoscopy today. EXAM: MRI ABDOMEN WITHOUT AND WITH CONTRAST (INCLUDING MRCP) TECHNIQUE: Multiplanar multisequence MR imaging of the abdomen was performed both before and after the administration of intravenous contrast. Heavily T2-weighted images of the biliary and pancreatic  ducts were obtained, and three-dimensional MRCP images were rendered by post processing. CONTRAST:  29m MULTIHANCE GADOBENATE DIMEGLUMINE 529 MG/ML IV SOLN COMPARISON:  CT abdomen pelvis dated 04/17/2015. FINDINGS: Lower chest: Probable mild atelectasis in the bilateral lower lobes. Hepatobiliary: Liver is within normal limits. No suspicious/enhancing hepatic lesions. Numerous gallstones (series 3/ image 23), without associated inflammatory changes. No intrahepatic or extrahepatic ductal dilatation. Pancreas: Within normal limits. Spleen: Within normal limits. Adrenals/Urinary Tract: Adrenal glands are within normal limits. 9 mm right lower pole renal cyst (series 3/ image 33). 5 mm medial interpolar left renal cyst (series 3/ image 24). No hydronephrosis. Stomach/Bowel: Stomach is within normal limits. Visualized bowel is unremarkable. Vascular/Lymphatic: No evidence of abdominal aortic aneurysm. 2.0 x 3.6 cm node in the porta hepatis (series 3/ image 19), corresponding to the CT abnormality. Additional 1.4 cm short axis portacaval node (series 3/image 15). Other: No abdominal ascites. Musculoskeletal: No focal osseous lesions. IMPRESSION: 2.0 cm short axis node in the porta hepatis and additional 1.4 cm short axis portacaval node, nonspecific. In the setting of colonic malignancy, this appearance is worrisome for nodal metastases. Otherwise, no evidence of metastatic disease in the abdomen. Cholelithiasis, without associated inflammatory changes. Electronically Signed   By: SJulian HyM.D.   On: 04/30/2015 19:05   Colonoscopy 04/30/2015 Dr. JArdis Hughs 1. Three polyps were found, removed, two were sent to pathology (jar 1). 2. 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 This was located in proximal sigmoid colon (35-40cm from anus), was biopsied extensively (jar 2) and then the site was labeled with submucosal SPOT injection. 3. There was a large polypoid  lesion in the distal sigmoid (15-17cm from anus) that was 3.5cm across, fairly soft but clearly too large for endoscopic resection. This was extensively biopsied (jar 3) and also labeled with submcosal injection of SPOT. 4. There were two to three small sessile satelite polyps located just distal to the distal sigmoid mass (not removed). There were numerous diverticulum in the left colon. The examination was otherwise  normal   EGD ENDOSCOPIC IMPRESSION: 01/22/2015 There was mild, pan gastric atrophic changes (atrophic gastritis). This was biopsied and sent to pathology. There was a 2cm hiatal hernia. The examination was otherwise normal  RECOMMENDATIONS: If biopsies are positive for H. pylori, will recommend that you restart you antibiotics and will prophylax with scheduled antinausea medicines.  RECOMMENDATIONS: Await final pathology. MRI/MRCP pending as well (to evaluate the periportal lesion). My office will arrange referral to medical oncology and general surgery. The periportal lesion may be a site of distant metastasis or be a second issue. May require further evaluation after MRI (PET scan vs. EUS).    ASSESSMENT & PLAN:  82 year old female, with past medical history of rheumatoid arthritis, macular degeneration, and squamous cell skin cancer, presented with epigastric pain, nausea and weight loss.  1. Sigmoid colon cancer, poorly differentiated adenocarcinoma, MSI-unknown  -I reviewed her colonoscopy findings and pathology results from the biopsies. -She has no significant regional lymphadenopathy, but has to large necrotic appearing periportal nodes, which is suspicious for metastasis. -her case was reviewed in GI tumor board, and PET scan was recommended  -She is reluctant to have surgery due to her advanced age.  -She has been seen by surgeon Dr. Brantley Stage. Surgical resection of the colon cancer came be considered, however, she may have metastatic disease given his abnormal appearing  periportal nodes. -I recommend EUS with periportal node biopsy but Dr. Ardis Hughs. Patient and her children agreed with the plan. -Given her advanced age and limited performance status, she is not a good candidate for systemic chemotherapy. If the biopsy confirms metastatic disease, I'll request Foundation one and MSI test, to see if she is a candidate for immunotherapy or other targeted therapy.  2. Periportal adenopathy -Possibly related to sigmoid colon cancer, would be distant metastasis if biopsy confirms. -Other secondary primary is not ruled out -Pending EUS and biopsy. -I spoke with interventional radiologist, percutaneous biopsy is also possible if EUS biopsy fails   Plan: -Dr. Ardis Hughs will repeat EUS and attempt fine-needle biopsy of the periportal node next week -PET scan asap -I will see her back after the above work up    All questions were answered. The patient knows to call the clinic with any problems, questions or concerns. I spent 55 minutes counseling the patient face to face. The total time spent in the appointment was 60 minutes and more than 50% was on counseling.     Truitt Merle, MD 05/18/2015 11:43 AM

## 2015-05-18 NOTE — Progress Notes (Signed)
  Oncology Nurse Navigator Documentation  Navigator Location: [facility] (05/18/15 1521) Navigator Encounter Type: Telephone (05/18/15 1521)  Called daughter, Ivin Booty back to let her know that Dr. Ardis Hughs is out of town until Monday. I will follow up with his office and her on Monday.         Patient Visit Type: MedOnc;Initial (05/18/15 1240) Treatment Phase: Other (05/18/15 1240) Barriers/Navigation Needs: Family concerns;Education;Coordination of Care (05/18/15 1240) Education: Coping with Diagnosis/ Prognosis;Pain/ Symptom Management;Newly Diagnosed Cancer Education;Transport During Treatment (05/18/15 1240) Interventions: Coordination of Care;Education Method (05/18/15 1240)   Coordination of Care: EUS (05/18/15 1240) Education Method: Verbal;Written;Teach-back (05/18/15 1240)  Support Groups/Services: GI Support Group;[employer] (05/18/15 1240)   Acuity: Level 2 (05/18/15 1240)         Time Spent with Patient: 60 (05/18/15 1240)

## 2015-05-19 ENCOUNTER — Other Ambulatory Visit: Payer: Self-pay

## 2015-05-19 ENCOUNTER — Encounter (HOSPITAL_COMMUNITY): Payer: Self-pay | Admitting: *Deleted

## 2015-05-19 ENCOUNTER — Telehealth: Payer: Self-pay

## 2015-05-19 DIAGNOSIS — I899 Noninfective disorder of lymphatic vessels and lymph nodes, unspecified: Secondary | ICD-10-CM

## 2015-05-19 DIAGNOSIS — C189 Malignant neoplasm of colon, unspecified: Secondary | ICD-10-CM

## 2015-05-19 NOTE — Telephone Encounter (Signed)
-----   Message from Milus Banister, MD sent at 05/19/2015  6:26 AM EST ----- Regarding: RE: EUS/Bx Manuela Schwartz I am out of town this week.  Can put her on for Thursday the 2nd.  I reviewed the films again.  The node does not directly abut the GI tract and so the chances of obtaining tissue is lower but certainly worth a try.   Esma Kilts, She needs upper eus, radial only. Thursday Feb 2nd, +MAC, for colon cancer, abdominal lymphnode.  Can you save this as a phone note as well.  thanks  DJ  ----- Message -----    From: Tania Ade, RN    Sent: 05/18/2015  12:35 PM      To: Milus Banister, MD, Barron Alvine, CMA Subject: EUS/Bx                                         [name] saw Center today and is requesting and EUS biopsy of the porta hepatis node(s). Is it possible to do procedure this Thursday? Call her daughter, Emmaline Life at [phone] with the appointment/prep info (she is staying with Everlene Farrier for the next 2 weeks)  Thanks, Cristy Friedlander.

## 2015-05-19 NOTE — Telephone Encounter (Signed)
EUS scheduled, pt instructed and medications reviewed.  Patient instructions mailed to home.  Patient to call with any questions or concerns.  

## 2015-05-20 ENCOUNTER — Telehealth: Payer: Self-pay | Admitting: Hematology

## 2015-05-20 ENCOUNTER — Telehealth: Payer: Self-pay | Admitting: *Deleted

## 2015-05-20 NOTE — Telephone Encounter (Signed)
pt to have a PET scan-[facility] to call [facility]

## 2015-05-20 NOTE — Telephone Encounter (Signed)
Spoke with daughter, and she agrees to PET scan in [place] at [facility] since [facility] does not have appointment till 06/03/15. Scheduled for 1/30 at 11:00/1130. Needs to be NPO 6 hours prior (daughter notified). Was given address of hospital and to enter in the [place] entrance. Explained the procedure and that she may eat afterwards.

## 2015-05-20 NOTE — Telephone Encounter (Signed)
  Oncology Nurse Navigator Documentation  Navigator Location: [facility] (05/20/15 1021) Navigator Encounter Type: Telephone (05/20/15 1021) Telephone: Outgoing Call (05/20/15 1021): Left the following information on daughter, Sharon's voice mail. Left my contact info if she needs to call back.  Case discussed in tumor board today. Reasonable to do PET scan to see if any other nodes are available to biopsy more easily. Doubt IR could biopsy due to location. Will proceed with EUS/biopsy on 05/28/15 as scheduled. Message to managed care regarding precert for PET scan. She will be called when PET scheduled.

## 2015-05-20 NOTE — Telephone Encounter (Signed)
Return call from daughter, Ivin Booty requesting nurse repeat and explain the plan for her. Again discussed the difficulty in accessing the periportal lesion via IR and PET is suggested. Still need to proceed with the EUS biopsy as scheduled. She granted approval to schedule the PET.

## 2015-05-21 ENCOUNTER — Encounter: Payer: Self-pay | Admitting: *Deleted

## 2015-05-21 ENCOUNTER — Encounter (HOSPITAL_COMMUNITY): Payer: Self-pay | Admitting: Anesthesiology

## 2015-05-21 NOTE — Progress Notes (Signed)
Oncology Nurse Navigator Documentation  Oncology Nurse Navigator Flowsheets 05/21/2015  Navigator Location [facility]  Navigator Encounter Type Telephone  Telephone Incoming Call;Symptom Mgt--constipation despite Miralax bid  Abnormal Finding Date -  Confirmed Diagnosis Date -  Patient Visit Type -  Treatment Phase -  Barriers/Navigation Needs -  Education  Symptom Management-push fluids, continue miralax. Add Senna-S #2 twice daily, then back down if she develops diarrhea  Interventions -  Coordination of Care -  Education Method Verbal;Teach-back  Support Groups/Services -  Acuity -  Time Spent with Patient 15  Call from her daughter, Ivin Booty.

## 2015-05-23 NOTE — Anesthesia Preprocedure Evaluation (Deleted)
Anesthesia Evaluation  Patient identified by MRN, date of birth, ID band Patient awake    Reviewed: Allergy & Precautions, NPO status , Patient's Chart, lab work & pertinent test results  Airway        Dental   Pulmonary Current Smoker,           Cardiovascular hypertension,   RBB   Neuro/Psych Depression negative neurological ROS     GI/Hepatic negative GI ROS, Neg liver ROS, GERD  Medicated,  Endo/Other  negative endocrine ROS  Renal/GU negative Renal ROS  negative genitourinary   Musculoskeletal negative musculoskeletal ROS (+)   Abdominal   Peds negative pediatric ROS (+)  Hematology negative hematology ROS (+)   Anesthesia Other Findings   Reproductive/Obstetrics negative OB ROS                             Anesthesia Physical Anesthesia Plan  ASA: III  Anesthesia Plan: MAC   Post-op Pain Management:    Induction: Intravenous  Airway Management Planned: Nasal Cannula  Additional Equipment:   Intra-op Plan:   Post-operative Plan:   Informed Consent: I have reviewed the patients History and Physical, chart, labs and discussed the procedure including the risks, benefits and alternatives for the proposed anesthesia with the patient or authorized representative who has indicated his/her understanding and acceptance.     Plan Discussed with:   Anesthesia Plan Comments:         Anesthesia Quick Evaluation

## 2015-05-24 ENCOUNTER — Encounter: Payer: Self-pay | Admitting: Hematology

## 2015-05-26 ENCOUNTER — Telehealth: Payer: Self-pay | Admitting: *Deleted

## 2015-05-26 ENCOUNTER — Ambulatory Visit
Admission: RE | Admit: 2015-05-26 | Discharge: 2015-05-26 | Disposition: A | Discharge status: Home / Self Care | Payer: Medicare Other | Source: Ambulatory Visit | Attending: Hematology | Admitting: Hematology

## 2015-05-26 DIAGNOSIS — C186 Malignant neoplasm of descending colon: Secondary | ICD-10-CM

## 2015-05-26 NOTE — Telephone Encounter (Signed)
Oncology Nurse Navigator Documentation  Oncology Nurse Navigator Flowsheets 05/26/2015  Navigator Location [facility]  Navigator Encounter Type Telephone  Telephone Incoming Call;Patient Update--had to reschedule PET to 05/27/15 because [place] had coffee with cream that morning (forgot).  Abnormal Finding Date -  Confirmed Diagnosis Date -  Patient Visit Type -  Treatment Phase -  Barriers/Navigation Needs -  Education -  Interventions -  Coordination of Care -  Education Method -  Support Groups/Services -  Acuity -  Time Spent with Patient -

## 2015-05-27 ENCOUNTER — Encounter
Admission: RE | Admit: 2015-05-27 | Discharge: 2015-05-27 | Disposition: A | Discharge status: Home / Self Care | Payer: Medicare Other | Source: Ambulatory Visit | Attending: Hematology | Admitting: Hematology

## 2015-05-27 DIAGNOSIS — C186 Malignant neoplasm of descending colon: Secondary | ICD-10-CM | POA: Diagnosis not present

## 2015-05-27 DIAGNOSIS — C189 Malignant neoplasm of colon, unspecified: Secondary | ICD-10-CM | POA: Diagnosis not present

## 2015-05-27 LAB — GLUCOSE, CAPILLARY: Glucose-Capillary: 93 mg/dL (ref 65–99)

## 2015-05-27 MED ORDER — FLUDEOXYGLUCOSE F - 18 (FDG) INJECTION
12.9900 | Freq: Once | INTRAVENOUS | Status: AC | PRN
  Administered 2015-05-27: 12.99 via INTRAVENOUS

## 2015-05-28 ENCOUNTER — Encounter (HOSPITAL_COMMUNITY): Admission: RE | Payer: Self-pay | Source: Ambulatory Visit

## 2015-05-28 ENCOUNTER — Ambulatory Visit (HOSPITAL_COMMUNITY): Admission: RE | Admit: 2015-05-28 | Payer: Medicare Other | Source: Ambulatory Visit | Admitting: Gastroenterology

## 2015-05-28 ENCOUNTER — Telehealth: Payer: Self-pay | Admitting: Gastroenterology

## 2015-05-28 ENCOUNTER — Telehealth: Payer: Self-pay | Admitting: *Deleted

## 2015-05-28 HISTORY — DX: Malignant (primary) neoplasm, unspecified: C80.1

## 2015-05-28 HISTORY — DX: Other specified postprocedural states: Z98.890

## 2015-05-28 HISTORY — DX: Nausea with vomiting, unspecified: R11.2

## 2015-05-28 HISTORY — DX: Other complications of anesthesia, initial encounter: T88.59XA

## 2015-05-28 HISTORY — DX: Gastro-esophageal reflux disease without esophagitis: K21.9

## 2015-05-28 HISTORY — DX: Adverse effect of unspecified anesthetic, initial encounter: T41.45XA

## 2015-05-28 HISTORY — DX: Malignant neoplasm of colon, unspecified: C18.9

## 2015-05-28 SURGERY — UPPER ENDOSCOPIC ULTRASOUND (EUS) RADIAL
Anesthesia: Monitor Anesthesia Care

## 2015-05-28 MED ORDER — PROPOFOL 10 MG/ML IV BOLUS
INTRAVENOUS | Status: AC
  Filled 2015-05-28: qty 40

## 2015-05-28 NOTE — Telephone Encounter (Signed)
Received vm call this am from Merceda Elks, GI navigator with transfer vm call from pt's daughter, Rondall Allegra. results of PET scan & needs to know if pt is to have a biopsy.  Daughter did not leave a DOB so unable to find pt.  Left message at daughter's ph # to call back with DOB. Daughter called back & found pt & PET result is in chart.  Will leave message for Dr Burr Medico to call daughter with results & plan of care.  Daughter did not take pt for biopsy today since she had not heard from anyone.  Call back # is [phone]

## 2015-05-28 NOTE — Telephone Encounter (Signed)
Joann Herrera, She did not show for EUS this morning (was to FNA the periportal nodal mass).  We'll contact her to see about rescheduling it.  [name], She needs upper eus, radial +/- linear, next available EUS Thursday with MAC.  Thanks

## 2015-05-28 NOTE — Telephone Encounter (Signed)
I did see the PET. thanks

## 2015-05-28 NOTE — Telephone Encounter (Signed)
Thanks [name]. I assume that you saw her PET scan report, the periportal nodes are very positive, no other distant mets.   Joann Herrera

## 2015-05-28 NOTE — Telephone Encounter (Signed)
I called pt's daughter Ivin Booty and son Elta Guadeloupe. They did not take pt to do the EUS and biopsy because they thought she may not need the biopsy. I explained her PET scan result to them, and discussed that her portal nodes are likely malignant, probably related to her colon cancer. However,  Definitive diagnosis would require biopsy. They're willing to reschedule the EUS and biopsy 2 next week.  Patient has significant amount abdominal pain, and nausea and dizziness. I suggest patient to continue zofran and tramadol for symptoms, and I suggest her daughter to take her to emergency room if she is not able to drink enough, or call us in the morning for IV fluids appointment tomorrow morning. She voiced good understanding and agrees with the plan.  Truitt Merle  05/28/2015

## 2015-05-29 ENCOUNTER — Telehealth: Payer: Self-pay | Admitting: *Deleted

## 2015-05-29 NOTE — Telephone Encounter (Signed)
[name], please let her know that I will see her on Tuesday at 1:30pm. Thanks.  Truitt Merle

## 2015-05-29 NOTE — Telephone Encounter (Signed)
Voice mail from daughter, Ivin Booty: They have decided not to pursue the EUS biopsy. Are requesting an appointment next week with [name]. Called back and left VM that her message was received and I will call her back as soon as [name] responds to the appointment request.

## 2015-05-29 NOTE — Telephone Encounter (Signed)
Called daughter and provided her the appointment with [name] on 06/02/15 at 1:30 pm.

## 2015-06-02 ENCOUNTER — Telehealth: Payer: Self-pay | Admitting: Hematology

## 2015-06-02 ENCOUNTER — Ambulatory Visit (HOSPITAL_BASED_OUTPATIENT_CLINIC_OR_DEPARTMENT_OTHER): Payer: Medicare Other | Admitting: Hematology

## 2015-06-02 ENCOUNTER — Encounter: Payer: Self-pay | Admitting: Hematology

## 2015-06-02 VITALS — BP 111/56 | HR 63 | Temp 97.9°F | Resp 18 | Ht 63.0 in | Wt 136.4 lb

## 2015-06-02 DIAGNOSIS — C187 Malignant neoplasm of sigmoid colon: Secondary | ICD-10-CM | POA: Diagnosis not present

## 2015-06-02 DIAGNOSIS — R112 Nausea with vomiting, unspecified: Secondary | ICD-10-CM | POA: Diagnosis not present

## 2015-06-02 DIAGNOSIS — R530 Neoplastic (malignant) related fatigue: Secondary | ICD-10-CM | POA: Diagnosis not present

## 2015-06-02 DIAGNOSIS — E46 Unspecified protein-calorie malnutrition: Secondary | ICD-10-CM

## 2015-06-02 DIAGNOSIS — C186 Malignant neoplasm of descending colon: Secondary | ICD-10-CM

## 2015-06-02 DIAGNOSIS — R1013 Epigastric pain: Secondary | ICD-10-CM | POA: Diagnosis not present

## 2015-06-02 MED ORDER — PROCHLORPERAZINE MALEATE 5 MG PO TABS
5.0000 mg | ORAL_TABLET | Freq: Four times a day (QID) | ORAL | Status: DC | PRN
Start: 2015-06-02 — End: 2015-07-09

## 2015-06-02 MED ORDER — DEXAMETHASONE 4 MG PO TABS: 4.0000 mg | ORAL_TABLET | Freq: Every day | ORAL | Status: DC

## 2015-06-02 NOTE — Telephone Encounter (Signed)
Talked with and scheduled this patient’s appointment(s) while patient was here in our office.       [facility]. °

## 2015-06-02 NOTE — Progress Notes (Signed)
[facility]  Telephone:([phone]) [phone] Fax:(336) [phone]  Clinic Follow up consult Note   Patient Care Team: [name], MD as PCP - General (Internal Medicine) Truitt Merle, MD as Consulting Physician (Hematology) Erroll Luna, MD as Consulting Physician (General Surgery) Milus Banister, MD as Attending Physician (Gastroenterology) 06/02/2015  REFERRAL PHYSCIAN: Dr. Ardis Hughs   CHIEF COMPLAINTS:  Follow up metastatic colon cancer   HISTORY OF PRESENTING ILLNESS:  Joann Herrera 82 y.o. female is here because of recently diagnosed colon cancer. She is accompanied by her daughter and son to the clinic today.  She has had upper abdominal pain and intermittent nausea and vomiting for almost 2 years, she had multiple ED visit and hospitalization. She describes her pain is located in the epigastric area, usually happens after meals, 5/10 , radiates to back, with associated nausea and vomiting. She has moderate fatigue, she is no energy to do much activity except self cares. She has intermittent dizziness, especially when she stands up. Her appetite is low, she eats small meals. she had intermittent diarrhea for a few years with lossoe BM, but recently developed mild to moderate constipation, she lasot about 15 lbs in the past few years   She was referred to gastroenterologist Dr. Ardis Hughs, and underwent EGD on 01/22/2015, which was unremarkable. Abdominal ultrasound on 03/17/2015 showed gallbladder stone, and a 3.3 cm hypoechoic mass in the portal hepatitis. She subsequently underwent abdominal MRI on 04/30/2015, which showed a 2 cm short axis node in the porta hepatis and additional 1.4 cm port cable node, with central necrosis. She underwent a colonoscopy on 04/30/2015, which showed multiple polyps and a malignant appearing mass in the sigmoid colon, the biopsy confirmed adenocarcinoma. She was seen by surgeon Dr. Brantley Stage.  CURRENT THERAPY: supportive care  INTERIM  HISTORY: Amyrah returns for follow-up. She is accompanied by her son and daughter. She has been feeling very fatigued, with no appetite, and occasional nausea and vomiting and diarrhea. She had a PET scan done last week, but did not show up the EUS and biopsy procedure which was scheduled for her last Thursday. I encouraged her to coming in for IV fluids last week, but she declined. No fever or chills, her weight is stable in the past 2 weeks.  MEDICAL HISTORY:  Past Medical History  Diagnosis Date  . Pneumonia 04/2012  . Rheumatoid Arthritis   . Macular degeneration   . Complication of anesthesia     "morphine caused severe shakes"  . PONV (postoperative nausea and vomiting)   . GERD (gastroesophageal reflux disease)   . Cancer (HCC)     squamous cell  . Colon carcinoma ([place])     dx. left Colon cancer,evidence of metastasis    SURGICAL HISTORY: Past Surgical History  Procedure Laterality Date  . Total knee arthroplasty      x 2  . Hemorrhoid surgery    . Cataracts    . Hand surgery      Pt states she had both hands operated on for arthritis.  . Shoulder surgery Left     arthroscopy  . Total knee arthroplasty Right   . Total knee arthroplasty Left   . Hand surgery    . Cataract extraction, bilateral    . Tonsillectomy    . Shoulder arthroscopy Left   . Minor hemorrhoidectomy    . Esophagogastroduodenoscopy N/A 01/22/2015    Procedure: ESOPHAGOGASTRODUODENOSCOPY (EGD);  Surgeon: Milus Banister, MD;  Location: [facility];  Service: Endoscopy;  Laterality: N/A;  . Joint replacement    . Eye surgery      bilateral cataracts  . Colonoscopy with propofol N/A 04/30/2015    Procedure: COLONOSCOPY WITH PROPOFOL;  Surgeon: Milus Banister, MD;  Location: [facility];  Service: Endoscopy;  Laterality: N/A;    SOCIAL HISTORY: Social History   Social History  . Marital Status: Widowed    Spouse Name: N/A  . Number of Children: 3  . Years of Education: N/A   Occupational  History  . Retired    Social History Main Topics  . Smoking status: Current Every Day Smoker -- 1.00 packs/day  . Smokeless tobacco: Never Used  . Alcohol Use: No  . Drug Use: No  . Sexual Activity: Not on file   Other Topics Concern  . Not on file   Social History Narrative   ** Merged History Encounter **       Daughter in [place] Ivin Booty [place]) [phone]   Son in [place]   Son in [place]   Retired- Medical sales representative business, worked at [employer] for 28 years   Enjoys dancing and going to [place]   Completed 11th grade   Very poor vision due to macular degeneration          FAMILY HISTORY: Family History  Problem Relation Age of Onset  . Arthritis Mother   . Arthritis-Osteo Mother   . Cancer Sister 18    breast  . Diabetes Sister   . Diabetes Brother   . Suicidality Brother   . Diabetes Brother   . Breast cancer Sister   . Cancer Maternal Aunt     breast cancer     ALLERGIES:  is allergic to aspirin; codeine; morphine and related; and penicillins.  MEDICATIONS:  Current Outpatient Prescriptions  Medication Sig Dispense Refill  . acetaminophen (TYLENOL) 500 MG tablet Take 1,000 mg by mouth every 6 (six) hours as needed. For pain    . CALCIUM-MAGNESIUM-ZINC PO Take 1 tablet by mouth daily.    . Cyanocobalamin (VITAMIN B-12 PO) Take 1 tablet by mouth daily.    Marland Kitchen dimenhyDRINATE (DRAMAMINE) 50 MG tablet Take 25 mg by mouth at bedtime as needed for dizziness.     . Multiple Vitamin (MULTIVITAMIN) tablet Take 1 tablet by mouth daily.    . ondansetron (ZOFRAN) 8 MG tablet Take 1 tablet (8 mg total) by mouth every 8 (eight) hours as needed for nausea. 30 tablet 3  . polyethylene glycol (MIRALAX / GLYCOLAX) packet Take 17 g by mouth 2 (two) times daily.    . promethazine (PHENERGAN) 25 MG tablet Take 1 tablet (25 mg total) by mouth every 8 (eight) hours as needed for nausea or vomiting. 30 tablet 5  . sennosides-docusate sodium  (SENOKOT-S) 8.6-50 MG tablet Take 2 tablets by mouth 2 (two) times daily. Decrease for diarrhea    . traMADol (ULTRAM) 50 MG tablet Take 0.5 tablets (25 mg total) by mouth every 6 (six) hours as needed. (Patient taking differently: Take 25 mg by mouth every 6 (six) hours as needed for moderate pain. ) 20 tablet 0   No current facility-administered medications for this visit.    REVIEW OF SYSTEMS:   Constitutional: Denies fevers, chills or abnormal night sweats Eyes: Denies blurriness of vision, double vision or watery eyes Ears, nose, mouth, throat, and face: Denies mucositis or sore throat Respiratory: Denies cough, dyspnea or wheezes Cardiovascular: Denies palpitation, chest discomfort or lower extremity  swelling Gastrointestinal:  Denies nausea, heartburn or change in bowel habits Skin: Denies abnormal skin rashes Lymphatics: Denies new lymphadenopathy or easy bruising Neurological:Denies numbness, tingling or new weaknesses Behavioral/Psych: Mood is stable, no new changes  All other systems were reviewed with the patient and are negative.  PHYSICAL EXAMINATION: ECOG PERFORMANCE STATUS: 2 - Symptomatic, <50% confined to bed  Filed Vitals:   06/02/15 1409  BP: 111/56  Pulse: 63  Temp: 97.9 F (36.6 C)  Resp: 18   Filed Weights   06/02/15 1409  Weight: 136 lb 6.4 oz (61.871 kg)    GENERAL:alert, no distress and comfortable SKIN: skin color, texture, turgor are normal, no rashes or significant lesions EYES: normal, conjunctiva are pink and non-injected, sclera clear OROPHARYNX:no exudate, no erythema and lips, buccal mucosa, and tongue normal  NECK: supple, thyroid normal size, non-tender, without nodularity LYMPH:  no palpable lymphadenopathy in the cervical, axillary or inguinal LUNGS: clear to auscultation and percussion with normal breathing effort HEART: regular rate & rhythm and no murmurs and no lower extremity edema ABDOMEN:abdomen soft, non-tender and normal bowel  sounds Musculoskeletal:no cyanosis of digits and no clubbing  PSYCH: alert & oriented x 3 with fluent speech NEURO: no focal motor/sensory deficits  LABORATORY DATA:  I have reviewed the data as listed CBC Latest Ref Rng 04/21/2015 03/23/2015 01/02/2015  WBC 4.0 - 10.5 K/uL 7.3 5.4 6.0  Hemoglobin 12.0 - 15.0 g/dL 12.4 12.5 11.9(L)  Hematocrit 36.0 - 46.0 % 37.3 37.7 35.5(L)  Platelets 150.0 - 400.0 K/uL 314.0 174.0 276.0    CMP Latest Ref Rng 04/21/2015 03/23/2015 01/02/2015  Glucose 70 - 99 mg/dL 102(H) 96 -  BUN 6 - 23 mg/dL 19 19 -  Creatinine 0.40 - 1.20 mg/dL 1.10 0.97 -  Sodium 135 - 145 mEq/L 141 139 -  Potassium 3.5 - 5.1 mEq/L 4.2 4.4 -  Chloride 96 - 112 mEq/L 106 106 -  CO2 19 - 32 mEq/L 29 26 -  Calcium 8.4 - 10.5 mg/dL 9.5 9.3 -  Total Protein 6.0 - 8.3 g/dL 7.0 6.9 6.8  Total Bilirubin 0.2 - 1.2 mg/dL 0.3 0.3 0.3  Alkaline Phos 39 - 117 U/L 73 69 70  AST 0 - 37 U/L '10 11 13  '$ ALT 0 - 35 U/L '7 8 10    '$ PATHOLOGY REPORT  Diagnosis 04/29/2014 1. Colon, biopsy, ascending - TUBULAR ADENOMA. NO HIGH GRADE DYSPLASIA OR MALIGNANCY IDENTIFIED. 2. Colon, biopsy, 35 cm sigmoid mass r/o malignancy - POORLY DIFFERENTIATED INVASIVE ADENOCARCINOMA. 3. Colon, polyp(s), sigmoid - TUBULAR ADENOMA. NO HIGH GRADE DYSPLASIA OR MALIGNANCY IDENTIFIED.   RADIOGRAPHIC STUDIES: I have personally reviewed the radiological images as listed and agreed with the findings in the report. Ct Chest W Contrast  05/11/2015  CLINICAL DATA:  Newly diagnosed colon cancer, evaluate for metastatic disease EXAM: CT CHEST WITH CONTRAST TECHNIQUE: Multidetector CT imaging of the chest was performed during intravenous contrast administration. CONTRAST:  31m OMNIPAQUE IOHEXOL 300 MG/ML  SOLN COMPARISON:  Partial comparison to MRI abdomen dated 04/30/2015 and CT abdomen pelvis dated 04/16/2015. FINDINGS: Mediastinum/Nodes: The heart is normal in size. No pericardial effusion. Coronary atherosclerosis in the LAD  and right coronary artery. Atherosclerotic calcifications aortic arch. Small mediastinal lymph nodes, including an 8 mm short axis low right paratracheal node (series 2/image 20), within normal limits. Visualized thyroid is unremarkable. Lungs/Pleura: No suspicious pulmonary nodules. Moderate centrilobular and paraseptal emphysematous changes. No focal consolidation. No pleural effusion or pneumothorax. Upper abdomen: Visualized upper abdomen  is notable for necrotic nodes in the porta hepatis measuring up to 1.8 cm short axis (series 2/ image 57), suspicious for nodal metastases given the clinical history. Layering gallstones. Musculoskeletal: Degenerative changes of the visualized thoracolumbar spine. Mild to moderate superior endplate compression fracture deformity at T7 (sagittal image 55), age indeterminate but likely chronic. IMPRESSION: No evidence of metastatic disease in the chest. Necrotic lymph nodes in the upper abdomen measuring up to 1.8 cm, suspicious for nodal metastases. Electronically Signed   By: Julian Hy M.D.   On: 05/11/2015 15:56   Nm Pet Image Initial (pi) Skull Base To Thigh  05/27/2015  CLINICAL DATA:  Subsequent treatment strategy for colorectal carcinoma. EXAM: NUCLEAR MEDICINE PET SKULL BASE TO THIGH TECHNIQUE: 12.9 mCi F-18 FDG was injected intravenously. Full-ring PET imaging was performed from the skull base to thigh after the radiotracer. CT data was obtained and used for attenuation correction and anatomic localization. FASTING BLOOD GLUCOSE:  Value: 93 mg/dl COMPARISON:  CT 04/16/2015, MRI 04/30/2015 FINDINGS: NECK No hypermetabolic lymph nodes in the neck. CHEST No hypermetabolic mediastinal or hilar nodes. No suspicious pulmonary nodules on the CT scan. ABDOMEN/PELVIS Cluster of intensely hypermetabolic enlarged porta hepatis lymph nodes. A 23 mm lymph node position between the pancreas and LEFT hepatic lobe (image 131, series 3) with SUV max equal 17. Similar  hypermetabolic lymph node anterior to the IVC measuring 19 mm short axis with equal metabolic activity. No focal abnormal metabolic activity in the liver or pancreas . There is region of hypermetabolic activity in the proximal descending colon just below the splenic flexure with asymmetric wall thickening and SUV max equal 14.6 (image 149, series 3). This asymmetric wall thickening is subtle measuring 8 mm. Second focus of hypermetabolic asymmetric wall thickening in the distal sigmoid colon/rectum (image 203 series 3 defined is similar 9 mm thickness and SUV max equal 11.1. No hypermetabolic retroperitoneal or iliac lymph nodes. SKELETON No focal hypermetabolic activity to suggest skeletal metastasis. IMPRESSION: 1. Intensely hypermetabolic periportal lymph nodes are most consistent with metastatic adenopathy. 2. Two foci of asymmetric metabolic activity within the colon associated with single wall thickening. One lesion in the distal sigmoid colon / proximal rectum and a second in the proximal descending colon. Findings are concerning for synchronous carcinoma or pre carcinoma lesions. Electronically Signed   By: Suzy Bouchard M.D.   On: 05/27/2015 14:39   Colonoscopy 04/30/2015 Dr. Ardis Hughs  1. Three polyps were found, removed, two were sent to pathology (jar 1). 2. 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 This was located in proximal sigmoid colon (35-40cm from anus), was biopsied extensively (jar 2) and then the site was labeled with submucosal SPOT injection. 3. There was a large polypoid lesion in the distal sigmoid (15-17cm from anus) that was 3.5cm across, fairly soft but clearly too large for endoscopic resection. This was extensively biopsied (jar 3) and also labeled with submcosal injection of SPOT. 4. There were two to three small sessile satelite polyps located just distal to the distal sigmoid mass (not removed). There were numerous  diverticulum in the left colon. The examination was otherwise normal   EGD ENDOSCOPIC IMPRESSION: 01/22/2015 There was mild, pan gastric atrophic changes (atrophic gastritis). This was biopsied and sent to pathology. There was a 2cm hiatal hernia. The examination was otherwise normal  RECOMMENDATIONS: If biopsies are positive for H. pylori, will recommend that you restart you antibiotics and will prophylax with scheduled antinausea medicines.  RECOMMENDATIONS: Await final pathology. MRI/MRCP pending as well (to evaluate the periportal lesion). My office will arrange referral to medical oncology and general surgery. The periportal lesion may be a site of distant metastasis or be a second issue. May require further evaluation after MRI (PET scan vs. EUS).    ASSESSMENT & PLAN:  82 year old female, with past medical history of rheumatoid arthritis, macular degeneration, and squamous cell skin cancer, presented with epigastric pain, nausea and weight loss.  1. Sigmoid colon cancer, cTxNxM1, clinical stage IV with periportal node metastases, poorly differentiated adenocarcinoma, MSI-unknown  -I previously reviewed her colonoscopy findings and pathology results from the biopsies. -She has no significant regional lymphadenopathy, but has two large necrotic appearing periportal nodes, which are very suspicious for metastasis. -I reviewed her PET scan findings with patient and her family members, which showed hypermetabolic periportal lymph nodes and sigmoid colon mass, no other metastasis. No other primary tumor was seen on the PET scan, I think the PET portal lymphadenopathy are likely distant metastasis from her colon cancer. -she was scheduled for EUS with periportal node biopsy by Dr. Ardis Hughs last week but she did not go, she declined further biopsy.  -Given her advanced age and limited performance status, she is not a good candidate for systemic chemotherapy. -I discussed the option of palliative  radiation to the periportal lymph node, to alleviate her pain. Potential benefits and side effects of radiation were discussed with her, patient and her children are reluctant to have radiation, due to her advanced age and her current symptoms. -I discussed the other options of treatment, such as immunotherapy or targeted therapy. I recommend Foundation one test, to see if she is a candidate for such testing. She agrees. -The alternative management options of palliative care alone, such as hospice, discussed with patient. She seems to be agreeable with hospice if there are no treatment options. I recommend palliative care referral, for her home care needs, she agrees.   2. Abdominal pain -I give her prescription of tramadol on last visit, I encouraged her to pick up and try. She agrees -She does not want the context 1L.  3. Nausea and vomiting -Her insurance does not cover Zofran, she is taking Phenergan as needed, but she is quite drowsy from the medication -I called in Compazine 5 mg as needed for nausea, to replace Phenergan  4. Fatigue and malnutrition -I encouraged her to take nutrition supplement   Plan: -Palliative care home program referral -She'll try tramadol and Compazine -I called pathology today and requested Foundation one -I'll see her back in 3 weeks  All questions were answered. The patient knows to call the clinic with any problems, questions or concerns. I spent 30 minutes counseling the patient face to face. The total time spent in the appointment was 40 minutes and more than 50% was on counseling.     Truitt Merle, MD 06/02/2015 8:19 AM

## 2015-06-03 ENCOUNTER — Other Ambulatory Visit (HOSPITAL_COMMUNITY)
Admission: RE | Admit: 2015-06-03 | Discharge: 2015-06-03 | Disposition: A | Discharge status: Home / Self Care | Payer: Medicare Other | Source: Ambulatory Visit | Attending: Hematology | Admitting: Hematology

## 2015-06-03 ENCOUNTER — Telehealth: Payer: Self-pay | Admitting: *Deleted

## 2015-06-03 DIAGNOSIS — C186 Malignant neoplasm of descending colon: Secondary | ICD-10-CM

## 2015-06-03 DIAGNOSIS — C189 Malignant neoplasm of colon, unspecified: Secondary | ICD-10-CM | POA: Insufficient documentation

## 2015-06-03 NOTE — Telephone Encounter (Signed)
Oncology Nurse Navigator Documentation  Oncology Nurse Navigator Flowsheets 06/03/2015  Navigator Location [facility]  Navigator Encounter Type Telephone  Telephone Outgoing Call  Abnormal Finding Date -  Confirmed Diagnosis Date -  Patient Visit Type -  Treatment Phase -  Barriers/Navigation Needs Coordination of Care  Education -  Interventions Referrals--called Hospice intake and provided info for referral to Palliative Team-speak with daughter or son  Referrals Palliative Care  Coordination of Care -  Education Method -  Support Groups/Services -  Acuity -  Time Spent with Patient 15

## 2015-06-04 DIAGNOSIS — Z961 Presence of intraocular lens: Secondary | ICD-10-CM | POA: Diagnosis not present

## 2015-06-04 DIAGNOSIS — H353190 Nonexudative age-related macular degeneration, unspecified eye, stage unspecified: Secondary | ICD-10-CM | POA: Diagnosis not present

## 2015-06-04 DIAGNOSIS — H33311 Horseshoe tear of retina without detachment, right eye: Secondary | ICD-10-CM | POA: Diagnosis not present

## 2015-06-04 DIAGNOSIS — H04123 Dry eye syndrome of bilateral lacrimal glands: Secondary | ICD-10-CM | POA: Diagnosis not present

## 2015-06-10 DIAGNOSIS — R531 Weakness: Secondary | ICD-10-CM | POA: Diagnosis not present

## 2015-06-18 ENCOUNTER — Encounter (HOSPITAL_COMMUNITY): Payer: Self-pay

## 2015-06-19 ENCOUNTER — Telehealth: Payer: Self-pay | Admitting: Hematology

## 2015-06-19 ENCOUNTER — Other Ambulatory Visit: Payer: Self-pay | Admitting: Hematology

## 2015-06-19 NOTE — Telephone Encounter (Signed)
I called pt and her son Elta Guadeloupe and left a message on Mark's cell phone regarding the FoundationOne test result. It shows MSI-HIGH, which predicts current response to immunotherapy. I encourage patient to call me back if she is interested. She is scheduled to see me on March 3, unfortunately I'll be out of his uptake, I'll request her appointment to be rescheduled for the following week.  Truitt Merle  06/19/2015

## 2015-06-20 ENCOUNTER — Other Ambulatory Visit: Payer: Self-pay | Admitting: Hematology

## 2015-06-22 ENCOUNTER — Telehealth: Payer: Self-pay | Admitting: Hematology

## 2015-06-22 NOTE — Telephone Encounter (Signed)
Called pt to inform her of her appt change 3/3. Pt advised me to contact her son and give him the information. Was unable to reach so kleft message giving him the Appt date/time 3/10 @ 1245pm

## 2015-06-25 ENCOUNTER — Telehealth: Payer: Self-pay

## 2015-06-25 ENCOUNTER — Other Ambulatory Visit: Payer: Self-pay | Admitting: *Deleted

## 2015-06-25 DIAGNOSIS — C186 Malignant neoplasm of descending colon: Secondary | ICD-10-CM

## 2015-06-25 MED ORDER — TRAMADOL HCL 50 MG PO TABS: 25.0000 mg | ORAL_TABLET | Freq: Four times a day (QID) | ORAL | Status: DC | PRN

## 2015-06-25 NOTE — Telephone Encounter (Signed)
Patient's son calling and asking for a refill on patient's tramadol to be filled at [facility].

## 2015-06-26 ENCOUNTER — Telehealth: Payer: Self-pay | Admitting: *Deleted

## 2015-06-26 ENCOUNTER — Ambulatory Visit: Payer: Medicare Other | Admitting: Hematology

## 2015-06-26 ENCOUNTER — Other Ambulatory Visit: Payer: Medicare Other

## 2015-06-26 NOTE — Telephone Encounter (Signed)
  Oncology Nurse Navigator Documentation  Navigator Location: [facility] (06/26/15 1625) Navigator Encounter Type: Telephone (06/26/15 1625) Telephone: Outgoing Call;Diagnostic Results (06/26/15 1625) : Returned call to son, [name] regarding message MD had left for him last week. He could not understand the message due to she was speaking softly and he erased message. Made him aware that her Foundation One test showed she was MSI high, which means she could have a good response to immunotherapy. Will discuss at her visit on 3/10. He reports he will not be able to be there, but his sister will accompany Mazy to the appointment.

## 2015-07-03 ENCOUNTER — Telehealth: Payer: Self-pay | Admitting: Hematology

## 2015-07-03 ENCOUNTER — Ambulatory Visit (HOSPITAL_BASED_OUTPATIENT_CLINIC_OR_DEPARTMENT_OTHER): Payer: Medicare Other | Admitting: Hematology

## 2015-07-03 ENCOUNTER — Ambulatory Visit (HOSPITAL_BASED_OUTPATIENT_CLINIC_OR_DEPARTMENT_OTHER): Payer: Medicare Other

## 2015-07-03 ENCOUNTER — Other Ambulatory Visit: Payer: Medicare Other

## 2015-07-03 ENCOUNTER — Encounter: Payer: Self-pay | Admitting: Hematology

## 2015-07-03 ENCOUNTER — Ambulatory Visit: Payer: Medicare Other

## 2015-07-03 VITALS — BP 111/45 | HR 60 | Temp 98.3°F | Resp 18 | Ht 63.0 in | Wt 136.7 lb

## 2015-07-03 DIAGNOSIS — C772 Secondary and unspecified malignant neoplasm of intra-abdominal lymph nodes: Secondary | ICD-10-CM | POA: Diagnosis not present

## 2015-07-03 DIAGNOSIS — C186 Malignant neoplasm of descending colon: Secondary | ICD-10-CM

## 2015-07-03 DIAGNOSIS — C187 Malignant neoplasm of sigmoid colon: Secondary | ICD-10-CM | POA: Diagnosis not present

## 2015-07-03 DIAGNOSIS — R1013 Epigastric pain: Secondary | ICD-10-CM

## 2015-07-03 DIAGNOSIS — R530 Neoplastic (malignant) related fatigue: Secondary | ICD-10-CM

## 2015-07-03 DIAGNOSIS — E46 Unspecified protein-calorie malnutrition: Secondary | ICD-10-CM

## 2015-07-03 DIAGNOSIS — R112 Nausea with vomiting, unspecified: Secondary | ICD-10-CM | POA: Diagnosis not present

## 2015-07-03 LAB — CBC WITH DIFFERENTIAL/PLATELET
BASO%: 1.1 % (ref 0.0–2.0)
BASOS ABS: 0.1 10*3/uL (ref 0.0–0.1)
EOS%: 3.2 % (ref 0.0–7.0)
Eosinophils Absolute: 0.2 10*3/uL (ref 0.0–0.5)
HCT: 37 % (ref 34.8–46.6)
HEMOGLOBIN: 12.1 g/dL (ref 11.6–15.9)
LYMPH%: 36.7 % (ref 14.0–49.7)
MCH: 28.1 pg (ref 25.1–34.0)
MCHC: 32.7 g/dL (ref 31.5–36.0)
MCV: 86 fL (ref 79.5–101.0)
MONO#: 0.4 10*3/uL (ref 0.1–0.9)
MONO%: 8 % (ref 0.0–14.0)
NEUT#: 2.8 10*3/uL (ref 1.5–6.5)
NEUT%: 51 % (ref 38.4–76.8)
Platelets: 294 10*3/uL (ref 145–400)
RBC: 4.3 10*6/uL (ref 3.70–5.45)
RDW: 14.3 % (ref 11.2–14.5)
WBC: 5.5 10*3/uL (ref 3.9–10.3)
lymph#: 2 10*3/uL (ref 0.9–3.3)

## 2015-07-03 LAB — COMPREHENSIVE METABOLIC PANEL
ALBUMIN: 3.5 g/dL (ref 3.5–5.0)
ALK PHOS: 82 U/L (ref 40–150)
ANION GAP: 11 meq/L (ref 3–11)
AST: 10 U/L (ref 5–34)
BILIRUBIN TOTAL: 0.3 mg/dL (ref 0.20–1.20)
BUN: 13.2 mg/dL (ref 7.0–26.0)
CALCIUM: 9 mg/dL (ref 8.4–10.4)
CO2: 23 meq/L (ref 22–29)
CREATININE: 1 mg/dL (ref 0.6–1.1)
Chloride: 105 mEq/L (ref 98–109)
EGFR: 52 mL/min/{1.73_m2} — ABNORMAL LOW (ref >90)
Glucose: 88 mg/dl (ref 70–140)
Potassium: 4.3 mEq/L (ref 3.5–5.1)
Sodium: 139 mEq/L (ref 136–145)
TOTAL PROTEIN: 7.2 g/dL (ref 6.4–8.3)

## 2015-07-03 NOTE — Progress Notes (Signed)
[place]  [phone] [phone]  Clinic Follow up consult Note   Patient Care Team: [name], MD as PCP - General (Internal Medicine) Truitt Merle, MD as Consulting Physician (Hematology) Erroll Luna, MD as Consulting Physician (General Surgery) Milus Banister, MD as Attending Physician (Gastroenterology) 07/03/2015  REFERRAL PHYSCIAN: Dr. Ardis Hughs   CHIEF COMPLAINTS:  Follow up metastatic colon cancer   HISTORY OF PRESENTING ILLNESS:  Joann Herrera 82 y.o. female is here because of recently diagnosed colon cancer. She is accompanied by her daughter and son to the clinic today.  She has had upper abdominal pain and intermittent nausea and vomiting for almost 2 years, she had multiple ED visit and hospitalization. She describes her pain is located in the epigastric area, usually happens after meals, 5/10 , radiates to back, with associated nausea and vomiting. She has moderate fatigue, she is no energy to do much activity except self cares. She has intermittent dizziness, especially when she stands up. Her appetite is low, she eats small meals. she had intermittent diarrhea for a few years with lossoe BM, but recently developed mild to moderate constipation, she lasot about 15 lbs in the past few years   She was referred to gastroenterologist Dr. Ardis Hughs, and underwent EGD on 01/22/2015, which was unremarkable. Abdominal ultrasound on 03/17/2015 showed gallbladder stone, and a 3.3 cm hypoechoic mass in the portal hepatitis. She subsequently underwent abdominal MRI on 04/30/2015, which showed a 2 cm short axis node in the porta hepatis and additional 1.4 cm port cable node, with central necrosis. She underwent a colonoscopy on 04/30/2015, which showed multiple polyps and a malignant appearing mass in the sigmoid colon, the biopsy confirmed adenocarcinoma. She was seen by surgeon Dr. Brantley Stage.  CURRENT THERAPY: supportive care  INTERIM  HISTORY: [name] returns for follow-up. She is accompanied by her daughter. Her abdominal pain is moderate and stable, she takes tramadol at night. She still has moderate fatigue, and a little appetite. No other new complaints. She is able to take  Care of herself at home.    MEDICAL HISTORY:  Past Medical History  Diagnosis Date  . Pneumonia 04/2012  . Rheumatoid Arthritis   . Macular degeneration   . Complication of anesthesia     "morphine caused severe shakes"  . PONV (postoperative nausea and vomiting)   . GERD (gastroesophageal reflux disease)   . Cancer (HCC)     squamous cell  . Colon carcinoma ([place])     dx. left Colon cancer,evidence of metastasis    SURGICAL HISTORY: Past Surgical History  Procedure Laterality Date  . Total knee arthroplasty      x 2  . Hemorrhoid surgery    . Cataracts    . Hand surgery      Pt states she had both hands operated on for arthritis.  . Shoulder surgery Left     arthroscopy  . Total knee arthroplasty Right   . Total knee arthroplasty Left   . Hand surgery    . Cataract extraction, bilateral    . Tonsillectomy    . Shoulder arthroscopy Left   . Minor hemorrhoidectomy    . Esophagogastroduodenoscopy N/A 01/22/2015    Procedure: ESOPHAGOGASTRODUODENOSCOPY (EGD);  Surgeon: Milus Banister, MD;  Location: [facility];  Service: Endoscopy;  Laterality: N/A;  . Joint replacement    . Eye surgery      bilateral cataracts  . Colonoscopy with propofol N/A 04/30/2015    Procedure: COLONOSCOPY  WITH PROPOFOL;  Surgeon: Milus Banister, MD;  Location: [facility];  Service: Endoscopy;  Laterality: N/A;    SOCIAL HISTORY: Social History   Social History  . Marital Status: Widowed    Spouse Name: N/A  . Number of Children: 3  . Years of Education: N/A   Occupational History  . Retired    Social History Main Topics  . Smoking status: Current Every Day Smoker -- 1.00 packs/day  . Smokeless tobacco: Never Used  . Alcohol Use: No  .  Drug Use: No  . Sexual Activity: Not on file   Other Topics Concern  . Not on file   Social History Narrative   ** Merged History Encounter **       Daughter in [place]) [phone]   Son in [place]   Son in [place]   Retired- Medical sales representative business, worked at [employer] for 28 years   Enjoys dancing and going to [place]   Completed 11th grade   Very poor vision due to macular degeneration          FAMILY HISTORY: Family History  Problem Relation Age of Onset  . Arthritis Mother   . Arthritis-Osteo Mother   . Cancer Sister 18    breast  . Diabetes Sister   . Diabetes Brother   . Suicidality Brother   . Diabetes Brother   . Breast cancer Sister   . Cancer Maternal Aunt     breast cancer     ALLERGIES:  is allergic to aspirin; codeine; morphine and related; and penicillins.  MEDICATIONS:  Current Outpatient Prescriptions  Medication Sig Dispense Refill  . acetaminophen (TYLENOL) 500 MG tablet Take 1,000 mg by mouth every 6 (six) hours as needed. For pain    . CALCIUM-MAGNESIUM-ZINC PO Take 1 tablet by mouth daily. Reported on 06/02/2015    . Cyanocobalamin (VITAMIN B-12 PO) Take 1 tablet by mouth daily. Reported on 06/02/2015    . dexamethasone (DECADRON) 4 MG tablet Take 1 tablet (4 mg total) by mouth daily. 10 tablet 0  . dimenhyDRINATE (DRAMAMINE) 50 MG tablet Take 25 mg by mouth at bedtime as needed for dizziness. Reported on 06/02/2015    . Multiple Vitamin (MULTIVITAMIN) tablet Take 1 tablet by mouth daily. Reported on 06/02/2015    . ondansetron (ZOFRAN) 8 MG tablet Take 1 tablet (8 mg total) by mouth every 8 (eight) hours as needed for nausea. (Patient not taking: Reported on 06/02/2015) 30 tablet 3  . polyethylene glycol (MIRALAX / GLYCOLAX) packet Take 17 g by mouth 2 (two) times daily. Reported on 06/02/2015    . prochlorperazine (COMPAZINE) 5 MG tablet Take 1 tablet (5 mg total) by mouth every 6 (six) hours as needed for  nausea or vomiting. (Patient not taking: Reported on 06/02/2015) 30 tablet 1  . promethazine (PHENERGAN) 25 MG tablet Take 1 tablet (25 mg total) by mouth every 8 (eight) hours as needed for nausea or vomiting. 30 tablet 5  . sennosides-docusate sodium (SENOKOT-S) 8.6-50 MG tablet Take 2 tablets by mouth 2 (two) times daily. Reported on 06/02/2015    . traMADol (ULTRAM) 50 MG tablet Take 0.5 tablets (25 mg total) by mouth every 6 (six) hours as needed. 20 tablet 0   No current facility-administered medications for this visit.    REVIEW OF SYSTEMS:   Constitutional: Denies fevers, chills or abnormal night sweats Eyes: Denies blurriness of vision, double vision or watery eyes Ears,  nose, mouth, throat, and face: Denies mucositis or sore throat Respiratory: Denies cough, dyspnea or wheezes Cardiovascular: Denies palpitation, chest discomfort or lower extremity swelling Gastrointestinal:  Denies nausea, heartburn or change in bowel habits Skin: Denies abnormal skin rashes Lymphatics: Denies new lymphadenopathy or easy bruising Neurological:Denies numbness, tingling or new weaknesses Behavioral/Psych: Mood is stable, no new changes  All other systems were reviewed with the patient and are negative.  PHYSICAL EXAMINATION: ECOG PERFORMANCE STATUS: 2 - Symptomatic, <50% confined to bed  Filed Vitals:   07/03/15 1318  BP: 111/45  Pulse: 60  Temp: 98.3 F (36.8 C)  Resp: 18   Filed Weights   07/03/15 1318  Weight: 136 lb 11.2 oz (62.007 kg)    GENERAL:alert, no distress and comfortable SKIN: skin color, texture, turgor are normal, no rashes or significant lesions EYES: normal, conjunctiva are pink and non-injected, sclera clear OROPHARYNX:no exudate, no erythema and lips, buccal mucosa, and tongue normal  NECK: supple, thyroid normal size, non-tender, without nodularity LYMPH:  no palpable lymphadenopathy in the cervical, axillary or inguinal LUNGS: clear to auscultation and percussion  with normal breathing effort HEART: regular rate & rhythm and no murmurs and no lower extremity edema ABDOMEN:abdomen soft, non-tender and normal bowel sounds Musculoskeletal:no cyanosis of digits and no clubbing  PSYCH: alert & oriented x 3 with fluent speech NEURO: no focal motor/sensory deficits  LABORATORY DATA:  I have reviewed the data as listed CBC Latest Ref Rng 07/03/2015 04/21/2015 03/23/2015  WBC 3.9 - 10.3 10e3/uL 5.5 7.3 5.4  Hemoglobin 11.6 - 15.9 g/dL 12.1 12.4 12.5  Hematocrit 34.8 - 46.6 % 37.0 37.3 37.7  Platelets 145 - 400 10e3/uL 294 314.0 174.0    CMP Latest Ref Rng 07/03/2015 04/21/2015 03/23/2015  Glucose 70 - 140 mg/dl 88 102(H) 96  BUN 7.0 - 26.0 mg/dL 13._0 Creatinine 0.6 - 1.1 mg/dL 1.0 1.10 0.97  Sodium 136 - 145 mEq/L 139 141 139  Potassium 3.5 - 5.1 mEq/L 4.3 4.2 4.4  Chloride 96 - 112 mEq/L - 106 106  CO2 22 - 29 mEq/L _1 Calcium 8.4 - 10.4 mg/dL 9.0 9.5 9.3  Total Protein 6.4 - 8.3 g/dL 7.2 7.0 6.9  Total Bilirubin 0.20 - 1.20 mg/dL 0.30 0.3 0.3  Alkaline Phos 40 - 150 U/L 82 73 69  AST 5 - 34 U/L _2 ALT 0 - 55 U/L <_3 PATHOLOGY REPORT  Diagnosis 04/29/2014 1. Colon, biopsy, ascending - TUBULAR ADENOMA. NO HIGH GRADE DYSPLASIA OR MALIGNANCY IDENTIFIED. 2. Colon, biopsy, 35 cm sigmoid mass r/o malignancy - POORLY DIFFERENTIATED INVASIVE ADENOCARCINOMA. 3. Colon, polyp(s), sigmoid - TUBULAR ADENOMA. NO HIGH GRADE DYSPLASIA OR MALIGNANCY IDENTIFIED.   RADIOGRAPHIC STUDIES: I have personally reviewed the radiological images as listed and agreed with the findings in the report. No results found. Colonoscopy 04/30/2015 Dr. Ardis Hughs  1. Three polyps were found, removed, two were sent to pathology (jar 1). 2. 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 This was located in proximal sigmoid colon (35-40cm from anus), was biopsied extensively (jar 2) and then the site was labeled  with submucosal SPOT injection. 3. There was a large polypoid lesion in the distal sigmoid (15-17cm from anus) that was 3.5cm across, fairly soft but clearly too large for endoscopic resection. This was extensively biopsied (jar 3) and also labeled with submcosal injection of SPOT. 4. There were two to three  small sessile satelite polyps located just distal to the distal sigmoid mass (not removed). There were numerous diverticulum in the left colon. The examination was otherwise normal   EGD ENDOSCOPIC IMPRESSION: 01/22/2015 There was mild, pan gastric atrophic changes (atrophic gastritis). This was biopsied and sent to pathology. There was a 2cm hiatal hernia. The examination was otherwise normal  RECOMMENDATIONS: If biopsies are positive for H. pylori, will recommend that you restart you antibiotics and will prophylax with scheduled antinausea medicines.  RECOMMENDATIONS: Await final pathology. MRI/MRCP pending as well (to evaluate the periportal lesion). My office will arrange referral to medical oncology and general surgery. The periportal lesion may be a site of distant metastasis or be a second issue. May require further evaluation after MRI (PET scan vs. EUS).    ASSESSMENT & PLAN:  83 year old female, with past medical history of rheumatoid arthritis, macular degeneration, and squamous cell skin cancer, presented with epigastric pain, nausea and weight loss.  1. Sigmoid colon cancer, cTxNxM1, clinical stage IV with periportal node metastases, poorly differentiated adenocarcinoma, MSI-high  -I previously reviewed her colonoscopy findings and pathology results from the biopsies. -She has no significant regional lymphadenopathy, but has two large necrotic appearing periportal nodes, which are very suspicious for metastasis. -I reviewed her PET scan findings with patient and her family members, which showed hypermetabolic periportal lymph nodes and sigmoid colon mass, no other metastasis. No  other primary tumor was seen on the PET scan, I think the PET portal lymphadenopathy are likely distant metastasis from her colon cancer. -she was scheduled for EUS with periportal node biopsy by Dr. Ardis Hughs but she late on declined the biopsy   -Given her advanced age and limited performance status, she is not a good candidate for systemic chemotherapy. -I discussed the Foundation one genomic test results, which showed MS I high and high tumor mutation burden. This predicts probable good response to , especially PDL-1 antibody Keytruda. I have reviewed the phase 2 clinical trial data which showed 40% objective response rate (NEJM 2015, 2509-2520)  -So I recommended her to cnsider Keytruda therapy, which is intravenous every 3 weeks. -Chemo consent: The benefit and additional side effects, which includes but not limited,fatigue, thyroid dysfunction, diarrhea, interstitial pneumonitis and other endocrine dysfunction,we'll discussed with her and agreed to proceed. -however Chales Abrahams has not been approved by FDA for colorectal cancer, and will try to get a free drug from pharmaceutical company. -I plan to tentatively  Start her on [place] next Wednesday  2. Abdominal pain -continue tramadol as needed. I encouraged her to take to better control her pain  3. Nausea and vomiting -continue Compazine as  needed  4. Fatigue and malnutrition -I encouraged her to take nutrition supplement   Plan: -start Keytruda 1200 mg intravenous every 3 weeks next Wednesday -She will withdraw from [facility] program -I'll see her back in 4 weeks when she come in for second dose [place]  -lab today   All questions were answered. The patient knows to call the clinic with any problems, questions or concerns.  I spent 30 minutes counseling the patient face to face. The total time spent in the appointment was 40 minutes and more than 50% was on counseling.     Truitt Merle, MD 07/03/2015

## 2015-07-03 NOTE — Progress Notes (Signed)
Keytruda--off label via-dr and Charlena Cross- doing merck application for poss asst.

## 2015-07-03 NOTE — Telephone Encounter (Signed)
Gave and printed appt sched and avs for pt for March and April °

## 2015-07-04 LAB — CEA: CEA1: 5.3 ng/mL — AB (ref 0.0–4.7)

## 2015-07-06 ENCOUNTER — Encounter: Payer: Self-pay | Admitting: Hematology

## 2015-07-06 LAB — TSH: TSH: 0.915 m[IU]/L (ref 0.308–3.960)

## 2015-07-06 NOTE — Progress Notes (Signed)
left merck form for dr Burr Medico on keytruda will be used

## 2015-07-07 ENCOUNTER — Encounter: Payer: Self-pay | Admitting: Hematology

## 2015-07-07 ENCOUNTER — Other Ambulatory Visit: Payer: Medicare Other

## 2015-07-07 NOTE — Progress Notes (Signed)
I sent merck --off label forms   [phone]

## 2015-07-08 ENCOUNTER — Telehealth: Payer: Self-pay | Admitting: *Deleted

## 2015-07-08 ENCOUNTER — Ambulatory Visit (HOSPITAL_BASED_OUTPATIENT_CLINIC_OR_DEPARTMENT_OTHER): Payer: Medicare Other

## 2015-07-08 ENCOUNTER — Other Ambulatory Visit (HOSPITAL_BASED_OUTPATIENT_CLINIC_OR_DEPARTMENT_OTHER): Payer: Medicare Other

## 2015-07-08 VITALS — BP 124/51 | HR 62 | Temp 97.1°F | Resp 18

## 2015-07-08 DIAGNOSIS — C186 Malignant neoplasm of descending colon: Secondary | ICD-10-CM

## 2015-07-08 DIAGNOSIS — Z5112 Encounter for antineoplastic immunotherapy: Secondary | ICD-10-CM

## 2015-07-08 LAB — CBC WITH DIFFERENTIAL/PLATELET
BASO%: 1.3 % (ref 0.0–2.0)
BASOS ABS: 0.1 10*3/uL (ref 0.0–0.1)
EOS ABS: 0.2 10*3/uL (ref 0.0–0.5)
EOS%: 3.2 % (ref 0.0–7.0)
HEMATOCRIT: 37.7 % (ref 34.8–46.6)
HEMOGLOBIN: 12.3 g/dL (ref 11.6–15.9)
LYMPH#: 1.7 10*3/uL (ref 0.9–3.3)
LYMPH%: 29.1 % (ref 14.0–49.7)
MCH: 28.1 pg (ref 25.1–34.0)
MCHC: 32.7 g/dL (ref 31.5–36.0)
MCV: 86 fL (ref 79.5–101.0)
MONO#: 0.4 10*3/uL (ref 0.1–0.9)
MONO%: 7.8 % (ref 0.0–14.0)
NEUT%: 58.6 % (ref 38.4–76.8)
NEUTROS ABS: 3.4 10*3/uL (ref 1.5–6.5)
Platelets: 270 10*3/uL (ref 145–400)
RBC: 4.39 10*6/uL (ref 3.70–5.45)
RDW: 14 % (ref 11.2–14.5)
WBC: 5.7 10*3/uL (ref 3.9–10.3)

## 2015-07-08 LAB — COMPREHENSIVE METABOLIC PANEL
ALBUMIN: 3.5 g/dL (ref 3.5–5.0)
ALT: <9 U/L (ref 0–55)
ANION GAP: 9 meq/L (ref 3–11)
AST: 10 U/L (ref 5–34)
Alkaline Phosphatase: 72 U/L (ref 40–150)
BUN: 16.9 mg/dL (ref 7.0–26.0)
CO2: 21 mEq/L — ABNORMAL LOW (ref 22–29)
Calcium: 9.1 mg/dL (ref 8.4–10.4)
Chloride: 110 mEq/L — ABNORMAL HIGH (ref 98–109)
Creatinine: 1 mg/dL (ref 0.6–1.1)
EGFR: 52 mL/min/{1.73_m2} — AB (ref >90)
Glucose: 92 mg/dl (ref 70–140)
POTASSIUM: 4.2 meq/L (ref 3.5–5.1)
SODIUM: 140 meq/L (ref 136–145)
TOTAL PROTEIN: 7.1 g/dL (ref 6.4–8.3)
Total Bilirubin: <0.3 mg/dL (ref 0.20–1.20)

## 2015-07-08 MED ORDER — SODIUM CHLORIDE 0.9 % IV SOLN
Freq: Once | INTRAVENOUS | Status: AC
  Administered 2015-07-08: 13:00:00 via INTRAVENOUS

## 2015-07-08 MED ORDER — SODIUM CHLORIDE 0.9 % IV SOLN
200.0000 mg | Freq: Once | INTRAVENOUS | Status: AC
  Administered 2015-07-08: 200 mg via INTRAVENOUS
  Filled 2015-07-08: qty 8

## 2015-07-08 NOTE — Telephone Encounter (Signed)
Spoke with Tye Maryland, intake @ [facility] to inform her re:  Hospice services will need to be revoked since pt is currently under active chemo treatments as per Dr. Ernestina Penna instructions.   Tye Maryland stated pt has not been enrolled in hospice yet. [facility]   Phone   [phone].

## 2015-07-08 NOTE — Patient Instructions (Addendum)
Pembrolizumab injection What is this medicine? PEMBROLIZUMAB (pem broe liz ue mab) is a monoclonal antibody. It is used to treat melanoma and non-small cell lung cancer. This medicine may be used for other purposes; ask your health care provider or pharmacist if you have questions. What should I tell my health care provider before I take this medicine? They need to know if you have any of these conditions: -diabetes -immune system problems -inflammatory bowel disease -liver disease -lung or breathing disease -lupus -an unusual or allergic reaction to pembrolizumab, other medicines, foods, dyes, or preservatives -pregnant or trying to get pregnant -breast-feeding How should I use this medicine? This medicine is for infusion into a vein. It is given by a health care professional in a hospital or clinic setting. A special MedGuide will be given to you before each treatment. Be sure to read this information carefully each time. Talk to your pediatrician regarding the use of this medicine in children. Special care may be needed. Overdosage: If you think you have taken too much of this medicine contact a poison control center or emergency room at once. NOTE: This medicine is only for you. Do not share this medicine with others. What if I miss a dose? It is important not to miss your dose. Call your doctor or health care professional if you are unable to keep an appointment. What may interact with this medicine? Interactions have not been studied. Give your health care provider a list of all the medicines, herbs, non-prescription drugs, or dietary supplements you use. Also tell them if you smoke, drink alcohol, or use illegal drugs. Some items may interact with your medicine. This list may not describe all possible interactions. Give your health care provider a list of all the medicines, herbs, non-prescription drugs, or dietary supplements you use. Also tell them if you smoke, drink alcohol, or  use illegal drugs. Some items may interact with your medicine. What should I watch for while using this medicine? Your condition will be monitored carefully while you are receiving this medicine. You may need blood work done while you are taking this medicine. Do not become pregnant while taking this medicine or for 4 months after stopping it. Women should inform their doctor if they wish to become pregnant or think they might be pregnant. There is a potential for serious side effects to an unborn child. Talk to your health care professional or pharmacist for more information. Do not breast-feed an infant while taking this medicine or for 4 months after the last dose. What side effects may I notice from receiving this medicine? Side effects that you should report to your doctor or health care professional as soon as possible: -allergic reactions like skin rash, itching or hives, swelling of the face, lips, or tongue -bloody or black, tarry stools -breathing problems -change in the amount of urine -changes in vision -chest pain -chills -dark urine -dizziness or feeling faint or lightheaded -fast or irregular heartbeat -fever -flushing -hair loss -muscle pain -muscle weakness -persistent headache -signs and symptoms of high blood sugar such as dizziness; dry mouth; dry skin; fruity breath; nausea; stomach pain; increased hunger or thirst; increased urination -signs and symptoms of liver injury like dark urine, light-colored stools, loss of appetite, nausea, right upper belly pain, yellowing of the eyes or skin -stomach pain -weight loss Side effects that usually do not require medical attention (Report these to your doctor or health care professional if they continue or are bothersome.):constipation -cough -diarrhea -joint pain -  tiredness This list may not describe all possible side effects. Call your doctor for medical advice about side effects. You may report side effects to FDA at  1-800-FDA-1088. Where should I keep my medicine? This drug is given in a hospital or clinic and will not be stored at home. NOTE: This sheet is a summary. It may not cover all possible information. If you have questions about this medicine, talk to your doctor, pharmacist, or health care provider.    2016, Elsevier/Gold Standard. (2014-06-10 17:24:19)  [facility] Discharge Instructions for Patients Receiving Chemotherapy  Today you received the following chemotherapy agents: Keytruda  To help prevent nausea and vomiting after your treatment, we encourage you to take your nausea medication as prescribed.  If you develop nausea and vomiting that is not controlled by your nausea medication, call the clinic.   BELOW ARE SYMPTOMS THAT SHOULD BE REPORTED IMMEDIATELY:  *FEVER GREATER THAN 100.5 F  *CHILLS WITH OR WITHOUT FEVER  NAUSEA AND VOMITING THAT IS NOT CONTROLLED WITH YOUR NAUSEA MEDICATION  *UNUSUAL SHORTNESS OF BREATH  *UNUSUAL BRUISING OR BLEEDING  TENDERNESS IN MOUTH AND THROAT WITH OR WITHOUT PRESENCE OF ULCERS  *URINARY PROBLEMS  *BOWEL PROBLEMS  UNUSUAL RASH Items with * indicate a potential emergency and should be followed up as soon as possible.  Feel free to call the clinic you have any questions or concerns. The clinic phone number is [phone].  Please show the CHEMO ALERT CARD at check-in to the Emergency Department and triage nurse.    

## 2015-07-09 ENCOUNTER — Emergency Department (HOSPITAL_COMMUNITY)
Admission: EM | Admit: 2015-07-09 | Discharge: 2015-07-09 | Disposition: A | Discharge status: Home / Self Care | Payer: Medicare Other | Attending: Emergency Medicine | Admitting: Emergency Medicine

## 2015-07-09 ENCOUNTER — Emergency Department (HOSPITAL_COMMUNITY): Payer: Medicare Other

## 2015-07-09 ENCOUNTER — Other Ambulatory Visit: Payer: Self-pay

## 2015-07-09 ENCOUNTER — Telehealth: Payer: Self-pay | Admitting: *Deleted

## 2015-07-09 ENCOUNTER — Other Ambulatory Visit: Payer: Self-pay | Admitting: Hematology

## 2015-07-09 ENCOUNTER — Encounter (HOSPITAL_COMMUNITY): Payer: Self-pay

## 2015-07-09 DIAGNOSIS — C189 Malignant neoplasm of colon, unspecified: Secondary | ICD-10-CM | POA: Diagnosis not present

## 2015-07-09 DIAGNOSIS — R0789 Other chest pain: Secondary | ICD-10-CM | POA: Insufficient documentation

## 2015-07-09 DIAGNOSIS — Z85038 Personal history of other malignant neoplasm of large intestine: Secondary | ICD-10-CM | POA: Diagnosis not present

## 2015-07-09 DIAGNOSIS — G893 Neoplasm related pain (acute) (chronic): Secondary | ICD-10-CM

## 2015-07-09 DIAGNOSIS — E46 Unspecified protein-calorie malnutrition: Secondary | ICD-10-CM

## 2015-07-09 DIAGNOSIS — Z8701 Personal history of pneumonia (recurrent): Secondary | ICD-10-CM | POA: Insufficient documentation

## 2015-07-09 DIAGNOSIS — R531 Weakness: Secondary | ICD-10-CM | POA: Diagnosis not present

## 2015-07-09 DIAGNOSIS — F17200 Nicotine dependence, unspecified, uncomplicated: Secondary | ICD-10-CM | POA: Diagnosis not present

## 2015-07-09 DIAGNOSIS — Z8719 Personal history of other diseases of the digestive system: Secondary | ICD-10-CM | POA: Insufficient documentation

## 2015-07-09 DIAGNOSIS — Z8669 Personal history of other diseases of the nervous system and sense organs: Secondary | ICD-10-CM | POA: Diagnosis not present

## 2015-07-09 DIAGNOSIS — D849 Immunodeficiency, unspecified: Secondary | ICD-10-CM | POA: Insufficient documentation

## 2015-07-09 DIAGNOSIS — R079 Chest pain, unspecified: Secondary | ICD-10-CM | POA: Diagnosis present

## 2015-07-09 DIAGNOSIS — Z880 Allergy status to penicillin: Secondary | ICD-10-CM | POA: Diagnosis not present

## 2015-07-09 DIAGNOSIS — R109 Unspecified abdominal pain: Secondary | ICD-10-CM

## 2015-07-09 DIAGNOSIS — Z85828 Personal history of other malignant neoplasm of skin: Secondary | ICD-10-CM | POA: Diagnosis not present

## 2015-07-09 DIAGNOSIS — R110 Nausea: Secondary | ICD-10-CM

## 2015-07-09 DIAGNOSIS — Z79899 Other long term (current) drug therapy: Secondary | ICD-10-CM | POA: Diagnosis not present

## 2015-07-09 DIAGNOSIS — M069 Rheumatoid arthritis, unspecified: Secondary | ICD-10-CM | POA: Insufficient documentation

## 2015-07-09 DIAGNOSIS — M549 Dorsalgia, unspecified: Secondary | ICD-10-CM

## 2015-07-09 LAB — CBC
HEMATOCRIT: 36.7 % (ref 36.0–46.0)
Hemoglobin: 11.7 g/dL — ABNORMAL LOW (ref 12.0–15.0)
MCH: 28.5 pg (ref 26.0–34.0)
MCHC: 31.9 g/dL (ref 30.0–36.0)
MCV: 89.5 fL (ref 78.0–100.0)
Platelets: UNDETERMINED 10*3/uL (ref 150–400)
RBC: 4.1 MIL/uL (ref 3.87–5.11)
RDW: 14 % (ref 11.5–15.5)
WBC: 5.5 10*3/uL (ref 4.0–10.5)

## 2015-07-09 LAB — BASIC METABOLIC PANEL
ANION GAP: 9 (ref 5–15)
BUN: 15 mg/dL (ref 6–20)
CALCIUM: 8.9 mg/dL (ref 8.9–10.3)
CO2: 23 mmol/L (ref 22–32)
CREATININE: 0.91 mg/dL (ref 0.44–1.00)
Chloride: 108 mmol/L (ref 101–111)
GFR calc non Af Amer: 54 mL/min — ABNORMAL LOW (ref >60)
Glucose, Bld: 93 mg/dL (ref 65–99)
Potassium: 4.3 mmol/L (ref 3.5–5.1)
SODIUM: 140 mmol/L (ref 135–145)

## 2015-07-09 LAB — I-STAT TROPONIN, ED: Troponin i, poc: 0 ng/mL (ref 0.00–0.08)

## 2015-07-09 MED ORDER — IOHEXOL 350 MG/ML SOLN
100.0000 mL | Freq: Once | INTRAVENOUS | Status: AC | PRN
  Administered 2015-07-09: 100 mL via INTRAVENOUS

## 2015-07-09 MED ORDER — FENTANYL 12 MCG/HR TD PT72
12.5000 ug | MEDICATED_PATCH | TRANSDERMAL | Status: DC
  Administered 2015-07-09: 12.5 ug via TRANSDERMAL
  Filled 2015-07-09: qty 1

## 2015-07-09 MED ORDER — LORAZEPAM 2 MG/ML IJ SOLN
0.5000 mg | Freq: Once | INTRAMUSCULAR | Status: AC
  Administered 2015-07-09: 0.5 mg via INTRAVENOUS
  Filled 2015-07-09: qty 1

## 2015-07-09 NOTE — ED Provider Notes (Signed)
CSN: [id number]     Arrival date & time 07/09/15  1124 History   First MD Initiated Contact with Patient 07/09/15 1142     Chief Complaint  Patient presents with  . CA Pt   . Chest Pain    HPI  Patient presents with new chest pain, dyspnea. Symptoms began within the past 12 hours. Since onset symptoms of been persistent. Chest pain is left sided, radiating to the jaw, left arm, sore, moderate. Dyspnea is persistent. Patient received her first round of monoclonal antibody oncology therapy yesterday. Patient has metastatic colorectal cancer. The patient denies syncope, new belly pain, confusion, disorientation, but does acknowledge increasing weakness.   Past Medical History  Diagnosis Date  . Pneumonia 04/2012  . Rheumatoid Arthritis   . Macular degeneration   . Complication of anesthesia     "morphine caused severe shakes"  . PONV (postoperative nausea and vomiting)   . GERD (gastroesophageal reflux disease)   . Cancer (HCC)     squamous cell  . Colon carcinoma (HCC)     dx. left Colon cancer,evidence of metastasis   Past Surgical History  Procedure Laterality Date  . Total knee arthroplasty      x 2  . Hemorrhoid surgery    . Cataracts    . Hand surgery      Pt states she had both hands operated on for arthritis.  . Shoulder surgery Left     arthroscopy  . Total knee arthroplasty Right   . Total knee arthroplasty Left   . Hand surgery    . Cataract extraction, bilateral    . Tonsillectomy    . Shoulder arthroscopy Left   . Minor hemorrhoidectomy    . Esophagogastroduodenoscopy N/A 01/22/2015    Procedure: ESOPHAGOGASTRODUODENOSCOPY (EGD);  Surgeon: Milus Banister, MD;  Location: [facility];  Service: Endoscopy;  Laterality: N/A;  . Joint replacement    . Eye surgery      bilateral cataracts  . Colonoscopy with propofol N/A 04/30/2015    Procedure: COLONOSCOPY WITH PROPOFOL;  Surgeon: Milus Banister, MD;  Location: [facility];  Service: Endoscopy;   Laterality: N/A;   Family History  Problem Relation Age of Onset  . Arthritis Mother   . Arthritis-Osteo Mother   . Cancer Sister 8    breast  . Diabetes Sister   . Diabetes Brother   . Suicidality Brother   . Diabetes Brother   . Breast cancer Sister   . Cancer Maternal Aunt     breast cancer    Social History  Substance Use Topics  . Smoking status: Current Every Day Smoker -- 1.00 packs/day  . Smokeless tobacco: Never Used  . Alcohol Use: No   OB History    Gravida Para Term Preterm AB TAB SAB Ectopic Multiple Living   0 0 0 0 0 0 0 0       Review of Systems  Constitutional:       Per HPI, otherwise negative  HENT:       Per HPI, otherwise negative  Respiratory:       Per HPI, otherwise negative  Cardiovascular:       Per HPI, otherwise negative  Gastrointestinal: Negative for vomiting.  Endocrine:       Negative aside from HPI  Genitourinary:       Neg aside from HPI   Musculoskeletal:       Per HPI, otherwise negative  Skin: Negative.   Allergic/Immunologic: Positive for  immunocompromised state.  Neurological: Positive for weakness. Negative for syncope.      Allergies  Aspirin; Codeine; Morphine and related; and Penicillins  Home Medications   Prior to Admission medications   Medication Sig Start Date End Date Taking? Authorizing Provider  acetaminophen (TYLENOL) 500 MG tablet Take 1,000 mg by mouth every 6 (six) hours as needed. For pain   Yes Historical Provider, MD  Bisacodyl (DULCOLAX PO) Take 1 tablet by mouth daily as needed (constipation.).   Yes Historical Provider, MD  CALCIUM-MAGNESIUM-ZINC PO Take 1 tablet by mouth daily. Reported on 06/02/2015   Yes Historical Provider, MD  Cyanocobalamin (VITAMIN B-12 PO) Take 1 tablet by mouth daily. Reported on 06/02/2015   Yes Historical Provider, MD  dimenhyDRINATE (DRAMAMINE) 50 MG tablet Take 25 mg by mouth at bedtime as needed for dizziness. Reported on 06/02/2015   Yes Historical Provider, MD   Multiple Vitamin (MULTIVITAMIN) tablet Take 1 tablet by mouth daily. Reported on 06/02/2015   Yes Historical Provider, MD  ondansetron (ZOFRAN) 8 MG tablet Take 1 tablet (8 mg total) by mouth every 8 (eight) hours as needed for nausea. 05/18/15  Yes Truitt Merle, MD  PRESCRIPTION MEDICATION    Yes Historical Provider, MD  traMADol (ULTRAM) 50 MG tablet Take 0.5 tablets (25 mg total) by mouth every 6 (six) hours as needed. Patient taking differently: Take 25 mg by mouth every 6 (six) hours as needed (pain.).  06/25/15  Yes Susanne Borders, NP  dexamethasone (DECADRON) 4 MG tablet Take 1 tablet (4 mg total) by mouth daily. Patient not taking: Reported on 07/09/2015 06/02/15   Truitt Merle, MD  prochlorperazine (COMPAZINE) 5 MG tablet Take 1 tablet (5 mg total) by mouth every 6 (six) hours as needed for nausea or vomiting. Patient not taking: Reported on 07/09/2015 06/02/15   Truitt Merle, MD  promethazine (PHENERGAN) 25 MG tablet Take 1 tablet (25 mg total) by mouth every 8 (eight) hours as needed for nausea or vomiting. Patient not taking: Reported on 07/09/2015 04/21/15   [name], MD   BP 95/54 mmHg  Pulse 62  Temp(Src) 97.6 F (36.4 C) (Oral)  Resp 19  SpO2 94% Physical Exam  Constitutional: She is oriented to person, place, and time. She has a sickly appearance. No distress.  HENT:  Head: Normocephalic and atraumatic.  Eyes: Conjunctivae and EOM are normal.  Cardiovascular: Normal rate and regular rhythm.   Pulmonary/Chest: Effort normal and breath sounds normal. No stridor. No respiratory distress.  Abdominal: She exhibits no distension.  Musculoskeletal: She exhibits no edema.  Neurological: She is alert and oriented to person, place, and time. No cranial nerve deficit.  Skin: Skin is warm and dry.  Psychiatric: She has a normal mood and affect.  Nursing note and vitals reviewed.   ED Course  Procedures (including critical care time) Labs Review Labs Reviewed  BASIC METABOLIC PANEL -  Abnormal; Notable for the following:    GFR calc non Af Amer 54 (*)    All other components within normal limits  CBC - Abnormal; Notable for the following:    Hemoglobin 11.7 (*)    All other components within normal limits  I-STAT TROPOININ, ED    Imaging Review Dg Chest 2 View  07/09/2015  CLINICAL DATA:  Generalized chest pain for 2 weeks. EXAM: CHEST  2 VIEW COMPARISON:  November 16, 2014 FINDINGS: The heart size and mediastinal contours are within normal limits. Both lungs are clear. The visualized skeletal structures  are unremarkable. IMPRESSION: No active cardiopulmonary disease. Electronically Signed   By: Dorise Bullion III M.D   On: 07/09/2015 13:01   Ct Angio Chest Pe W/cm &/or Wo Cm  07/09/2015  CLINICAL DATA:  Generalized chest pain for 2 weeks. EXAM: CT ANGIOGRAPHY CHEST WITH CONTRAST TECHNIQUE: Multidetector CT imaging of the chest was performed using the standard protocol during bolus administration of intravenous contrast. Multiplanar CT image reconstructions and MIPs were obtained to evaluate the vascular anatomy. CONTRAST:  144mL OMNIPAQUE IOHEXOL 350 MG/ML SOLN COMPARISON:  May 11, 2015 FINDINGS: The central airways are within normal limits. Emphysematous changes seen in the lungs. Dependent atelectasis is seen. No suspicious pulmonary nodules, masses, or focal infiltrates. No effusions. No adenopathy identified. There is a small node anterior to the hemi azygous on the right on series 4, image 72 measuring 8 mm in short axis, not seen previously. The thoracic aorta is unchanged. Coronary artery calcifications are seen. The heart is stable. No pleural or pericardial effusions. The necrotic nodes in the upper abdomen on the previous study were not included on today's study. Anterior wedging of a mid thoracic vertebral body is stable. The bones are otherwise unchanged. No evidence of new metastatic disease. Review of the MIP images confirms the above findings. IMPRESSION: 1. No  pulmonary emboli. 2. No acute abnormalities. There is an 8 mm node anterior to the hemi azygous as described above, not seen previously. Recommend attention on follow-up. Electronically Signed   By: Dorise Bullion III M.D   On: 07/09/2015 14:18   I have personally reviewed and evaluated these images and lab results as part of my medical decision-making.  EKG with sinus rhythm, rate 60, right bundle branch block Abnormal  After the initial evaluation the patient's chart, including oncology notes from today, and the last week. Patient two-week for conditional chemotherapeutic agents, has had contact with hospice already, but the family is interested in pursuing therapeutic efforts with Keytruda  2:53 PM Patient continues to have some discomfort. Labs, CT findings discussed the patient and her family members.  On repeat exam the patient remains generally weak appearing, but otherwise pain has improved. I have discussed patient's case with her oncologist.  Patient's oncologist has seen her, will follow-up in the office.  I had a lengthy conversation with patient's daughter about medications the patient was tolerant of, has adverse reactions to. Patient is allergic to morphine, hydrocodone, not sentinel. With concern for pain secondary to cancer, patient will try fentanyl patch, follow-up in oncology office. Daughter notes risks and benefits of this attempt at pain management.  MDM  Patient with metastatic colorectal cancer presents with concern for left-sided chest pain. Given the active malignancy, and the patient's discomfort, as well as dyspnea, CT scan performed. This did not demonstrate new pulmonary embolism, but there is some identification of a pulmonary nodule.  Patient's description of pain that has been present for at least 12 hours med reassuring troponin, EKG are reassuring for the low suspicion of ongoing ACS.      Carmin Muskrat, MD 07/09/15 [phone]

## 2015-07-09 NOTE — Discharge Instructions (Signed)
As discussed, your evaluation today has been largely reassuring.  But, it is important that you monitor your condition carefully, and do not hesitate to return to the ED if you develop new, or concerning changes in your condition. ? ?Otherwise, please follow-up with your physician for appropriate ongoing care. ? ?

## 2015-07-09 NOTE — ED Notes (Addendum)
Pt c/o intermittent generalized chest pain x 2 weeks w/ radiation into L jaw/shoulder/arm starting last night.  Pain score 8/10.  Pt reports taking prescribed pain medication w/ "a little relief."  Pt had first round of Keytruda(chemo) yesterday.  Hx of colon CA w/ mets.  +SOB +Nausea +Lightheadedness

## 2015-07-09 NOTE — Telephone Encounter (Addendum)
11:50 am this nurse spoke to provider reporting phone call.  Verbal order received and read back for patient to be seen by [facility].  Called no answer.  Patient station reveals has arrived in ED.

## 2015-07-09 NOTE — Progress Notes (Signed)
Joann Herrera   DOB:06/14/1923   [id number]   [id number]  Subjective: Pt is well-known to me,  And started [facility] for  Her metastatic colon cancer yesterday. She tolerated the infusion very well,  Without any infusion reactions.  She started having left-sided chest pain , which radiates to left shoulder arm and jaw last night,  Persistent, go worse early this morning, and came to [facility] emergency room. She also complains back pain. She states she had a similar episodes in the past , average once a month, but deceptive is worse and is longer than usual. No fever or chills, no  Skin rash or other new symptoms.   Objective:  Filed Vitals:   07/09/15 1600 07/09/15 1638  BP: 104/58 109/62  Pulse: 62 61  Temp:  98.3 F (36.8 C)  Resp: 19 17    There is no weight on file to calculate BMI. No intake or output data in the 24 hours ending 07/09/15 1646   Sclerae unicteric  Oropharynx clear  No peripheral adenopathy  Lungs clear -- no rales or rhonchi  Heart regular rate and rhythm  Abdomen benign  MSK no focal spinal tenderness, no peripheral edema, mild tenderness in the left chest area,  No skin change  Neuro nonfocal   CBG (last 3)  No results for input(s): GLUCAP in the last 72 hours.   Labs:  Lab Results  Component Value Date   WBC 5.5 07/09/2015   HGB 11.7* 07/09/2015   HCT 36.7 07/09/2015   MCV 89.5 07/09/2015   PLT PLATELET CLUMPS NOTED ON SMEAR, UNABLE TO ESTIMATE 07/09/2015   NEUTROABS 3.4 07/08/2015    @LASTCHEMISTRY @  Urine Studies No results for input(s): UHGB, CRYS in the last 72 hours.  Invalid input(s): UACOL, UAPR, USPG, UPH, UTP, UGL, UKET, UBIL, UNIT, UROB, ULEU, UEPI, UWBC, URBC, UBAC, CAST, UCOM, BILUA  Basic Metabolic Panel:  Recent Labs Lab 07/03/15 1502 07/08/15 1052 07/09/15 1201  NA 139 140 140  K 4.3 4.2 4.3  CL  --   --  108  CO2 23 21* 23  GLUCOSE 88 92 93  BUN 13.2 16.9 15  CREATININE 1.0 1.0 0.91  CALCIUM 9.0 9.1 8.9    GFR Estimated Creatinine Clearance: 34.7 mL/min (by C-G formula based on Cr of 0.91). Liver Function Tests:  Recent Labs Lab 07/03/15 1502 07/08/15 1052  AST 10 10  ALT <9 <9  ALKPHOS 82 72  BILITOT 0.30 <0.30  PROT 7.2 7.1  ALBUMIN 3.5 3.5   No results for input(s): LIPASE, AMYLASE in the last 168 hours. No results for input(s): AMMONIA in the last 168 hours. Coagulation profile No results for input(s): INR, PROTIME in the last 168 hours.  CBC:  Recent Labs Lab 07/03/15 1503 07/08/15 1052 07/09/15 1201  WBC 5.5 5.7 5.5  NEUTROABS 2.8 3.4  --   HGB 12.1 12.3 11.7*  HCT 37.0 37.7 36.7  MCV 86.0 86.0 89.5  PLT 294 270 PLATELET CLUMPS NOTED ON SMEAR, UNABLE TO ESTIMATE   Cardiac Enzymes: No results for input(s): CKTOTAL, CKMB, CKMBINDEX, TROPONINI in the last 168 hours. BNP: Invalid input(s): POCBNP CBG: No results for input(s): GLUCAP in the last 168 hours. D-Dimer No results for input(s): DDIMER in the last 72 hours. Hgb A1c No results for input(s): HGBA1C in the last 72 hours. Lipid Profile No results for input(s): CHOL, HDL, LDLCALC, TRIG, CHOLHDL, LDLDIRECT in the last 72 hours. Thyroid function studies No results for input(s): TSH, T4TOTAL,  T3FREE, THYROIDAB in the last 72 hours.  Invalid input(s): FREET3 Anemia work up No results for input(s): VITAMINB12, FOLATE, FERRITIN, TIBC, IRON, RETICCTPCT in the last 72 hours. Microbiology No results found for this or any previous visit (from the past 240 hour(s)).    Studies:  Dg Chest 2 View  07/09/2015  CLINICAL DATA:  Generalized chest pain for 2 weeks. EXAM: CHEST  2 VIEW COMPARISON:  November 16, 2014 FINDINGS: The heart size and mediastinal contours are within normal limits. Both lungs are clear. The visualized skeletal structures are unremarkable. IMPRESSION: No active cardiopulmonary disease. Electronically Signed   By: Dorise Bullion III M.D   On: 07/09/2015 13:01   Ct Angio Chest Pe W/cm &/or Wo  Cm  07/09/2015  CLINICAL DATA:  Generalized chest pain for 2 weeks. EXAM: CT ANGIOGRAPHY CHEST WITH CONTRAST TECHNIQUE: Multidetector CT imaging of the chest was performed using the standard protocol during bolus administration of intravenous contrast. Multiplanar CT image reconstructions and MIPs were obtained to evaluate the vascular anatomy. CONTRAST:  114mL OMNIPAQUE IOHEXOL 350 MG/ML SOLN COMPARISON:  May 11, 2015 FINDINGS: The central airways are within normal limits. Emphysematous changes seen in the lungs. Dependent atelectasis is seen. No suspicious pulmonary nodules, masses, or focal infiltrates. No effusions. No adenopathy identified. There is a small node anterior to the hemi azygous on the right on series 4, image 72 measuring 8 mm in short axis, not seen previously. The thoracic aorta is unchanged. Coronary artery calcifications are seen. The heart is stable. No pleural or pericardial effusions. The necrotic nodes in the upper abdomen on the previous study were not included on today's study. Anterior wedging of a mid thoracic vertebral body is stable. The bones are otherwise unchanged. No evidence of new metastatic disease. Review of the MIP images confirms the above findings. IMPRESSION: 1. No pulmonary emboli. 2. No acute abnormalities. There is an 8 mm node anterior to the hemi azygous as described above, not seen previously. Recommend attention on follow-up. Electronically Signed   By: Dorise Bullion III M.D   On: 07/09/2015 14:18    Assessment: 82 y.o.   1 left side chest pain,  No suspicion for ACS, CT(-) for PE  2.  Metastatic colon cancer, status post first cycle due to yesterday 3.  Abdominal pain and nausea, from metastatic colon cancer 4.  Fatigue and the malnutrition  Plan:  - her workup in the ED was negative for PE, low possibility of acute or chronic syndrome.  The etiology of her left chest pain is uncertain, could be muscular  Pain, Keytruda induced muscular pain is  possible. But she had similar chest pain in the past, I encourage her to follow up with her PCP after discharge, and possible see cardiology  -discussed pain meds, she will continue tramadol  - I'll ask her to call me early next week and let me know how she is doing at home. If needed,  I'll see her back next week. - she is scheduled to return in 3 weeks for next cycle of Keytruda.  -She will  Be discharged home from ED today. I spoke with Dr. Vanita Panda in ED.    Truitt Merle, MD 07/09/2015  4:46 PM

## 2015-07-09 NOTE — Telephone Encounter (Addendum)
Daughter Ivin Booty called.  "My mom received Keytruda treatment yesterday.  She started having pain to her neck, left arm and across her chest yesterday and hurts quite a bit.  She just told me this morning.  She has s.o.b. Anyway and no worse than her normal.  The neck hurts on the left side as well and goes down her back."  Advised to report to ED due to chest pain.

## 2015-07-13 ENCOUNTER — Telehealth: Payer: Self-pay | Admitting: Hematology

## 2015-07-13 NOTE — Telephone Encounter (Signed)
pt called to r/s done....pt ok and aware of new d.t

## 2015-07-16 ENCOUNTER — Other Ambulatory Visit: Payer: Self-pay | Admitting: Hematology

## 2015-07-25 ENCOUNTER — Emergency Department (HOSPITAL_COMMUNITY): Payer: Medicare Other

## 2015-07-25 ENCOUNTER — Encounter (HOSPITAL_COMMUNITY): Payer: Self-pay | Admitting: Emergency Medicine

## 2015-07-25 ENCOUNTER — Emergency Department (HOSPITAL_COMMUNITY)
Admission: EM | Admit: 2015-07-25 | Discharge: 2015-07-25 | Disposition: A | Discharge status: Home / Self Care | Payer: Medicare Other | Attending: Emergency Medicine | Admitting: Emergency Medicine

## 2015-07-25 DIAGNOSIS — J209 Acute bronchitis, unspecified: Secondary | ICD-10-CM | POA: Diagnosis not present

## 2015-07-25 DIAGNOSIS — Z85828 Personal history of other malignant neoplasm of skin: Secondary | ICD-10-CM | POA: Insufficient documentation

## 2015-07-25 DIAGNOSIS — Y9389 Activity, other specified: Secondary | ICD-10-CM | POA: Insufficient documentation

## 2015-07-25 DIAGNOSIS — Z8719 Personal history of other diseases of the digestive system: Secondary | ICD-10-CM | POA: Diagnosis not present

## 2015-07-25 DIAGNOSIS — S0990XA Unspecified injury of head, initial encounter: Secondary | ICD-10-CM | POA: Diagnosis not present

## 2015-07-25 DIAGNOSIS — R05 Cough: Secondary | ICD-10-CM | POA: Diagnosis not present

## 2015-07-25 DIAGNOSIS — Z880 Allergy status to penicillin: Secondary | ICD-10-CM | POA: Diagnosis not present

## 2015-07-25 DIAGNOSIS — M069 Rheumatoid arthritis, unspecified: Secondary | ICD-10-CM | POA: Diagnosis not present

## 2015-07-25 DIAGNOSIS — S4992XA Unspecified injury of left shoulder and upper arm, initial encounter: Secondary | ICD-10-CM | POA: Diagnosis not present

## 2015-07-25 DIAGNOSIS — Z85038 Personal history of other malignant neoplasm of large intestine: Secondary | ICD-10-CM | POA: Diagnosis not present

## 2015-07-25 DIAGNOSIS — Y998 Other external cause status: Secondary | ICD-10-CM | POA: Diagnosis not present

## 2015-07-25 DIAGNOSIS — Y9289 Other specified places as the place of occurrence of the external cause: Secondary | ICD-10-CM | POA: Insufficient documentation

## 2015-07-25 DIAGNOSIS — R110 Nausea: Secondary | ICD-10-CM | POA: Diagnosis not present

## 2015-07-25 DIAGNOSIS — F17200 Nicotine dependence, unspecified, uncomplicated: Secondary | ICD-10-CM | POA: Diagnosis not present

## 2015-07-25 DIAGNOSIS — Z8669 Personal history of other diseases of the nervous system and sense organs: Secondary | ICD-10-CM | POA: Insufficient documentation

## 2015-07-25 DIAGNOSIS — Z8701 Personal history of pneumonia (recurrent): Secondary | ICD-10-CM | POA: Insufficient documentation

## 2015-07-25 DIAGNOSIS — S51812A Laceration without foreign body of left forearm, initial encounter: Secondary | ICD-10-CM | POA: Insufficient documentation

## 2015-07-25 DIAGNOSIS — S3992XA Unspecified injury of lower back, initial encounter: Secondary | ICD-10-CM | POA: Diagnosis not present

## 2015-07-25 DIAGNOSIS — R531 Weakness: Secondary | ICD-10-CM | POA: Diagnosis not present

## 2015-07-25 DIAGNOSIS — M545 Low back pain: Secondary | ICD-10-CM | POA: Diagnosis not present

## 2015-07-25 DIAGNOSIS — S59912A Unspecified injury of left forearm, initial encounter: Secondary | ICD-10-CM | POA: Diagnosis not present

## 2015-07-25 DIAGNOSIS — S199XXA Unspecified injury of neck, initial encounter: Secondary | ICD-10-CM | POA: Diagnosis not present

## 2015-07-25 DIAGNOSIS — W19XXXA Unspecified fall, initial encounter: Secondary | ICD-10-CM

## 2015-07-25 DIAGNOSIS — M25512 Pain in left shoulder: Secondary | ICD-10-CM | POA: Diagnosis not present

## 2015-07-25 DIAGNOSIS — J40 Bronchitis, not specified as acute or chronic: Secondary | ICD-10-CM | POA: Diagnosis not present

## 2015-07-25 DIAGNOSIS — W01198A Fall on same level from slipping, tripping and stumbling with subsequent striking against other object, initial encounter: Secondary | ICD-10-CM | POA: Insufficient documentation

## 2015-07-25 DIAGNOSIS — M79632 Pain in left forearm: Secondary | ICD-10-CM | POA: Diagnosis not present

## 2015-07-25 DIAGNOSIS — R51 Headache: Secondary | ICD-10-CM | POA: Diagnosis not present

## 2015-07-25 DIAGNOSIS — R404 Transient alteration of awareness: Secondary | ICD-10-CM | POA: Diagnosis not present

## 2015-07-25 LAB — URINALYSIS, ROUTINE W REFLEX MICROSCOPIC
BILIRUBIN URINE: NEGATIVE
Glucose, UA: NEGATIVE mg/dL
HGB URINE DIPSTICK: NEGATIVE
KETONES UR: NEGATIVE mg/dL
Leukocytes, UA: NEGATIVE
Nitrite: NEGATIVE
Protein, ur: 30 mg/dL — AB
SPECIFIC GRAVITY, URINE: 1.022 (ref 1.005–1.030)
pH: 5.5 (ref 5.0–8.0)

## 2015-07-25 LAB — URINE MICROSCOPIC-ADD ON: RBC / HPF: NONE SEEN RBC/hpf (ref 0–5)

## 2015-07-25 LAB — CBC WITH DIFFERENTIAL/PLATELET
BASOS PCT: 0 %
Basophils Absolute: 0 10*3/uL (ref 0.0–0.1)
EOS ABS: 0 10*3/uL (ref 0.0–0.7)
Eosinophils Relative: 0 %
HCT: 33.9 % — ABNORMAL LOW (ref 36.0–46.0)
HEMOGLOBIN: 11.1 g/dL — AB (ref 12.0–15.0)
LYMPHS ABS: 1.7 10*3/uL (ref 0.7–4.0)
Lymphocytes Relative: 20 %
MCH: 28.1 pg (ref 26.0–34.0)
MCHC: 32.7 g/dL (ref 30.0–36.0)
MCV: 85.8 fL (ref 78.0–100.0)
MONO ABS: 0.5 10*3/uL (ref 0.1–1.0)
Monocytes Relative: 6 %
NEUTROS ABS: 6.3 10*3/uL (ref 1.7–7.7)
Neutrophils Relative %: 74 %
Platelets: 133 10*3/uL — ABNORMAL LOW (ref 150–400)
RBC: 3.95 MIL/uL (ref 3.87–5.11)
RDW: 14.6 % (ref 11.5–15.5)
WBC: 8.5 10*3/uL (ref 4.0–10.5)

## 2015-07-25 LAB — COMPREHENSIVE METABOLIC PANEL
ALBUMIN: 3.2 g/dL — AB (ref 3.5–5.0)
ALK PHOS: 56 U/L (ref 38–126)
ALT: 9 U/L — AB (ref 14–54)
AST: 14 U/L — ABNORMAL LOW (ref 15–41)
Anion gap: 8 (ref 5–15)
BILIRUBIN TOTAL: 0.4 mg/dL (ref 0.3–1.2)
BUN: 22 mg/dL — AB (ref 6–20)
CALCIUM: 8 mg/dL — AB (ref 8.9–10.3)
CO2: 23 mmol/L (ref 22–32)
CREATININE: 1.03 mg/dL — AB (ref 0.44–1.00)
Chloride: 106 mmol/L (ref 101–111)
GFR calc Af Amer: 54 mL/min — ABNORMAL LOW (ref >60)
GFR calc non Af Amer: 47 mL/min — ABNORMAL LOW (ref >60)
GLUCOSE: 106 mg/dL — AB (ref 65–99)
Potassium: 4 mmol/L (ref 3.5–5.1)
SODIUM: 137 mmol/L (ref 135–145)
TOTAL PROTEIN: 6.4 g/dL — AB (ref 6.5–8.1)

## 2015-07-25 LAB — CK: CK TOTAL: 51 U/L (ref 38–234)

## 2015-07-25 MED ORDER — DOXYCYCLINE HYCLATE 100 MG PO CAPS: 100.0000 mg | ORAL_CAPSULE | Freq: Two times a day (BID) | ORAL | Status: DC

## 2015-07-25 MED ORDER — SODIUM CHLORIDE 0.9 % IV BOLUS (SEPSIS)
500.0000 mL | Freq: Once | INTRAVENOUS | Status: AC
  Administered 2015-07-25: 500 mL via INTRAVENOUS

## 2015-07-25 NOTE — ED Notes (Signed)
Pt c/o headache, L arm pain, and pain "all over back." Pt reports 8/10 pain for all c/o's. Pt is A&O and in NAD. Pt is a colon CA pt whose last tx was approx 2 weeks ago.

## 2015-07-25 NOTE — Discharge Instructions (Signed)
Upper Respiratory Infection, Adult Most upper respiratory infections (URIs) are a viral infection of the air passages leading to the lungs. A URI affects the nose, throat, and upper air passages. The most common type of URI is nasopharyngitis and is typically referred to as "the common cold." URIs run their course and usually go away on their own. Most of the time, a URI does not require medical attention, but sometimes a bacterial infection in the upper airways can follow a viral infection. This is called a secondary infection. Sinus and middle ear infections are common types of secondary upper respiratory infections. Bacterial pneumonia can also complicate a URI. A URI can worsen asthma and chronic obstructive pulmonary disease (COPD). Sometimes, these complications can require emergency medical care and may be life threatening.  CAUSES Almost all URIs are caused by viruses. A virus is a type of germ and can spread from one person to another.  RISKS FACTORS You may be at risk for a URI if:   You smoke.   You have chronic heart or lung disease.  You have a weakened defense (immune) system.   You are very young or very old.   You have nasal allergies or asthma.  You work in crowded or poorly ventilated areas.  You work in health care facilities or schools. SIGNS AND SYMPTOMS  Symptoms typically develop 2-3 days after you come in contact with a cold virus. Most viral URIs last 7-10 days. However, viral URIs from the influenza virus (flu virus) can last 14-18 days and are typically more severe. Symptoms may include:   Runny or stuffy (congested) nose.   Sneezing.   Cough.   Sore throat.   Headache.   Fatigue.   Fever.   Loss of appetite.   Pain in your forehead, behind your eyes, and over your cheekbones (sinus pain).  Muscle aches.  DIAGNOSIS  Your health care provider may diagnose a URI by:  Physical exam.  Tests to check that your symptoms are not due to  another condition such as:  Strep throat.  Sinusitis.  Pneumonia.  Asthma. TREATMENT  A URI goes away on its own with time. It cannot be cured with medicines, but medicines may be prescribed or recommended to relieve symptoms. Medicines may help:  Reduce your fever.  Reduce your cough.  Relieve nasal congestion. HOME CARE INSTRUCTIONS   Take medicines only as directed by your health care provider.   Gargle warm saltwater or take cough drops to comfort your throat as directed by your health care provider.  Use a warm mist humidifier or inhale steam from a shower to increase air moisture. This may make it easier to breathe.  Drink enough fluid to keep your urine clear or pale yellow.   Eat soups and other clear broths and maintain good nutrition.   Rest as needed.   Return to work when your temperature has returned to normal or as your health care provider advises. You may need to stay home longer to avoid infecting others. You can also use a face mask and careful hand washing to prevent spread of the virus.  Increase the usage of your inhaler if you have asthma.   Do not use any tobacco products, including cigarettes, chewing tobacco, or electronic cigarettes. If you need help quitting, ask your health care provider. PREVENTION  The best way to protect yourself from getting a cold is to practice good hygiene.   Avoid oral or hand contact with people with cold   symptoms.   Wash your hands often if contact occurs.  There is no clear evidence that vitamin C, vitamin E, echinacea, or exercise reduces the chance of developing a cold. However, it is always recommended to get plenty of rest, exercise, and practice good nutrition.  SEEK MEDICAL CARE IF:   You are getting worse rather than better.   Your symptoms are not controlled by medicine.   You have chills.  You have worsening shortness of breath.  You have brown or red mucus.  You have yellow or brown nasal  discharge.  You have pain in your face, especially when you bend forward.  You have a fever.  You have swollen neck glands.  You have pain while swallowing.  You have white areas in the back of your throat. SEEK IMMEDIATE MEDICAL CARE IF:   You have severe or persistent:  Headache.  Ear pain.  Sinus pain.  Chest pain.  You have chronic lung disease and any of the following:  Wheezing.  Prolonged cough.  Coughing up blood.  A change in your usual mucus.  You have a stiff neck.  You have changes in your:  Vision.  Hearing.  Thinking.  Mood. MAKE SURE YOU:   Understand these instructions.  Will watch your condition.  Will get help right away if you are not doing well or get worse.   This information is not intended to replace advice given to you by your health care provider. Make sure you discuss any questions you have with your health care provider.   Document Released: 10/05/2000 Document Revised: 08/26/2014 Document Reviewed: 07/17/2013 Elsevier Interactive Patient Education 2016 [employer]  

## 2015-07-25 NOTE — ED Notes (Addendum)
Pt from home via EMS- Per EMS, pt reports that she fell last pm but was able to get herself up. Pt sts that she was weak. Pt denies head trauma or LOC. Pt c/o L black eye and a skin tear to L forearm and elbow and HA. Pt has hx of chronic hip and back pain. Pt has 20G to LFA. Pt has received 250 ml of NS and 4 Zofran en route. Pt is A&O and in NAD

## 2015-07-25 NOTE — ED Provider Notes (Signed)
CSN: [id number]     Arrival date & time 07/25/15  1229 History   First MD Initiated Contact with Patient 07/25/15 1241     Chief Complaint  Patient presents with  . Fall  . Headache  . Weakness     (Consider location/radiation/quality/duration/timing/severity/associated sxs/prior Treatment) HPI Comments: Patient with a history of metastatic colon cancer presents after a fall. She states that yesterday she was feeling dizzy and nauseated. She states that she's recently had these symptoms recently. She states that she was walking to the bathroom yesterday afternoon and got dizzy and fell. She states that she did hit her head. There was no loss of consciousness. She does complain of a headache as well as neck and back pain. She has a skin tear and some discomfort to her left arm related to the fall. She also has pain in her lower back. She states she laid on the floor all night until her son came and checked on her today. She feels generally weak all over. She denies any known fevers. No vomiting or diarrhea. She does report a recent cough.  She completed her first round of Keytruda on 3/15.  Patient is a 82 y.o. female presenting with fall, headaches, and weakness.  Fall Associated symptoms include headaches. Pertinent negatives include no chest pain, no abdominal pain and no shortness of breath.  Headache Associated symptoms: back pain, dizziness, fatigue, nausea, neck pain and weakness   Associated symptoms: no abdominal pain, no congestion, no cough, no diarrhea, no fever, no numbness and no vomiting   Weakness Associated symptoms include headaches. Pertinent negatives include no chest pain, no abdominal pain and no shortness of breath.    Past Medical History  Diagnosis Date  . Pneumonia 04/2012  . Rheumatoid Arthritis   . Macular degeneration   . Complication of anesthesia     "morphine caused severe shakes"  . PONV (postoperative nausea and vomiting)   . GERD (gastroesophageal  reflux disease)   . Cancer (HCC)     squamous cell  . Colon carcinoma (HCC)     dx. left Colon cancer,evidence of metastasis   Past Surgical History  Procedure Laterality Date  . Total knee arthroplasty      x 2  . Hemorrhoid surgery    . Cataracts    . Hand surgery      Pt states she had both hands operated on for arthritis.  . Shoulder surgery Left     arthroscopy  . Total knee arthroplasty Right   . Total knee arthroplasty Left   . Hand surgery    . Cataract extraction, bilateral    . Tonsillectomy    . Shoulder arthroscopy Left   . Minor hemorrhoidectomy    . Esophagogastroduodenoscopy N/A 01/22/2015    Procedure: ESOPHAGOGASTRODUODENOSCOPY (EGD);  Surgeon: Milus Banister, MD;  Location: [facility];  Service: Endoscopy;  Laterality: N/A;  . Joint replacement    . Eye surgery      bilateral cataracts  . Colonoscopy with propofol N/A 04/30/2015    Procedure: COLONOSCOPY WITH PROPOFOL;  Surgeon: Milus Banister, MD;  Location: [facility];  Service: Endoscopy;  Laterality: N/A;   Family History  Problem Relation Age of Onset  . Arthritis Mother   . Arthritis-Osteo Mother   . Cancer Sister 56    breast  . Diabetes Sister   . Diabetes Brother   . Suicidality Brother   . Diabetes Brother   . Breast cancer Sister   .  Cancer Maternal Aunt     breast cancer    Social History  Substance Use Topics  . Smoking status: Current Every Day Smoker -- 1.00 packs/day  . Smokeless tobacco: Never Used  . Alcohol Use: No   OB History    Gravida Para Term Preterm AB TAB SAB Ectopic Multiple Living   0 0 0 0 0 0 0 0       Review of Systems  Constitutional: Positive for fatigue. Negative for fever, chills and diaphoresis.  HENT: Negative for congestion, rhinorrhea and sneezing.   Eyes: Negative.   Respiratory: Negative for cough, chest tightness and shortness of breath.   Cardiovascular: Negative for chest pain and leg swelling.  Gastrointestinal: Positive for nausea.  Negative for vomiting, abdominal pain, diarrhea and blood in stool.  Genitourinary: Negative for frequency, hematuria, flank pain and difficulty urinating.  Musculoskeletal: Positive for back pain, arthralgias and neck pain.  Skin: Positive for wound. Negative for rash.  Neurological: Positive for dizziness, weakness and headaches. Negative for speech difficulty and numbness.      Allergies  Aspirin; Codeine; Morphine and related; and Penicillins  Home Medications   Prior to Admission medications   Medication Sig Start Date End Date Taking? Authorizing Provider  acetaminophen (TYLENOL) 500 MG tablet Take 1,000 mg by mouth every 6 (six) hours as needed. For pain   Yes Historical Provider, MD  Bisacodyl (DULCOLAX PO) Take 1 tablet by mouth daily as needed (constipation.).   Yes Historical Provider, MD  PRESCRIPTION MEDICATION Reported on 07/25/2015   Yes Historical Provider, MD  promethazine (PHENERGAN) 25 MG tablet TAKE 1 TABLET BY MOUTH EVERY 8 HOURS AS NEEDED FOR NAUSEA AND VOMITING 06/20/15  Yes Historical Provider, MD  traMADol (ULTRAM) 50 MG tablet TAKE 0.5 TABLETS BY MOUTH EVERY 6 HOURS AS NEEDED for pain 06/25/15  Yes Historical Provider, MD  doxycycline (VIBRAMYCIN) 100 MG capsule Take 1 capsule (100 mg total) by mouth 2 (two) times daily. One po bid x 7 days 07/25/15   Malvin Johns, MD  ondansetron (ZOFRAN) 8 MG tablet Take 1 tablet (8 mg total) by mouth every 8 (eight) hours as needed for nausea. Patient not taking: Reported on 07/25/2015 05/18/15   Truitt Merle, MD   BP 102/50 mmHg  Pulse 70  Temp(Src) 98.8 F (37.1 C) (Oral)  Resp 19  SpO2 100% Physical Exam  Constitutional: She is oriented to person, place, and time. She appears well-developed and well-nourished.  HENT:  Head: Normocephalic and atraumatic.  Eyes: Pupils are equal, round, and reactive to light.  Neck:  Positive tenderness throughout the cervical spine. There is no pain to the thoracic spine. There is diffuse  tenderness along the lumbosacral spine. No step-offs or deformities are noted.  Cardiovascular: Normal rate, regular rhythm and normal heart sounds.   Pulmonary/Chest: Effort normal and breath sounds normal. No respiratory distress. She has no wheezes. She has no rales. She exhibits no tenderness.  Abdominal: Soft. Bowel sounds are normal. There is no tenderness. There is no rebound and no guarding.  Musculoskeletal: Normal range of motion. She exhibits no edema.  Patient has a skin tear to her left forearm. There is mild underlying bony tenderness. No pain with palpation or range of motion of the extremities other than some mild pain on range of motion of the left shoulder.  Lymphadenopathy:    She has no cervical adenopathy.  Neurological: She is alert and oriented to person, place, and time.  Patient is weak all  over. She has 4 out of 5 motor strength in all extremities. Sensation is grossly intact to light touch all extremities. No facial drooping. No slurred speech.  Skin: Skin is warm and dry. No rash noted.  Psychiatric: She has a normal mood and affect.    ED Course  Procedures (including critical care time) Labs Review Labs Reviewed  COMPREHENSIVE METABOLIC PANEL - Abnormal; Notable for the following:    Glucose, Bld 106 (*)    BUN 22 (*)    Creatinine, Ser 1.03 (*)    Calcium 8.0 (*)    Total Protein 6.4 (*)    Albumin 3.2 (*)    AST 14 (*)    ALT 9 (*)    GFR calc non Af Amer 47 (*)    GFR calc Af Amer 54 (*)    All other components within normal limits  CBC WITH DIFFERENTIAL/PLATELET - Abnormal; Notable for the following:    Hemoglobin 11.1 (*)    HCT 33.9 (*)    Platelets 133 (*)    All other components within normal limits  URINALYSIS, ROUTINE W REFLEX MICROSCOPIC (NOT AT [facility]) - Abnormal; Notable for the following:    APPearance CLOUDY (*)    Protein, ur 30 (*)    All other components within normal limits  URINE MICROSCOPIC-ADD ON - Abnormal; Notable for the  following:    Squamous Epithelial / LPF 0-5 (*)    Bacteria, UA FEW (*)    All other components within normal limits  CK   Results for orders placed or performed during the hospital encounter of 07/25/15  Comprehensive metabolic panel  Result Value Ref Range   Sodium 137 135 - 145 mmol/L   Potassium 4.0 3.5 - 5.1 mmol/L   Chloride 106 101 - 111 mmol/L   CO2 23 22 - 32 mmol/L   Glucose, Bld 106 (H) 65 - 99 mg/dL   BUN 22 (H) 6 - 20 mg/dL   Creatinine, Ser 1.03 (H) 0.44 - 1.00 mg/dL   Calcium 8.0 (L) 8.9 - 10.3 mg/dL   Total Protein 6.4 (L) 6.5 - 8.1 g/dL   Albumin 3.2 (L) 3.5 - 5.0 g/dL   AST 14 (L) 15 - 41 U/L   ALT 9 (L) 14 - 54 U/L   Alkaline Phosphatase 56 38 - 126 U/L   Total Bilirubin 0.4 0.3 - 1.2 mg/dL   GFR calc non Af Amer 47 (L) >60 mL/min   GFR calc Af Amer 54 (L) >60 mL/min   Anion gap 8 5 - 15  CBC with Differential  Result Value Ref Range   WBC 8.5 4.0 - 10.5 K/uL   RBC 3.95 3.87 - 5.11 MIL/uL   Hemoglobin 11.1 (L) 12.0 - 15.0 g/dL   HCT 33.9 (L) 36.0 - 46.0 %   MCV 85.8 78.0 - 100.0 fL   MCH 28.1 26.0 - 34.0 pg   MCHC 32.7 30.0 - 36.0 g/dL   RDW 14.6 11.5 - 15.5 %   Platelets 133 (L) 150 - 400 K/uL   Neutrophils Relative % 74 %   Lymphocytes Relative 20 %   Monocytes Relative 6 %   Eosinophils Relative 0 %   Basophils Relative 0 %   Neutro Abs 6.3 1.7 - 7.7 K/uL   Lymphs Abs 1.7 0.7 - 4.0 K/uL   Monocytes Absolute 0.5 0.1 - 1.0 K/uL   Eosinophils Absolute 0.0 0.0 - 0.7 K/uL   Basophils Absolute 0.0 0.0 - 0.1 K/uL  Smear Review MORPHOLOGY UNREMARKABLE   Urinalysis, Routine w reflex microscopic  Result Value Ref Range   Color, Urine YELLOW YELLOW   APPearance CLOUDY (A) CLEAR   Specific Gravity, Urine 1.022 1.005 - 1.030   pH 5.5 5.0 - 8.0   Glucose, UA NEGATIVE NEGATIVE mg/dL   Hgb urine dipstick NEGATIVE NEGATIVE   Bilirubin Urine NEGATIVE NEGATIVE   Ketones, ur NEGATIVE NEGATIVE mg/dL   Protein, ur 30 (A) NEGATIVE mg/dL   Nitrite NEGATIVE  NEGATIVE   Leukocytes, UA NEGATIVE NEGATIVE  CK  Result Value Ref Range   Total CK 51 38 - 234 U/L  Urine microscopic-add on  Result Value Ref Range   Squamous Epithelial / LPF 0-5 (A) NONE SEEN   WBC, UA 0-5 0 - 5 WBC/hpf   RBC / HPF NONE SEEN 0 - 5 RBC/hpf   Bacteria, UA FEW (A) NONE SEEN   Dg Chest 2 View  07/25/2015  CLINICAL DATA:  Fall with cough and weakness. EXAM: CHEST  2 VIEW COMPARISON:  07/09/2015 and prior exams FINDINGS: The cardiomediastinal silhouette is unremarkable. Mild peribronchial thickening is unchanged. There is no evidence of focal airspace disease, pulmonary edema, suspicious pulmonary nodule/mass, pleural effusion, or pneumothorax. No acute bony abnormalities are identified. IMPRESSION: No active cardiopulmonary disease. Electronically Signed   By: Margarette Canada M.D.   On: 07/25/2015 14:19   Dg Chest 2 View  07/09/2015  CLINICAL DATA:  Generalized chest pain for 2 weeks. EXAM: CHEST  2 VIEW COMPARISON:  November 16, 2014 FINDINGS: The heart size and mediastinal contours are within normal limits. Both lungs are clear. The visualized skeletal structures are unremarkable. IMPRESSION: No active cardiopulmonary disease. Electronically Signed   By: Dorise Bullion III M.D   On: 07/09/2015 13:01   Dg Lumbar Spine Complete  07/25/2015  CLINICAL DATA:  Chronic low back pain which is worse after fall last night at home. EXAM: LUMBAR SPINE - COMPLETE 4+ VIEW COMPARISON:  CT scan of April 16, 2015. FINDINGS: No fracture is noted. No significant spondylolisthesis is noted. Moderate to severe degenerative disc disease is noted at L2-3, L3-4 and L4-5. Lateral subluxation of L4 on L5 is noted to the left. Atherosclerosis of abdominal aorta is noted. IMPRESSION: Multilevel degenerative disc disease. No acute abnormality seen in the lumbar spine. Electronically Signed   By: Marijo Conception, M.D.   On: 07/25/2015 14:21   Dg Forearm Left  07/25/2015  CLINICAL DATA:  Fall last night with left  shoulder and forearm pain. EXAM: LEFT FOREARM - 2 VIEW COMPARISON:  None. FINDINGS: Exam demonstrates diffuse decreased bone mineralization there are degenerative changes of the radiocarpal joint and carpal bones. Suggestion of previous tripped exam bone resection. No evidence of acute fracture or dislocation. Minimal degenerative change of the elbow joint. IMPRESSION: No acute findings. Electronically Signed   By: Marin Olp M.D.   On: 07/25/2015 14:01   Ct Head Wo Contrast  07/25/2015  CLINICAL DATA:  Cough, weakness, fall and headache. History of metastatic colon cancer EXAM: CT HEAD WITHOUT CONTRAST CT CERVICAL SPINE WITHOUT CONTRAST TECHNIQUE: Multidetector CT imaging of the head and cervical spine was performed following the standard protocol without intravenous contrast. Multiplanar CT image reconstructions of the cervical spine were also generated. COMPARISON:  11/16/2014 FINDINGS: CT HEAD FINDINGS Stable diffuse brain atrophy and chronic white matter microvascular ischemic changes about the ventricles. No acute intracranial hemorrhage, mass lesion, definite infarction, midline shift, herniation, or hydrocephalus. No extra-axial fluid collection. No  focal mass effect or edema. Cisterns are patent. Cerebellar atrophy as well. Orbits are symmetric. Mastoids are clear. Mild scattered mucosal thickening in the paranasal sinuses. Skull appears intact. CT CERVICAL SPINE FINDINGS Normal cervical spine alignment. Bones are osteopenic. Diffuse cervical degenerative disc disease and spondylosis at all levels. Diffuse multilevel facet arthropathy. No subluxation or dislocation. Preserved vertebral body heights. No acute osseous finding or fracture. Normal prevertebral soft tissues. Intact odontoid. No soft tissue asymmetry in the neck. Carotid calcifications evident. Lung apices demonstrate mild emphysema. IMPRESSION: No acute intracranial finding. Mild brain atrophy and chronic white matter microvascular ischemic  changes, unchanged. Cervical degenerative changes and spondylosis without acute osseous finding or malalignment. Bones are osteopenic. Electronically Signed   By: Jerilynn Mages.  Shick M.D.   On: 07/25/2015 14:13   Ct Angio Chest Pe W/cm &/or Wo Cm  07/09/2015  CLINICAL DATA:  Generalized chest pain for 2 weeks. EXAM: CT ANGIOGRAPHY CHEST WITH CONTRAST TECHNIQUE: Multidetector CT imaging of the chest was performed using the standard protocol during bolus administration of intravenous contrast. Multiplanar CT image reconstructions and MIPs were obtained to evaluate the vascular anatomy. CONTRAST:  120mL OMNIPAQUE IOHEXOL 350 MG/ML SOLN COMPARISON:  May 11, 2015 FINDINGS: The central airways are within normal limits. Emphysematous changes seen in the lungs. Dependent atelectasis is seen. No suspicious pulmonary nodules, masses, or focal infiltrates. No effusions. No adenopathy identified. There is a small node anterior to the hemi azygous on the right on series 4, image 72 measuring 8 mm in short axis, not seen previously. The thoracic aorta is unchanged. Coronary artery calcifications are seen. The heart is stable. No pleural or pericardial effusions. The necrotic nodes in the upper abdomen on the previous study were not included on today's study. Anterior wedging of a mid thoracic vertebral body is stable. The bones are otherwise unchanged. No evidence of new metastatic disease. Review of the MIP images confirms the above findings. IMPRESSION: 1. No pulmonary emboli. 2. No acute abnormalities. There is an 8 mm node anterior to the hemi azygous as described above, not seen previously. Recommend attention on follow-up. Electronically Signed   By: Dorise Bullion III M.D   On: 07/09/2015 14:18   Ct Cervical Spine Wo Contrast  07/25/2015  CLINICAL DATA:  Cough, weakness, fall and headache. History of metastatic colon cancer EXAM: CT HEAD WITHOUT CONTRAST CT CERVICAL SPINE WITHOUT CONTRAST TECHNIQUE: Multidetector CT  imaging of the head and cervical spine was performed following the standard protocol without intravenous contrast. Multiplanar CT image reconstructions of the cervical spine were also generated. COMPARISON:  11/16/2014 FINDINGS: CT HEAD FINDINGS Stable diffuse brain atrophy and chronic white matter microvascular ischemic changes about the ventricles. No acute intracranial hemorrhage, mass lesion, definite infarction, midline shift, herniation, or hydrocephalus. No extra-axial fluid collection. No focal mass effect or edema. Cisterns are patent. Cerebellar atrophy as well. Orbits are symmetric. Mastoids are clear. Mild scattered mucosal thickening in the paranasal sinuses. Skull appears intact. CT CERVICAL SPINE FINDINGS Normal cervical spine alignment. Bones are osteopenic. Diffuse cervical degenerative disc disease and spondylosis at all levels. Diffuse multilevel facet arthropathy. No subluxation or dislocation. Preserved vertebral body heights. No acute osseous finding or fracture. Normal prevertebral soft tissues. Intact odontoid. No soft tissue asymmetry in the neck. Carotid calcifications evident. Lung apices demonstrate mild emphysema. IMPRESSION: No acute intracranial finding. Mild brain atrophy and chronic white matter microvascular ischemic changes, unchanged. Cervical degenerative changes and spondylosis without acute osseous finding or malalignment. Bones are osteopenic. Electronically Signed  By: Eugenie Filler M.D.   On: 07/25/2015 14:13   Dg Shoulder Left  07/25/2015  CLINICAL DATA:  Fall last night, left shoulder and forearm pain EXAM: LEFT SHOULDER - 2+ VIEW COMPARISON:  07/25/2015 FINDINGS: Limited two-view exam. No gross malalignment or acute fracture. Degenerative changes of the [facility] joint and glenohumeral joint. No definite soft tissue abnormality. IMPRESSION: Limited two-view exam. Osteopenia and mild degenerative changes. Electronically Signed   By: Jerilynn Mages.  Shick M.D.   On: 07/25/2015 14:01       Imaging Review Dg Chest 2 View  07/25/2015  CLINICAL DATA:  Fall with cough and weakness. EXAM: CHEST  2 VIEW COMPARISON:  07/09/2015 and prior exams FINDINGS: The cardiomediastinal silhouette is unremarkable. Mild peribronchial thickening is unchanged. There is no evidence of focal airspace disease, pulmonary edema, suspicious pulmonary nodule/mass, pleural effusion, or pneumothorax. No acute bony abnormalities are identified. IMPRESSION: No active cardiopulmonary disease. Electronically Signed   By: Margarette Canada M.D.   On: 07/25/2015 14:19   Dg Lumbar Spine Complete  07/25/2015  CLINICAL DATA:  Chronic low back pain which is worse after fall last night at home. EXAM: LUMBAR SPINE - COMPLETE 4+ VIEW COMPARISON:  CT scan of April 16, 2015. FINDINGS: No fracture is noted. No significant spondylolisthesis is noted. Moderate to severe degenerative disc disease is noted at L2-3, L3-4 and L4-5. Lateral subluxation of L4 on L5 is noted to the left. Atherosclerosis of abdominal aorta is noted. IMPRESSION: Multilevel degenerative disc disease. No acute abnormality seen in the lumbar spine. Electronically Signed   By: Marijo Conception, M.D.   On: 07/25/2015 14:21   Dg Forearm Left  07/25/2015  CLINICAL DATA:  Fall last night with left shoulder and forearm pain. EXAM: LEFT FOREARM - 2 VIEW COMPARISON:  None. FINDINGS: Exam demonstrates diffuse decreased bone mineralization there are degenerative changes of the radiocarpal joint and carpal bones. Suggestion of previous tripped exam bone resection. No evidence of acute fracture or dislocation. Minimal degenerative change of the elbow joint. IMPRESSION: No acute findings. Electronically Signed   By: Marin Olp M.D.   On: 07/25/2015 14:01   Ct Head Wo Contrast  07/25/2015  CLINICAL DATA:  Cough, weakness, fall and headache. History of metastatic colon cancer EXAM: CT HEAD WITHOUT CONTRAST CT CERVICAL SPINE WITHOUT CONTRAST TECHNIQUE: Multidetector CT imaging  of the head and cervical spine was performed following the standard protocol without intravenous contrast. Multiplanar CT image reconstructions of the cervical spine were also generated. COMPARISON:  11/16/2014 FINDINGS: CT HEAD FINDINGS Stable diffuse brain atrophy and chronic white matter microvascular ischemic changes about the ventricles. No acute intracranial hemorrhage, mass lesion, definite infarction, midline shift, herniation, or hydrocephalus. No extra-axial fluid collection. No focal mass effect or edema. Cisterns are patent. Cerebellar atrophy as well. Orbits are symmetric. Mastoids are clear. Mild scattered mucosal thickening in the paranasal sinuses. Skull appears intact. CT CERVICAL SPINE FINDINGS Normal cervical spine alignment. Bones are osteopenic. Diffuse cervical degenerative disc disease and spondylosis at all levels. Diffuse multilevel facet arthropathy. No subluxation or dislocation. Preserved vertebral body heights. No acute osseous finding or fracture. Normal prevertebral soft tissues. Intact odontoid. No soft tissue asymmetry in the neck. Carotid calcifications evident. Lung apices demonstrate mild emphysema. IMPRESSION: No acute intracranial finding. Mild brain atrophy and chronic white matter microvascular ischemic changes, unchanged. Cervical degenerative changes and spondylosis without acute osseous finding or malalignment. Bones are osteopenic. Electronically Signed   By: Jerilynn Mages.  Shick M.D.   On: 07/25/2015  14:13   Ct Cervical Spine Wo Contrast  07/25/2015  CLINICAL DATA:  Cough, weakness, fall and headache. History of metastatic colon cancer EXAM: CT HEAD WITHOUT CONTRAST CT CERVICAL SPINE WITHOUT CONTRAST TECHNIQUE: Multidetector CT imaging of the head and cervical spine was performed following the standard protocol without intravenous contrast. Multiplanar CT image reconstructions of the cervical spine were also generated. COMPARISON:  11/16/2014 FINDINGS: CT HEAD FINDINGS Stable  diffuse brain atrophy and chronic white matter microvascular ischemic changes about the ventricles. No acute intracranial hemorrhage, mass lesion, definite infarction, midline shift, herniation, or hydrocephalus. No extra-axial fluid collection. No focal mass effect or edema. Cisterns are patent. Cerebellar atrophy as well. Orbits are symmetric. Mastoids are clear. Mild scattered mucosal thickening in the paranasal sinuses. Skull appears intact. CT CERVICAL SPINE FINDINGS Normal cervical spine alignment. Bones are osteopenic. Diffuse cervical degenerative disc disease and spondylosis at all levels. Diffuse multilevel facet arthropathy. No subluxation or dislocation. Preserved vertebral body heights. No acute osseous finding or fracture. Normal prevertebral soft tissues. Intact odontoid. No soft tissue asymmetry in the neck. Carotid calcifications evident. Lung apices demonstrate mild emphysema. IMPRESSION: No acute intracranial finding. Mild brain atrophy and chronic white matter microvascular ischemic changes, unchanged. Cervical degenerative changes and spondylosis without acute osseous finding or malalignment. Bones are osteopenic. Electronically Signed   By: Jerilynn Mages.  Shick M.D.   On: 07/25/2015 14:13   Dg Shoulder Left  07/25/2015  CLINICAL DATA:  Fall last night, left shoulder and forearm pain EXAM: LEFT SHOULDER - 2+ VIEW COMPARISON:  07/25/2015 FINDINGS: Limited two-view exam. No gross malalignment or acute fracture. Degenerative changes of the [facility] joint and glenohumeral joint. No definite soft tissue abnormality. IMPRESSION: Limited two-view exam. Osteopenia and mild degenerative changes. Electronically Signed   By: Jerilynn Mages.  Shick M.D.   On: 07/25/2015 14:01   I have personally reviewed and evaluated these images and lab results as part of my medical decision-making.   EKG Interpretation   Date/Time:  Saturday July 25 2015 14:19:30 EDT Ventricular Rate:  69 PR Interval:  167 QRS Duration: 137 QT Interval:   434 QTC Calculation: 465 R Axis:   78 Text Interpretation:  Sinus rhythm Right bundle branch block Confirmed by  Jeneen Rinks  MD, Cameron ([id number]) on 07/25/2015 2:23:02 PM      MDM   Final diagnoses:  Bronchitis  Fall, initial encounter    Patient presents after a fall. She reports dizziness and nausea which is similar to her chronic symptoms. She has generalized weakness. Her labs show mildly elevated creatinine but other labs are similar to baseline values. There is no evidence of fractures or traumatic injuries from the fall. Her x-rays and imaging studies are negative. She was given IV fluids here. She's afebrile. She is adamant about not being admitted. She is able to ambulate without assistance. She has borderline low oxygen levels with sats of 91-92. However when she coughs it goes up to 94-95% on room air. Her chest x-ray is clear without evidence of pneumonia. She recently had a CT angio which was negative for PE. She doesn't report any chest pain or shortness of breath. She does have congestion with a recent productive cough. Given her immunocompromised state, I will start her on antibiotics.    Malvin Johns, MD 07/25/15 [phone]

## 2015-07-27 ENCOUNTER — Telehealth: Payer: Self-pay | Admitting: *Deleted

## 2015-07-27 NOTE — Telephone Encounter (Signed)
Son left VM on Saturday on navigators voice mail: Athira fell on 07/25/15 and was taken to emergency room. Was diagnosed with bronchitis. He was instructed to make [name] aware since she is on immunotherapy. He can be reached at [phone] before 3 pm and at [phone] after 3 pm. Message forwarded to MD.

## 2015-07-28 NOTE — Telephone Encounter (Signed)
Late entry:  Talked with pt & son yesterday & pt reports being sick since starting chemo & doesn't want any more treatment.  She has phenergan at home for nausea & son states zofran never filled d/t cost.  Spoke with pharmacy & informed pt & son that zofran 8 mg would be 153.99.  Encouraged pt to come to visit 07/31/15 to discuss plan of treatment with Dr Burr Medico.  Pt doesn't want to come but son states he will plan to get her here.

## 2015-07-29 ENCOUNTER — Ambulatory Visit: Payer: Medicare Other

## 2015-07-29 ENCOUNTER — Other Ambulatory Visit: Payer: Medicare Other

## 2015-07-29 ENCOUNTER — Ambulatory Visit: Payer: Medicare Other | Admitting: Hematology

## 2015-07-31 ENCOUNTER — Encounter: Payer: Self-pay | Admitting: Hematology

## 2015-07-31 ENCOUNTER — Other Ambulatory Visit: Payer: Medicare Other

## 2015-07-31 ENCOUNTER — Other Ambulatory Visit: Payer: Self-pay | Admitting: *Deleted

## 2015-07-31 ENCOUNTER — Ambulatory Visit (HOSPITAL_BASED_OUTPATIENT_CLINIC_OR_DEPARTMENT_OTHER): Payer: Medicare Other | Admitting: Hematology

## 2015-07-31 ENCOUNTER — Telehealth: Payer: Self-pay | Admitting: *Deleted

## 2015-07-31 ENCOUNTER — Telehealth: Payer: Self-pay | Admitting: Hematology

## 2015-07-31 ENCOUNTER — Ambulatory Visit (HOSPITAL_BASED_OUTPATIENT_CLINIC_OR_DEPARTMENT_OTHER): Payer: Medicare Other

## 2015-07-31 ENCOUNTER — Encounter: Payer: Self-pay | Admitting: *Deleted

## 2015-07-31 VITALS — BP 117/43 | HR 61 | Resp 16

## 2015-07-31 VITALS — BP 108/34 | HR 59 | Temp 97.5°F | Resp 18 | Ht 63.0 in

## 2015-07-31 DIAGNOSIS — C186 Malignant neoplasm of descending colon: Secondary | ICD-10-CM

## 2015-07-31 DIAGNOSIS — C187 Malignant neoplasm of sigmoid colon: Secondary | ICD-10-CM

## 2015-07-31 DIAGNOSIS — R109 Unspecified abdominal pain: Secondary | ICD-10-CM | POA: Diagnosis not present

## 2015-07-31 DIAGNOSIS — E46 Unspecified protein-calorie malnutrition: Secondary | ICD-10-CM

## 2015-07-31 DIAGNOSIS — C772 Secondary and unspecified malignant neoplasm of intra-abdominal lymph nodes: Secondary | ICD-10-CM

## 2015-07-31 DIAGNOSIS — IMO0002 Reserved for concepts with insufficient information to code with codable children: Secondary | ICD-10-CM

## 2015-07-31 DIAGNOSIS — C189 Malignant neoplasm of colon, unspecified: Secondary | ICD-10-CM

## 2015-07-31 DIAGNOSIS — R530 Neoplastic (malignant) related fatigue: Secondary | ICD-10-CM | POA: Diagnosis not present

## 2015-07-31 DIAGNOSIS — R112 Nausea with vomiting, unspecified: Secondary | ICD-10-CM | POA: Diagnosis not present

## 2015-07-31 MED ORDER — TRAMADOL HCL 50 MG PO TABS: ORAL_TABLET | ORAL | Status: DC

## 2015-07-31 MED ORDER — PROCHLORPERAZINE MALEATE 10 MG PO TABS: 10.0000 mg | ORAL_TABLET | Freq: Four times a day (QID) | ORAL | Status: DC | PRN

## 2015-07-31 MED ORDER — ONDANSETRON HCL 40 MG/20ML IJ SOLN
Freq: Once | INTRAMUSCULAR | Status: AC
  Administered 2015-07-31: 15:00:00 via INTRAVENOUS
  Filled 2015-07-31: qty 4

## 2015-07-31 MED ORDER — ONDANSETRON HCL 8 MG PO TABS: 8.0000 mg | ORAL_TABLET | Freq: Three times a day (TID) | ORAL | Status: DC | PRN

## 2015-07-31 MED ORDER — OXYCODONE-ACETAMINOPHEN 5-325 MG PO TABS: 1.0000 | ORAL_TABLET | Freq: Once | ORAL | Status: DC

## 2015-07-31 MED ORDER — ACETAMINOPHEN 325 MG PO TABS
650.0000 mg | ORAL_TABLET | Freq: Once | ORAL | Status: AC
  Administered 2015-07-31: 650 mg via ORAL

## 2015-07-31 MED ORDER — SODIUM CHLORIDE 0.9 % IV SOLN
Freq: Once | INTRAVENOUS | Status: AC
  Administered 2015-07-31: 15:00:00 via INTRAVENOUS

## 2015-07-31 MED ORDER — ACETAMINOPHEN 325 MG PO TABS
ORAL_TABLET | ORAL | Status: AC
  Filled 2015-07-31: qty 2

## 2015-07-31 MED FILL — ONDANSETRON ODT 8 MG TABLET: 8 | 10 days supply | Qty: 30 | Fill #0

## 2015-07-31 MED FILL — traMADol HCL 50 MG TABS: 50 | 15 days supply | Qty: 30 | Fill #0

## 2015-07-31 MED FILL — PROCHLORPERAZINE 10 MG TAB: 10 | 7 days supply | Qty: 30 | Fill #0

## 2015-07-31 NOTE — Progress Notes (Signed)
Spoke w/ pt's son about copay assistance.  Informed him that unfortunately there aren't any foundations offering copay assistance for her Dx.  Pt's son was concerned about the cost of her Zofran because it was so expensive at [facility].  I called the [place] to get a price and per the rep it came up to $16.  He was very happy w/ that price so I called [name] & requested she send the Rx to our pharmacy.

## 2015-07-31 NOTE — Patient Instructions (Signed)

## 2015-07-31 NOTE — Progress Notes (Signed)
Oncology Nurse Navigator Documentation  Oncology Nurse Navigator Flowsheets 07/31/2015  Navigator Location [facility]  Navigator Encounter Type Follow-up Appt;3 month  Telephone -  Abnormal Finding Date -  Confirmed Diagnosis Date -  Treatment Initiated Date 07/08/2015  Patient Visit Type MedOnc  Treatment Phase Active Tx-K[place]  Barriers/Navigation Needs Family concerns--unsure she wants to continue treatment; physical decline; lives alone; high copay with Zofran  Education -  Interventions Coordination of Care--encouraged patient to stay for IVF due to hypotension and dehydration  Referrals -  Coordination of Care Other--took patient to infusion area and escorted son to financial advocate re: zofran  Education Method -  Support Groups/Services -  Acuity Level 2  Time Spent with Patient 30  Patient tells navigator she has been in pain and having nausea consistently. Stays home alone-reports she is able to ambulate slowly in the home. [place] She spends most of day in chair or in bed. Occasionally heats things up in her microwave to eat when she feels like eating. One of her sons checks on her daily. She will return win 3 weeks w/her decision.

## 2015-07-31 NOTE — Progress Notes (Signed)
[facility]  Telephone:([phone]) [phone] Fax:(336) [phone]  Clinic Follow up Note   Patient Care Team: [name], MD as PCP - General (Internal Medicine) Truitt Merle, MD as Consulting Physician (Hematology) Erroll Luna, MD as Consulting Physician (General Surgery) Milus Banister, MD as Attending Physician (Gastroenterology) 07/31/2015   CHIEF COMPLAINTS:  Follow up metastatic colon cancer   HISTORY OF PRESENTING ILLNESS(05/18/2015):  Joann Herrera 82 y.o. female is here because of recently diagnosed colon cancer. She is accompanied by her daughter and son to the clinic today.  She has had upper abdominal pain and intermittent nausea and vomiting for almost 2 years, she had multiple ED visit and hospitalization. She describes her pain is located in the epigastric area, usually happens after meals, 5/10 , radiates to back, with associated nausea and vomiting. She has moderate fatigue, she is no energy to do much activity except self cares. She has intermittent dizziness, especially when she stands up. Her appetite is low, she eats small meals. she had intermittent diarrhea for a few years with lossoe BM, but recently developed mild to moderate constipation, she lasot about 15 lbs in the past few years   She was referred to gastroenterologist Dr. Ardis Hughs, and underwent EGD on 01/22/2015, which was unremarkable. Abdominal ultrasound on 03/17/2015 showed gallbladder stone, and a 3.3 cm hypoechoic mass in the portal hepatitis. She subsequently underwent abdominal MRI on 04/30/2015, which showed a 2 cm short axis node in the porta hepatis and additional 1.4 cm port cable node, with central necrosis. She underwent a colonoscopy on 04/30/2015, which showed multiple polyps and a malignant appearing mass in the sigmoid colon, the biopsy confirmed adenocarcinoma. She was seen by surgeon Dr. Brantley Stage.  CURRENT THERAPY: Keytruda 222m iv every 3 weeks started on 07/08/2015, and supportive  care  INTERIM HISTORY: [place] for follow-up. She is accompanied by her son. She developed quite a significant nausea, abdominal pain and poor appetite after her first Keytruda infusion. He was seen in the emergency room. She was discharged afterwards. She has not been feeling well since then, with intermittent right-sided abdominal pain, and nausea, she been using tramadol once a day at night, she was using Phenergan for nausea, but it made her very drowsy. She tried Zofran a few times lately, which helped, but she has very high co-pay for Zofran. She lives alone, [place] She had a fall at home last week, she does not remember what happened. She was seen at the emergency room, and was treated for bronchitis. She came in a wheelchair today.   MEDICAL HISTORY:  Past Medical History  Diagnosis Date  . Pneumonia 04/2012  . Rheumatoid Arthritis   . Macular degeneration   . Complication of anesthesia     "morphine caused severe shakes"  . PONV (postoperative nausea and vomiting)   . GERD (gastroesophageal reflux disease)   . Cancer (HCC)     squamous cell  . Colon carcinoma [place]     dx. left Colon cancer,evidence of metastasis    SURGICAL HISTORY: Past Surgical History  Procedure Laterality Date  . Total knee arthroplasty      x 2  . Hemorrhoid surgery    . Cataracts    . Hand surgery      Pt states she had both hands operated on for arthritis.  . Shoulder surgery Left     arthroscopy  . Total knee arthroplasty Right   . Total knee  arthroplasty Left   . Hand surgery    . Cataract extraction, bilateral    . Tonsillectomy    . Shoulder arthroscopy Left   . Minor hemorrhoidectomy    . Esophagogastroduodenoscopy N/A 01/22/2015    Procedure: ESOPHAGOGASTRODUODENOSCOPY (EGD);  Surgeon: Milus Banister, MD;  Location: [facility];  Service: Endoscopy;  Laterality: N/A;  . Joint replacement    . Eye surgery      bilateral cataracts  . Colonoscopy  with propofol N/A 04/30/2015    Procedure: COLONOSCOPY WITH PROPOFOL;  Surgeon: Milus Banister, MD;  Location: [facility];  Service: Endoscopy;  Laterality: N/A;    SOCIAL HISTORY: Social History   Social History  . Marital Status: Widowed    Spouse Name: N/A  . Number of Children: 3  . Years of Education: N/A   Occupational History  . Retired    Social History Main Topics  . Smoking status: Current Every Day Smoker -- 1.00 packs/day  . Smokeless tobacco: Never Used  . Alcohol Use: No  . Drug Use: No  . Sexual Activity: Not on file   Other Topics Concern  . Not on file   Social History Narrative   ** Merged History Encounter **       Daughter in [place]) [phone]   Son in [place]   Son in [place]   Retired- Medical sales representative business, worked at [employer] for 28 years   Enjoys dancing and going to [place]   Completed 11th grade   Very poor vision due to macular degeneration          FAMILY HISTORY: Family History  Problem Relation Age of Onset  . Arthritis Mother   . Arthritis-Osteo Mother   . Cancer Sister 9    breast  . Diabetes Sister   . Diabetes Brother   . Suicidality Brother   . Diabetes Brother   . Breast cancer Sister   . Cancer Maternal Aunt     breast cancer     ALLERGIES:  is allergic to aspirin; codeine; morphine and related; and penicillins.  MEDICATIONS:  Current Outpatient Prescriptions  Medication Sig Dispense Refill  . acetaminophen (TYLENOL) 500 MG tablet Take 1,000 mg by mouth every 6 (six) hours as needed. For pain    . doxycycline (VIBRAMYCIN) 100 MG capsule Take 1 capsule (100 mg total) by mouth 2 (two) times daily. One po bid x 7 days 14 capsule 0  . ondansetron (ZOFRAN) 8 MG tablet Take 1 tablet (8 mg total) by mouth every 8 (eight) hours as needed for nausea. 30 tablet 3  . PRESCRIPTION MEDICATION Reported on 07/25/2015    . traMADol (ULTRAM) 50 MG tablet TAKE 0.5 TABLETS BY  MOUTH EVERY 6 HOURS AS NEEDED for pain 30 tablet 0  . Bisacodyl (DULCOLAX PO) Take 1 tablet by mouth daily as needed (constipation.).    Marland Kitchen prochlorperazine (COMPAZINE) 10 MG tablet Take 1 tablet (10 mg total) by mouth every 6 (six) hours as needed. 30 tablet 3  . promethazine (PHENERGAN) 25 MG tablet Reported on 07/31/2015  5   No current facility-administered medications for this visit.    REVIEW OF SYSTEMS:   Constitutional: Denies fevers, chills or abnormal night sweats Eyes: Denies blurriness of vision, double vision or watery eyes Ears, nose, mouth, throat, and face: Denies mucositis or sore throat Respiratory: Denies cough, dyspnea or wheezes Cardiovascular: Denies palpitation, chest discomfort or lower extremity swelling Gastrointestinal:  Denies nausea, heartburn or change in bowel habits Skin: Denies abnormal skin rashes Lymphatics: Denies new lymphadenopathy or easy bruising Neurological:Denies numbness, tingling or new weaknesses Behavioral/Psych: Mood is stable, no new changes  All other systems were reviewed with the patient and are negative.  PHYSICAL EXAMINATION: ECOG PERFORMANCE STATUS: 3  Filed Vitals:   07/31/15 1347  BP: 108/34  Pulse: 59  Temp: 97.5 F (36.4 C)  Resp: 18   Filed Weights    GENERAL:alert, no distress and comfortable SKIN: skin color, texture, turgor are normal, no rashes or significant lesions EYES: normal, conjunctiva are pink and non-injected, sclera clear OROPHARYNX:no exudate, no erythema and lips, buccal mucosa, and tongue normal  NECK: supple, thyroid normal size, non-tender, without nodularity LYMPH:  no palpable lymphadenopathy in the cervical, axillary or inguinal LUNGS: clear to auscultation and percussion with normal breathing effort HEART: regular rate & rhythm and no murmurs and no lower extremity edema ABDOMEN:abdomen soft, mild tenderness at RUQ and epigastric area, normal bowel sounds Musculoskeletal:no cyanosis of digits  and no clubbing  PSYCH: alert & oriented x 3 with fluent speech NEURO: no focal motor/sensory deficits  LABORATORY DATA:  I have reviewed the data as listed CBC Latest Ref Rng 07/25/2015 07/09/2015 07/08/2015  WBC 4.0 - 10.5 K/uL 8.5 5.5 5.7  Hemoglobin 12.0 - 15.0 g/dL 11.1(L) 11.7(L) 12.3  Hematocrit 36.0 - 46.0 % 33.9(L) 36.7 37.7  Platelets 150 - 400 K/uL 133(L) PLATELET CLUMPS NOTED ON SMEAR, UNABLE TO ESTIMATE 270    CMP Latest Ref Rng 07/25/2015 07/09/2015 07/08/2015  Glucose 65 - 99 mg/dL 106(H) 93 92  BUN 6 - 20 mg/dL 22(H) 15 16.9  Creatinine 0.44 - 1.00 mg/dL 1.03(H) 0.91 1.0  Sodium 135 - 145 mmol/L 137 140 140  Potassium 3.5 - 5.1 mmol/L 4.0 4.3 4.2  Chloride 101 - 111 mmol/L 106 108 -  CO2 22 - 32 mmol/L 23 23 21(L)  Calcium 8.9 - 10.3 mg/dL 8.0(L) 8.9 9.1  Total Protein 6.5 - 8.1 g/dL 6.4(L) - 7.1  Total Bilirubin 0.3 - 1.2 mg/dL 0.4 - <0.30  Alkaline Phos 38 - 126 U/L 56 - 72  AST 15 - 41 U/L 14(L) - 10  ALT 14 - 54 U/L 9(L) - <9    PATHOLOGY REPORT  Diagnosis 04/29/2014 1. Colon, biopsy, ascending - TUBULAR ADENOMA. NO HIGH GRADE DYSPLASIA OR MALIGNANCY IDENTIFIED. 2. Colon, biopsy, 35 cm sigmoid mass r/o malignancy - POORLY DIFFERENTIATED INVASIVE ADENOCARCINOMA. 3. Colon, polyp(s), sigmoid - TUBULAR ADENOMA. NO HIGH GRADE DYSPLASIA OR MALIGNANCY IDENTIFIED.   RADIOGRAPHIC STUDIES: I have personally reviewed the radiological images as listed and agreed with the findings in the report. Dg Chest 2 View  07/25/2015  CLINICAL DATA:  Fall with cough and weakness. EXAM: CHEST  2 VIEW COMPARISON:  07/09/2015 and prior exams FINDINGS: The cardiomediastinal silhouette is unremarkable. Mild peribronchial thickening is unchanged. There is no evidence of focal airspace disease, pulmonary edema, suspicious pulmonary nodule/mass, pleural effusion, or pneumothorax. No acute bony abnormalities are identified. IMPRESSION: No active cardiopulmonary disease. Electronically Signed   By:  Margarette Canada M.D.   On: 07/25/2015 14:19   Dg Chest 2 View  07/09/2015  CLINICAL DATA:  Generalized chest pain for 2 weeks. EXAM: CHEST  2 VIEW COMPARISON:  November 16, 2014 FINDINGS: The heart size and mediastinal contours are within normal limits. Both lungs are clear. The visualized skeletal structures are unremarkable. IMPRESSION: No active cardiopulmonary disease. Electronically Signed   By: Dorise Bullion  III M.D   On: 07/09/2015 13:01   Dg Lumbar Spine Complete  07/25/2015  CLINICAL DATA:  Chronic low back pain which is worse after fall last night at home. EXAM: LUMBAR SPINE - COMPLETE 4+ VIEW COMPARISON:  CT scan of April 16, 2015. FINDINGS: No fracture is noted. No significant spondylolisthesis is noted. Moderate to severe degenerative disc disease is noted at L2-3, L3-4 and L4-5. Lateral subluxation of L4 on L5 is noted to the left. Atherosclerosis of abdominal aorta is noted. IMPRESSION: Multilevel degenerative disc disease. No acute abnormality seen in the lumbar spine. Electronically Signed   By: Marijo Conception, M.D.   On: 07/25/2015 14:21   Dg Forearm Left  07/25/2015  CLINICAL DATA:  Fall last night with left shoulder and forearm pain. EXAM: LEFT FOREARM - 2 VIEW COMPARISON:  None. FINDINGS: Exam demonstrates diffuse decreased bone mineralization there are degenerative changes of the radiocarpal joint and carpal bones. Suggestion of previous tripped exam bone resection. No evidence of acute fracture or dislocation. Minimal degenerative change of the elbow joint. IMPRESSION: No acute findings. Electronically Signed   By: Marin Olp M.D.   On: 07/25/2015 14:01   Ct Head Wo Contrast  07/25/2015  CLINICAL DATA:  Cough, weakness, fall and headache. History of metastatic colon cancer EXAM: CT HEAD WITHOUT CONTRAST CT CERVICAL SPINE WITHOUT CONTRAST TECHNIQUE: Multidetector CT imaging of the head and cervical spine was performed following the standard protocol without intravenous contrast.  Multiplanar CT image reconstructions of the cervical spine were also generated. COMPARISON:  11/16/2014 FINDINGS: CT HEAD FINDINGS Stable diffuse brain atrophy and chronic white matter microvascular ischemic changes about the ventricles. No acute intracranial hemorrhage, mass lesion, definite infarction, midline shift, herniation, or hydrocephalus. No extra-axial fluid collection. No focal mass effect or edema. Cisterns are patent. Cerebellar atrophy as well. Orbits are symmetric. Mastoids are clear. Mild scattered mucosal thickening in the paranasal sinuses. Skull appears intact. CT CERVICAL SPINE FINDINGS Normal cervical spine alignment. Bones are osteopenic. Diffuse cervical degenerative disc disease and spondylosis at all levels. Diffuse multilevel facet arthropathy. No subluxation or dislocation. Preserved vertebral body heights. No acute osseous finding or fracture. Normal prevertebral soft tissues. Intact odontoid. No soft tissue asymmetry in the neck. Carotid calcifications evident. Lung apices demonstrate mild emphysema. IMPRESSION: No acute intracranial finding. Mild brain atrophy and chronic white matter microvascular ischemic changes, unchanged. Cervical degenerative changes and spondylosis without acute osseous finding or malalignment. Bones are osteopenic. Electronically Signed   By: Jerilynn Mages.  Shick M.D.   On: 07/25/2015 14:13   Ct Angio Chest Pe W/cm &/or Wo Cm  07/09/2015  CLINICAL DATA:  Generalized chest pain for 2 weeks. EXAM: CT ANGIOGRAPHY CHEST WITH CONTRAST TECHNIQUE: Multidetector CT imaging of the chest was performed using the standard protocol during bolus administration of intravenous contrast. Multiplanar CT image reconstructions and MIPs were obtained to evaluate the vascular anatomy. CONTRAST:  121m OMNIPAQUE IOHEXOL 350 MG/ML SOLN COMPARISON:  May 11, 2015 FINDINGS: The central airways are within normal limits. Emphysematous changes seen in the lungs. Dependent atelectasis is seen.  No suspicious pulmonary nodules, masses, or focal infiltrates. No effusions. No adenopathy identified. There is a small node anterior to the hemi azygous on the right on series 4, image 72 measuring 8 mm in short axis, not seen previously. The thoracic aorta is unchanged. Coronary artery calcifications are seen. The heart is stable. No pleural or pericardial effusions. The necrotic nodes in the upper abdomen on the previous study were not  included on today's study. Anterior wedging of a mid thoracic vertebral body is stable. The bones are otherwise unchanged. No evidence of new metastatic disease. Review of the MIP images confirms the above findings. IMPRESSION: 1. No pulmonary emboli. 2. No acute abnormalities. There is an 8 mm node anterior to the hemi azygous as described above, not seen previously. Recommend attention on follow-up. Electronically Signed   By: Dorise Bullion III M.D   On: 07/09/2015 14:18   Ct Cervical Spine Wo Contrast  07/25/2015  CLINICAL DATA:  Cough, weakness, fall and headache. History of metastatic colon cancer EXAM: CT HEAD WITHOUT CONTRAST CT CERVICAL SPINE WITHOUT CONTRAST TECHNIQUE: Multidetector CT imaging of the head and cervical spine was performed following the standard protocol without intravenous contrast. Multiplanar CT image reconstructions of the cervical spine were also generated. COMPARISON:  11/16/2014 FINDINGS: CT HEAD FINDINGS Stable diffuse brain atrophy and chronic white matter microvascular ischemic changes about the ventricles. No acute intracranial hemorrhage, mass lesion, definite infarction, midline shift, herniation, or hydrocephalus. No extra-axial fluid collection. No focal mass effect or edema. Cisterns are patent. Cerebellar atrophy as well. Orbits are symmetric. Mastoids are clear. Mild scattered mucosal thickening in the paranasal sinuses. Skull appears intact. CT CERVICAL SPINE FINDINGS Normal cervical spine alignment. Bones are osteopenic. Diffuse  cervical degenerative disc disease and spondylosis at all levels. Diffuse multilevel facet arthropathy. No subluxation or dislocation. Preserved vertebral body heights. No acute osseous finding or fracture. Normal prevertebral soft tissues. Intact odontoid. No soft tissue asymmetry in the neck. Carotid calcifications evident. Lung apices demonstrate mild emphysema. IMPRESSION: No acute intracranial finding. Mild brain atrophy and chronic white matter microvascular ischemic changes, unchanged. Cervical degenerative changes and spondylosis without acute osseous finding or malalignment. Bones are osteopenic. Electronically Signed   By: Jerilynn Mages.  Shick M.D.   On: 07/25/2015 14:13   Dg Shoulder Left  07/25/2015  CLINICAL DATA:  Fall last night, left shoulder and forearm pain EXAM: LEFT SHOULDER - 2+ VIEW COMPARISON:  07/25/2015 FINDINGS: Limited two-view exam. No gross malalignment or acute fracture. Degenerative changes of the [facility] joint and glenohumeral joint. No definite soft tissue abnormality. IMPRESSION: Limited two-view exam. Osteopenia and mild degenerative changes. Electronically Signed   By: Jerilynn Mages.  Shick M.D.   On: 07/25/2015 14:01   Colonoscopy 04/30/2015 Dr. Ardis Hughs  1. Three polyps were found, removed, two were sent to pathology (jar 1). 2. 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 This was located in proximal sigmoid colon (35-40cm from anus), was biopsied extensively (jar 2) and then the site was labeled with submucosal SPOT injection. 3. There was a large polypoid lesion in the distal sigmoid (15-17cm from anus) that was 3.5cm across, fairly soft but clearly too large for endoscopic resection. This was extensively biopsied (jar 3) and also labeled with submcosal injection of SPOT. 4. There were two to three small sessile satelite polyps located just distal to the distal sigmoid mass (not removed). There were numerous diverticulum in the left colon. The  examination was otherwise normal   EGD ENDOSCOPIC IMPRESSION: 01/22/2015 There was mild, pan gastric atrophic changes (atrophic gastritis). This was biopsied and sent to pathology. There was a 2cm hiatal hernia. The examination was otherwise normal  RECOMMENDATIONS: If biopsies are positive for H. pylori, will recommend that you restart you antibiotics and will prophylax with scheduled antinausea medicines.  RECOMMENDATIONS: Await final pathology. MRI/MRCP pending as well (to evaluate the periportal lesion). My office will arrange referral  to medical oncology and general surgery. The periportal lesion may be a site of distant metastasis or be a second issue. May require further evaluation after MRI (PET scan vs. EUS).    ASSESSMENT & PLAN:  82 year old female, with past medical history of rheumatoid arthritis, macular degeneration, and squamous cell skin cancer, presented with epigastric pain, nausea and weight loss.  1. Sigmoid colon cancer, cTxNxM1, clinical stage IV with periportal node metastases, poorly differentiated adenocarcinoma, MSI-high  -I previously reviewed her colonoscopy findings and pathology results from the biopsies. -She has no significant regional lymphadenopathy, but has two large necrotic appearing periportal nodes, which are very suspicious for metastasis. -I reviewed her PET scan findings with patient and her family members, which showed hypermetabolic periportal lymph nodes and sigmoid colon mass, no other metastasis. No other primary tumor was seen on the PET scan, I think the PET portal lymphadenopathy are likely distant metastasis from her colon cancer. -she was scheduled for EUS with periportal node biopsy by Dr. Ardis Hughs but she late on declined the biopsy   -Given her advanced age and limited performance status, she is not a good candidate for systemic chemotherapy. -I discussed the Foundation one genomic test results, which showed MS I high and high tumor mutation  burden. This predicts probable good response to , especially PDL-1 antibody Keytruda. I have reviewed the phase 2 clinical trial data which showed 40% objective response rate (NEJM 2015, 2509-2520)  -She has started Keytruda 3 weeks ago, but her abdominal pain and nausea got worse. She is reluctant to try again, but wants more time to think about it. I explained to her that immunotherapy may not work immediately, it could take a few months to notice the response -We also discussed palliative care and hospice, she was under hospice care before she started [place], I will re-enrolled her to palliative care survice   2. Abdominal pain -continue tramadol as needed. I encouraged her to take to better control her pain, I refilled for her today   3. Nausea and vomiting -continue Compazine as  Needed, I also given her a prescription of Zofran  4. Fatigue and malnutrition -I encouraged her to take nutrition supplement   Plan: -IVF 2hr with iv zofran today, hold on Keytruda today -RTC in 2 weeks for follow up -I give her a prescription of Compazine, and tramadol -She will see our financial advocate to see if she is qualified for financial assistance for her medication  All questions were answered. The patient knows to call the clinic with any problems, questions or concerns.  I spent 25 minutes counseling the patient face to face. The total time spent in the appointment was 30 minutes and more than 50% was on counseling.     Truitt Merle, MD 07/31/2015

## 2015-07-31 NOTE — Telephone Encounter (Signed)
per pof to sch pt appt-sent MW email to sh trmt-pt aware

## 2015-07-31 NOTE — Telephone Encounter (Signed)
Per staff message and POF I have scheduled appts. Advised scheduler of appts. JMW  

## 2015-08-07 ENCOUNTER — Telehealth: Payer: Self-pay | Admitting: *Deleted

## 2015-08-07 NOTE — Telephone Encounter (Signed)
Oncology Nurse Navigator Documentation  Oncology Nurse Navigator Flowsheets 08/07/2015  Navigator Location [facility]  Navigator Encounter Type Telephone  Telephone Patient Update  Abnormal Finding Date -  Confirmed Diagnosis Date -  Treatment Initiated Date -  Patient Visit Type -  Treatment Phase -  Barriers/Navigation Needs Coordination of Care;Education  Education Pain--  Interventions Education Method;Coordination of Care  Referrals -  Coordination of Care Hospice/Palliative Care  Education Method Verbal  Support Groups/Services -  Acuity Level 1  Time Spent with Patient -  Mannat reports pain not well controlled, but not taking it qid as allowed. Encourage her to take pain med with a snack every 6 hours. She is only eating bites--no appetite, but says she may get about 6 cups of fluids in/day. Her son that lives in [place] checks on her daily. She has not heard from anyone with Palliative Care yet. Attempted to call Hospice/Palliative Care: closed today. Forwarded request to collaborative nurse to follow up on this Monday.

## 2015-08-10 ENCOUNTER — Telehealth: Payer: Self-pay | Admitting: *Deleted

## 2015-08-10 NOTE — Telephone Encounter (Signed)
Received call from [facility] @ [place] re :   Pt is on Active roster for Palliative care in home .  Pt was last seen in Feb, 2017.    Verdis Frederickson has forwarded message from [place], collaborative nurse to  Billey Chang, NP with Palliative Care team for follow up with pt. Maria's   Phone    [phone].

## 2015-08-14 ENCOUNTER — Telehealth: Payer: Self-pay | Admitting: *Deleted

## 2015-08-14 ENCOUNTER — Ambulatory Visit (HOSPITAL_BASED_OUTPATIENT_CLINIC_OR_DEPARTMENT_OTHER): Payer: Medicare Other | Admitting: Hematology

## 2015-08-14 ENCOUNTER — Encounter: Payer: Self-pay | Admitting: Hematology

## 2015-08-14 ENCOUNTER — Other Ambulatory Visit: Payer: Self-pay | Admitting: Hematology

## 2015-08-14 ENCOUNTER — Other Ambulatory Visit (HOSPITAL_BASED_OUTPATIENT_CLINIC_OR_DEPARTMENT_OTHER): Payer: Medicare Other

## 2015-08-14 ENCOUNTER — Ambulatory Visit: Payer: Medicare Other

## 2015-08-14 VITALS — BP 110/54 | HR 63 | Temp 97.6°F | Resp 18 | Ht 63.0 in | Wt 130.7 lb

## 2015-08-14 DIAGNOSIS — E46 Unspecified protein-calorie malnutrition: Secondary | ICD-10-CM | POA: Diagnosis not present

## 2015-08-14 DIAGNOSIS — R109 Unspecified abdominal pain: Secondary | ICD-10-CM | POA: Diagnosis not present

## 2015-08-14 DIAGNOSIS — R530 Neoplastic (malignant) related fatigue: Secondary | ICD-10-CM

## 2015-08-14 DIAGNOSIS — C187 Malignant neoplasm of sigmoid colon: Secondary | ICD-10-CM

## 2015-08-14 DIAGNOSIS — C772 Secondary and unspecified malignant neoplasm of intra-abdominal lymph nodes: Secondary | ICD-10-CM

## 2015-08-14 DIAGNOSIS — C186 Malignant neoplasm of descending colon: Secondary | ICD-10-CM | POA: Diagnosis not present

## 2015-08-14 DIAGNOSIS — R110 Nausea: Secondary | ICD-10-CM

## 2015-08-14 LAB — CBC WITH DIFFERENTIAL/PLATELET
BASO%: 1.3 % (ref 0.0–2.0)
BASOS ABS: 0.1 10*3/uL (ref 0.0–0.1)
EOS ABS: 0.3 10*3/uL (ref 0.0–0.5)
EOS%: 4.7 % (ref 0.0–7.0)
HCT: 37.1 % (ref 34.8–46.6)
HEMOGLOBIN: 12 g/dL (ref 11.6–15.9)
LYMPH%: 34.9 % (ref 14.0–49.7)
MCH: 27.7 pg (ref 25.1–34.0)
MCHC: 32.5 g/dL (ref 31.5–36.0)
MCV: 85.3 fL (ref 79.5–101.0)
MONO#: 0.5 10*3/uL (ref 0.1–0.9)
MONO%: 8.8 % (ref 0.0–14.0)
NEUT%: 50.3 % (ref 38.4–76.8)
NEUTROS ABS: 2.9 10*3/uL (ref 1.5–6.5)
Platelets: 330 10*3/uL (ref 145–400)
RBC: 4.34 10*6/uL (ref 3.70–5.45)
RDW: 15.1 % — AB (ref 11.2–14.5)
WBC: 5.8 10*3/uL (ref 3.9–10.3)
lymph#: 2 10*3/uL (ref 0.9–3.3)

## 2015-08-14 LAB — COMPREHENSIVE METABOLIC PANEL
ANION GAP: 10 meq/L (ref 3–11)
AST: 13 U/L (ref 5–34)
Albumin: 3.5 g/dL (ref 3.5–5.0)
Alkaline Phosphatase: 82 U/L (ref 40–150)
BILIRUBIN TOTAL: 0.38 mg/dL (ref 0.20–1.20)
BUN: 16.7 mg/dL (ref 7.0–26.0)
CALCIUM: 9.1 mg/dL (ref 8.4–10.4)
CO2: 19 mEq/L — ABNORMAL LOW (ref 22–29)
CREATININE: 1.1 mg/dL (ref 0.6–1.1)
Chloride: 109 mEq/L (ref 98–109)
EGFR: 45 mL/min/{1.73_m2} — ABNORMAL LOW (ref >90)
Glucose: 103 mg/dl (ref 70–140)
Potassium: 4.1 mEq/L (ref 3.5–5.1)
Sodium: 138 mEq/L (ref 136–145)
TOTAL PROTEIN: 7.2 g/dL (ref 6.4–8.3)

## 2015-08-14 LAB — TSH: TSH: 0.867 m[IU]/L (ref 0.308–3.960)

## 2015-08-14 MED FILL — ONDANSETRON ODT 8 MG TABLET: 8 | 10 days supply | Qty: 30 | Fill #1

## 2015-08-14 NOTE — Telephone Encounter (Signed)
Call made to [place] spoke with Amy & informed of referral to Hospice.

## 2015-08-14 NOTE — Progress Notes (Signed)
[place]  [phone] [phone]  Clinic Follow up Note   Patient Care Team: [name], MD as PCP - General (Internal Medicine) Truitt Merle, MD as Consulting Physician (Hematology) Erroll Luna, MD as Consulting Physician (General Surgery) Milus Banister, MD as Attending Physician (Gastroenterology) 08/14/2015   CHIEF COMPLAINTS:  Follow up metastatic colon cancer   HISTORY OF PRESENTING ILLNESS(05/18/2015):  Joann Herrera 82 y.o. female is here because of recently diagnosed colon cancer. She is accompanied by her daughter and son to the clinic today.  She has had upper abdominal pain and intermittent nausea and vomiting for almost 2 years, she had multiple ED visit and hospitalization. She describes her pain is located in the epigastric area, usually happens after meals, 5/10 , radiates to back, with associated nausea and vomiting. She has moderate fatigue, she is no energy to do much activity except self cares. She has intermittent dizziness, especially when she stands up. Her appetite is low, she eats small meals. she had intermittent diarrhea for a few years with lossoe BM, but recently developed mild to moderate constipation, she lasot about 15 lbs in the past few years   She was referred to gastroenterologist Dr. Ardis Hughs, and underwent EGD on 01/22/2015, which was unremarkable. Abdominal ultrasound on 03/17/2015 showed gallbladder stone, and a 3.3 cm hypoechoic mass in the portal hepatitis. She subsequently underwent abdominal MRI on 04/30/2015, which showed a 2 cm short axis node in the porta hepatis and additional 1.4 cm port cable node, with central necrosis. She underwent a colonoscopy on 04/30/2015, which showed multiple polyps and a malignant appearing mass in the sigmoid colon, the biopsy confirmed adenocarcinoma. She was seen by surgeon Dr. Brantley Stage.  CURRENT THERAPY: Keytruda '200mg'$  iv every 3 weeks started on 07/08/2015, stopped after  first cycle,  and supportive care  INTERIM HISTORY: Joann Herrera returns for follow-up. She is accompanied by her son. She  Has been feeling poorly lately. She is quite weak,  And he complains of dizziness, gastric pain,  And profound weakness after eating , even with small meal.  She is afraid to eat now because of that. Tramadol works for the pain, but she does not want take before meal due to gastric discomfort from medication. Zofran works for her nausea also, she has been taking 2-3 times a day. She states in wheelchair and bed most of time during day, her two sons stop by to see her on daily bases.    MEDICAL HISTORY:  Past Medical History  Diagnosis Date  . Pneumonia 04/2012  . Rheumatoid Arthritis   . Macular degeneration   . Complication of anesthesia     "morphine caused severe shakes"  . PONV (postoperative nausea and vomiting)   . GERD (gastroesophageal reflux disease)   . Cancer (HCC)     squamous cell  . Colon carcinoma ([place])     dx. left Colon cancer,evidence of metastasis    SURGICAL HISTORY: Past Surgical History  Procedure Laterality Date  . Total knee arthroplasty      x 2  . Hemorrhoid surgery    . Cataracts    . Hand surgery      Pt states she had both hands operated on for arthritis.  . Shoulder surgery Left     arthroscopy  . Total knee arthroplasty Right   . Total knee arthroplasty Left   . Hand surgery    . Cataract extraction, bilateral    . Tonsillectomy    .  Shoulder arthroscopy Left   . Minor hemorrhoidectomy    . Esophagogastroduodenoscopy N/A 01/22/2015    Procedure: ESOPHAGOGASTRODUODENOSCOPY (EGD);  Surgeon: Milus Banister, MD;  Location: [facility];  Service: Endoscopy;  Laterality: N/A;  . Joint replacement    . Eye surgery      bilateral cataracts  . Colonoscopy with propofol N/A 04/30/2015    Procedure: COLONOSCOPY WITH PROPOFOL;  Surgeon: Milus Banister, MD;  Location: [facility];  Service: Endoscopy;  Laterality: N/A;    SOCIAL  HISTORY: Social History   Social History  . Marital Status: Widowed    Spouse Name: N/A  . Number of Children: 3  . Years of Education: N/A   Occupational History  . Retired    Social History Main Topics  . Smoking status: Current Every Day Smoker -- 1.00 packs/day  . Smokeless tobacco: Never Used  . Alcohol Use: No  . Drug Use: No  . Sexual Activity: Not on file   Other Topics Concern  . Not on file   Social History Narrative   ** Merged History Encounter **       Daughter in [place] Ivin Booty [place]) [phone]   Son in [place]   Son in [place]   Retired- Medical sales representative business, worked at [employer] for 28 years   Enjoys dancing and going to [place]   Completed 11th grade   Very poor vision due to macular degeneration          FAMILY HISTORY: Family History  Problem Relation Age of Onset  . Arthritis Mother   . Arthritis-Osteo Mother   . Cancer Sister 3    breast  . Diabetes Sister   . Diabetes Brother   . Suicidality Brother   . Diabetes Brother   . Breast cancer Sister   . Cancer Maternal Aunt     breast cancer     ALLERGIES:  is allergic to aspirin; codeine; morphine and related; and penicillins.  MEDICATIONS:  Current Outpatient Prescriptions  Medication Sig Dispense Refill  . acetaminophen (TYLENOL) 500 MG tablet Take 1,000 mg by mouth every 6 (six) hours as needed. For pain    . Bisacodyl (DULCOLAX PO) Take 1 tablet by mouth daily as needed (constipation.).    Marland Kitchen doxycycline (VIBRAMYCIN) 100 MG capsule Take 1 capsule (100 mg total) by mouth 2 (two) times daily. One po bid x 7 days 14 capsule 0  . ondansetron (ZOFRAN) 8 MG tablet Take 1 tablet (8 mg total) by mouth every 8 (eight) hours as needed for nausea. May fill with ODT if cost not prohibitive. 30 tablet 3  . PRESCRIPTION MEDICATION Reported on 07/25/2015    . prochlorperazine (COMPAZINE) 10 MG tablet Take 1 tablet (10 mg total) by mouth every 6 (six) hours as  needed. 30 tablet 3  . promethazine (PHENERGAN) 25 MG tablet Reported on 07/31/2015  5  . traMADol (ULTRAM) 50 MG tablet TAKE 0.5 TABLETS BY MOUTH EVERY 6 HOURS AS NEEDED for pain 30 tablet 0   No current facility-administered medications for this visit.    REVIEW OF SYSTEMS:   Constitutional: Denies fevers, chills or abnormal night sweats Eyes: Denies blurriness of vision, double vision or watery eyes Ears, nose, mouth, throat, and face: Denies mucositis or sore throat Respiratory: Denies cough, dyspnea or wheezes Cardiovascular: Denies palpitation, chest discomfort or lower extremity swelling Gastrointestinal:  Denies nausea, heartburn or change in bowel habits Skin: Denies abnormal skin rashes Lymphatics: Denies  new lymphadenopathy or easy bruising Neurological:Denies numbness, tingling or new weaknesses Behavioral/Psych: Mood is stable, no new changes  All other systems were reviewed with the patient and are negative.  PHYSICAL EXAMINATION: ECOG PERFORMANCE STATUS: 3  Filed Vitals:   08/14/15 1134  BP: 110/54  Pulse: 63  Temp: 97.6 F (36.4 C)  Resp: 18   Filed Weights   08/14/15 1134  Weight: 130 lb 11.2 oz (59.285 kg)    GENERAL:alert, no distress and comfortable SKIN: skin color, texture, turgor are normal, no rashes or significant lesions EYES: normal, conjunctiva are pink and non-injected, sclera clear OROPHARYNX:no exudate, no erythema and lips, buccal mucosa, and tongue normal  NECK: supple, thyroid normal size, non-tender, without nodularity LYMPH:  no palpable lymphadenopathy in the cervical, axillary or inguinal LUNGS: clear to auscultation and percussion with normal breathing effort HEART: regular rate & rhythm and no murmurs and no lower extremity edema ABDOMEN:abdomen soft, mild tenderness at RUQ and epigastric area, normal bowel sounds Musculoskeletal:no cyanosis of digits and no clubbing  PSYCH: alert & oriented x 3 with fluent speech NEURO: no focal  motor/sensory deficits  LABORATORY DATA:  I have reviewed the data as listed CBC Latest Ref Rng 08/14/2015 07/25/2015 07/09/2015  WBC 3.9 - 10.3 10e3/uL 5.8 8.5 5.5  Hemoglobin 11.6 - 15.9 g/dL 12.0 11.1(L) 11.7(L)  Hematocrit 34.8 - 46.6 % 37.1 33.9(L) 36.7  Platelets 145 - 400 10e3/uL 330 133(L) PLATELET CLUMPS NOTED ON SMEAR, UNABLE TO ESTIMATE    CMP Latest Ref Rng 08/14/2015 07/25/2015 07/09/2015  Glucose 70 - 140 mg/dl 103 106(H) 93  BUN 7.0 - 26.0 mg/dL 16.7 22(H) 15  Creatinine 0.6 - 1.1 mg/dL 1.1 1.03(H) 0.91  Sodium 136 - 145 mEq/L 138 137 140  Potassium 3.5 - 5.1 mEq/L 4.1 4.0 4.3  Chloride 101 - 111 mmol/L - 106 108  CO2 22 - 29 mEq/L 19(L) 23 23  Calcium 8.4 - 10.4 mg/dL 9.1 8.0(L) 8.9  Total Protein 6.4 - 8.3 g/dL 7.2 6.4(L) -  Total Bilirubin 0.20 - 1.20 mg/dL 0.38 0.4 -  Alkaline Phos 40 - 150 U/L 82 56 -  AST 5 - 34 U/L 13 14(L) -  ALT 0 - 55 U/L <9 9(L) -    PATHOLOGY REPORT  Diagnosis 04/29/2014 1. Colon, biopsy, ascending - TUBULAR ADENOMA. NO HIGH GRADE DYSPLASIA OR MALIGNANCY IDENTIFIED. 2. Colon, biopsy, 35 cm sigmoid mass r/o malignancy - POORLY DIFFERENTIATED INVASIVE ADENOCARCINOMA. 3. Colon, polyp(s), sigmoid - TUBULAR ADENOMA. NO HIGH GRADE DYSPLASIA OR MALIGNANCY IDENTIFIED.   RADIOGRAPHIC STUDIES: I have personally reviewed the radiological images as listed and agreed with the findings in the report. Dg Chest 2 View  07/25/2015  CLINICAL DATA:  Fall with cough and weakness. EXAM: CHEST  2 VIEW COMPARISON:  07/09/2015 and prior exams FINDINGS: The cardiomediastinal silhouette is unremarkable. Mild peribronchial thickening is unchanged. There is no evidence of focal airspace disease, pulmonary edema, suspicious pulmonary nodule/mass, pleural effusion, or pneumothorax. No acute bony abnormalities are identified. IMPRESSION: No active cardiopulmonary disease. Electronically Signed   By: Margarette Canada M.D.   On: 07/25/2015 14:19   Dg Lumbar Spine  Complete  07/25/2015  CLINICAL DATA:  Chronic low back pain which is worse after fall last night at home. EXAM: LUMBAR SPINE - COMPLETE 4+ VIEW COMPARISON:  CT scan of April 16, 2015. FINDINGS: No fracture is noted. No significant spondylolisthesis is noted. Moderate to severe degenerative disc disease is noted at L2-3, L3-4 and L4-5. Lateral subluxation  of L4 on L5 is noted to the left. Atherosclerosis of abdominal aorta is noted. IMPRESSION: Multilevel degenerative disc disease. No acute abnormality seen in the lumbar spine. Electronically Signed   By: Lupita Raider, M.D.   On: 07/25/2015 14:21   Dg Forearm Left  07/25/2015  CLINICAL DATA:  Fall last night with left shoulder and forearm pain. EXAM: LEFT FOREARM - 2 VIEW COMPARISON:  None. FINDINGS: Exam demonstrates diffuse decreased bone mineralization there are degenerative changes of the radiocarpal joint and carpal bones. Suggestion of previous tripped exam bone resection. No evidence of acute fracture or dislocation. Minimal degenerative change of the elbow joint. IMPRESSION: No acute findings. Electronically Signed   By: Elberta Fortis M.D.   On: 07/25/2015 14:01   Ct Head Wo Contrast  07/25/2015  CLINICAL DATA:  Cough, weakness, fall and headache. History of metastatic colon cancer EXAM: CT HEAD WITHOUT CONTRAST CT CERVICAL SPINE WITHOUT CONTRAST TECHNIQUE: Multidetector CT imaging of the head and cervical spine was performed following the standard protocol without intravenous contrast. Multiplanar CT image reconstructions of the cervical spine were also generated. COMPARISON:  11/16/2014 FINDINGS: CT HEAD FINDINGS Stable diffuse brain atrophy and chronic white matter microvascular ischemic changes about the ventricles. No acute intracranial hemorrhage, mass lesion, definite infarction, midline shift, herniation, or hydrocephalus. No extra-axial fluid collection. No focal mass effect or edema. Cisterns are patent. Cerebellar atrophy as well. Orbits  are symmetric. Mastoids are clear. Mild scattered mucosal thickening in the paranasal sinuses. Skull appears intact. CT CERVICAL SPINE FINDINGS Normal cervical spine alignment. Bones are osteopenic. Diffuse cervical degenerative disc disease and spondylosis at all levels. Diffuse multilevel facet arthropathy. No subluxation or dislocation. Preserved vertebral body heights. No acute osseous finding or fracture. Normal prevertebral soft tissues. Intact odontoid. No soft tissue asymmetry in the neck. Carotid calcifications evident. Lung apices demonstrate mild emphysema. IMPRESSION: No acute intracranial finding. Mild brain atrophy and chronic white matter microvascular ischemic changes, unchanged. Cervical degenerative changes and spondylosis without acute osseous finding or malalignment. Bones are osteopenic. Electronically Signed   By: Judie Petit.  Shick M.D.   On: 07/25/2015 14:13   Ct Cervical Spine Wo Contrast  07/25/2015  CLINICAL DATA:  Cough, weakness, fall and headache. History of metastatic colon cancer EXAM: CT HEAD WITHOUT CONTRAST CT CERVICAL SPINE WITHOUT CONTRAST TECHNIQUE: Multidetector CT imaging of the head and cervical spine was performed following the standard protocol without intravenous contrast. Multiplanar CT image reconstructions of the cervical spine were also generated. COMPARISON:  11/16/2014 FINDINGS: CT HEAD FINDINGS Stable diffuse brain atrophy and chronic white matter microvascular ischemic changes about the ventricles. No acute intracranial hemorrhage, mass lesion, definite infarction, midline shift, herniation, or hydrocephalus. No extra-axial fluid collection. No focal mass effect or edema. Cisterns are patent. Cerebellar atrophy as well. Orbits are symmetric. Mastoids are clear. Mild scattered mucosal thickening in the paranasal sinuses. Skull appears intact. CT CERVICAL SPINE FINDINGS Normal cervical spine alignment. Bones are osteopenic. Diffuse cervical degenerative disc disease and  spondylosis at all levels. Diffuse multilevel facet arthropathy. No subluxation or dislocation. Preserved vertebral body heights. No acute osseous finding or fracture. Normal prevertebral soft tissues. Intact odontoid. No soft tissue asymmetry in the neck. Carotid calcifications evident. Lung apices demonstrate mild emphysema. IMPRESSION: No acute intracranial finding. Mild brain atrophy and chronic white matter microvascular ischemic changes, unchanged. Cervical degenerative changes and spondylosis without acute osseous finding or malalignment. Bones are osteopenic. Electronically Signed   By: Judie Petit.  Shick M.D.   On: 07/25/2015 14:13  Dg Shoulder Left  07/25/2015  CLINICAL DATA:  Fall last night, left shoulder and forearm pain EXAM: LEFT SHOULDER - 2+ VIEW COMPARISON:  07/25/2015 FINDINGS: Limited two-view exam. No gross malalignment or acute fracture. Degenerative changes of the [facility] joint and glenohumeral joint. No definite soft tissue abnormality. IMPRESSION: Limited two-view exam. Osteopenia and mild degenerative changes. Electronically Signed   By: Jerilynn Mages.  Shick M.D.   On: 07/25/2015 14:01   Colonoscopy 04/30/2015 Dr. Ardis Hughs  1. Three polyps were found, removed, two were sent to pathology (jar 1). 2. 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 This was located in proximal sigmoid colon (35-40cm from anus), was biopsied extensively (jar 2) and then the site was labeled with submucosal SPOT injection. 3. There was a large polypoid lesion in the distal sigmoid (15-17cm from anus) that was 3.5cm across, fairly soft but clearly too large for endoscopic resection. This was extensively biopsied (jar 3) and also labeled with submcosal injection of SPOT. 4. There were two to three small sessile satelite polyps located just distal to the distal sigmoid mass (not removed). There were numerous diverticulum in the left colon. The examination was otherwise normal   EGD  ENDOSCOPIC IMPRESSION: 01/22/2015 There was mild, pan gastric atrophic changes (atrophic gastritis). This was biopsied and sent to pathology. There was a 2cm hiatal hernia. The examination was otherwise normal  RECOMMENDATIONS: If biopsies are positive for H. pylori, will recommend that you restart you antibiotics and will prophylax with scheduled antinausea medicines.  RECOMMENDATIONS: Await final pathology. MRI/MRCP pending as well (to evaluate the periportal lesion). My office will arrange referral to medical oncology and general surgery. The periportal lesion may be a site of distant metastasis or be a second issue. May require further evaluation after MRI (PET scan vs. EUS).    ASSESSMENT & PLAN:  82 year old female, with past medical history of rheumatoid arthritis, macular degeneration, and squamous cell skin cancer, presented with epigastric pain, nausea and weight loss.  1. Sigmoid colon cancer, cTxNxM1, clinical stage IV with periportal node metastases, poorly differentiated adenocarcinoma, MSI-high  -I previously reviewed her colonoscopy findings and pathology results from the biopsies. -She has no significant regional lymphadenopathy, but has two large necrotic appearing periportal nodes, which are very suspicious for metastasis. -I reviewed her PET scan findings with patient and her family members, which showed hypermetabolic periportal lymph nodes and sigmoid colon mass, no other metastasis. No other primary tumor was seen on the PET scan, I think the PET portal lymphadenopathy are likely distant metastasis from her colon cancer. -She has tried [place] treatment once, but had severe abdominal pain, weakness and nausea.  Her overall condition has been getting worse since then. - given the deterioration of her general condition, especially the weakness and pain, I  do not think she is a good candidate for further treatment.  I recommend palliative care, i.e.,  Hospice. I explained the  hospice service to patient and her son,  She lives alone, I think hospice program would be very beneficial for her. After lengthy discussion, she agreed she interval to hospice.  2. Abdominal pain -continue tramadol as needed. I encouraged her to take more to better control her pain. - she refuses neck colleagues, she had bad experience with morphine and codein in the past.  3. Nausea and vomiting -continue Compazine and Zofran as need  4. Fatigue and malnutrition -I encouraged her to take nutrition supplement,  Such as ensure post  Plan: -stop [place] referral,  I'll be happy to remain as her physician when she goes to hospice  All questions were answered. The patient knows to call the clinic with any problems, questions or concerns.  I spent 30 minutes counseling the patient face to face. The total time spent in the appointment was 40 minutes and more than 50% was on counseling.     Truitt Merle, MD 08/14/2015

## 2015-08-15 LAB — CEA: CEA1: 4.4 ng/mL (ref 0.0–4.7)

## 2015-08-18 ENCOUNTER — Telehealth: Payer: Self-pay | Admitting: *Deleted

## 2015-08-18 NOTE — Telephone Encounter (Addendum)
Received call from Dr Hertweck/Hospice asking if Dr Burr Medico planned to give any more immunotherapy.  The pt states that she will receive more & doesn't want Hospice.   Also received call from Hospice RN/Nicole stating that pt wants to remain with palliative care & if any questions to call [phone].   Also received another call from Dr Bradley Ferris stating the same as Elmyra Ricks.   Informed Dr Rosaria Ferries & talked with son, Elta Guadeloupe & he would like Dr Burr Medico to see pt again when sister is in town.  She will be here for [facility] 08/27/15.  POF placed to schedule appt for week of May 8th & call daughter to schedule.

## 2015-08-19 ENCOUNTER — Telehealth: Payer: Self-pay | Admitting: *Deleted

## 2015-08-19 ENCOUNTER — Telehealth: Payer: Self-pay | Admitting: Hematology

## 2015-08-19 NOTE — Telephone Encounter (Signed)
s.w. dtr and advised on 5.8 appt.Marland KitchenMarland KitchenMarland KitchenMarland Kitchenpt ok and aware

## 2015-08-19 NOTE — Telephone Encounter (Signed)
Called & spoke with daughter, Ivin Booty & informed of reason for next appt to discuss treatment/palliative care/ Hospice with pt to that everyone is on the same page.  The daughter agrees & feels good about appt & will discuss some of this with her mother when she comes into town.

## 2015-08-24 ENCOUNTER — Other Ambulatory Visit: Payer: Self-pay | Admitting: Hematology

## 2015-08-24 ENCOUNTER — Telehealth: Payer: Self-pay | Admitting: *Deleted

## 2015-08-24 NOTE — Telephone Encounter (Signed)
I called pt back. Patient states she is little stronger, and wants to try Keytruda again. Her daughter is coming to stay with her on 08/27/2015. And she will come to the appointment on 08/31/2015. Patient states she is not ready for hospice and wants to fight her cancer. I spoke with her son Elta Guadeloupe also, who is concerned about her weakness and her tolerance to treatment. I'll see her on next visit and decide if she would be a candidate for treatment. Infusion appointment will be added on to her visit on 5/8.  Thu, please call hospice Amy and let them know. thanks  Truitt Merle  08/24/2015

## 2015-08-24 NOTE — Telephone Encounter (Signed)
Received call from Amy @ [facility] informing [name] re:   When Hospice nurse came to the home, pt refused Hospice services.  Pt stated she wanted to continue with chemo treatments and would not want Hospice at present.. Amy's   Phone     [phone].

## 2015-08-25 NOTE — Telephone Encounter (Signed)
Called Amy @ [facility] and left message updating her of Dr. Ernestina Penna conversation with pt.

## 2015-08-31 ENCOUNTER — Telehealth: Payer: Self-pay | Admitting: *Deleted

## 2015-08-31 ENCOUNTER — Encounter: Payer: Self-pay | Admitting: Hematology

## 2015-08-31 ENCOUNTER — Ambulatory Visit (HOSPITAL_BASED_OUTPATIENT_CLINIC_OR_DEPARTMENT_OTHER): Payer: Medicare Other | Admitting: Hematology

## 2015-08-31 ENCOUNTER — Ambulatory Visit (HOSPITAL_BASED_OUTPATIENT_CLINIC_OR_DEPARTMENT_OTHER): Payer: Medicare Other

## 2015-08-31 ENCOUNTER — Telehealth: Payer: Self-pay | Admitting: Hematology

## 2015-08-31 ENCOUNTER — Other Ambulatory Visit (HOSPITAL_BASED_OUTPATIENT_CLINIC_OR_DEPARTMENT_OTHER): Payer: Medicare Other

## 2015-08-31 VITALS — BP 108/43 | HR 70 | Temp 97.7°F | Resp 17 | Ht 63.0 in | Wt 131.4 lb

## 2015-08-31 DIAGNOSIS — R109 Unspecified abdominal pain: Secondary | ICD-10-CM

## 2015-08-31 DIAGNOSIS — C772 Secondary and unspecified malignant neoplasm of intra-abdominal lymph nodes: Secondary | ICD-10-CM

## 2015-08-31 DIAGNOSIS — E46 Unspecified protein-calorie malnutrition: Secondary | ICD-10-CM | POA: Diagnosis not present

## 2015-08-31 DIAGNOSIS — C187 Malignant neoplasm of sigmoid colon: Secondary | ICD-10-CM

## 2015-08-31 DIAGNOSIS — C186 Malignant neoplasm of descending colon: Secondary | ICD-10-CM

## 2015-08-31 DIAGNOSIS — R530 Neoplastic (malignant) related fatigue: Secondary | ICD-10-CM

## 2015-08-31 DIAGNOSIS — Z5112 Encounter for antineoplastic immunotherapy: Secondary | ICD-10-CM

## 2015-08-31 DIAGNOSIS — R110 Nausea: Secondary | ICD-10-CM | POA: Diagnosis not present

## 2015-08-31 DIAGNOSIS — G4700 Insomnia, unspecified: Secondary | ICD-10-CM

## 2015-08-31 LAB — COMPREHENSIVE METABOLIC PANEL
ALT: 9 U/L (ref 0–55)
ANION GAP: 7 meq/L (ref 3–11)
AST: 10 U/L (ref 5–34)
Albumin: 3.3 g/dL — ABNORMAL LOW (ref 3.5–5.0)
Alkaline Phosphatase: 73 U/L (ref 40–150)
BUN: 21.7 mg/dL (ref 7.0–26.0)
CALCIUM: 9.2 mg/dL (ref 8.4–10.4)
CHLORIDE: 108 meq/L (ref 98–109)
CO2: 24 mEq/L (ref 22–29)
CREATININE: 1 mg/dL (ref 0.6–1.1)
EGFR: 49 mL/min/{1.73_m2} — AB (ref >90)
Glucose: 90 mg/dl (ref 70–140)
Potassium: 4.5 mEq/L (ref 3.5–5.1)
Sodium: 138 mEq/L (ref 136–145)
Total Protein: 7.2 g/dL (ref 6.4–8.3)

## 2015-08-31 LAB — CBC WITH DIFFERENTIAL/PLATELET
BASO%: 0.7 % (ref 0.0–2.0)
Basophils Absolute: 0.1 10*3/uL (ref 0.0–0.1)
EOS ABS: 0.4 10*3/uL (ref 0.0–0.5)
EOS%: 4.7 % (ref 0.0–7.0)
HCT: 36 % (ref 34.8–46.6)
HEMOGLOBIN: 11.8 g/dL (ref 11.6–15.9)
LYMPH#: 1.6 10*3/uL (ref 0.9–3.3)
LYMPH%: 19.9 % (ref 14.0–49.7)
MCH: 28 pg (ref 25.1–34.0)
MCHC: 32.9 g/dL (ref 31.5–36.0)
MCV: 85 fL (ref 79.5–101.0)
MONO#: 0.6 10*3/uL (ref 0.1–0.9)
MONO%: 7 % (ref 0.0–14.0)
NEUT%: 67.7 % (ref 38.4–76.8)
NEUTROS ABS: 5.6 10*3/uL (ref 1.5–6.5)
Platelets: 317 10*3/uL (ref 145–400)
RBC: 4.23 10*6/uL (ref 3.70–5.45)
RDW: 16 % — AB (ref 11.2–14.5)
WBC: 8.3 10*3/uL (ref 3.9–10.3)

## 2015-08-31 LAB — TSH: TSH: 1.092 m[IU]/L (ref 0.308–3.960)

## 2015-08-31 MED ORDER — TRAMADOL HCL 50 MG PO TABS: 50.0000 mg | ORAL_TABLET | Freq: Three times a day (TID) | ORAL | Status: DC | PRN

## 2015-08-31 MED ORDER — PEMBROLIZUMAB CHEMO INJECTION 100 MG/4ML
2.0000 mg/kg | Freq: Once | INTRAVENOUS | Status: AC
  Administered 2015-08-31: 125 mg via INTRAVENOUS
  Filled 2015-08-31: qty 5

## 2015-08-31 MED ORDER — METHYLPREDNISOLONE SODIUM SUCC 40 MG IJ SOLR
40.0000 mg | Freq: Once | INTRAMUSCULAR | Status: AC
  Administered 2015-08-31: 40 mg via INTRAVENOUS

## 2015-08-31 MED ORDER — METHYLPREDNISOLONE SODIUM SUCC 40 MG IJ SOLR
INTRAMUSCULAR | Status: AC
Start: 2015-08-31 — End: 2015-08-31
  Filled 2015-08-31: qty 1

## 2015-08-31 MED ORDER — SODIUM CHLORIDE 0.9 % IV SOLN
Freq: Once | INTRAVENOUS | Status: AC
  Administered 2015-08-31: 15:00:00 via INTRAVENOUS

## 2015-08-31 MED FILL — traMADol HCL 50 MG TABS: 50 | 30 days supply | Qty: 90 | Fill #0

## 2015-08-31 NOTE — Telephone Encounter (Signed)
Per staff message and POF I have scheduled appts. Advised scheduler of appts. JMW  

## 2015-08-31 NOTE — Telephone Encounter (Signed)
per pof to sch pt appt-sent MW email to sch trmt-pt to get updated coy b4 leaving**

## 2015-08-31 NOTE — Patient Instructions (Signed)
[facility] Discharge Instructions for Patients Receiving Chemotherapy  Today you received the following chemotherapy agents Keytruda  To help prevent nausea and vomiting after your treatment, we encourage you to take your nausea medication    If you develop nausea and vomiting that is not controlled by your nausea medication, call the clinic.   BELOW ARE SYMPTOMS THAT SHOULD BE REPORTED IMMEDIATELY:  *FEVER GREATER THAN 100.5 F  *CHILLS WITH OR WITHOUT FEVER  NAUSEA AND VOMITING THAT IS NOT CONTROLLED WITH YOUR NAUSEA MEDICATION  *UNUSUAL SHORTNESS OF BREATH  *UNUSUAL BRUISING OR BLEEDING  TENDERNESS IN MOUTH AND THROAT WITH OR WITHOUT PRESENCE OF ULCERS  *URINARY PROBLEMS  *BOWEL PROBLEMS  UNUSUAL RASH Items with * indicate a potential emergency and should be followed up as soon as possible.  Feel free to call the clinic you have any questions or concerns. The clinic phone number is [phone].  Please show the CHEMO ALERT CARD at check-in to the Emergency Department and triage nurse.   

## 2015-08-31 NOTE — Progress Notes (Signed)
Joann Herrera  [phone] [phone]  Clinic Follow up Note   Patient Care Team: [name], MD as PCP - General (Internal Medicine) Truitt Merle, MD as Consulting Physician (Hematology) Erroll Luna, MD as Consulting Physician (General Surgery) Milus Banister, MD as Attending Physician (Gastroenterology) 08/31/2015   CHIEF COMPLAINTS:  Follow up metastatic colon cancer   HISTORY OF PRESENTING ILLNESS(05/18/2015):  Joann Herrera 82 y.o. female is here because of recently diagnosed colon cancer. She is accompanied by her daughter and son to the clinic today.  She has had upper abdominal pain and intermittent nausea and vomiting for almost 2 years, she had multiple ED visit and hospitalization. She describes her pain is located in the epigastric area, usually happens after meals, 5/10 , radiates to back, with associated nausea and vomiting. She has moderate fatigue, she is no energy to do much activity except self cares. She has intermittent dizziness, especially when she stands up. Her appetite is low, she eats small meals. she had intermittent diarrhea for a few years with lossoe BM, but recently developed mild to moderate constipation, she lasot about 15 lbs in the past few years   She was referred to gastroenterologist Dr. Ardis Hughs, and underwent EGD on 01/22/2015, which was unremarkable. Abdominal ultrasound on 03/17/2015 showed gallbladder stone, and a 3.3 cm hypoechoic mass in the portal hepatitis. She subsequently underwent abdominal MRI on 04/30/2015, which showed a 2 cm short axis node in the porta hepatis and additional 1.4 cm port cable node, with central necrosis. She underwent a colonoscopy on 04/30/2015, which showed multiple polyps and a malignant appearing mass in the sigmoid colon, the biopsy confirmed adenocarcinoma. She was seen by surgeon Dr. Brantley Stage.  CURRENT THERAPY: Keytruda '200mg'$  iv every 3 weeks started on 07/08/2015, dose reduced to  '2mg'$ /kg from cycle 2 on 08/31/15 due to severe reaction after first infusion  INTERIM HISTORY: Essie returns for follow-up. She is accompanied by her daughter. She has been feeling slightly better lately, her abdominal pain is controlled with tramadol 2-3 times a day, she takes after meal, with much less nausea. She still quite fatigued with low appetite, she is able to take care of herself. She has been living by herself, and her daughter came from [place] are staying with her now for the next 2 weeks.  MEDICAL HISTORY:  Past Medical History  Diagnosis Date  . Pneumonia 04/2012  . Rheumatoid Arthritis   . Macular degeneration   . Complication of anesthesia     "morphine caused severe shakes"  . PONV (postoperative nausea and vomiting)   . GERD (gastroesophageal reflux disease)   . Cancer (HCC)     squamous cell  . Colon carcinoma ([place])     dx. left Colon cancer,evidence of metastasis    SURGICAL HISTORY: Past Surgical History  Procedure Laterality Date  . Total knee arthroplasty      x 2  . Hemorrhoid surgery    . Cataracts    . Hand surgery      Pt states she had both hands operated on for arthritis.  . Shoulder surgery Left     arthroscopy  . Total knee arthroplasty Right   . Total knee arthroplasty Left   . Hand surgery    . Cataract extraction, bilateral    . Tonsillectomy    . Shoulder arthroscopy Left   . Minor hemorrhoidectomy    . Esophagogastroduodenoscopy N/A 01/22/2015    Procedure: ESOPHAGOGASTRODUODENOSCOPY (EGD);  Surgeon: Quillian Quince  Merrily Brittle, MD;  Location: [facility];  Service: Endoscopy;  Laterality: N/A;  . Joint replacement    . Eye surgery      bilateral cataracts  . Colonoscopy with propofol N/A 04/30/2015    Procedure: COLONOSCOPY WITH PROPOFOL;  Surgeon: Milus Banister, MD;  Location: [facility];  Service: Endoscopy;  Laterality: N/A;    SOCIAL HISTORY: Social History   Social History  . Marital Status: Widowed    Spouse Name: N/A  . Number of  Children: 3  . Years of Education: N/A   Occupational History  . Retired    Social History Main Topics  . Smoking status: Current Every Day Smoker -- 1.00 packs/day  . Smokeless tobacco: Never Used  . Alcohol Use: No  . Drug Use: No  . Sexual Activity: Not on file   Other Topics Concern  . Not on file   Social History Narrative   ** Merged History Encounter **       Daughter in [place] Ivin Booty [place]) [phone]   Son in [place]   Son in [place]   Retired- Medical sales representative business, worked at [employer] for 28 years   Enjoys dancing and going to [place]   Completed 11th grade   Very poor vision due to macular degeneration          FAMILY HISTORY: Family History  Problem Relation Age of Onset  . Arthritis Mother   . Arthritis-Osteo Mother   . Cancer Sister 46    breast  . Diabetes Sister   . Diabetes Brother   . Suicidality Brother   . Diabetes Brother   . Breast cancer Sister   . Cancer Maternal Aunt     breast cancer     ALLERGIES:  is allergic to aspirin; codeine; morphine and related; and penicillins.  MEDICATIONS:  Current Outpatient Prescriptions  Medication Sig Dispense Refill  . acetaminophen (TYLENOL) 500 MG tablet Take 1,000 mg by mouth every 6 (six) hours as needed. For pain    . ondansetron (ZOFRAN) 8 MG tablet Take 1 tablet (8 mg total) by mouth every 8 (eight) hours as needed for nausea. May fill with ODT if cost not prohibitive. 30 tablet 3  . traMADol (ULTRAM) 50 MG tablet Take 1 tablet (50 mg total) by mouth every 8 (eight) hours as needed. 90 tablet 0  . prochlorperazine (COMPAZINE) 10 MG tablet Take 1 tablet (10 mg total) by mouth every 6 (six) hours as needed. (Patient not taking: Reported on 08/31/2015) 30 tablet 3   No current facility-administered medications for this visit.    REVIEW OF SYSTEMS:   Constitutional: Denies fevers, chills or abnormal night sweats Eyes: Denies blurriness of vision, double  vision or watery eyes Ears, nose, mouth, throat, and face: Denies mucositis or sore throat Respiratory: Denies cough, dyspnea or wheezes Cardiovascular: Denies palpitation, chest discomfort or lower extremity swelling Gastrointestinal:  Denies nausea, heartburn or change in bowel habits Skin: Denies abnormal skin rashes Lymphatics: Denies new lymphadenopathy or easy bruising Neurological:Denies numbness, tingling or new weaknesses Behavioral/Psych: Mood is stable, no new changes  All other systems were reviewed with the patient and are negative.  PHYSICAL EXAMINATION: ECOG PERFORMANCE STATUS: 3  There were no vitals filed for this visit. There were no vitals filed for this visit.  GENERAL:alert, no distress and comfortable SKIN: skin color, texture, turgor are normal, no rashes or significant lesions EYES: normal, conjunctiva are pink and non-injected, sclera clear  OROPHARYNX:no exudate, no erythema and lips, buccal mucosa, and tongue normal  NECK: supple, thyroid normal size, non-tender, without nodularity LYMPH:  no palpable lymphadenopathy in the cervical, axillary or inguinal LUNGS: clear to auscultation and percussion with normal breathing effort HEART: regular rate & rhythm and no murmurs and no lower extremity edema ABDOMEN:abdomen soft, mild tenderness at RUQ and epigastric area, normal bowel sounds Musculoskeletal:no cyanosis of digits and no clubbing  PSYCH: alert & oriented x 3 with fluent speech NEURO: no focal motor/sensory deficits  LABORATORY DATA:  I have reviewed the data as listed CBC Latest Ref Rng 08/31/2015 08/14/2015 07/25/2015  WBC 3.9 - 10.3 10e3/uL 8.3 5.8 8.5  Hemoglobin 11.6 - 15.9 g/dL 11.8 12.0 11.1(L)  Hematocrit 34.8 - 46.6 % 36.0 37.1 33.9(L)  Platelets 145 - 400 10e3/uL 317 330 133(L)    CMP Latest Ref Rng 08/31/2015 08/14/2015 07/25/2015  Glucose 70 - 140 mg/dl 90 103 106(H)  BUN 7.0 - 26.0 mg/dL 21.7 16.7 22(H)  Creatinine 0.6 - 1.1 mg/dL 1.0 1.1  1.03(H)  Sodium 136 - 145 mEq/L 138 138 137  Potassium 3.5 - 5.1 mEq/L 4.5 4.1 4.0  Chloride 101 - 111 mmol/L - - 106  CO2 22 - 29 mEq/L 24 19(L) 23  Calcium 8.4 - 10.4 mg/dL 9.2 9.1 8.0(L)  Total Protein 6.4 - 8.3 g/dL 7.2 7.2 6.4(L)  Total Bilirubin 0.20 - 1.20 mg/dL <0.30 0.38 0.4  Alkaline Phos 40 - 150 U/L 73 82 56  AST 5 - 34 U/L 10 13 14(L)  ALT 0 - 55 U/L 9 <9 9(L)    PATHOLOGY REPORT  Diagnosis 04/29/2014 1. Colon, biopsy, ascending - TUBULAR ADENOMA. NO HIGH GRADE DYSPLASIA OR MALIGNANCY IDENTIFIED. 2. Colon, biopsy, 35 cm sigmoid mass r/o malignancy - POORLY DIFFERENTIATED INVASIVE ADENOCARCINOMA. 3. Colon, polyp(s), sigmoid - TUBULAR ADENOMA. NO HIGH GRADE DYSPLASIA OR MALIGNANCY IDENTIFIED.   RADIOGRAPHIC STUDIES: I have personally reviewed the radiological images as listed and agreed with the findings in the report. No results found. Colonoscopy 04/30/2015 Dr. Ardis Hughs  1. Three polyps were found, removed, two were sent to pathology (jar 1). 2. 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 This was located in proximal sigmoid colon (35-40cm from anus), was biopsied extensively (jar 2) and then the site was labeled with submucosal SPOT injection. 3. There was a large polypoid lesion in the distal sigmoid (15-17cm from anus) that was 3.5cm across, fairly soft but clearly too large for endoscopic resection. This was extensively biopsied (jar 3) and also labeled with submcosal injection of SPOT. 4. There were two to three small sessile satelite polyps located just distal to the distal sigmoid mass (not removed). There were numerous diverticulum in the left colon. The examination was otherwise normal   EGD ENDOSCOPIC IMPRESSION: 01/22/2015 There was mild, pan gastric atrophic changes (atrophic gastritis). This was biopsied and sent to pathology. There was a 2cm hiatal hernia. The examination was otherwise  normal  RECOMMENDATIONS: If biopsies are positive for H. pylori, will recommend that you restart you antibiotics and will prophylax with scheduled antinausea medicines.  RECOMMENDATIONS: Await final pathology. MRI/MRCP pending as well (to evaluate the periportal lesion). My office will arrange referral to medical oncology and general surgery. The periportal lesion may be a site of distant metastasis or be a second issue. May require further evaluation after MRI (PET scan vs. EUS).    ASSESSMENT & PLAN:  83 year old female, with past medical history of  rheumatoid arthritis, macular degeneration, and squamous cell skin cancer, presented with epigastric pain, nausea and weight loss.  1. Sigmoid colon cancer, cTxNxM1, clinical stage IV with periportal node metastases, poorly differentiated adenocarcinoma, MSI-high  -I previously reviewed her colonoscopy findings and pathology results from the biopsies. -She has no significant regional lymphadenopathy, but has two large necrotic appearing periportal nodes, which are very suspicious for metastasis. -I reviewed her PET scan findings with patient and her family members, which showed hypermetabolic periportal lymph nodes and sigmoid colon mass, no other metastasis. No other primary tumor was seen on the PET scan, I think the PET portal lymphadenopathy are likely distant metastasis from her colon cancer. -She has tried [place] treatment once, but had severe abdominal pain, weakness and nausea, and treatment has been held since then -She has recovered some, overall doing better lately. She has declined hospice, and strongly wants to continue [place] therapy. Potential side effects were reviewed with her and can, she understands she may have reaction again, and still wants to try  -I will reduced Keytruda dose to 125 mg (37m/kg), and absolute medical 40 mg before treatment today, I also instructed her to take Benadryl 25 mg at bedtime today -I will see her  back in 4 days for follow-up. -If she tolerates well, we'll continue, and a repeat restaging scan after 2 more treatments.  2. Abdominal pain -continue tramadol as needed. I refilled today  - she refuses narcotics, she had bad experience with morphine and codein in the past.  3. Nausea and vomiting -continue Zofran as need, she did not tolerate the Compazine well, had dizziness  4. Fatigue and malnutrition -I encouraged her to take nutrition supplement,  Such as ensure  -follow up with dietitian    5. Insomnia -I encouraged her to try Benadryl, melatonin over-the-counter first -Due to her age and sensitivity to medication, I'm little hesitated to prescribe any prescription medication for her insomnia   Plan: -resume K[place] with dose reduction to 125 mg, and solumedro 40 mg as premeds. She will also take Benadryl tonight -I will see her back in 4 days for close follow up   All questions were answered. The patient knows to call the clinic with any problems, questions or concerns.  I spent 30 minutes counseling the patient face to face. The total time spent in the appointment was 40 minutes and more than 50% was on counseling.     FTruitt Merle MD 08/31/2015

## 2015-09-02 ENCOUNTER — Telehealth: Payer: Self-pay | Admitting: *Deleted

## 2015-09-02 NOTE — Telephone Encounter (Signed)
Left vm with daughter, Emmaline Life to call us back tomorrow afternoon & if pt is doing well, may cancel appt on Fri per Dr Burr Medico.

## 2015-09-02 NOTE — Telephone Encounter (Signed)
  Oncology Nurse Navigator Documentation  Navigator Location: [facility] (09/02/15 0831) Navigator Encounter Type: Telephone (09/02/15 0831) Telephone: Incoming Call;Appt Confirmation/Clarification (09/02/15 0831)  Call from daughter to inquire if [name] still needed to see [place] on Friday (seems to remember MD suggesting this)? Reports Barabra is doing "pretty good" since her last treatment. Forwarded message to MD.

## 2015-09-03 ENCOUNTER — Telehealth: Payer: Self-pay | Admitting: *Deleted

## 2015-09-03 NOTE — Telephone Encounter (Signed)
Spoke with daughter, Ivin Booty & pt is doing good with this last chemo & is eating some.  Encouraged po fluids & to call if problems.  She is not sleeping good at hs but is napping a lot during the day.  Encouraged to keep pt busy during the day & to decrease naps to see if she will sleep better at hs.  Appts cancelled for tomorrow but encouraged daughter to call if concerns.

## 2015-09-04 ENCOUNTER — Other Ambulatory Visit: Payer: Medicare Other

## 2015-09-04 ENCOUNTER — Ambulatory Visit: Payer: Medicare Other | Admitting: Hematology

## 2015-09-07 ENCOUNTER — Telehealth: Payer: Self-pay | Admitting: Hematology

## 2015-09-07 ENCOUNTER — Telehealth: Payer: Self-pay | Admitting: *Deleted

## 2015-09-07 NOTE — Telephone Encounter (Signed)
Received call from daughter Ivin Booty requesting to talk to nurse about her mother.  Ivin Booty thinks pt might need to be hydrated.  Attempted to call Ivin Booty back unsuccessfully.   Left message on voice mail requesting a call back from [place]. [facility]   Phone      [phone].

## 2015-09-07 NOTE — Telephone Encounter (Signed)
I called pt's daughter Ivin Booty. She reports worsening fatigue and anorexia, she has not been eating or drinking much at all since yesterday. I recommend her to bring pt in tomorrow morning to see our [facility]. We will call her in the morning to confirm her appointment. I encourage her to drink more water tonight. She voiced good understanding and appreciated the call.  Truitt Merle  09/07/2015

## 2015-09-08 ENCOUNTER — Encounter: Payer: Self-pay | Admitting: Nurse Practitioner

## 2015-09-08 ENCOUNTER — Other Ambulatory Visit: Payer: Self-pay | Admitting: Nurse Practitioner

## 2015-09-08 ENCOUNTER — Telehealth: Payer: Self-pay | Admitting: Nurse Practitioner

## 2015-09-08 ENCOUNTER — Other Ambulatory Visit (HOSPITAL_BASED_OUTPATIENT_CLINIC_OR_DEPARTMENT_OTHER): Payer: Medicare Other

## 2015-09-08 ENCOUNTER — Ambulatory Visit (HOSPITAL_BASED_OUTPATIENT_CLINIC_OR_DEPARTMENT_OTHER): Payer: Medicare Other | Admitting: Nurse Practitioner

## 2015-09-08 ENCOUNTER — Telehealth: Payer: Self-pay | Admitting: *Deleted

## 2015-09-08 ENCOUNTER — Ambulatory Visit: Payer: Medicare Other

## 2015-09-08 VITALS — BP 117/48 | HR 66 | Temp 98.6°F | Resp 18 | Ht 63.0 in | Wt 130.0 lb

## 2015-09-08 DIAGNOSIS — C186 Malignant neoplasm of descending colon: Secondary | ICD-10-CM

## 2015-09-08 DIAGNOSIS — G4700 Insomnia, unspecified: Secondary | ICD-10-CM

## 2015-09-08 DIAGNOSIS — E860 Dehydration: Secondary | ICD-10-CM

## 2015-09-08 DIAGNOSIS — K5900 Constipation, unspecified: Secondary | ICD-10-CM | POA: Diagnosis not present

## 2015-09-08 DIAGNOSIS — C187 Malignant neoplasm of sigmoid colon: Secondary | ICD-10-CM

## 2015-09-08 LAB — COMPREHENSIVE METABOLIC PANEL
ALT: 10 U/L (ref 0–55)
AST: 12 U/L (ref 5–34)
Albumin: 3.2 g/dL — ABNORMAL LOW (ref 3.5–5.0)
Alkaline Phosphatase: 82 U/L (ref 40–150)
Anion Gap: 7 mEq/L (ref 3–11)
BUN: 18.4 mg/dL (ref 7.0–26.0)
CO2: 24 meq/L (ref 22–29)
Calcium: 9.2 mg/dL (ref 8.4–10.4)
Chloride: 106 mEq/L (ref 98–109)
Creatinine: 1 mg/dL (ref 0.6–1.1)
EGFR: 52 mL/min/{1.73_m2} — AB (ref >90)
GLUCOSE: 102 mg/dL (ref 70–140)
Potassium: 4.1 mEq/L (ref 3.5–5.1)
SODIUM: 138 meq/L (ref 136–145)
TOTAL PROTEIN: 7.4 g/dL (ref 6.4–8.3)

## 2015-09-08 LAB — CBC WITH DIFFERENTIAL/PLATELET
BASO%: 0.6 % (ref 0.0–2.0)
Basophils Absolute: 0 10*3/uL (ref 0.0–0.1)
EOS%: 4.3 % (ref 0.0–7.0)
Eosinophils Absolute: 0.3 10*3/uL (ref 0.0–0.5)
HCT: 37.3 % (ref 34.8–46.6)
HGB: 12.1 g/dL (ref 11.6–15.9)
LYMPH%: 20.6 % (ref 14.0–49.7)
MCH: 27.3 pg (ref 25.1–34.0)
MCHC: 32.3 g/dL (ref 31.5–36.0)
MCV: 84.6 fL (ref 79.5–101.0)
MONO#: 0.6 10*3/uL (ref 0.1–0.9)
MONO%: 8.2 % (ref 0.0–14.0)
NEUT%: 66.3 % (ref 38.4–76.8)
NEUTROS ABS: 4.5 10*3/uL (ref 1.5–6.5)
Platelets: 369 10*3/uL (ref 145–400)
RBC: 4.41 10*6/uL (ref 3.70–5.45)
RDW: 15.6 % — ABNORMAL HIGH (ref 11.2–14.5)
WBC: 6.8 10*3/uL (ref 3.9–10.3)
lymph#: 1.4 10*3/uL (ref 0.9–3.3)

## 2015-09-08 MED ORDER — SODIUM CHLORIDE 0.9 % IV SOLN
Freq: Once | INTRAVENOUS | Status: DC
Start: 2015-09-08 — End: 2015-09-08

## 2015-09-08 NOTE — Telephone Encounter (Signed)
Spoke to pt daughter per [name]. Pt did not sleep last night, she will be able to bring her in today at 1pm for lab and 130 to see [facility]. POF and labs entered. Infusion room called to inquire about adding on IVF.

## 2015-09-08 NOTE — Progress Notes (Signed)
1505 1 attempt at IV stick, got in vein and could not advance catheter. Requested assistance and Elmyra Ricks was going to start IV. Pt was crying and pulled arm away. She refused second IV attempt. S/w cyndee bacon NP. Set up appt for tomorrow for fluids only.

## 2015-09-08 NOTE — Telephone Encounter (Signed)
per pof to sch pt w/Cyndee Berniece Salines today-pt aware

## 2015-09-08 NOTE — Assessment & Plan Note (Signed)
Patient last received Keytruda chemotherapy on 08/31/2015.  Patient complains of increasing fatigue and some mild dehydration.  Most likely secondary to poor oral intake.  Patient states that she is increasingly fatigued: Has minimal appetite.  She has had very poor oral intake and feels mildly dehydrated today.  Patient states that she spends most of her time in the better on the couch.  Ordered IV fluid rehydration today; and patient was transferred over to the infusion room for IV fluids to be initiated.  However, patient made the decision to leave after one IV stick attempt.  She refused to consider waiting to have an IV inserted and to receive IV fluids.  Patient was once again adamant that she does not want to consider hospice care at this point.  She wants to continue with active treatment for her cancer.  There was a long discussion with both the patient and the daughter regarding home health and possible IV fluid rehydration and the patient's home.  Patient initially agreed to this; but then stated that she may or may not answer the door is home health comes to visit her.  Discussed further with patient's daughter via telephone call; and daughter stated that she was going to talk with her mother in further detail this evening.  The plan is for the patient and her daughter to let this provider know if they are agreeable with home health care visits and possible IV fluid rehydration.  Also, long discussion with both patient and her.her regarding in-home physical therapy for strengthening exercises.  Patient adamantly refuses to consider any strengthening exercises; and states that she is unable to consider increasing her activity level at this time.  Patient is scheduled to return tomorrow to receive IV fluid rehydration.  She is scheduled to return on 09/25/2015 for labs, visit, and her next cycle of chemotherapy.

## 2015-09-08 NOTE — Assessment & Plan Note (Signed)
Patient last received Keytruda chemotherapy on 08/31/2015.  Patient complains of increasing fatigue and some mild dehydration.  Most likely secondary to poor oral intake.  Blood counts obtained today were all within normal limits.  Patient was once again adamant that she does not want to consider hospice care at this point.  She wants to continue with active treatment for her cancer.  There was a long discussion with both the patient and the daughter regarding home health and possible IV fluid rehydration and the patient's home.  Patient initially agreed to this; but then stated that she may or may not answer the door is home health comes to visit her.  Discussed further with patient's daughter via telephone call; and daughter stated that she was going to talk with her mother in further detail this evening.  The plan is for the patient and her daughter to let this provider know if they are agreeable with home health care visits and possible IV fluid rehydration.  Patient is scheduled to return tomorrow to receive IV fluid rehydration.  She is scheduled to return on 09/25/2015 for labs, visit, and her next cycle of chemotherapy.

## 2015-09-08 NOTE — Assessment & Plan Note (Signed)
Patient states she suffers with chronic constipation for the past several years.  Patient's last bowel movement was 3 days ago.  Patient states that she will occasionally take me a lack; but she does not like it.  She is also tried mag citrate in the past with minimal results.  Patient states she has no nausea or vomiting; he can drink liquids with no issues.  Abdomen was soft and nontender; bowel sounds 4 quads.  Patient was advised to try to stool softeners twice daily and Maalox every 6 hours until she has cleared her constipation.  Also advised patient that she should consider a regular bowel regimen to avoid constipation.

## 2015-09-08 NOTE — Progress Notes (Signed)
SYMPTOM MANAGEMENT CLINIC    Chief Complaint: Fatigue and dehydration  HPI:  Joann Herrera 82 y.o. female diagnosed with colon cancer.  Currently undergoing Keytruda therapy.   Patient last received Keytruda chemotherapy on 08/31/2015.  Patient complains of increasing fatigue and some mild dehydration.  Most likely secondary to poor oral intake.  Patient states that she is increasingly fatigued: Has minimal appetite.  She has had very poor oral intake and feels mildly dehydrated today.  Patient states that she spends most of her time in the better on the couch.  Ordered IV fluid rehydration today; and patient was transferred over to the infusion room for IV fluids to be initiated.  However, patient made the decision to leave after one IV stick attempt.  She refused to consider waiting to have an IV inserted and to receive IV fluids.  Patient was once again adamant that she does not want to consider hospice care at this point.  She wants to continue with active treatment for her cancer.  There was a long discussion with both the patient and the daughter regarding home health and possible IV fluid rehydration and the patient's home.  Patient initially agreed to this; but then stated that she may or may not answer the door is home health comes to visit her.  Discussed further with patient's daughter via telephone call; and daughter stated that she was going to talk with her mother in further detail this evening.  The plan is for the patient and her daughter to let this provider know if they are agreeable with home health care visits and possible IV fluid rehydration.  Also, long discussion with both patient and her.her regarding in-home physical therapy for strengthening exercises.  Patient adamantly refuses to consider any strengthening exercises; and states that she is unable to consider increasing her activity level at this time.  Patient is scheduled to return tomorrow to receive IV fluid  rehydration.  She is scheduled to return on 09/25/2015 for labs, visit, and her next cycle of chemotherapy.  Oncology History   Cancer of left colon [facility])   Staging form: Colon and Rectum, AJCC 7th Edition     Clinical stage from 04/29/2014: Stage Unknown ([place], NX, M1) - Signed by Truitt Merle, MD on 08/31/2015       Cancer of left colon ([place])   04/29/2014 Procedure colonoscopy showed an ulcerated pancreatic type lesion was raised edges, 1.6 cm across, in the sigmoid colon. 6 polyps were removed.   04/30/2015 Initial Diagnosis Cancer of left colon ([place])   04/30/2015 Pathology Results sigmoid colon mass biopsy showed poorly differentiated invasive adenocarcinoma.   04/30/2015 M[facility] one genomic testing showed (+) BRAF, MSI-H, high mutation burden, negative for K-ras and NRAS   05/27/2015 Imaging PET scan showed 2 foci of asymmetric metabolic activity in distal sigmoid colon, and proximal descending colon. Intensely hypermetabolic periportal lymph nodes    07/08/2015 -  Chemotherapy Keytruda '200mg'$  every 3 weeks, pt had severe reaction on the first night after first infusion, and second cycle was postponed to 5/8, with dose reduction to '2mg'$ /kg    Review of Systems  Constitutional: Positive for malaise/fatigue.  HENT:       Patient suffers with bilateral macular degeneration; is considered legally blind.  Was wearing sunglasses today.  Gastrointestinal: Positive for constipation.  Neurological: Positive for weakness.  All other systems reviewed and are negative.   Past Medical History  Diagnosis Date  . Pneumonia 04/2012  . Rheumatoid  Arthritis   . Macular degeneration   . Complication of anesthesia     "morphine caused severe shakes"  . PONV (postoperative nausea and vomiting)   . GERD (gastroesophageal reflux disease)   . Cancer (HCC)     squamous cell  . Colon carcinoma (HCC)     dx. left Colon cancer,evidence of metastasis    Past Surgical History  Procedure Laterality Date    . Total knee arthroplasty      x 2  . Hemorrhoid surgery    . Cataracts    . Hand surgery      Pt states she had both hands operated on for arthritis.  . Shoulder surgery Left     arthroscopy  . Total knee arthroplasty Right   . Total knee arthroplasty Left   . Hand surgery    . Cataract extraction, bilateral    . Tonsillectomy    . Shoulder arthroscopy Left   . Minor hemorrhoidectomy    . Esophagogastroduodenoscopy N/A 01/22/2015    Procedure: ESOPHAGOGASTRODUODENOSCOPY (EGD);  Surgeon: Milus Banister, MD;  Location: [facility];  Service: Endoscopy;  Laterality: N/A;  . Joint replacement    . Eye surgery      bilateral cataracts  . Colonoscopy with propofol N/A 04/30/2015    Procedure: COLONOSCOPY WITH PROPOFOL;  Surgeon: Milus Banister, MD;  Location: [facility];  Service: Endoscopy;  Laterality: N/A;    has Right BBB/left post fasc block; GERD (gastroesophageal reflux disease); Unsteady gait; Other malaise and fatigue; Depression; Insomnia; Squamous cell carcinoma ([place]); Vertigo; CVA (cerebral infarction); Arthritis; Pre-syncope; Hypocalcemia; Hypertriglyceridemia; Abdominal pain, epigastric; Dry eye; Horseshoe tear of retina without detachment; Age-related macular degeneration, dry; Gastritis; Aortic arch atherosclerosis ([place]); Numbness; Nicotine dependence; Cholelithiasis; Dilated cbd, acquired; Abdominal aortic aneurysm ([place]); Cancer of left colon ([place]); Atypical chest pain; Metastatic colon cancer in female [facility]); Dehydration; and Constipation on her problem list.    is allergic to aspirin; codeine; morphine and related; and penicillins.    Medication List       This list is accurate as of: 09/08/15  5:54 PM.  Always use your most recent med list.               acetaminophen 500 MG tablet  Commonly known as:  TYLENOL  Take 1,000 mg by mouth every 6 (six) hours as needed. For pain     ondansetron 8 MG tablet  Commonly known as:  ZOFRAN  Take 1 tablet (8 mg total)  by mouth every 8 (eight) hours as needed for nausea. May fill with ODT if cost not prohibitive.     prochlorperazine 10 MG tablet  Commonly known as:  COMPAZINE  Take 1 tablet (10 mg total) by mouth every 6 (six) hours as needed.     traMADol 50 MG tablet  Commonly known as:  ULTRAM  Take 1 tablet (50 mg total) by mouth every 8 (eight) hours as needed.         PHYSICAL EXAMINATION  Oncology Vitals 09/08/2015 08/31/2015  Height 160 cm 160 cm  Weight 58.968 kg 59.603 kg  Weight (lbs) 130 lbs 131 lbs 6 oz  BMI (kg/m2) 23.03 kg/m2 23.28 kg/m2  Temp 98.6 97.7  Pulse 66 70  Resp 18 17  SpO2 97 100  BSA (m2) 1.62 m2 1.63 m2   BP Readings from Last 2 Encounters:  09/08/15 117/48  08/31/15 108/43    Physical Exam  Constitutional: Vital signs are normal. She appears malnourished and  dehydrated. She appears unhealthy. She appears cachectic.  HENT:  Head: Normocephalic and atraumatic.  Mouth/Throat: Oropharynx is clear and moist.  Eyes: Right eye exhibits no discharge. Left eye exhibits no discharge. No scleral icterus.  Patient is legally blind with macular degeneration; and was wearing sunglasses today.  Neck: Normal range of motion. Neck supple. No JVD present. No tracheal deviation present. No thyromegaly present.  Cardiovascular: Normal rate, regular rhythm, normal heart sounds and intact distal pulses.   Pulmonary/Chest: Effort normal and breath sounds normal. No respiratory distress. She has no wheezes. She has no rales. She exhibits no tenderness.  Abdominal: Soft. Bowel sounds are normal. She exhibits no distension and no mass. There is no tenderness. There is no rebound and no guarding.  Musculoskeletal: Normal range of motion. She exhibits no edema or tenderness.  Lymphadenopathy:    She has no cervical adenopathy.  Neurological: She is alert.  Skin: Skin is warm and dry. No rash noted. No erythema. No pallor.  Psychiatric: Affect normal.  Nursing note and vitals  reviewed.   LABORATORY DATA:. Appointment on 09/08/2015  Component Date Value Ref Range Status  . WBC 09/08/2015 6.8  3.9 - 10.3 10e3/uL Final  . NEUT# 09/08/2015 4.5  1.5 - 6.5 10e3/uL Final  . HGB 09/08/2015 12.1  11.6 - 15.9 g/dL Final  . HCT 09/08/2015 37.3  34.8 - 46.6 % Final  . Platelets 09/08/2015 369  145 - 400 10e3/uL Final  . MCV 09/08/2015 84.6  79.5 - 101.0 fL Final  . MCH 09/08/2015 27.3  25.1 - 34.0 pg Final  . MCHC 09/08/2015 32.3  31.5 - 36.0 g/dL Final  . RBC 09/08/2015 4.41  3.70 - 5.45 10e6/uL Final  . RDW 09/08/2015 15.6* 11.2 - 14.5 % Final  . lymph# 09/08/2015 1.4  0.9 - 3.3 10e3/uL Final  . MONO# 09/08/2015 0.6  0.1 - 0.9 10e3/uL Final  . Eosinophils Absolute 09/08/2015 0.3  0.0 - 0.5 10e3/uL Final  . Basophils Absolute 09/08/2015 0.0  0.0 - 0.1 10e3/uL Final  . NEUT% 09/08/2015 66.3  38.4 - 76.8 % Final  . LYMPH% 09/08/2015 20.6  14.0 - 49.7 % Final  . MONO% 09/08/2015 8.2  0.0 - 14.0 % Final  . EOS% 09/08/2015 4.3  0.0 - 7.0 % Final  . BASO% 09/08/2015 0.6  0.0 - 2.0 % Final  . Sodium 09/08/2015 138  136 - 145 mEq/L Final  . Potassium 09/08/2015 4.1  3.5 - 5.1 mEq/L Final  . Chloride 09/08/2015 106  98 - 109 mEq/L Final  . CO2 09/08/2015 24  22 - 29 mEq/L Final  . Glucose 09/08/2015 102  70 - 140 mg/dl Final   Glucose reference range is for nonfasting patients. Fasting glucose reference range is 70- 100.  Marland Kitchen BUN 09/08/2015 18.4  7.0 - 26.0 mg/dL Final  . Creatinine 09/08/2015 1.0  0.6 - 1.1 mg/dL Final  . Total Bilirubin 09/08/2015 <0.30  0.20 - 1.20 mg/dL Final  . Alkaline Phosphatase 09/08/2015 82  40 - 150 U/L Final  . AST 09/08/2015 12  5 - 34 U/L Final  . ALT 09/08/2015 10  0 - 55 U/L Final  . Total Protein 09/08/2015 7.4  6.4 - 8.3 g/dL Final  . Albumin 09/08/2015 3.2* 3.5 - 5.0 g/dL Final  . Calcium 09/08/2015 9.2  8.4 - 10.4 mg/dL Final  . Anion Gap 09/08/2015 7  3 - 11 mEq/L Final  . EGFR 09/08/2015 52* >90 ml/min/1.73 m2 Final  eGFR is  calculated using the CKD-EPI Creatinine Equation (2009)    RADIOGRAPHIC STUDIES: No results found.  ASSESSMENT/PLAN:    Insomnia Patient states that she is a history of chronic insomnia for several years now.  She states that she has tried Benadryl, melatonin, and other over-the-counter medications with no effect on her insomnia.  Patient states that she will occasionally take an Ultram prior to bed; and that helps relieve her chronic generalized discomfort; and only occasionally makes her sleepy.  Patient's daughter reports the patient typically gets up afternoon; and sometimes as late as 2 PM on most days.  Patient admits that she is minimally active; and spends most of her time either in the bed or on the couch watching TV.  Advised patient that she may very well have her days and nights mixed up; she should try to avoid any treatment today; and to increase her activity level.  Both patient and her daughter asked if there was a possibility.  The patient could try half of a Unisom over-the-counter to see if this helps with patient's insomnia.  Advised patient she may try half of a tablet of the Unisom.  Cancer of left colon [facility]) Patient last received Keytruda chemotherapy on 08/31/2015.  Patient complains of increasing fatigue and some mild dehydration.  Most likely secondary to poor oral intake.  Blood counts obtained today were all within normal limits.  Patient was once again adamant that she does not want to consider hospice care at this point.  She wants to continue with active treatment for her cancer.  There was a long discussion with both the patient and the daughter regarding home health and possible IV fluid rehydration and the patient's home.  Patient initially agreed to this; but then stated that she may or may not answer the door is home health comes to visit her.  Discussed further with patient's daughter via telephone call; and daughter stated that she was going to talk with  her mother in further detail this evening.  The plan is for the patient and her daughter to let this provider know if they are agreeable with home health care visits and possible IV fluid rehydration.  Patient is scheduled to return tomorrow to receive IV fluid rehydration.  She is scheduled to return on 09/25/2015 for labs, visit, and her next cycle of chemotherapy.    Dehydration Patient last received Keytruda chemotherapy on 08/31/2015.  Patient complains of increasing fatigue and some mild dehydration.  Most likely secondary to poor oral intake.  Patient states that she is increasingly fatigued: Has minimal appetite.  She has had very poor oral intake and feels mildly dehydrated today.  Patient states that she spends most of her time in the better on the couch.  Ordered IV fluid rehydration today; and patient was transferred over to the infusion room for IV fluids to be initiated.  However, patient made the decision to leave after one IV stick attempt.  She refused to consider waiting to have an IV inserted and to receive IV fluids.  Patient was once again adamant that she does not want to consider hospice care at this point.  She wants to continue with active treatment for her cancer.  There was a long discussion with both the patient and the daughter regarding home health and possible IV fluid rehydration and the patient's home.  Patient initially agreed to this; but then stated that she may or may not answer the door is home health comes  to visit her.  Discussed further with patient's daughter via telephone call; and daughter stated that she was going to talk with her mother in further detail this evening.  The plan is for the patient and her daughter to let this provider know if they are agreeable with home health care visits and possible IV fluid rehydration.  Also, long discussion with both patient and her.her regarding in-home physical therapy for strengthening exercises.  Patient adamantly  refuses to consider any strengthening exercises; and states that she is unable to consider increasing her activity level at this time.  Patient is scheduled to return tomorrow to receive IV fluid rehydration.  She is scheduled to return on 09/25/2015 for labs, visit, and her next cycle of chemotherapy.  Constipation Patient states she suffers with chronic constipation for the past several years.  Patient's last bowel movement was 3 days ago.  Patient states that she will occasionally take me a lack; but she does not like it.  She is also tried mag citrate in the past with minimal results.  Patient states she has no nausea or vomiting; he can drink liquids with no issues.  Abdomen was soft and nontender; bowel sounds 4 quads.  Patient was advised to try to stool softeners twice daily and Maalox every 6 hours until she has cleared her constipation.  Also advised patient that she should consider a regular bowel regimen to avoid constipation.  Had initiated the home health care visit orders and possible IV fluid rehydration; but all orders were placed on hold until patient makes decision regarding home health care.    Patient stated understanding of all instructions; and was in agreement with this plan of care. The patient knows to call the clinic with any problems, questions or concerns.   Total time spent with patient 40 minutes;  with greater t75 percent of that time spent in face to face counseling regarding patient's symptoms,  and coordination of care and follow up.  Disclaimer:This dictation was prepared with Dragon/digital dictation along with [employer]. Any transcriptional errors that result from this process are unintentional.  Drue Second, NP 09/08/2015

## 2015-09-08 NOTE — Assessment & Plan Note (Signed)
Patient states that she is a history of chronic insomnia for several years now.  She states that she has tried Benadryl, melatonin, and other over-the-counter medications with no effect on her insomnia.  Patient states that she will occasionally take an Ultram prior to bed; and that helps relieve her chronic generalized discomfort; and only occasionally makes her sleepy.  Patient's daughter reports the patient typically gets up afternoon; and sometimes as late as 2 PM on most days.  Patient admits that she is minimally active; and spends most of her time either in the bed or on the couch watching TV.  Advised patient that she may very well have her days and nights mixed up; she should try to avoid any treatment today; and to increase her activity level.  Both patient and her daughter asked if there was a possibility.  The patient could try half of a Unisom over-the-counter to see if this helps with patient's insomnia.  Advised patient she may try half of a tablet of the Unisom.

## 2015-09-09 ENCOUNTER — Ambulatory Visit (HOSPITAL_BASED_OUTPATIENT_CLINIC_OR_DEPARTMENT_OTHER): Payer: Medicare Other

## 2015-09-09 VITALS — BP 100/59 | HR 71 | Temp 98.3°F | Resp 18

## 2015-09-09 DIAGNOSIS — E860 Dehydration: Secondary | ICD-10-CM

## 2015-09-09 MED ORDER — SODIUM CHLORIDE 0.9 % IV SOLN
INTRAVENOUS | Status: AC
  Administered 2015-09-09: 14:00:00 via INTRAVENOUS

## 2015-09-09 NOTE — Patient Instructions (Signed)

## 2015-09-25 ENCOUNTER — Ambulatory Visit (HOSPITAL_BASED_OUTPATIENT_CLINIC_OR_DEPARTMENT_OTHER): Payer: Medicare Other | Admitting: Hematology

## 2015-09-25 ENCOUNTER — Telehealth: Payer: Self-pay | Admitting: Hematology

## 2015-09-25 ENCOUNTER — Encounter: Payer: Self-pay | Admitting: Hematology

## 2015-09-25 ENCOUNTER — Ambulatory Visit (HOSPITAL_BASED_OUTPATIENT_CLINIC_OR_DEPARTMENT_OTHER): Payer: Medicare Other

## 2015-09-25 ENCOUNTER — Other Ambulatory Visit: Payer: Medicare Other

## 2015-09-25 ENCOUNTER — Ambulatory Visit: Payer: Medicare Other | Admitting: Hematology

## 2015-09-25 ENCOUNTER — Other Ambulatory Visit (HOSPITAL_BASED_OUTPATIENT_CLINIC_OR_DEPARTMENT_OTHER): Payer: Medicare Other

## 2015-09-25 VITALS — BP 114/50 | HR 70

## 2015-09-25 VITALS — BP 109/47 | HR 73 | Temp 98.3°F | Resp 18 | Ht 63.0 in | Wt 128.7 lb

## 2015-09-25 DIAGNOSIS — R109 Unspecified abdominal pain: Secondary | ICD-10-CM | POA: Diagnosis not present

## 2015-09-25 DIAGNOSIS — R530 Neoplastic (malignant) related fatigue: Secondary | ICD-10-CM | POA: Diagnosis not present

## 2015-09-25 DIAGNOSIS — Z5112 Encounter for antineoplastic immunotherapy: Secondary | ICD-10-CM | POA: Diagnosis present

## 2015-09-25 DIAGNOSIS — E46 Unspecified protein-calorie malnutrition: Secondary | ICD-10-CM

## 2015-09-25 DIAGNOSIS — C187 Malignant neoplasm of sigmoid colon: Secondary | ICD-10-CM

## 2015-09-25 DIAGNOSIS — C186 Malignant neoplasm of descending colon: Secondary | ICD-10-CM

## 2015-09-25 DIAGNOSIS — G4700 Insomnia, unspecified: Secondary | ICD-10-CM | POA: Diagnosis not present

## 2015-09-25 LAB — CBC WITH DIFFERENTIAL/PLATELET
BASO%: 1.1 % (ref 0.0–2.0)
BASOS ABS: 0.1 10*3/uL (ref 0.0–0.1)
EOS ABS: 0.4 10*3/uL (ref 0.0–0.5)
EOS%: 5.9 % (ref 0.0–7.0)
HEMATOCRIT: 35.3 % (ref 34.8–46.6)
HGB: 11.6 g/dL (ref 11.6–15.9)
LYMPH%: 24.6 % (ref 14.0–49.7)
MCH: 27.6 pg (ref 25.1–34.0)
MCHC: 32.9 g/dL (ref 31.5–36.0)
MCV: 83.9 fL (ref 79.5–101.0)
MONO#: 0.7 10*3/uL (ref 0.1–0.9)
MONO%: 9.1 % (ref 0.0–14.0)
NEUT#: 4.3 10*3/uL (ref 1.5–6.5)
NEUT%: 59.3 % (ref 38.4–76.8)
PLATELETS: 375 10*3/uL (ref 145–400)
RBC: 4.21 10*6/uL (ref 3.70–5.45)
RDW: 15.1 % — ABNORMAL HIGH (ref 11.2–14.5)
WBC: 7.3 10*3/uL (ref 3.9–10.3)
lymph#: 1.8 10*3/uL (ref 0.9–3.3)

## 2015-09-25 LAB — COMPREHENSIVE METABOLIC PANEL
ALBUMIN: 3.2 g/dL — AB (ref 3.5–5.0)
ALT: <9 U/L (ref 0–55)
AST: 9 U/L (ref 5–34)
Alkaline Phosphatase: 91 U/L (ref 40–150)
Anion Gap: 10 mEq/L (ref 3–11)
BUN: 14.8 mg/dL (ref 7.0–26.0)
CHLORIDE: 106 meq/L (ref 98–109)
CO2: 22 meq/L (ref 22–29)
Calcium: 9.4 mg/dL (ref 8.4–10.4)
Creatinine: 1 mg/dL (ref 0.6–1.1)
EGFR: 50 mL/min/{1.73_m2} — ABNORMAL LOW (ref >90)
GLUCOSE: 91 mg/dL (ref 70–140)
POTASSIUM: 4.5 meq/L (ref 3.5–5.1)
SODIUM: 138 meq/L (ref 136–145)
Total Bilirubin: 0.36 mg/dL (ref 0.20–1.20)
Total Protein: 7.5 g/dL (ref 6.4–8.3)

## 2015-09-25 LAB — TSH: TSH: 0.986 m[IU]/L (ref 0.308–3.960)

## 2015-09-25 MED ORDER — METHYLPREDNISOLONE SODIUM SUCC 40 MG IJ SOLR
40.0000 mg | Freq: Once | INTRAMUSCULAR | Status: AC
  Administered 2015-09-25: 40 mg via INTRAVENOUS

## 2015-09-25 MED ORDER — METHYLPREDNISOLONE SODIUM SUCC 40 MG IJ SOLR
INTRAMUSCULAR | Status: AC
  Filled 2015-09-25: qty 1

## 2015-09-25 MED ORDER — HEPARIN SOD (PORK) LOCK FLUSH 100 UNIT/ML IV SOLN
500.0000 [IU] | Freq: Once | INTRAVENOUS | Status: DC | PRN
  Filled 2015-09-25: qty 5

## 2015-09-25 MED ORDER — SODIUM CHLORIDE 0.9% FLUSH
10.0000 mL | INTRAVENOUS | Status: DC | PRN
  Filled 2015-09-25: qty 10

## 2015-09-25 MED ORDER — SODIUM CHLORIDE 0.9 % IV SOLN
2.0000 mg/kg | Freq: Once | INTRAVENOUS | Status: AC
  Administered 2015-09-25: 125 mg via INTRAVENOUS
  Filled 2015-09-25: qty 5

## 2015-09-25 MED ORDER — TRAMADOL HCL 50 MG PO TABS: 50.0000 mg | ORAL_TABLET | Freq: Three times a day (TID) | ORAL | Status: DC | PRN

## 2015-09-25 MED ORDER — SODIUM CHLORIDE 0.9 % IV SOLN
Freq: Once | INTRAVENOUS | Status: AC
  Administered 2015-09-25: 13:00:00 via INTRAVENOUS

## 2015-09-25 MED ORDER — SODIUM CHLORIDE 0.9 % IV SOLN
500.0000 mL | Freq: Once | INTRAVENOUS | Status: AC
  Administered 2015-09-25: 500 mL via INTRAVENOUS

## 2015-09-25 MED ORDER — SODIUM CHLORIDE 0.9 % IV SOLN: Freq: Once | INTRAVENOUS | Status: DC

## 2015-09-25 NOTE — Progress Notes (Signed)
Asymptomatic post IV fluids.

## 2015-09-25 NOTE — Progress Notes (Signed)
1245- Pt reports of feeling fatigue and "not feeling so well today" Pt states that she hasn't been eating and drinking well either. Notified [name] of pt complaints. MD ordered pt to have additional 559ml of NS with her [place] today.

## 2015-09-25 NOTE — Progress Notes (Signed)
[place]  [phone] [phone]  Clinic Follow up Note   Patient Care Team: [name], MD as PCP - General (Internal Medicine) Truitt Merle, MD as Consulting Physician (Hematology) Erroll Luna, MD as Consulting Physician (General Surgery) Milus Banister, MD as Attending Physician (Gastroenterology) 09/25/2015   CHIEF COMPLAINTS:  Follow up metastatic colon cancer   HISTORY OF PRESENTING ILLNESS(05/18/2015):  Joann Herrera 82 y.o. female is here because of recently diagnosed colon cancer. She is accompanied by her daughter and son to the clinic today.  She has had upper abdominal pain and intermittent nausea and vomiting for almost 2 years, she had multiple ED visit and hospitalization. She describes her pain is located in the epigastric area, usually happens after meals, 5/10 , radiates to back, with associated nausea and vomiting. She has moderate fatigue, she is no energy to do much activity except self cares. She has intermittent dizziness, especially when she stands up. Her appetite is low, she eats small meals. she had intermittent diarrhea for a few years with lossoe BM, but recently developed mild to moderate constipation, she lasot about 15 lbs in the past few years   She was referred to gastroenterologist Dr. Ardis Hughs, and underwent EGD on 01/22/2015, which was unremarkable. Abdominal ultrasound on 03/17/2015 showed gallbladder stone, and a 3.3 cm hypoechoic mass in the portal hepatitis. She subsequently underwent abdominal MRI on 04/30/2015, which showed a 2 cm short axis node in the porta hepatis and additional 1.4 cm port cable node, with central necrosis. She underwent a colonoscopy on 04/30/2015, which showed multiple polyps and a malignant appearing mass in the sigmoid colon, the biopsy confirmed adenocarcinoma. She was seen by surgeon Dr. Brantley Stage.  CURRENT THERAPY: Keytruda '200mg'$  iv every 3 weeks started on 07/08/2015, dose reduced to  '2mg'$ /kg from cycle 2 on 08/31/15 due to severe reaction after first infusion  INTERIM HISTORY: Joann Herrera returns for follow-up. She is accompanied by her son Joann Herrera.  She tolerated the cycle 2 Keytruda moderately well,  She did not have worening fatigue,low appetite, and came in for IV fluids one week after treatment. Her daughter went back to [place] a week ago, she lives at home by herself now, her 2 sons stop by every day and stay over night on weekends. She is still quite fatigued, able to take care of herself, move around in the house, but does not go out for walks. She has moderate abdominal pain, stable overall, she takes tramadol 3 times a day. She takes Zofran for occasional nausea, she eats small meals, and drinks ensure 2-3 bottles a day. She lost a few pounds in the past 3 weeks.  MEDICAL HISTORY:  Past Medical History  Diagnosis Date  . Pneumonia 04/2012  . Rheumatoid Arthritis   . Macular degeneration   . Complication of anesthesia     "morphine caused severe shakes"  . PONV (postoperative nausea and vomiting)   . GERD (gastroesophageal reflux disease)   . Cancer (HCC)     squamous cell  . Colon carcinoma ([facility])     dx. left Colon cancer,evidence of metastasis    SURGICAL HISTORY: Past Surgical History  Procedure Laterality Date  . Total knee arthroplasty      x 2  . Hemorrhoid surgery    . Cataracts    . Hand surgery      Pt states she had both hands operated on for arthritis.  . Shoulder surgery Left     arthroscopy  .  Total knee arthroplasty Right   . Total knee arthroplasty Left   . Hand surgery    . Cataract extraction, bilateral    . Tonsillectomy    . Shoulder arthroscopy Left   . Minor hemorrhoidectomy    . Esophagogastroduodenoscopy N/A 01/22/2015    Procedure: ESOPHAGOGASTRODUODENOSCOPY (EGD);  Surgeon: Milus Banister, MD;  Location: [facility];  Service: Endoscopy;  Laterality: N/A;  . Joint replacement    . Eye surgery      bilateral cataracts  .  Colonoscopy with propofol N/A 04/30/2015    Procedure: COLONOSCOPY WITH PROPOFOL;  Surgeon: Milus Banister, MD;  Location: [facility];  Service: Endoscopy;  Laterality: N/A;    SOCIAL HISTORY: Social History   Social History  . Marital Status: Widowed    Spouse Name: N/A  . Number of Children: 3  . Years of Education: N/A   Occupational History  . Retired    Social History Main Topics  . Smoking status: Current Every Day Smoker -- 1.00 packs/day  . Smokeless tobacco: Never Used  . Alcohol Use: No  . Drug Use: No  . Sexual Activity: Not on file   Other Topics Concern  . Not on file   Social History Narrative   ** Merged History Encounter **       Daughter in [place]) [phone]   Son in [place]   Son in [place]   Retired- Medical sales representative business, worked at [employer] for 28 years   Enjoys dancing and going to [place]   Completed 11th grade   Very poor vision due to macular degeneration          FAMILY HISTORY: Family History  Problem Relation Age of Onset  . Arthritis Mother   . Arthritis-Osteo Mother   . Cancer Sister 15    breast  . Diabetes Sister   . Diabetes Brother   . Suicidality Brother   . Diabetes Brother   . Breast cancer Sister   . Cancer Maternal Aunt     breast cancer     ALLERGIES:  is allergic to aspirin; codeine; morphine and related; and penicillins.  MEDICATIONS:  Current Outpatient Prescriptions  Medication Sig Dispense Refill  . acetaminophen (TYLENOL) 500 MG tablet Take 1,000 mg by mouth every 6 (six) hours as needed. For pain    . ondansetron (ZOFRAN) 8 MG tablet Take 1 tablet (8 mg total) by mouth every 8 (eight) hours as needed for nausea. May fill with ODT if cost not prohibitive. 30 tablet 3  . traMADol (ULTRAM) 50 MG tablet Take 1 tablet (50 mg total) by mouth every 8 (eight) hours as needed. 90 tablet 0  . prochlorperazine (COMPAZINE) 10 MG tablet Take 1 tablet (10 mg total)  by mouth every 6 (six) hours as needed. (Patient not taking: Reported on 08/31/2015) 30 tablet 3   No current facility-administered medications for this visit.   Facility-Administered Medications Ordered in Other Visits  Medication Dose Route Frequency Provider Last Rate Last Dose  . 0.9 %  sodium chloride infusion   Intravenous Once Truitt Merle, MD      . heparin lock flush 100 unit/mL  500 Units Intracatheter Once PRN Truitt Merle, MD      . methylPREDNISolone sodium succinate (SOLU-MEDROL) 40 mg/mL injection 40 mg  40 mg Intravenous Once Truitt Merle, MD      . pembrolizumab [facility]) 125 mg in sodium chloride 0.9 % 50 mL chemo  infusion  2 mg/kg (Treatment Plan Actual) Intravenous Once Truitt Merle, MD      . sodium chloride flush (NS) 0.9 % injection 10 mL  10 mL Intracatheter PRN Truitt Merle, MD        REVIEW OF SYSTEMS:   Constitutional: Denies fevers, chills or abnormal night sweats Eyes: Denies blurriness of vision, double vision or watery eyes Ears, nose, mouth, throat, and face: Denies mucositis or sore throat Respiratory: Denies cough, dyspnea or wheezes Cardiovascular: Denies palpitation, chest discomfort or lower extremity swelling Gastrointestinal:  Denies nausea, heartburn or change in bowel habits Skin: Denies abnormal skin rashes Lymphatics: Denies new lymphadenopathy or easy bruising Neurological:Denies numbness, tingling or new weaknesses Behavioral/Psych: Mood is stable, no new changes  All other systems were reviewed with the patient and are negative.  PHYSICAL EXAMINATION: ECOG PERFORMANCE STATUS: 3  Filed Vitals:   09/25/15 1132  BP: 109/47  Pulse: 73  Temp: 98.3 F (36.8 C)  Resp: 18   Filed Weights   09/25/15 1132  Weight: 128 lb 11.2 oz (58.378 kg)    GENERAL:alert, no distress and comfortable SKIN: skin color, texture, turgor are normal, no rashes or significant lesions EYES: normal, conjunctiva are pink and non-injected, sclera clear OROPHARYNX:no exudate, no  erythema and lips, buccal mucosa, and tongue normal  NECK: supple, thyroid normal size, non-tender, without nodularity LYMPH:  no palpable lymphadenopathy in the cervical, axillary or inguinal LUNGS: clear to auscultation and percussion with normal breathing effort HEART: regular rate & rhythm and no murmurs and no lower extremity edema ABDOMEN:abdomen soft, mild tenderness at RUQ and epigastric area, normal bowel sounds Musculoskeletal:no cyanosis of digits and no clubbing  PSYCH: alert & oriented x 3 with fluent speech NEURO: no focal motor/sensory deficits  LABORATORY DATA:  I have reviewed the data as listed CBC Latest Ref Rng 09/25/2015 09/08/2015 08/31/2015  WBC 3.9 - 10.3 10e3/uL 7.3 6.8 8.3  Hemoglobin 11.6 - 15.9 g/dL 11.6 12.1 11.8  Hematocrit 34.8 - 46.6 % 35.3 37.3 36.0  Platelets 145 - 400 10e3/uL 375 369 317    CMP Latest Ref Rng 09/25/2015 09/08/2015 08/31/2015  Glucose 70 - 140 mg/dl 91 102 90  BUN 7.0 - 26.0 mg/dL 14.8 18.4 21.7  Creatinine 0.6 - 1.1 mg/dL 1.0 1.0 1.0  Sodium 136 - 145 mEq/L 138 138 138  Potassium 3.5 - 5.1 mEq/L 4.5 4.1 4.5  CO2 22 - 29 mEq/L '22 24 24  '$ Calcium 8.4 - 10.4 mg/dL 9.4 9.2 9.2  Total Protein 6.4 - 8.3 g/dL 7.5 7.4 7.2  Total Bilirubin 0.20 - 1.20 mg/dL 0.36 <0.30 <0.30  Alkaline Phos 40 - 150 U/L 91 82 73  AST 5 - 34 U/L '9 12 10  '$ ALT 0 - 55 U/L '9 10 9    '$ PATHOLOGY REPORT  Diagnosis 04/29/2014 1. Colon, biopsy, ascending - TUBULAR ADENOMA. NO HIGH GRADE DYSPLASIA OR MALIGNANCY IDENTIFIED. 2. Colon, biopsy, 35 cm sigmoid mass r/o malignancy - POORLY DIFFERENTIATED INVASIVE ADENOCARCINOMA. 3. Colon, polyp(s), sigmoid - TUBULAR ADENOMA. NO HIGH GRADE DYSPLASIA OR MALIGNANCY IDENTIFIED.   RADIOGRAPHIC STUDIES: I have personally reviewed the radiological images as listed and agreed with the findings in the report. No results found.   Colonoscopy 04/30/2015 Dr. Ardis Hughs  1. Three polyps were found, removed, two were sent to pathology (jar  1). 2. 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 This was located in proximal sigmoid colon (35-40cm from anus), was biopsied extensively (jar  2) and then the site was labeled with submucosal SPOT injection. 3. There was a large polypoid lesion in the distal sigmoid (15-17cm from anus) that was 3.5cm across, fairly soft but clearly too large for endoscopic resection. This was extensively biopsied (jar 3) and also labeled with submcosal injection of SPOT. 4. There were two to three small sessile satelite polyps located just distal to the distal sigmoid mass (not removed). There were numerous diverticulum in the left colon. The examination was otherwise normal   EGD ENDOSCOPIC IMPRESSION: 01/22/2015 There was mild, pan gastric atrophic changes (atrophic gastritis). This was biopsied and sent to pathology. There was a 2cm hiatal hernia. The examination was otherwise normal  RECOMMENDATIONS: If biopsies are positive for H. pylori, will recommend that you restart you antibiotics and will prophylax with scheduled antinausea medicines.  RECOMMENDATIONS: Await final pathology. MRI/MRCP pending as well (to evaluate the periportal lesion). My office will arrange referral to medical oncology and general surgery. The periportal lesion may be a site of distant metastasis or be a second issue. May require further evaluation after MRI (PET scan vs. EUS).    ASSESSMENT & PLAN:  82 year old female, with past medical history of rheumatoid arthritis, macular degeneration, and squamous cell skin cancer, presented with epigastric pain, nausea and weight loss.  1. Sigmoid colon cancer, cTxNxM1, clinical stage IV with periportal node metastases, poorly differentiated adenocarcinoma, MSI-high  -I previously reviewed her colonoscopy findings and pathology results from the biopsies. -She has no significant regional lymphadenopathy, but has two large necrotic appearing  periportal nodes, which are very suspicious for metastasis. -I reviewed her PET scan findings with patient and her family members, which showed hypermetabolic periportal lymph nodes and sigmoid colon mass, no other metastasis. No other primary tumor was seen on the PET scan, I think the PET portal lymphadenopathy are likely distant metastasis from her colon cancer. -She previously declined hospice -She is on first line Keytruda, dosed reduced from cycle 2 due to severe reaction to first cycle. She tolerated the low dose better. -She is clinically stable, lab reviewed, adequate for treatment, will proceed cycle 3 today -Repeat PET scan in 2 weeks. If disease progression, I will stop acute or and off supportive care alone.  2. Abdominal pain -continue tramadol as needed. I refilled today  - she refuses narcotics, she had bad experience with morphine and codein in the past.  3. Nausea and vomiting -continue Zofran as need, she did not tolerate the Compazine well, had dizziness  4. Fatigue and malnutrition -I encouraged her to continue ensure 2-3 bottles a day -follow up with dietitian    5. Insomnia -I encouraged her to try Benadryl, melatonin over-the-counter first -Due to her age and sensitivity to medication, I'm little hesitated to prescribe any prescription medication for her insomnia   Plan: -Lab reviewed, adequate for treatment, we'll proceed cycle 3 Kaytruda, with low dose at 125 mg, and solumedro 40 mg as premeds. She will also take Benadryl tonight -I will see her back in 3 weeks with a restaging PET (or CT CAP with contrast if PET denied by insurance) -she will call me next week if she needs IV fluids or needs to be seen.   All questions were answered. The patient knows to call the clinic with any problems, questions or concerns.  I spent 25 minutes counseling the patient face to face. The total time spent in the appointment was 30 minutes and more than 50% was on counseling.  Truitt Merle, MD 09/25/2015

## 2015-09-25 NOTE — Patient Instructions (Signed)
[facility] Discharge Instructions for Patients Receiving Chemotherapy  Today you received the following chemotherapy agents: Keytruda. To help prevent nausea and vomiting after your treatment, we encourage you to take your nausea medication. If you develop nausea and vomiting that is not controlled by your nausea medication, call the clinic.   BELOW ARE SYMPTOMS THAT SHOULD BE REPORTED IMMEDIATELY:  *FEVER GREATER THAN 100.5 F  *CHILLS WITH OR WITHOUT FEVER  NAUSEA AND VOMITING THAT IS NOT CONTROLLED WITH YOUR NAUSEA MEDICATION  *UNUSUAL SHORTNESS OF BREATH  *UNUSUAL BRUISING OR BLEEDING  TENDERNESS IN MOUTH AND THROAT WITH OR WITHOUT PRESENCE OF ULCERS  *URINARY PROBLEMS  *BOWEL PROBLEMS  UNUSUAL RASH Items with * indicate a potential emergency and should be followed up as soon as possible.  Feel free to call the clinic you have any questions or concerns. The clinic phone number is [phone].  Please show the [facility] at check-in to the Emergency Department and triage nurse.   Dehydration, Adult Dehydration is a condition in which you do not have enough fluid or water in your body. It happens when you take in less fluid than you lose. Vital organs such as the kidneys, brain, and heart cannot function without a proper amount of fluids. Any loss of fluids from the body can cause dehydration.  Dehydration can range from mild to severe. This condition should be treated right away to help prevent it from becoming severe. CAUSES  This condition may be caused by:  Vomiting.  Diarrhea.  Excessive sweating, such as when exercising in hot or humid weather.  Not drinking enough fluid during strenuous exercise or during an illness.  Excessive urine output.  Fever.  Certain medicines. RISK FACTORS This condition is more likely to develop in:  People who are taking certain medicines that cause the body to lose excess fluid (diuretics).   People  who have a chronic illness, such as diabetes, that may increase urination.  Older adults.   People who live at high altitudes.   People who participate in endurance sports.  SYMPTOMS  Mild Dehydration  Thirst.  Dry lips.  Slightly dry mouth.  Dry, warm skin. Moderate Dehydration  Very dry mouth.   Muscle cramps.   Dark urine and decreased urine production.   Decreased tear production.   Headache.   Light-headedness, especially when you stand up from a sitting position.  Severe Dehydration  Changes in skin.   Cold and clammy skin.   Skin does not spring back quickly when lightly pinched and released.   Changes in body fluids.   Extreme thirst.   No tears.   Not able to sweat when body temperature is high, such as in hot weather.   Minimal urine production.   Changes in vital signs.   Rapid, weak pulse (more than 100 beats per minute when you are sitting still).   Rapid breathing.   Low blood pressure.   Other changes.   Sunken eyes.   Cold hands and feet.   Confusion.  Lethargy and difficulty being awakened.  Fainting (syncope).   Short-term weight loss.   Unconsciousness. DIAGNOSIS  This condition may be diagnosed based on your symptoms. You may also have tests to determine how severe your dehydration is. These tests may include:   Urine tests.   Blood tests.  TREATMENT  Treatment for this condition depends on the severity. Mild or moderate dehydration can often be treated at home. Treatment should be started right  away. Do not wait until dehydration becomes severe. Severe dehydration needs to be treated at the hospital. Treatment for Mild Dehydration  Drinking plenty of water to replace the fluid you have lost.   Replacing minerals in your blood (electrolytes) that you may have lost.  Treatment for Moderate Dehydration  Consuming oral rehydration solution (ORS). Treatment for Severe  Dehydration  Receiving fluid through an IV tube.   Receiving electrolyte solution through a feeding tube that is passed through your nose and into your stomach (nasogastric tube or NG tube).  Correcting any abnormalities in electrolytes. HOME CARE INSTRUCTIONS   Drink enough fluid to keep your urine clear or pale yellow.   Drink water or fluid slowly by taking small sips. You can also try sucking on ice cubes.  Have food or beverages that contain electrolytes. Examples include bananas and sports drinks.  Take over-the-counter and prescription medicines only as told by your health care provider.   Prepare ORS according to the manufacturer's instructions. Take sips of ORS every 5 minutes until your urine returns to normal.  If you have vomiting or diarrhea, continue to try to drink water, ORS, or both.   If you have diarrhea, avoid:   Beverages that contain caffeine.   Fruit juice.   Milk.   Carbonated soft drinks.  Do not take salt tablets. This can lead to the condition of having too much sodium in your body (hypernatremia).  SEEK MEDICAL CARE IF:  You cannot eat or drink without vomiting.  You have had moderate diarrhea during a period of more than 24 hours.  You have a fever. SEEK IMMEDIATE MEDICAL CARE IF:   You have extreme thirst.  You have severe diarrhea.  You have not urinated in 6-8 hours, or you have urinated only a small amount of very dark urine.  You have shriveled skin.  You are dizzy, confused, or both.   This information is not intended to replace advice given to you by your health care provider. Make sure you discuss any questions you have with your health care provider.   Document Released: 04/11/2005 Document Revised: 12/31/2014 Document Reviewed: 08/27/2014 Elsevier Interactive Patient Education [employer].

## 2015-09-25 NOTE — Telephone Encounter (Signed)
Pt will p/u new sched in tx room

## 2015-10-07 MED FILL — traMADol HCL 50 MG TABS: 50 | 30 days supply | Qty: 90 | Fill #0

## 2015-10-16 ENCOUNTER — Ambulatory Visit (HOSPITAL_COMMUNITY)
Admission: RE | Admit: 2015-10-16 | Discharge: 2015-10-16 | Disposition: A | Discharge status: Home / Self Care | Payer: Medicare Other | Source: Ambulatory Visit | Attending: Hematology | Admitting: Hematology

## 2015-10-16 DIAGNOSIS — R935 Abnormal findings on diagnostic imaging of other abdominal regions, including retroperitoneum: Secondary | ICD-10-CM | POA: Diagnosis not present

## 2015-10-16 DIAGNOSIS — I2510 Atherosclerotic heart disease of native coronary artery without angina pectoris: Secondary | ICD-10-CM | POA: Diagnosis not present

## 2015-10-16 DIAGNOSIS — C186 Malignant neoplasm of descending colon: Secondary | ICD-10-CM

## 2015-10-16 DIAGNOSIS — C189 Malignant neoplasm of colon, unspecified: Secondary | ICD-10-CM | POA: Diagnosis not present

## 2015-10-16 DIAGNOSIS — R590 Localized enlarged lymph nodes: Secondary | ICD-10-CM | POA: Diagnosis not present

## 2015-10-16 DIAGNOSIS — I700 Atherosclerosis of aorta: Secondary | ICD-10-CM | POA: Insufficient documentation

## 2015-10-16 LAB — GLUCOSE, CAPILLARY: Glucose-Capillary: 104 mg/dL — ABNORMAL HIGH (ref 65–99)

## 2015-10-16 MED ORDER — FLUDEOXYGLUCOSE F - 18 (FDG) INJECTION
6.4400 | Freq: Once | INTRAVENOUS | Status: AC | PRN
  Administered 2015-10-16: 6.44 via INTRAVENOUS

## 2015-10-20 ENCOUNTER — Ambulatory Visit (HOSPITAL_BASED_OUTPATIENT_CLINIC_OR_DEPARTMENT_OTHER): Payer: Medicare Other | Admitting: Hematology

## 2015-10-20 ENCOUNTER — Telehealth: Payer: Self-pay | Admitting: Hematology

## 2015-10-20 ENCOUNTER — Ambulatory Visit (HOSPITAL_BASED_OUTPATIENT_CLINIC_OR_DEPARTMENT_OTHER): Payer: Medicare Other

## 2015-10-20 ENCOUNTER — Encounter: Payer: Self-pay | Admitting: Hematology

## 2015-10-20 ENCOUNTER — Other Ambulatory Visit (HOSPITAL_BASED_OUTPATIENT_CLINIC_OR_DEPARTMENT_OTHER): Payer: Medicare Other

## 2015-10-20 VITALS — BP 122/88 | HR 62 | Temp 97.7°F | Resp 18 | Ht 63.0 in | Wt 134.9 lb

## 2015-10-20 DIAGNOSIS — Z79899 Other long term (current) drug therapy: Secondary | ICD-10-CM | POA: Diagnosis not present

## 2015-10-20 DIAGNOSIS — Z5112 Encounter for antineoplastic immunotherapy: Secondary | ICD-10-CM

## 2015-10-20 DIAGNOSIS — R112 Nausea with vomiting, unspecified: Secondary | ICD-10-CM

## 2015-10-20 DIAGNOSIS — R109 Unspecified abdominal pain: Secondary | ICD-10-CM

## 2015-10-20 DIAGNOSIS — C186 Malignant neoplasm of descending colon: Secondary | ICD-10-CM

## 2015-10-20 DIAGNOSIS — R530 Neoplastic (malignant) related fatigue: Secondary | ICD-10-CM

## 2015-10-20 DIAGNOSIS — E46 Unspecified protein-calorie malnutrition: Secondary | ICD-10-CM | POA: Diagnosis not present

## 2015-10-20 DIAGNOSIS — G4700 Insomnia, unspecified: Secondary | ICD-10-CM

## 2015-10-20 LAB — COMPREHENSIVE METABOLIC PANEL
ALBUMIN: 3.1 g/dL — AB (ref 3.5–5.0)
ALK PHOS: 82 U/L (ref 40–150)
ALT: <9 U/L (ref 0–55)
AST: 11 U/L (ref 5–34)
Anion Gap: 8 mEq/L (ref 3–11)
BUN: 14.3 mg/dL (ref 7.0–26.0)
CALCIUM: 8.7 mg/dL (ref 8.4–10.4)
CO2: 23 mEq/L (ref 22–29)
Chloride: 107 mEq/L (ref 98–109)
Creatinine: 0.9 mg/dL (ref 0.6–1.1)
EGFR: 59 mL/min/{1.73_m2} — ABNORMAL LOW (ref >90)
Glucose: 92 mg/dl (ref 70–140)
POTASSIUM: 4.1 meq/L (ref 3.5–5.1)
SODIUM: 138 meq/L (ref 136–145)
TOTAL PROTEIN: 6.6 g/dL (ref 6.4–8.3)

## 2015-10-20 LAB — CBC WITH DIFFERENTIAL/PLATELET
BASO%: 0.9 % (ref 0.0–2.0)
Basophils Absolute: 0.1 10*3/uL (ref 0.0–0.1)
EOS%: 7.1 % — AB (ref 0.0–7.0)
Eosinophils Absolute: 0.4 10*3/uL (ref 0.0–0.5)
HCT: 32.6 % — ABNORMAL LOW (ref 34.8–46.6)
HGB: 10.7 g/dL — ABNORMAL LOW (ref 11.6–15.9)
LYMPH%: 23.3 % (ref 14.0–49.7)
MCH: 27.6 pg (ref 25.1–34.0)
MCHC: 32.9 g/dL (ref 31.5–36.0)
MCV: 84 fL (ref 79.5–101.0)
MONO#: 0.5 10*3/uL (ref 0.1–0.9)
MONO%: 8.6 % (ref 0.0–14.0)
NEUT%: 60.1 % (ref 38.4–76.8)
NEUTROS ABS: 3.6 10*3/uL (ref 1.5–6.5)
PLATELETS: 312 10*3/uL (ref 145–400)
RBC: 3.88 10*6/uL (ref 3.70–5.45)
RDW: 15.8 % — ABNORMAL HIGH (ref 11.2–14.5)
WBC: 6 10*3/uL (ref 3.9–10.3)
lymph#: 1.4 10*3/uL (ref 0.9–3.3)

## 2015-10-20 LAB — TSH: TSH: 2.919 m[IU]/L (ref 0.308–3.960)

## 2015-10-20 MED ORDER — SODIUM CHLORIDE 0.9 % IV SOLN
Freq: Once | INTRAVENOUS | Status: AC
  Administered 2015-10-20: 13:00:00 via INTRAVENOUS

## 2015-10-20 MED ORDER — SODIUM CHLORIDE 0.9 % IV SOLN
2.0000 mg/kg | Freq: Once | INTRAVENOUS | Status: AC
  Administered 2015-10-20: 125 mg via INTRAVENOUS
  Filled 2015-10-20: qty 5

## 2015-10-20 MED ORDER — METHYLPREDNISOLONE SODIUM SUCC 40 MG IJ SOLR
40.0000 mg | Freq: Once | INTRAMUSCULAR | Status: AC
  Administered 2015-10-20: 40 mg via INTRAVENOUS

## 2015-10-20 MED ORDER — MIRTAZAPINE 15 MG PO TABS: 15.0000 mg | ORAL_TABLET | Freq: Every day | ORAL | Status: DC

## 2015-10-20 MED ORDER — SODIUM CHLORIDE 0.9 % IV SOLN
INTRAVENOUS | Status: AC
  Administered 2015-10-20: 13:00:00 via INTRAVENOUS

## 2015-10-20 MED ORDER — METHYLPREDNISOLONE SODIUM SUCC 40 MG IJ SOLR
INTRAMUSCULAR | Status: AC
  Filled 2015-10-20: qty 1

## 2015-10-20 MED FILL — MIRTAZAPINE 15 MG TABLET: 15 | 30 days supply | Qty: 30 | Fill #0

## 2015-10-20 NOTE — Progress Notes (Signed)
[facility]  Telephone:([phone]) [phone] Fax:([phone]) [phone]  Clinic Follow up Note   Patient Care Team: [name], MD as PCP - General (Internal Medicine) Truitt Merle, MD as Consulting Physician (Hematology) Erroll Luna, MD as Consulting Physician (General Surgery) Milus Banister, MD as Attending Physician (Gastroenterology) 10/20/2015   CHIEF COMPLAINTS:  Follow up metastatic colon cancer   HISTORY OF PRESENTING ILLNESS(05/18/2015):  Joann Herrera 82 y.o. female is here because of recently diagnosed colon cancer. She is accompanied by her daughter and son to the clinic today.  She has had upper abdominal pain and intermittent nausea and vomiting for almost 2 years, she had multiple ED visit and hospitalization. She describes her pain is located in the epigastric area, usually happens after meals, 5/10 , radiates to back, with associated nausea and vomiting. She has moderate fatigue, she is no energy to do much activity except self cares. She has intermittent dizziness, especially when she stands up. Her appetite is low, she eats small meals. she had intermittent diarrhea for a few years with lossoe BM, but recently developed mild to moderate constipation, she lasot about 15 lbs in the past few years   She was referred to gastroenterologist Dr. Ardis Hughs, and underwent EGD on 01/22/2015, which was unremarkable. Abdominal ultrasound on 03/17/2015 showed gallbladder stone, and a 3.3 cm hypoechoic mass in the portal hepatitis. She subsequently underwent abdominal MRI on 04/30/2015, which showed a 2 cm short axis node in the porta hepatis and additional 1.4 cm port cable node, with central necrosis. She underwent a colonoscopy on 04/30/2015, which showed multiple polyps and a malignant appearing mass in the sigmoid colon, the biopsy confirmed adenocarcinoma. She was seen by surgeon Dr. Brantley Stage.  CURRENT THERAPY: Keytruda '200mg'$  iv every 3 weeks started on 07/08/2015, dose reduced to  '2mg'$ /kg from cycle 2 on 08/31/15 due to severe reaction after first infusion  INTERIM HISTORY: Rajanae returns for follow-up. She is accompanied by her son and daughter. She states she has "good and bad days". She went to beach a few days ago and enjoyed it. She still has intermitternt abdominal pain, for which she takes tramadol. Her appetite is low overall, she eats small meals. She also complains of insomnia, wakes up early in the morning, difficulty falling asleep. Her weight has been stable lately.  MEDICAL HISTORY:  Past Medical History  Diagnosis Date  . Pneumonia 04/2012  . Rheumatoid Arthritis   . Macular degeneration   . Complication of anesthesia     "morphine caused severe shakes"  . PONV (postoperative nausea and vomiting)   . GERD (gastroesophageal reflux disease)   . Cancer (HCC)     squamous cell  . Colon carcinoma ([place])     dx. left Colon cancer,evidence of metastasis    SURGICAL HISTORY: Past Surgical History  Procedure Laterality Date  . Total knee arthroplasty      x 2  . Hemorrhoid surgery    . Cataracts    . Hand surgery      Pt states she had both hands operated on for arthritis.  . Shoulder surgery Left     arthroscopy  . Total knee arthroplasty Right   . Total knee arthroplasty Left   . Hand surgery    . Cataract extraction, bilateral    . Tonsillectomy    . Shoulder arthroscopy Left   . Minor hemorrhoidectomy    . Esophagogastroduodenoscopy N/A 01/22/2015    Procedure: ESOPHAGOGASTRODUODENOSCOPY (EGD);  Surgeon: Milus Banister,  MD;  Location: [facility];  Service: Endoscopy;  Laterality: N/A;  . Joint replacement    . Eye surgery      bilateral cataracts  . Colonoscopy with propofol N/A 04/30/2015    Procedure: COLONOSCOPY WITH PROPOFOL;  Surgeon: Milus Banister, MD;  Location: [facility];  Service: Endoscopy;  Laterality: N/A;    SOCIAL HISTORY: Social History   Social History  . Marital Status: Widowed    Spouse Name: N/A  . Number of  Children: 3  . Years of Education: N/A   Occupational History  . Retired    Social History Main Topics  . Smoking status: Current Every Day Smoker -- 1.00 packs/day  . Smokeless tobacco: Never Used  . Alcohol Use: No  . Drug Use: No  . Sexual Activity: Not on file   Other Topics Concern  . Not on file   Social History Narrative   ** Merged History Encounter **       Daughter in [place]) [phone]   Son in [place]   Son in [place]   Retired- Medical sales representative business, worked at [employer] for 28 years   Enjoys dancing and going to [place]   Completed 11th grade   Very poor vision due to macular degeneration          FAMILY HISTORY: Family History  Problem Relation Age of Onset  . Arthritis Mother   . Arthritis-Osteo Mother   . Cancer Sister 30    breast  . Diabetes Sister   . Diabetes Brother   . Suicidality Brother   . Diabetes Brother   . Breast cancer Sister   . Cancer Maternal Aunt     breast cancer     ALLERGIES:  is allergic to aspirin; codeine; morphine and related; and penicillins.  MEDICATIONS:  Current Outpatient Prescriptions  Medication Sig Dispense Refill  . acetaminophen (TYLENOL) 500 MG tablet Take 1,000 mg by mouth every 6 (six) hours as needed. For pain    . ondansetron (ZOFRAN) 8 MG tablet Take 1 tablet (8 mg total) by mouth every 8 (eight) hours as needed for nausea. May fill with ODT if cost not prohibitive. 30 tablet 3  . prochlorperazine (COMPAZINE) 10 MG tablet Take 1 tablet (10 mg total) by mouth every 6 (six) hours as needed. (Patient not taking: Reported on 08/31/2015) 30 tablet 3  . traMADol (ULTRAM) 50 MG tablet Take 1 tablet (50 mg total) by mouth every 8 (eight) hours as needed. 90 tablet 0   No current facility-administered medications for this visit.    REVIEW OF SYSTEMS:   Constitutional: Denies fevers, chills or abnormal night sweats Eyes: Denies blurriness of vision, double  vision or watery eyes Ears, nose, mouth, throat, and face: Denies mucositis or sore throat Respiratory: Denies cough, dyspnea or wheezes Cardiovascular: Denies palpitation, chest discomfort or lower extremity swelling Gastrointestinal:  Denies nausea, heartburn or change in bowel habits Skin: Denies abnormal skin rashes Lymphatics: Denies new lymphadenopathy or easy bruising Neurological:Denies numbness, tingling or new weaknesses Behavioral/Psych: Mood is stable, no new changes  All other systems were reviewed with the patient and are negative.  PHYSICAL EXAMINATION: ECOG PERFORMANCE STATUS: 3  Filed Vitals:   10/20/15 1127  BP: 122/88  Pulse: 62  Temp: 97.7 F (36.5 C)  Resp: 18   Filed Weights   10/20/15 1127  Weight: 134 lb 14.4 oz (61.19 kg)    GENERAL:alert, no distress and comfortable  SKIN: skin color, texture, turgor are normal, no rashes or significant lesions EYES: normal, conjunctiva are pink and non-injected, sclera clear OROPHARYNX:no exudate, no erythema and lips, buccal mucosa, and tongue normal  NECK: supple, thyroid normal size, non-tender, without nodularity LYMPH:  no palpable lymphadenopathy in the cervical, axillary or inguinal LUNGS: clear to auscultation and percussion with normal breathing effort HEART: regular rate & rhythm and no murmurs and no lower extremity edema ABDOMEN:abdomen soft, mild tenderness at RUQ and epigastric area, normal bowel sounds Musculoskeletal:no cyanosis of digits and no clubbing  PSYCH: alert & oriented x 3 with fluent speech NEURO: no focal motor/sensory deficits  LABORATORY DATA:  I have reviewed the data as listed CBC Latest Ref Rng 10/20/2015 09/25/2015 09/08/2015  WBC 3.9 - 10.3 10e3/uL 6.0 7.3 6.8  Hemoglobin 11.6 - 15.9 g/dL 10.7(L) 11.6 12.1  Hematocrit 34.8 - 46.6 % 32.6(L) 35.3 37.3  Platelets 145 - 400 10e3/uL 312 375 369    CMP Latest Ref Rng 10/20/2015 09/25/2015 09/08/2015  Glucose 70 - 140 mg/dl 92 91 102    BUN 7.0 - 26.0 mg/dL 14.3 14.8 18.4  Creatinine 0.6 - 1.1 mg/dL 0.9 1.0 1.0  Sodium 136 - 145 mEq/L 138 138 138  Potassium 3.5 - 5.1 mEq/L 4.1 4.5 4.1  CO2 22 - 29 mEq/L '23 22 24  '$ Calcium 8.4 - 10.4 mg/dL 8.7 9.4 9.2  Total Protein 6.4 - 8.3 g/dL 6.6 7.5 7.4  Total Bilirubin 0.20 - 1.20 mg/dL <0.30 0.36 <0.30  Alkaline Phos 40 - 150 U/L 82 91 82  AST 5 - 34 U/L '11 9 12  '$ ALT 0 - 55 U/L <9 <9 10    PATHOLOGY REPORT  Diagnosis 04/29/2014 1. Colon, biopsy, ascending - TUBULAR ADENOMA. NO HIGH GRADE DYSPLASIA OR MALIGNANCY IDENTIFIED. 2. Colon, biopsy, 35 cm sigmoid mass r/o malignancy - POORLY DIFFERENTIATED INVASIVE ADENOCARCINOMA. 3. Colon, polyp(s), sigmoid - TUBULAR ADENOMA. NO HIGH GRADE DYSPLASIA OR MALIGNANCY IDENTIFIED.   RADIOGRAPHIC STUDIES: I have personally reviewed the radiological images as listed and agreed with the findings in the report. Nm Pet Image Restag (ps) Skull Base To Thigh  10/16/2015  CLINICAL DATA:  Subsequent treatment strategy for colon cancer. EXAM: NUCLEAR MEDICINE PET SKULL BASE TO THIGH TECHNIQUE: 6.4 mCi F-18 FDG was injected intravenously. Full-ring PET imaging was performed from the skull base to thigh after the radiotracer. CT data was obtained and used for attenuation correction and anatomic localization. FASTING BLOOD GLUCOSE:  Value: 104 mg/dl COMPARISON:  05/27/2015 FINDINGS: NECK No hypermetabolic lymph nodes in the neck. CHEST There is a 9 mm right paratracheal lymph node which has an SUV max equal to 3.77. On the previous exam this node measured 5 mm and had an SUV max equal to 1.7. Aortic atherosclerosis noted. Calcification within the RCA and LAD coronary artery noted. Moderate changes of centrilobular and paraseptal emphysema. No hypermetabolic pulmonary nodules or masses identified. No suspicious pulmonary nodules on the CT scan. ABDOMEN/PELVIS No abnormal hypermetabolic activity within the liver, pancreas, adrenal glands, or spleen.  Hypermetabolic upper abdominal lymph nodes are again identified. Index peripancreatic lymph node measures 1.3 cm and has an SUV max equal to 13.2. Previously this measured 2.4 cm and had an SUV max equal to 13.26. Portacaval lymph node Measures 1.9 cm and has an SUV max equal to 12.59. Previously this measured 1.9 cm and had an SUV max equal to 16.27. No new or progressive hypermetabolic adenopathy within the abdomen or pelvis. Nonspecific focus of increased  uptake within the sigmoid colon is identified within SUV max equal to 25.08. SKELETON No evidence for hypermetabolic bone metastases. IMPRESSION: 1. Persistent enlarged and intensely hypermetabolic upper abdominal lymph nodes are identified compatible with metastatic adenopathy. When compared with the previous exam the appearance is not significantly changed. 2. Mild increase in size and FDG uptake associated with the right paratracheal lymph node. This is a nonspecific finding but warrants attention on follow-up imaging. 3. Nonspecific focus of intense increased uptake noted within the sigmoid colon. Although likely physiologic correlation with colon cancer screening techniques advise. 4. Aortic atherosclerosis and multi vessel coronary artery calcifications. Electronically Signed   By: Kerby Moors M.D.   On: 10/16/2015 12:55    ASSESSMENT & PLAN:  82 year old female, with past medical history of rheumatoid arthritis, macular degeneration, and squamous cell skin cancer, presented with epigastric pain, nausea and weight loss.  1. Sigmoid colon cancer, cTxNxM1, clinical stage IV with periportal node metastases, poorly differentiated adenocarcinoma, MSI-high  -I previously reviewed her colonoscopy findings and pathology results from the biopsies. -She has no significant regional lymphadenopathy, but has two large necrotic appearing periportal nodes, which are very suspicious for metastasis. -I reviewed her PET scan findings with patient and her family  members, which showed hypermetabolic periportal lymph nodes and sigmoid colon mass, no other metastasis. No other primary tumor was seen on the PET scan, I think the PET portal lymphadenopathy are likely distant metastasis from her colon cancer. -She previously declined hospice -She is on first line Keytruda, dosed reduced from cycle 2 due to severe reaction to first cycle. She tolerated the low dose better. -I discussed her restaging CT scan findings from 10/16/2015, which showed a stable disease overall. No new lesions. -She is clinically stable, wished to continue current treatment. lab reviewed, adequate for treatment, will proceed cycle 4 today  2. Abdominal pain -continue tramadol as needed.  - she refuses narcotics, she had bad experience with morphine and codein in the past.  3. Nausea and vomiting -continue Zofran as need, she did not tolerate the Compazine well, had dizziness  4. Fatigue and malnutrition -I encouraged her to continue ensure 2-3 bottles a day -follow up with dietitian    5. Insomnia -I encouraged her to try mirtazapine, which is also for her low appetite and depression. Potential benefit and side effects discussed with patient, she agrees to try -I called in mirtazapine 15 mg daily at bed time  Plan: -Lab reviewed, adequate for treatment, we'll proceed cycle 4 Kaytruda, with same low dose at '2mg'$ /kg  -I will set up IVF in 3 days, she knows to call us next week if needs IVF -I'll see her back in 3 weeks  All questions were answered. The patient knows to call the clinic with any problems, questions or concerns.  I spent 25 minutes counseling the patient face to face. The total time spent in the appointment was 30 minutes and more than 50% was on counseling.     Truitt Merle, MD 10/20/2015

## 2015-10-20 NOTE — Patient Instructions (Signed)
[facility] Discharge Instructions for Patients Receiving Chemotherapy  Today you received the following chemotherapy agents: Keytruda. To help prevent nausea and vomiting after your treatment, we encourage you to take your nausea medication. If you develop nausea and vomiting that is not controlled by your nausea medication, call the clinic.   BELOW ARE SYMPTOMS THAT SHOULD BE REPORTED IMMEDIATELY:  *FEVER GREATER THAN 100.5 F  *CHILLS WITH OR WITHOUT FEVER  NAUSEA AND VOMITING THAT IS NOT CONTROLLED WITH YOUR NAUSEA MEDICATION  *UNUSUAL SHORTNESS OF BREATH  *UNUSUAL BRUISING OR BLEEDING  TENDERNESS IN MOUTH AND THROAT WITH OR WITHOUT PRESENCE OF ULCERS  *URINARY PROBLEMS  *BOWEL PROBLEMS  UNUSUAL RASH Items with * indicate a potential emergency and should be followed up as soon as possible.  Feel free to call the clinic you have any questions or concerns. The clinic phone number is [phone].  Please show the [facility] at check-in to the Emergency Department and triage nurse.   Dehydration, Adult Dehydration is a condition in which you do not have enough fluid or water in your body. It happens when you take in less fluid than you lose. Vital organs such as the kidneys, brain, and heart cannot function without a proper amount of fluids. Any loss of fluids from the body can cause dehydration.  Dehydration can range from mild to severe. This condition should be treated right away to help prevent it from becoming severe. CAUSES  This condition may be caused by:  Vomiting.  Diarrhea.  Excessive sweating, such as when exercising in hot or humid weather.  Not drinking enough fluid during strenuous exercise or during an illness.  Excessive urine output.  Fever.  Certain medicines. RISK FACTORS This condition is more likely to develop in:  People who are taking certain medicines that cause the body to lose excess fluid (diuretics).   People  who have a chronic illness, such as diabetes, that may increase urination.  Older adults.   People who live at high altitudes.   People who participate in endurance sports.  SYMPTOMS  Mild Dehydration  Thirst.  Dry lips.  Slightly dry mouth.  Dry, warm skin. Moderate Dehydration  Very dry mouth.   Muscle cramps.   Dark urine and decreased urine production.   Decreased tear production.   Headache.   Light-headedness, especially when you stand up from a sitting position.  Severe Dehydration  Changes in skin.   Cold and clammy skin.   Skin does not spring back quickly when lightly pinched and released.   Changes in body fluids.   Extreme thirst.   No tears.   Not able to sweat when body temperature is high, such as in hot weather.   Minimal urine production.   Changes in vital signs.   Rapid, weak pulse (more than 100 beats per minute when you are sitting still).   Rapid breathing.   Low blood pressure.   Other changes.   Sunken eyes.   Cold hands and feet.   Confusion.  Lethargy and difficulty being awakened.  Fainting (syncope).   Short-term weight loss.   Unconsciousness. DIAGNOSIS  This condition may be diagnosed based on your symptoms. You may also have tests to determine how severe your dehydration is. These tests may include:   Urine tests.   Blood tests.  TREATMENT  Treatment for this condition depends on the severity. Mild or moderate dehydration can often be treated at home. Treatment should be started right  away. Do not wait until dehydration becomes severe. Severe dehydration needs to be treated at the hospital. Treatment for Mild Dehydration  Drinking plenty of water to replace the fluid you have lost.   Replacing minerals in your blood (electrolytes) that you may have lost.  Treatment for Moderate Dehydration  Consuming oral rehydration solution (ORS). Treatment for Severe  Dehydration  Receiving fluid through an IV tube.   Receiving electrolyte solution through a feeding tube that is passed through your nose and into your stomach (nasogastric tube or NG tube).  Correcting any abnormalities in electrolytes. HOME CARE INSTRUCTIONS   Drink enough fluid to keep your urine clear or pale yellow.   Drink water or fluid slowly by taking small sips. You can also try sucking on ice cubes.  Have food or beverages that contain electrolytes. Examples include bananas and sports drinks.  Take over-the-counter and prescription medicines only as told by your health care provider.   Prepare ORS according to the manufacturer's instructions. Take sips of ORS every 5 minutes until your urine returns to normal.  If you have vomiting or diarrhea, continue to try to drink water, ORS, or both.   If you have diarrhea, avoid:   Beverages that contain caffeine.   Fruit juice.   Milk.   Carbonated soft drinks.  Do not take salt tablets. This can lead to the condition of having too much sodium in your body (hypernatremia).  SEEK MEDICAL CARE IF:  You cannot eat or drink without vomiting.  You have had moderate diarrhea during a period of more than 24 hours.  You have a fever. SEEK IMMEDIATE MEDICAL CARE IF:   You have extreme thirst.  You have severe diarrhea.  You have not urinated in 6-8 hours, or you have urinated only a small amount of very dark urine.  You have shriveled skin.  You are dizzy, confused, or both.   This information is not intended to replace advice given to you by your health care provider. Make sure you discuss any questions you have with your health care provider.   Document Released: 04/11/2005 Document Revised: 12/31/2014 Document Reviewed: 08/27/2014 Elsevier Interactive Patient Education [employer].

## 2015-10-20 NOTE — Telephone Encounter (Signed)
Gave pt cal & avs °

## 2015-10-21 LAB — CEA: CEA1: 5 ng/mL — AB (ref 0.0–4.7)

## 2015-10-23 ENCOUNTER — Other Ambulatory Visit: Payer: Self-pay | Admitting: Hematology

## 2015-10-23 ENCOUNTER — Ambulatory Visit: Payer: Medicare Other

## 2015-10-23 NOTE — Progress Notes (Signed)
Vital signs stable; patient denies nausea, diarrhea or vomiting.  States that she has some weakness, but otherwise feels okay.  Patient home with daughter.

## 2015-11-08 ENCOUNTER — Encounter (HOSPITAL_COMMUNITY): Payer: Self-pay

## 2015-11-08 ENCOUNTER — Emergency Department (HOSPITAL_COMMUNITY)
Admission: EM | Admit: 2015-11-08 | Discharge: 2015-11-09 | Disposition: A | Discharge status: Home / Self Care | Payer: Medicare Other | Attending: Emergency Medicine | Admitting: Emergency Medicine

## 2015-11-08 DIAGNOSIS — R0602 Shortness of breath: Secondary | ICD-10-CM | POA: Diagnosis not present

## 2015-11-08 DIAGNOSIS — M069 Rheumatoid arthritis, unspecified: Secondary | ICD-10-CM | POA: Insufficient documentation

## 2015-11-08 DIAGNOSIS — K8050 Calculus of bile duct without cholangitis or cholecystitis without obstruction: Secondary | ICD-10-CM | POA: Insufficient documentation

## 2015-11-08 DIAGNOSIS — K838 Other specified diseases of biliary tract: Secondary | ICD-10-CM | POA: Diagnosis not present

## 2015-11-08 DIAGNOSIS — F17200 Nicotine dependence, unspecified, uncomplicated: Secondary | ICD-10-CM | POA: Diagnosis not present

## 2015-11-08 DIAGNOSIS — Z79899 Other long term (current) drug therapy: Secondary | ICD-10-CM | POA: Insufficient documentation

## 2015-11-08 DIAGNOSIS — Z85038 Personal history of other malignant neoplasm of large intestine: Secondary | ICD-10-CM | POA: Insufficient documentation

## 2015-11-08 DIAGNOSIS — R109 Unspecified abdominal pain: Secondary | ICD-10-CM

## 2015-11-08 DIAGNOSIS — K8020 Calculus of gallbladder without cholecystitis without obstruction: Secondary | ICD-10-CM | POA: Diagnosis not present

## 2015-11-08 DIAGNOSIS — C189 Malignant neoplasm of colon, unspecified: Secondary | ICD-10-CM | POA: Diagnosis not present

## 2015-11-08 DIAGNOSIS — R1013 Epigastric pain: Secondary | ICD-10-CM | POA: Diagnosis present

## 2015-11-08 DIAGNOSIS — R1011 Right upper quadrant pain: Secondary | ICD-10-CM | POA: Diagnosis not present

## 2015-11-08 NOTE — ED Provider Notes (Signed)
CSN: [id number]     Arrival date & time 11/08/15  2258 History  By signing my name below, I, Altamease Oiler, attest that this documentation has been prepared under the direction and in the presence of Merryl Hacker, MD. Electronically Signed: Altamease Oiler, ED Scribe. 11/09/2015. 1:30 AM   Chief Complaint  Patient presents with  . Abdominal Pain   The history is provided by the patient. No language interpreter was used.   Joann Herrera is a 82 y.o. female with PMHx of metastatic colon cancer on immunotherapy who presents to the Emergency Department complaining of constant, 10/10 in severity, mid abdominal pain with sudden onset 1.5 hours ago. She describes the pain as "like my stomach is going to explode". The pain does not radiate to her back. Tramadol provided no pain relief at home.   Associated symptoms include SOB, nausea, and vomiting. Pt denies fever and diarrhea. Denies constipation. Denies urinary symptoms. Denies fevers.  Past Medical History  Diagnosis Date  . Pneumonia 04/2012  . Rheumatoid Arthritis   . Macular degeneration   . Complication of anesthesia     "morphine caused severe shakes"  . PONV (postoperative nausea and vomiting)   . GERD (gastroesophageal reflux disease)   . Cancer (HCC)     squamous cell  . Colon carcinoma (HCC)     dx. left Colon cancer,evidence of metastasis   Past Surgical History  Procedure Laterality Date  . Total knee arthroplasty      x 2  . Hemorrhoid surgery    . Cataracts    . Hand surgery      Pt states she had both hands operated on for arthritis.  . Shoulder surgery Left     arthroscopy  . Total knee arthroplasty Right   . Total knee arthroplasty Left   . Hand surgery    . Cataract extraction, bilateral    . Tonsillectomy    . Shoulder arthroscopy Left   . Minor hemorrhoidectomy    . Esophagogastroduodenoscopy N/A 01/22/2015    Procedure: ESOPHAGOGASTRODUODENOSCOPY (EGD);  Surgeon: Milus Banister, MD;  Location: [facility];  Service: Endoscopy;  Laterality: N/A;  . Joint replacement    . Eye surgery      bilateral cataracts  . Colonoscopy with propofol N/A 04/30/2015    Procedure: COLONOSCOPY WITH PROPOFOL;  Surgeon: Milus Banister, MD;  Location: [facility];  Service: Endoscopy;  Laterality: N/A;   Family History  Problem Relation Age of Onset  . Arthritis Mother   . Arthritis-Osteo Mother   . Cancer Sister 64    breast  . Diabetes Sister   . Diabetes Brother   . Suicidality Brother   . Diabetes Brother   . Breast cancer Sister   . Cancer Maternal Aunt     breast cancer    Social History  Substance Use Topics  . Smoking status: Current Every Day Smoker -- 1.00 packs/day  . Smokeless tobacco: Never Used  . Alcohol Use: No   OB History    Gravida Para Term Preterm AB TAB SAB Ectopic Multiple Living   0 0 0 0 0 0 0 0       Review of Systems  Constitutional: Negative for fever.  Respiratory: Positive for shortness of breath.   Gastrointestinal: Positive for nausea, vomiting and abdominal pain. Negative for diarrhea.  Genitourinary: Negative for difficulty urinating.  Musculoskeletal: Positive for back pain.  All other systems reviewed and are negative.  Allergies  Aspirin; Codeine; Morphine and related; and Penicillins  Home Medications   Prior to Admission medications   Medication Sig Start Date End Date Taking? Authorizing Provider  acetaminophen (TYLENOL) 500 MG tablet Take 1,000 mg by mouth every 6 (six) hours as needed. For pain   Yes Historical Provider, MD  mirtazapine (REMERON) 15 MG tablet Take 1 tablet (15 mg total) by mouth at bedtime. 10/20/15  Yes Truitt Merle, MD  ondansetron (ZOFRAN-ODT) 8 MG disintegrating tablet Take 8 mg by mouth 3 (three) times daily as needed. For nausea. 08/14/15  Yes Historical Provider, MD  Polyethyl Glycol-Propyl Glycol (SYSTANE OP) Apply 1-2 drops to eye 4 (four) times daily as needed (for dry eye).   Yes Historical Provider, MD   traMADol (ULTRAM) 50 MG tablet Take 1 tablet (50 mg total) by mouth every 8 (eight) hours as needed. 09/25/15  Yes Truitt Merle, MD  ondansetron (ZOFRAN) 8 MG tablet Take 1 tablet (8 mg total) by mouth every 8 (eight) hours as needed for nausea. May fill with ODT if cost not prohibitive. Patient not taking: Reported on 11/08/2015 07/31/15   Truitt Merle, MD  prochlorperazine (COMPAZINE) 10 MG tablet Take 1 tablet (10 mg total) by mouth every 6 (six) hours as needed. Patient not taking: Reported on 11/08/2015 07/31/15   Truitt Merle, MD   BP 121/88 mmHg  Pulse 80  Resp 15  SpO2 95% Physical Exam  Constitutional: She is oriented to person, place, and time. No distress.  Uncomfortable appearing but nontoxic  HENT:  Head: Normocephalic and atraumatic.  Mouth/Throat: Oropharynx is clear and moist.  Cardiovascular: Normal rate, regular rhythm and normal heart sounds.   No murmur heard. Pulmonary/Chest: Effort normal and breath sounds normal. No respiratory distress. She has no wheezes.  Abdominal: Soft. Bowel sounds are normal. There is tenderness. There is no rebound and no guarding.  Diffuse TTP, wo rebound or guarding, most prominent RUQ  Neurological: She is alert and oriented to person, place, and time.  Skin: Skin is warm and dry.  Psychiatric: She has a normal mood and affect.  Nursing note and vitals reviewed.   ED Course  Procedures (including critical care time) DIAGNOSTIC STUDIES: Oxygen Saturation is 95% on RA,  normal by my interpretation.    COORDINATION OF CARE: 11:59 PM Discussed treatment plan which includes lab work and abdominal XR with pt at bedside and pt agreed to plan.  Labs Review Labs Reviewed  LIPASE, BLOOD - Abnormal; Notable for the following:    Lipase 65 (*)    All other components within normal limits  COMPREHENSIVE METABOLIC PANEL - Abnormal; Notable for the following:    Glucose, Bld 118 (*)    Creatinine, Ser 1.10 (*)    AST 162 (*)    Alkaline Phosphatase 225  (*)    GFR calc non Af Amer 43 (*)    GFR calc Af Amer 50 (*)    All other components within normal limits  CBC - Abnormal; Notable for the following:    WBC 10.8 (*)    All other components within normal limits  URINALYSIS, ROUTINE W REFLEX MICROSCOPIC (NOT AT [facility]) - Abnormal; Notable for the following:    Specific Gravity, Urine 1.044 (*)    All other components within normal limits  I-STAT CHEM 8, ED - Abnormal; Notable for the following:    Glucose, Bld 116 (*)    All other components within normal limits  I-STAT CG4 LACTIC ACID, ED  I-STAT CG4 LACTIC ACID, ED    Imaging Review Ct Abdomen Pelvis W Contrast  11/09/2015  CLINICAL DATA:  Mid abdominal pain.  Metastatic colon cancer EXAM: CT ABDOMEN AND PELVIS WITH CONTRAST TECHNIQUE: Multidetector CT imaging of the abdomen and pelvis was performed using the standard protocol following bolus administration of intravenous contrast. CONTRAST:  35mL ISOVUE-300 IOPAMIDOL (ISOVUE-300) INJECTION 61% COMPARISON:  PET-CT 10/16/2015.  Abdominal CT 04/16/2015 FINDINGS: Lower chest and abdominal wall:  Dependent atelectasis. Hepatobiliary: No focal hepatic lesion is seen.New intra and extrahepatic bile duct enlargement without convincing choledocholithiasis. The common bile duct at the pancreatic head measures 12 mm. Known cholelithiasis. Porta hepatis adenopathy without malignant compression of the biliary tree. Pancreas: Unremarkable. Spleen: Unremarkable. Adrenals/Urinary Tract: Negative adrenals. Mild bilateral renal atrophy. The right lower pole renal cyst. Unremarkable bladder. Stomach/Bowel: Low sigmoid mass correlating with patient's history of colon cancer. Diffuse formed stool. No high-grade obstruction. No bowel inflammation. Reproductive:No pathologic findings. Vascular/Lymphatic: Extensive atherosclerotic calcification of the aorta and branch vessels. Mild to moderate narrowing of the bilateral common iliac arteries. Known malignant adenopathy  in the porta hepatis with central cystic change. The portacaval node that measures 18 mm in short axis. Nodal size is similar to PET-CT. Other: No ascites or pneumoperitoneum. Musculoskeletal: No acute abnormalities. Advanced lumbar disc and facet degeneration with levoscoliosis. IMPRESSION: 1. New bile duct enlargement. Cholelithiasis without visible choledocholithiasis. 2. Colon cancer and malignant porta hepatis adenopathy. Stable findings compared to 10/16/2015 PET-CT. 3. Probable constipation. Electronically Signed   By: Monte Fantasia M.D.   On: 11/09/2015 03:21   Dg Abd Acute W/chest  11/09/2015  CLINICAL DATA:  Severe abdominal pain EXAM: DG ABDOMEN ACUTE W/ 1V CHEST COMPARISON:  PET-CT 10/16/2015 FINDINGS: Normal heart size and mediastinal contours. Emphysematous changes. No acute infiltrate or edema. No effusion or pneumothorax. No acute osseous findings. Normal bowel gas pattern. No concerning intra-abdominal mass effect or calcification. Negative for pneumoperitoneum. Osteopenia.  Degenerative lumbar levoscoliosis. IMPRESSION: 1. No acute finding at the chest or abdomen. 2.  Emphysema. (ICD10-J43.9) 3.  Aortic Atherosclerosis (ICD10-170.0) Electronically Signed   By: Monte Fantasia M.D.   On: 11/09/2015 02:03   US Abdomen Limited Ruq  11/09/2015  CLINICAL DATA:  Abdominal pain since last night. Metastatic colon cancer. EXAM: US ABDOMEN LIMITED - RIGHT UPPER QUADRANT COMPARISON:  CT from earlier today. FINDINGS: Gallbladder: Filled with gallstones. Overestimated wall thickness at 3 mm. No pericholecystic inflammation or focal tenderness. Common bile duct: Diameter: 13 mm. Where visualized, no filling defect. The CBD is not visualized at the level of the pancreatic head. Liver: No focal lesion identified. Within normal limits in parenchymal echogenicity. IMPRESSION: 1. Numerous gallstones.  No evidence of cholecystitis. 2. 13 mm CBD.  No visible choledocholithiasis. Electronically Signed   By:  Monte Fantasia M.D.   On: 11/09/2015 05:00   I have personally reviewed and evaluated these images and lab results as part of my medical decision-making.   EKG Interpretation   Date/Time:  Monday November 09 2015 00:07:23 EDT Ventricular Rate:  81 PR Interval:    QRS Duration: 119 QT Interval:  414 QTC Calculation: 481 R Axis:   62 Text Interpretation:  Sinus rhythm Incomplete right bundle branch block  Low voltage, extremity and precordial leads Confirmed by Quinnlan Abruzzo  MD,  Jermal Dismuke ([id number]) on 11/09/2015 12:46:45 AM      MDM   Final diagnoses:  Abdominal pain  Biliary colic  Common bile duct dilation    Patient presents with abdominal  pain. Mostly epigastric and right upper quadrant. She is tender without signs of peritonitis on exam. Vital signs reassuring. Patient was given pain and nausea medication. Lab work notable for mildly elevated LFTs with an AST of 164 and an alkaline phosphatase of 225. Patient has an extensive history of stage IV colon cancer. CT abdomen obtained. Cancer appears stable; however, there is mild dilation of the common bile duct without stone present. On repeat abdominal exam, patient reports complete resolution of symptoms. Abdominal exam is benign at this time. However, given CT findings, would elect to obtain her records will order ultrasound to evaluate for choledocholithiasis. Patient is in agreement.  Right upper quadrant ultrasound shows numerous gallstones but no evidence of cholecystitis. Common bile duct is 13 mm with no evidence of choledocholithiasis. Patient continues to be asymptomatic at this time. Patient is able to tolerate fluids. Given that she is comfortable and there is no evidence of acute obstruction, will discharge home. Repeat LFTs at PCP and follow-up with surgery. She is to return if she has any new or worsening pain, fever or any new or worsening symptoms.  After history, exam, and medical workup I feel the patient has been appropriately  medically screened and is safe for discharge home. Pertinent diagnoses were discussed with the patient. Patient was given return precautions.  I personally performed the services described in this documentation, which was scribed in my presence. The recorded information has been reviewed and is accurate.   Merryl Hacker, MD 11/09/15 [phone]

## 2015-11-08 NOTE — ED Notes (Signed)
Patient states has mid upper abdominal pain that began about an hour ago.  Patient states that vomited x1 today, denies blood in emesis. Patient states that takes pain medication but, ate before she took it.  Patient unsure if it is related.  Patient rates pain 10/10.

## 2015-11-09 ENCOUNTER — Emergency Department (HOSPITAL_COMMUNITY): Payer: Medicare Other

## 2015-11-09 ENCOUNTER — Encounter (HOSPITAL_COMMUNITY): Payer: Self-pay

## 2015-11-09 DIAGNOSIS — C189 Malignant neoplasm of colon, unspecified: Secondary | ICD-10-CM | POA: Diagnosis not present

## 2015-11-09 DIAGNOSIS — K8050 Calculus of bile duct without cholangitis or cholecystitis without obstruction: Secondary | ICD-10-CM | POA: Diagnosis not present

## 2015-11-09 DIAGNOSIS — R109 Unspecified abdominal pain: Secondary | ICD-10-CM | POA: Diagnosis not present

## 2015-11-09 LAB — URINALYSIS, ROUTINE W REFLEX MICROSCOPIC
Bilirubin Urine: NEGATIVE
GLUCOSE, UA: NEGATIVE mg/dL
HGB URINE DIPSTICK: NEGATIVE
Ketones, ur: NEGATIVE mg/dL
Leukocytes, UA: NEGATIVE
Nitrite: NEGATIVE
PH: 7.5 (ref 5.0–8.0)
PROTEIN: NEGATIVE mg/dL
SPECIFIC GRAVITY, URINE: 1.044 — AB (ref 1.005–1.030)

## 2015-11-09 LAB — COMPREHENSIVE METABOLIC PANEL
ALT: 53 U/L (ref 14–54)
AST: 162 U/L — AB (ref 15–41)
Albumin: 3.8 g/dL (ref 3.5–5.0)
Alkaline Phosphatase: 225 U/L — ABNORMAL HIGH (ref 38–126)
Anion gap: 7 (ref 5–15)
BUN: 20 mg/dL (ref 6–20)
CHLORIDE: 109 mmol/L (ref 101–111)
CO2: 24 mmol/L (ref 22–32)
CREATININE: 1.1 mg/dL — AB (ref 0.44–1.00)
Calcium: 8.9 mg/dL (ref 8.9–10.3)
GFR calc non Af Amer: 43 mL/min — ABNORMAL LOW (ref >60)
GFR, EST AFRICAN AMERICAN: 50 mL/min — AB (ref >60)
Glucose, Bld: 118 mg/dL — ABNORMAL HIGH (ref 65–99)
Potassium: 4.3 mmol/L (ref 3.5–5.1)
SODIUM: 140 mmol/L (ref 135–145)
Total Bilirubin: 0.8 mg/dL (ref 0.3–1.2)
Total Protein: 7.4 g/dL (ref 6.5–8.1)

## 2015-11-09 LAB — LIPASE, BLOOD: LIPASE: 65 U/L — AB (ref 11–51)

## 2015-11-09 LAB — I-STAT CHEM 8, ED
BUN: 18 mg/dL (ref 6–20)
CALCIUM ION: 1.12 mmol/L (ref 1.12–1.23)
CHLORIDE: 109 mmol/L (ref 101–111)
CREATININE: 1 mg/dL (ref 0.44–1.00)
GLUCOSE: 116 mg/dL — AB (ref 65–99)
HCT: 36 % (ref 36.0–46.0)
Hemoglobin: 12.2 g/dL (ref 12.0–15.0)
Potassium: 4.4 mmol/L (ref 3.5–5.1)
Sodium: 142 mmol/L (ref 135–145)
TCO2: 25 mmol/L (ref 0–100)

## 2015-11-09 LAB — CBC
HCT: 37.3 % (ref 36.0–46.0)
Hemoglobin: 12.1 g/dL (ref 12.0–15.0)
MCH: 27.9 pg (ref 26.0–34.0)
MCHC: 32.4 g/dL (ref 30.0–36.0)
MCV: 85.9 fL (ref 78.0–100.0)
PLATELETS: 162 10*3/uL (ref 150–400)
RBC: 4.34 MIL/uL (ref 3.87–5.11)
RDW: 15.1 % (ref 11.5–15.5)
WBC: 10.8 10*3/uL — ABNORMAL HIGH (ref 4.0–10.5)

## 2015-11-09 LAB — I-STAT CG4 LACTIC ACID, ED: Lactic Acid, Venous: 0.91 mmol/L (ref 0.5–1.9)

## 2015-11-09 MED ORDER — SODIUM CHLORIDE 0.9 % IV BOLUS (SEPSIS)
1000.0000 mL | Freq: Once | INTRAVENOUS | Status: AC
  Administered 2015-11-09: 1000 mL via INTRAVENOUS

## 2015-11-09 MED ORDER — IOPAMIDOL (ISOVUE-300) INJECTION 61%
100.0000 mL | Freq: Once | INTRAVENOUS | Status: AC | PRN
  Administered 2015-11-09: 80 mL via INTRAVENOUS

## 2015-11-09 MED ORDER — ONDANSETRON HCL 4 MG/2ML IJ SOLN
4.0000 mg | Freq: Once | INTRAMUSCULAR | Status: AC
  Administered 2015-11-09: 4 mg via INTRAVENOUS
  Filled 2015-11-09: qty 2

## 2015-11-09 MED ORDER — HYDROMORPHONE HCL 1 MG/ML IJ SOLN
1.0000 mg | Freq: Once | INTRAMUSCULAR | Status: AC
  Administered 2015-11-09: 1 mg via INTRAVENOUS
  Filled 2015-11-09: qty 1

## 2015-11-09 NOTE — ED Notes (Signed)
Patient transported to CT 

## 2015-11-09 NOTE — ED Notes (Signed)
Attempted to obtain urine from patient.  Pt unable to urinate.

## 2015-11-09 NOTE — ED Notes (Signed)
Pt wheeled to son's car.  Pt ambulatory and independent at discharge. Verbalized understanding of discharge instructions.

## 2015-11-09 NOTE — Discharge Instructions (Signed)
You were seen today for abdominal pain. Your pain is likely related to gallstones. You have some dilation of her common bile duct; however, your pain has improved. There is no evidence of gallstone in your gallbladder.  He will be discharged home with surgery follow-up. If your pain returns or worsens he need to be reevaluated immediately.  Cholelithiasis Cholelithiasis (also called gallstones) is a form of gallbladder disease in which gallstones form in your gallbladder. The gallbladder is an organ that stores bile made in the liver, which helps digest fats. Gallstones begin as small crystals and slowly grow into stones. Gallstone pain occurs when the gallbladder spasms and a gallstone is blocking the duct. Pain can also occur when a stone passes out of the duct.  RISK FACTORS  Being female.   Having multiple pregnancies. Health care providers sometimes advise removing diseased gallbladders before future pregnancies.   Being obese.  Eating a diet heavy in fried foods and fat.   Being older than 77 years and increasing age.   Prolonged use of medicines containing female hormones.   Having diabetes mellitus.   Rapidly losing weight.   Having a family history of gallstones (heredity).  SYMPTOMS  Nausea.   Vomiting.  Abdominal pain.   Yellowing of the skin (jaundice).   Sudden pain. It may persist from several minutes to several hours.  Fever.   Tenderness to the touch. In some cases, when gallstones do not move into the bile duct, people have no pain or symptoms. These are called "silent" gallstones.  TREATMENT Silent gallstones do not need treatment. In severe cases, emergency surgery may be required. Options for treatment include:  Surgery to remove the gallbladder. This is the most common treatment.  Medicines. These do not always work and may take 6-12 months or more to work.  Shock wave treatment (extracorporeal biliary lithotripsy). In this treatment an  ultrasound machine sends shock waves to the gallbladder to break gallstones into smaller pieces that can pass into the intestines or be dissolved by medicine. HOME CARE INSTRUCTIONS   Only take over-the-counter or prescription medicines for pain, discomfort, or fever as directed by your health care provider.   Follow a low-fat diet until seen again by your health care provider. Fat causes the gallbladder to contract, which can result in pain.   Follow up with your health care provider as directed. Attacks are almost always recurrent and surgery is usually required for permanent treatment.  SEEK IMMEDIATE MEDICAL CARE IF:   Your pain increases and is not controlled by medicines.   You have a fever or persistent symptoms for more than 2-3 days.   You have a fever and your symptoms suddenly get worse.   You have persistent nausea and vomiting.  MAKE SURE YOU:   Understand these instructions.  Will watch your condition.  Will get help right away if you are not doing well or get worse.   This information is not intended to replace advice given to you by your health care provider. Make sure you discuss any questions you have with your health care provider.   Document Released: 04/07/2005 Document Revised: 12/12/2012 Document Reviewed: 10/03/2012 Elsevier Interactive Patient Education [employer].

## 2015-11-09 NOTE — ED Notes (Signed)
Patient transported to X-ray 

## 2015-11-10 ENCOUNTER — Ambulatory Visit (HOSPITAL_BASED_OUTPATIENT_CLINIC_OR_DEPARTMENT_OTHER): Payer: Medicare Other

## 2015-11-10 ENCOUNTER — Encounter: Payer: Self-pay | Admitting: Hematology

## 2015-11-10 ENCOUNTER — Ambulatory Visit (HOSPITAL_BASED_OUTPATIENT_CLINIC_OR_DEPARTMENT_OTHER): Payer: Medicare Other | Admitting: Hematology

## 2015-11-10 ENCOUNTER — Telehealth: Payer: Self-pay | Admitting: Hematology

## 2015-11-10 ENCOUNTER — Other Ambulatory Visit (HOSPITAL_BASED_OUTPATIENT_CLINIC_OR_DEPARTMENT_OTHER): Payer: Medicare Other

## 2015-11-10 VITALS — BP 113/51 | HR 60 | Temp 97.9°F | Resp 17 | Ht 63.0 in | Wt 134.2 lb

## 2015-11-10 DIAGNOSIS — R112 Nausea with vomiting, unspecified: Secondary | ICD-10-CM

## 2015-11-10 DIAGNOSIS — Z79899 Other long term (current) drug therapy: Secondary | ICD-10-CM

## 2015-11-10 DIAGNOSIS — R740 Nonspecific elevation of levels of transaminase and lactic acid dehydrogenase [LDH]: Secondary | ICD-10-CM

## 2015-11-10 DIAGNOSIS — G4700 Insomnia, unspecified: Secondary | ICD-10-CM

## 2015-11-10 DIAGNOSIS — C187 Malignant neoplasm of sigmoid colon: Secondary | ICD-10-CM | POA: Diagnosis not present

## 2015-11-10 DIAGNOSIS — R530 Neoplastic (malignant) related fatigue: Secondary | ICD-10-CM

## 2015-11-10 DIAGNOSIS — C186 Malignant neoplasm of descending colon: Secondary | ICD-10-CM

## 2015-11-10 DIAGNOSIS — R109 Unspecified abdominal pain: Secondary | ICD-10-CM

## 2015-11-10 DIAGNOSIS — Z5112 Encounter for antineoplastic immunotherapy: Secondary | ICD-10-CM | POA: Diagnosis not present

## 2015-11-10 DIAGNOSIS — E46 Unspecified protein-calorie malnutrition: Secondary | ICD-10-CM

## 2015-11-10 LAB — CBC WITH DIFFERENTIAL/PLATELET
BASO%: 0.9 % (ref 0.0–2.0)
Basophils Absolute: 0.1 10*3/uL (ref 0.0–0.1)
EOS%: 6.9 % (ref 0.0–7.0)
Eosinophils Absolute: 0.4 10*3/uL (ref 0.0–0.5)
HCT: 37.4 % (ref 34.8–46.6)
HGB: 12.2 g/dL (ref 11.6–15.9)
LYMPH%: 32 % (ref 14.0–49.7)
MCH: 27.9 pg (ref 25.1–34.0)
MCHC: 32.6 g/dL (ref 31.5–36.0)
MCV: 85.4 fL (ref 79.5–101.0)
MONO#: 0.5 10*3/uL (ref 0.1–0.9)
MONO%: 9.6 % (ref 0.0–14.0)
NEUT%: 50.6 % (ref 38.4–76.8)
NEUTROS ABS: 2.7 10*3/uL (ref 1.5–6.5)
Platelets: 266 10*3/uL (ref 145–400)
RBC: 4.38 10*6/uL (ref 3.70–5.45)
RDW: 15.3 % — ABNORMAL HIGH (ref 11.2–14.5)
WBC: 5.4 10*3/uL (ref 3.9–10.3)
lymph#: 1.7 10*3/uL (ref 0.9–3.3)

## 2015-11-10 LAB — COMPREHENSIVE METABOLIC PANEL
ALBUMIN: 3.2 g/dL — AB (ref 3.5–5.0)
ALK PHOS: 282 U/L — AB (ref 40–150)
ALT: 197 U/L — AB (ref 0–55)
ANION GAP: 10 meq/L (ref 3–11)
AST: 128 U/L — AB (ref 5–34)
BILIRUBIN TOTAL: 0.37 mg/dL (ref 0.20–1.20)
BUN: 15.8 mg/dL (ref 7.0–26.0)
CALCIUM: 9 mg/dL (ref 8.4–10.4)
CHLORIDE: 109 meq/L (ref 98–109)
CO2: 22 mEq/L (ref 22–29)
CREATININE: 1 mg/dL (ref 0.6–1.1)
EGFR: 52 mL/min/{1.73_m2} — ABNORMAL LOW (ref >90)
Glucose: 86 mg/dl (ref 70–140)
Potassium: 3.9 mEq/L (ref 3.5–5.1)
Sodium: 141 mEq/L (ref 136–145)
TOTAL PROTEIN: 7.1 g/dL (ref 6.4–8.3)

## 2015-11-10 LAB — TSH: TSH: 1.525 m[IU]/L (ref 0.308–3.960)

## 2015-11-10 MED ORDER — METHYLPREDNISOLONE SODIUM SUCC 40 MG IJ SOLR
40.0000 mg | Freq: Once | INTRAMUSCULAR | Status: AC
  Administered 2015-11-10: 40 mg via INTRAVENOUS

## 2015-11-10 MED ORDER — SODIUM CHLORIDE 0.9 % IV SOLN
2.0000 mg/kg | Freq: Once | INTRAVENOUS | Status: AC
  Administered 2015-11-10: 125 mg via INTRAVENOUS
  Filled 2015-11-10: qty 5

## 2015-11-10 MED ORDER — MIRTAZAPINE 30 MG PO TBDP: 30.0000 mg | ORAL_TABLET | Freq: Every day | ORAL | Status: DC

## 2015-11-10 MED ORDER — METHYLPREDNISOLONE SODIUM SUCC 40 MG IJ SOLR
INTRAMUSCULAR | Status: AC
  Filled 2015-11-10: qty 1

## 2015-11-10 MED ORDER — SODIUM CHLORIDE 0.9 % IV SOLN
INTRAVENOUS | Status: AC
  Administered 2015-11-10: 12:00:00 via INTRAVENOUS

## 2015-11-10 MED ORDER — SODIUM CHLORIDE 0.9 % IV SOLN
Freq: Once | INTRAVENOUS | Status: AC
  Administered 2015-11-10: 12:00:00 via INTRAVENOUS

## 2015-11-10 MED FILL — ONDANSETRON ODT 8 MG TABLET: 8 | 10 days supply | Qty: 30 | Fill #2

## 2015-11-10 MED FILL — MIRTAZAPINE 30 MG ODT: 30 | 30 days supply | Qty: 30 | Fill #0

## 2015-11-10 NOTE — Progress Notes (Signed)
Pt saw [name] prior to chemo today.  OK to treat as per md.

## 2015-11-10 NOTE — Patient Instructions (Signed)
Pembrolizumab injection What is this medicine? PEMBROLIZUMAB (pem broe liz ue mab) is a monoclonal antibody. It is used to treat melanoma and non-small cell lung cancer. This medicine may be used for other purposes; ask your health care provider or pharmacist if you have questions. What should I tell my health care provider before I take this medicine? They need to know if you have any of these conditions: -diabetes -immune system problems -inflammatory bowel disease -liver disease -lung or breathing disease -lupus -an unusual or allergic reaction to pembrolizumab, other medicines, foods, dyes, or preservatives -pregnant or trying to get pregnant -breast-feeding How should I use this medicine? This medicine is for infusion into a vein. It is given by a health care professional in a hospital or clinic setting. A special MedGuide will be given to you before each treatment. Be sure to read this information carefully each time. Talk to your pediatrician regarding the use of this medicine in children. Special care may be needed. Overdosage: If you think you have taken too much of this medicine contact a poison control center or emergency room at once. NOTE: This medicine is only for you. Do not share this medicine with others. What if I miss a dose? It is important not to miss your dose. Call your doctor or health care professional if you are unable to keep an appointment. What may interact with this medicine? Interactions have not been studied. Give your health care provider a list of all the medicines, herbs, non-prescription drugs, or dietary supplements you use. Also tell them if you smoke, drink alcohol, or use illegal drugs. Some items may interact with your medicine. This list may not describe all possible interactions. Give your health care provider a list of all the medicines, herbs, non-prescription drugs, or dietary supplements you use. Also tell them if you smoke, drink alcohol, or  use illegal drugs. Some items may interact with your medicine. What should I watch for while using this medicine? Your condition will be monitored carefully while you are receiving this medicine. You may need blood work done while you are taking this medicine. Do not become pregnant while taking this medicine or for 4 months after stopping it. Women should inform their doctor if they wish to become pregnant or think they might be pregnant. There is a potential for serious side effects to an unborn child. Talk to your health care professional or pharmacist for more information. Do not breast-feed an infant while taking this medicine or for 4 months after the last dose. What side effects may I notice from receiving this medicine? Side effects that you should report to your doctor or health care professional as soon as possible: -allergic reactions like skin rash, itching or hives, swelling of the face, lips, or tongue -bloody or black, tarry stools -breathing problems -change in the amount of urine -changes in vision -chest pain -chills -dark urine -dizziness or feeling faint or lightheaded -fast or irregular heartbeat -fever -flushing -hair loss -muscle pain -muscle weakness -persistent headache -signs and symptoms of high blood sugar such as dizziness; dry mouth; dry skin; fruity breath; nausea; stomach pain; increased hunger or thirst; increased urination -signs and symptoms of liver injury like dark urine, light-colored stools, loss of appetite, nausea, right upper belly pain, yellowing of the eyes or skin -stomach pain -weight loss Side effects that usually do not require medical attention (Report these to your doctor or health care professional if they continue or are bothersome.):constipation -cough -diarrhea -joint pain -  tiredness This list may not describe all possible side effects. Call your doctor for medical advice about side effects. You may report side effects to FDA at  1-800-FDA-1088. Where should I keep my medicine? This drug is given in a hospital or clinic and will not be stored at home. NOTE: This sheet is a summary. It may not cover all possible information. If you have questions about this medicine, talk to your doctor, pharmacist, or health care provider.    2016, Elsevier/Gold Standard. (2014-06-10 17:24:19)  

## 2015-11-10 NOTE — Progress Notes (Signed)
[facility]  [phone] [phone]  Clinic Follow up Note   Patient Care Team: [name], MD as PCP - General (Internal Medicine) Truitt Merle, MD as Consulting Physician (Hematology) Erroll Luna, MD as Consulting Physician (General Surgery) Milus Banister, MD as Attending Physician (Gastroenterology) 11/10/2015   CHIEF COMPLAINTS:  Follow up metastatic colon cancer   HISTORY OF PRESENTING ILLNESS(05/18/2015):  Joann Herrera 82 y.o. female is here because of recently diagnosed colon cancer. She is accompanied by her daughter and son to the clinic today.  She has had upper abdominal pain and intermittent nausea and vomiting for almost 2 years, she had multiple ED visit and hospitalization. She describes her pain is located in the epigastric area, usually happens after meals, 5/10 , radiates to back, with associated nausea and vomiting. She has moderate fatigue, she is no energy to do much activity except self cares. She has intermittent dizziness, especially when she stands up. Her appetite is low, she eats small meals. she had intermittent diarrhea for a few years with lossoe BM, but recently developed mild to moderate constipation, she lasot about 15 lbs in the past few years   She was referred to gastroenterologist Dr. Ardis Hughs, and underwent EGD on 01/22/2015, which was unremarkable. Abdominal ultrasound on 03/17/2015 showed gallbladder stone, and a 3.3 cm hypoechoic mass in the portal hepatitis. She subsequently underwent abdominal MRI on 04/30/2015, which showed a 2 cm short axis node in the porta hepatis and additional 1.4 cm port cable node, with central necrosis. She underwent a colonoscopy on 04/30/2015, which showed multiple polyps and a malignant appearing mass in the sigmoid colon, the biopsy confirmed adenocarcinoma. She was seen by surgeon Dr. Brantley Stage.  CURRENT THERAPY: Keytruda '200mg'$  iv every 3 weeks started on 07/08/2015, dose reduced to  '2mg'$ /kg from cycle 2 on 08/31/15 due to severe reaction after first infusion  INTERIM HISTORY: [name] returns for follow-up. She is accompanied by her son Elta Guadeloupe today. She developed severe epigastric pain two days ago, and was seen in the emergency room. CT scan and ultrasound of abdomen was done, which showed slightly dilated CBD, and numerous gallbladder stones. Her pain resolved after intravenous pain medication. She was discharged home. She denies any fever or chills, no recurrent pain since then. She feels about the same as before, still feel quite fatigued, with low appetite, weight is stable, he also has constipation, only has bowel movement every 2-4 days. No other new complaints.  MEDICAL HISTORY:  Past Medical History  Diagnosis Date  . Pneumonia 04/2012  . Rheumatoid Arthritis   . Macular degeneration   . Complication of anesthesia     "morphine caused severe shakes"  . PONV (postoperative nausea and vomiting)   . GERD (gastroesophageal reflux disease)   . Cancer (HCC)     squamous cell  . Colon carcinoma ([facility])     dx. left Colon cancer,evidence of metastasis    SURGICAL HISTORY: Past Surgical History  Procedure Laterality Date  . Total knee arthroplasty      x 2  . Hemorrhoid surgery    . Cataracts    . Hand surgery      Pt states she had both hands operated on for arthritis.  . Shoulder surgery Left     arthroscopy  . Total knee arthroplasty Right   . Total knee arthroplasty Left   . Hand surgery    . Cataract extraction, bilateral    . Tonsillectomy    .  Shoulder arthroscopy Left   . Minor hemorrhoidectomy    . Esophagogastroduodenoscopy N/A 01/22/2015    Procedure: ESOPHAGOGASTRODUODENOSCOPY (EGD);  Surgeon: Milus Banister, MD;  Location: [facility];  Service: Endoscopy;  Laterality: N/A;  . Joint replacement    . Eye surgery      bilateral cataracts  . Colonoscopy with propofol N/A 04/30/2015    Procedure: COLONOSCOPY WITH PROPOFOL;  Surgeon: Milus Banister,  MD;  Location: [facility];  Service: Endoscopy;  Laterality: N/A;    SOCIAL HISTORY: Social History   Social History  . Marital Status: Widowed    Spouse Name: N/A  . Number of Children: 3  . Years of Education: N/A   Occupational History  . Retired    Social History Main Topics  . Smoking status: Current Every Day Smoker -- 1.00 packs/day  . Smokeless tobacco: Never Used  . Alcohol Use: No  . Drug Use: No  . Sexual Activity: Not on file   Other Topics Concern  . Not on file   Social History Narrative   ** Merged History Encounter **       Daughter in [place]) [phone]   Son in [place]   Son in [place]   Retired- Medical sales representative business, worked at [employer] for 28 years   Enjoys dancing and going to [place]   Completed 11th grade   Very poor vision due to macular degeneration          FAMILY HISTORY: Family History  Problem Relation Age of Onset  . Arthritis Mother   . Arthritis-Osteo Mother   . Cancer Sister 52    breast  . Diabetes Sister   . Diabetes Brother   . Suicidality Brother   . Diabetes Brother   . Breast cancer Sister   . Cancer Maternal Aunt     breast cancer     ALLERGIES:  is allergic to aspirin; codeine; morphine and related; and penicillins.  MEDICATIONS:  Current Outpatient Prescriptions  Medication Sig Dispense Refill  . acetaminophen (TYLENOL) 500 MG tablet Take 1,000 mg by mouth every 6 (six) hours as needed. For pain    . ondansetron (ZOFRAN-ODT) 8 MG disintegrating tablet Take 8 mg by mouth 3 (three) times daily as needed. For nausea.  3  . Polyethyl Glycol-Propyl Glycol (SYSTANE OP) Apply 1-2 drops to eye 4 (four) times daily as needed (for dry eye).    . traMADol (ULTRAM) 50 MG tablet Take 1 tablet (50 mg total) by mouth every 8 (eight) hours as needed. 90 tablet 0  . mirtazapine (REMERON SOL-TAB) 30 MG disintegrating tablet Take 1 tablet (30 mg total) by mouth at bedtime. 30  tablet 1  . ondansetron (ZOFRAN) 8 MG tablet Take 1 tablet (8 mg total) by mouth every 8 (eight) hours as needed for nausea. May fill with ODT if cost not prohibitive. (Patient not taking: Reported on 11/08/2015) 30 tablet 3  . prochlorperazine (COMPAZINE) 10 MG tablet Take 1 tablet (10 mg total) by mouth every 6 (six) hours as needed. (Patient not taking: Reported on 11/08/2015) 30 tablet 3   No current facility-administered medications for this visit.    REVIEW OF SYSTEMS:   Constitutional: Denies fevers, chills or abnormal night sweats Eyes: Denies blurriness of vision, double vision or watery eyes Ears, nose, mouth, throat, and face: Denies mucositis or sore throat Respiratory: Denies cough, dyspnea or wheezes Cardiovascular: Denies palpitation, chest discomfort or lower extremity swelling  Gastrointestinal:  Denies nausea, heartburn or change in bowel habits Skin: Denies abnormal skin rashes Lymphatics: Denies new lymphadenopathy or easy bruising Neurological:Denies numbness, tingling or new weaknesses Behavioral/Psych: Mood is stable, no new changes  All other systems were reviewed with the patient and are negative.  PHYSICAL EXAMINATION: ECOG PERFORMANCE STATUS: 3  Filed Vitals:   11/10/15 1040  BP: 113/51  Pulse: 60  Temp: 97.9 F (36.6 C)  Resp: 17   Filed Weights   11/10/15 1040  Weight: 134 lb 3.2 oz (60.873 kg)    GENERAL:alert, no distress and comfortable SKIN: skin color, texture, turgor are normal, no rashes or significant lesions EYES: normal, conjunctiva are pink and non-injected, sclera clear OROPHARYNX:no exudate, no erythema and lips, buccal mucosa, and tongue normal  NECK: supple, thyroid normal size, non-tender, without nodularity LYMPH:  no palpable lymphadenopathy in the cervical, axillary or inguinal LUNGS: clear to auscultation and percussion with normal breathing effort HEART: regular rate & rhythm and no murmurs and no lower extremity  edema ABDOMEN:abdomen soft, mild tenderness at RUQ and epigastric area, normal bowel sounds Musculoskeletal:no cyanosis of digits and no clubbing  PSYCH: alert & oriented x 3 with fluent speech NEURO: no focal motor/sensory deficits  LABORATORY DATA:  I have reviewed the data as listed CBC Latest Ref Rng 11/10/2015 11/09/2015 11/08/2015  WBC 3.9 - 10.3 10e3/uL 5.4 - 10.8(H)  Hemoglobin 11.6 - 15.9 g/dL 12.2 12.2 12.1  Hematocrit 34.8 - 46.6 % 37.4 36.0 37.3  Platelets 145 - 400 10e3/uL 266 - 162    CMP Latest Ref Rng 11/10/2015 11/09/2015 11/08/2015  Glucose 70 - 140 mg/dl 86 116(H) 118(H)  BUN 7.0 - 26.0 mg/dL 15.'8 18 20  '$ Creatinine 0.6 - 1.1 mg/dL 1.0 1.00 1.10(H)  Sodium 136 - 145 mEq/L 141 142 140  Potassium 3.5 - 5.1 mEq/L 3.9 4.4 4.3  Chloride 101 - 111 mmol/L - 109 109  CO2 22 - 29 mEq/L 22 - 24  Calcium 8.4 - 10.4 mg/dL 9.0 - 8.9  Total Protein 6.4 - 8.3 g/dL 7.1 - 7.4  Total Bilirubin 0.20 - 1.20 mg/dL 0.37 - 0.8  Alkaline Phos 40 - 150 U/L 282(H) - 225(H)  AST 5 - 34 U/L 128(H) - 162(H)  ALT 0 - 55 U/L 197(H) - 53    PATHOLOGY REPORT  Diagnosis 04/29/2014 1. Colon, biopsy, ascending - TUBULAR ADENOMA. NO HIGH GRADE DYSPLASIA OR MALIGNANCY IDENTIFIED. 2. Colon, biopsy, 35 cm sigmoid mass r/o malignancy - POORLY DIFFERENTIATED INVASIVE ADENOCARCINOMA. 3. Colon, polyp(s), sigmoid - TUBULAR ADENOMA. NO HIGH GRADE DYSPLASIA OR MALIGNANCY IDENTIFIED.   RADIOGRAPHIC STUDIES: I have personally reviewed the radiological images as listed and agreed with the findings in the report. Ct Abdomen Pelvis W Contrast  11/09/2015  CLINICAL DATA:  Mid abdominal pain.  Metastatic colon cancer EXAM: CT ABDOMEN AND PELVIS WITH CONTRAST TECHNIQUE: Multidetector CT imaging of the abdomen and pelvis was performed using the standard protocol following bolus administration of intravenous contrast. CONTRAST:  82m ISOVUE-300 IOPAMIDOL (ISOVUE-300) INJECTION 61% COMPARISON:  PET-CT 10/16/2015.   Abdominal CT 04/16/2015 FINDINGS: Lower chest and abdominal wall:  Dependent atelectasis. Hepatobiliary: No focal hepatic lesion is seen.New intra and extrahepatic bile duct enlargement without convincing choledocholithiasis. The common bile duct at the pancreatic head measures 12 mm. Known cholelithiasis. Porta hepatis adenopathy without malignant compression of the biliary tree. Pancreas: Unremarkable. Spleen: Unremarkable. Adrenals/Urinary Tract: Negative adrenals. Mild bilateral renal atrophy. The right lower pole renal cyst. Unremarkable bladder. Stomach/Bowel: Low sigmoid mass  correlating with patient's history of colon cancer. Diffuse formed stool. No high-grade obstruction. No bowel inflammation. Reproductive:No pathologic findings. Vascular/Lymphatic: Extensive atherosclerotic calcification of the aorta and branch vessels. Mild to moderate narrowing of the bilateral common iliac arteries. Known malignant adenopathy in the porta hepatis with central cystic change. The portacaval node that measures 18 mm in short axis. Nodal size is similar to PET-CT. Other: No ascites or pneumoperitoneum. Musculoskeletal: No acute abnormalities. Advanced lumbar disc and facet degeneration with levoscoliosis. IMPRESSION: 1. New bile duct enlargement. Cholelithiasis without visible choledocholithiasis. 2. Colon cancer and malignant porta hepatis adenopathy. Stable findings compared to 10/16/2015 PET-CT. 3. Probable constipation. Electronically Signed   By: Marnee Spring M.D.   On: 11/09/2015 03:21   Nm Pet Image Restag (ps) Skull Base To Thigh  10/16/2015  CLINICAL DATA:  Subsequent treatment strategy for colon cancer. EXAM: NUCLEAR MEDICINE PET SKULL BASE TO THIGH TECHNIQUE: 6.4 mCi F-18 FDG was injected intravenously. Full-ring PET imaging was performed from the skull base to thigh after the radiotracer. CT data was obtained and used for attenuation correction and anatomic localization. FASTING BLOOD GLUCOSE:  Value:  104 mg/dl COMPARISON:  15/15/7914 FINDINGS: NECK No hypermetabolic lymph nodes in the neck. CHEST There is a 9 mm right paratracheal lymph node which has an SUV max equal to 3.77. On the previous exam this node measured 5 mm and had an SUV max equal to 1.7. Aortic atherosclerosis noted. Calcification within the RCA and LAD coronary artery noted. Moderate changes of centrilobular and paraseptal emphysema. No hypermetabolic pulmonary nodules or masses identified. No suspicious pulmonary nodules on the CT scan. ABDOMEN/PELVIS No abnormal hypermetabolic activity within the liver, pancreas, adrenal glands, or spleen. Hypermetabolic upper abdominal lymph nodes are again identified. Index peripancreatic lymph node measures 1.3 cm and has an SUV max equal to 13.2. Previously this measured 2.4 cm and had an SUV max equal to 13.26. Portacaval lymph node Measures 1.9 cm and has an SUV max equal to 12.59. Previously this measured 1.9 cm and had an SUV max equal to 16.27. No new or progressive hypermetabolic adenopathy within the abdomen or pelvis. Nonspecific focus of increased uptake within the sigmoid colon is identified within SUV max equal to 25.08. SKELETON No evidence for hypermetabolic bone metastases. IMPRESSION: 1. Persistent enlarged and intensely hypermetabolic upper abdominal lymph nodes are identified compatible with metastatic adenopathy. When compared with the previous exam the appearance is not significantly changed. 2. Mild increase in size and FDG uptake associated with the right paratracheal lymph node. This is a nonspecific finding but warrants attention on follow-up imaging. 3. Nonspecific focus of intense increased uptake noted within the sigmoid colon. Although likely physiologic correlation with colon cancer screening techniques advise. 4. Aortic atherosclerosis and multi vessel coronary artery calcifications. Electronically Signed   By: Signa Kell M.D.   On: 10/16/2015 12:55   Dg Abd Acute  W/chest  11/09/2015  CLINICAL DATA:  Severe abdominal pain EXAM: DG ABDOMEN ACUTE W/ 1V CHEST COMPARISON:  PET-CT 10/16/2015 FINDINGS: Normal heart size and mediastinal contours. Emphysematous changes. No acute infiltrate or edema. No effusion or pneumothorax. No acute osseous findings. Normal bowel gas pattern. No concerning intra-abdominal mass effect or calcification. Negative for pneumoperitoneum. Osteopenia.  Degenerative lumbar levoscoliosis. IMPRESSION: 1. No acute finding at the chest or abdomen. 2.  Emphysema. (ICD10-J43.9) 3.  Aortic Atherosclerosis (ICD10-170.0) Electronically Signed   By: Marnee Spring M.D.   On: 11/09/2015 02:03   US Abdomen Limited Ruq  11/09/2015  CLINICAL  DATA:  Abdominal pain since last night. Metastatic colon cancer. EXAM: US ABDOMEN LIMITED - RIGHT UPPER QUADRANT COMPARISON:  CT from earlier today. FINDINGS: Gallbladder: Filled with gallstones. Overestimated wall thickness at 3 mm. No pericholecystic inflammation or focal tenderness. Common bile duct: Diameter: 13 mm. Where visualized, no filling defect. The CBD is not visualized at the level of the pancreatic head. Liver: No focal lesion identified. Within normal limits in parenchymal echogenicity. IMPRESSION: 1. Numerous gallstones.  No evidence of cholecystitis. 2. 13 mm CBD.  No visible choledocholithiasis. Electronically Signed   By: Monte Fantasia M.D.   On: 11/09/2015 05:00    ASSESSMENT & PLAN:  82 year old female, with past medical history of rheumatoid arthritis, macular degeneration, and squamous cell skin cancer, presented with epigastric pain, nausea and weight loss.  1. Sigmoid colon cancer, [id number], clinical stage IV with periportal node metastases, poorly differentiated adenocarcinoma, MSI-high  -I previously reviewed her colonoscopy findings and pathology results from the biopsies. -She has no significant regional lymphadenopathy, but has two large necrotic appearing periportal nodes, which are very  suspicious for metastasis. -I reviewed her PET scan findings with patient and her family members, which showed hypermetabolic periportal lymph nodes and sigmoid colon mass, no other metastasis. No other primary tumor was seen on the PET scan, I think the PET portal lymphadenopathy are likely distant metastasis from her colon cancer. -She previously declined hospice -She is on first line Keytruda, dosed reduced from cycle 2 due to severe reaction to first cycle. She tolerated the low dose better. -I discussed her restaging CT scan findings from 10/16/2015, which showed a stable disease overall. No new lesions. -She is clinically stable, wishes to continue current treatment. lab reviewed, adequate for treatment, will proceed cycle 5 today  2. Abdominal pain -secondary to her metastatic cancer, and cholithiasis  -continue tramadol as needed.  - she refuses narcotics, she had bad experience with morphine and codein in the past.  3. Nausea and vomiting -continue Zofran as need, she did not tolerate the Compazine well, had dizziness  4. Fatigue and malnutrition -I encouraged her to continue ensure 2-3 bottles a day -follow up with dietitian  -her weight is stable    5. Insomnia -I encouraged her to try mirtazapine, which is also for her low appetite and depression. Potential benefit and side effects discussed with patient, she agrees to try -She has increased mirtazapine to '30mg'$  daily at bed time  6. Transaminitis -She has elevated liver enzymes today, possibly related to her recent episode of gallbladder stone -Continue monitoring  Plan: -Lab reviewed, mild transaminitis, adequate for treatment, we'll proceed cycle 5 Kaytruda, with same low dose at '2mg'$ /kg  -I'll see her back in 3 weeks  All questions were answered. The patient knows to call the clinic with any problems, questions or concerns.  I spent 25 minutes counseling the patient face to face. The total time spent in the appointment  was 30 minutes and more than 50% was on counseling.     Truitt Merle, MD 11/10/2015

## 2015-11-10 NOTE — Telephone Encounter (Signed)
Gave pt cal & avs °

## 2015-12-04 ENCOUNTER — Ambulatory Visit (HOSPITAL_BASED_OUTPATIENT_CLINIC_OR_DEPARTMENT_OTHER): Payer: Medicare Other

## 2015-12-04 ENCOUNTER — Other Ambulatory Visit (HOSPITAL_BASED_OUTPATIENT_CLINIC_OR_DEPARTMENT_OTHER): Payer: Medicare Other

## 2015-12-04 ENCOUNTER — Telehealth: Payer: Self-pay | Admitting: Hematology

## 2015-12-04 ENCOUNTER — Encounter: Payer: Self-pay | Admitting: Hematology

## 2015-12-04 ENCOUNTER — Ambulatory Visit (HOSPITAL_BASED_OUTPATIENT_CLINIC_OR_DEPARTMENT_OTHER): Payer: Medicare Other | Admitting: Hematology

## 2015-12-04 VITALS — BP 109/48 | HR 61 | Temp 98.0°F | Resp 17 | Ht 63.0 in | Wt 131.5 lb

## 2015-12-04 DIAGNOSIS — C187 Malignant neoplasm of sigmoid colon: Secondary | ICD-10-CM

## 2015-12-04 DIAGNOSIS — R740 Nonspecific elevation of levels of transaminase and lactic acid dehydrogenase [LDH]: Secondary | ICD-10-CM

## 2015-12-04 DIAGNOSIS — G4700 Insomnia, unspecified: Secondary | ICD-10-CM

## 2015-12-04 DIAGNOSIS — E46 Unspecified protein-calorie malnutrition: Secondary | ICD-10-CM

## 2015-12-04 DIAGNOSIS — C772 Secondary and unspecified malignant neoplasm of intra-abdominal lymph nodes: Secondary | ICD-10-CM

## 2015-12-04 DIAGNOSIS — R530 Neoplastic (malignant) related fatigue: Secondary | ICD-10-CM

## 2015-12-04 DIAGNOSIS — C186 Malignant neoplasm of descending colon: Secondary | ICD-10-CM

## 2015-12-04 DIAGNOSIS — Z5112 Encounter for antineoplastic immunotherapy: Secondary | ICD-10-CM | POA: Diagnosis not present

## 2015-12-04 DIAGNOSIS — R1013 Epigastric pain: Secondary | ICD-10-CM

## 2015-12-04 DIAGNOSIS — R112 Nausea with vomiting, unspecified: Secondary | ICD-10-CM

## 2015-12-04 LAB — COMPREHENSIVE METABOLIC PANEL
ALT: <9 U/L (ref 0–55)
AST: 11 U/L (ref 5–34)
Albumin: 3.2 g/dL — ABNORMAL LOW (ref 3.5–5.0)
Alkaline Phosphatase: 103 U/L (ref 40–150)
Anion Gap: 10 mEq/L (ref 3–11)
BUN: 20 mg/dL (ref 7.0–26.0)
CHLORIDE: 110 meq/L — AB (ref 98–109)
CO2: 21 meq/L — AB (ref 22–29)
Calcium: 9.3 mg/dL (ref 8.4–10.4)
Creatinine: 0.9 mg/dL (ref 0.6–1.1)
EGFR: 55 mL/min/{1.73_m2} — AB (ref >90)
GLUCOSE: 95 mg/dL (ref 70–140)
POTASSIUM: 4 meq/L (ref 3.5–5.1)
SODIUM: 140 meq/L (ref 136–145)
Total Bilirubin: <0.3 mg/dL (ref 0.20–1.20)
Total Protein: 7.1 g/dL (ref 6.4–8.3)

## 2015-12-04 LAB — CBC WITH DIFFERENTIAL/PLATELET
BASO%: 0.8 % (ref 0.0–2.0)
Basophils Absolute: 0 10*3/uL (ref 0.0–0.1)
EOS ABS: 0.3 10*3/uL (ref 0.0–0.5)
EOS%: 5.6 % (ref 0.0–7.0)
HCT: 35.6 % (ref 34.8–46.6)
HGB: 11.6 g/dL (ref 11.6–15.9)
LYMPH%: 34.9 % (ref 14.0–49.7)
MCH: 27.4 pg (ref 25.1–34.0)
MCHC: 32.6 g/dL (ref 31.5–36.0)
MCV: 84 fL (ref 79.5–101.0)
MONO#: 0.5 10*3/uL (ref 0.1–0.9)
MONO%: 9.8 % (ref 0.0–14.0)
NEUT%: 48.9 % (ref 38.4–76.8)
NEUTROS ABS: 2.5 10*3/uL (ref 1.5–6.5)
Platelets: 273 10*3/uL (ref 145–400)
RBC: 4.24 10*6/uL (ref 3.70–5.45)
RDW: 15.1 % — ABNORMAL HIGH (ref 11.2–14.5)
WBC: 5.2 10*3/uL (ref 3.9–10.3)
lymph#: 1.8 10*3/uL (ref 0.9–3.3)

## 2015-12-04 LAB — TSH: TSH: 2.091 m[IU]/L (ref 0.308–3.960)

## 2015-12-04 LAB — CEA (IN HOUSE-CHCC): CEA (CHCC-In House): 4.82 ng/mL (ref 0.00–5.00)

## 2015-12-04 MED ORDER — METHYLPREDNISOLONE SODIUM SUCC 40 MG IJ SOLR
40.0000 mg | Freq: Once | INTRAMUSCULAR | Status: AC
  Administered 2015-12-04: 40 mg via INTRAVENOUS

## 2015-12-04 MED ORDER — SODIUM CHLORIDE 0.9 % IV SOLN: Freq: Once | INTRAVENOUS | Status: DC

## 2015-12-04 MED ORDER — SODIUM CHLORIDE 0.9 % IV SOLN
2.0000 mg/kg | Freq: Once | INTRAVENOUS | Status: AC
  Administered 2015-12-04: 125 mg via INTRAVENOUS
  Filled 2015-12-04: qty 5

## 2015-12-04 MED ORDER — SODIUM CHLORIDE 0.9 % IV SOLN
INTRAVENOUS | Status: AC
  Administered 2015-12-04: 11:00:00 via INTRAVENOUS

## 2015-12-04 MED ORDER — METHYLPREDNISOLONE SODIUM SUCC 40 MG IJ SOLR
INTRAMUSCULAR | Status: AC
  Filled 2015-12-04: qty 1

## 2015-12-04 NOTE — Progress Notes (Signed)
[place]  [phone] [phone]  Clinic Follow up Note   Patient Care Team: Joann Rail, MD as PCP - General (Internal Medicine) Joann Merle, MD as Consulting Physician (Hematology) Joann Luna, MD as Consulting Physician (General Surgery) Joann Banister, MD as Attending Physician (Gastroenterology) 12/04/2015   CHIEF COMPLAINTS:  Follow up metastatic colon cancer   HISTORY OF PRESENTING ILLNESS(05/18/2015):  Joann Herrera 82 y.o. female is here because of recently diagnosed colon cancer. She is accompanied by her Joann and son to the clinic today.  She has had upper abdominal pain and intermittent nausea and vomiting for almost 2 years, she had multiple ED visit and hospitalization. She describes her pain is located in the epigastric area, usually happens after meals, 5/10 , radiates to back, with associated nausea and vomiting. She has moderate fatigue, she is no energy to do much activity except self cares. She has intermittent dizziness, especially when she stands up. Her appetite is low, she eats small meals. she had intermittent diarrhea for a few years with lossoe BM, but recently developed mild to moderate constipation, she lasot about 15 lbs in the past few years   She was referred to gastroenterologist Dr. Ardis Herrera, and underwent EGD on 01/22/2015, which was unremarkable. Abdominal ultrasound on 03/17/2015 showed gallbladder stone, and a 3.3 cm hypoechoic mass in the portal hepatitis. She subsequently underwent abdominal MRI on 04/30/2015, which showed a 2 cm short axis node in the porta hepatis and additional 1.4 cm port cable node, with central necrosis. She underwent a colonoscopy on 04/30/2015, which showed multiple polyps and a malignant appearing mass in the sigmoid colon, the biopsy confirmed adenocarcinoma. She was seen by surgeon Dr. Brantley Herrera.  CURRENT THERAPY: Keytruda 272m iv every 3 weeks started on 07/08/2015, dose reduced to  21mkg from cycle 2 on 08/31/15 due to severe reaction after first infusion  INTERIM HISTORY: Joann Herrera for follow-up. She is accompanied by her son Joann [place]. She is clinically stable, she still has mild to moderate epigastric discomfort, which is stable overall. She takes tramadol as needed. She also noticed mild right upper quadrant abdominal discomfort lately. She has no appetite and eats small meals. She is quite fatigued, still able to take care of herself at home, but she does not do much other activities. No fever or chills. Her weight is stable.  MEDICAL HISTORY:  Past Medical History:  Diagnosis Date  . Cancer (HCC)    squamous cell  . [place]   [place]. left Colon cancer,evidence of metastasis  . Complication of anesthesia    "morphine caused severe shakes"  . GERD (gastroesophageal reflux disease)   . Macular degeneration   . Pneumonia 04/2012  . PONV (postoperative nausea and vomiting)   . Rheumatoid Arthritis     SURGICAL HISTORY: Past Surgical History:  Procedure Laterality Date  . CATARACT EXTRACTION, BILATERAL    . Cataracts    . COLONOSCOPY WITH PROPOFOL N/A 04/30/2015   Procedure: COLONOSCOPY WITH PROPOFOL;  Surgeon: Joann BanisterMD;  Location: [facility];  Service: Endoscopy;  Laterality: N/A;  . ESOPHAGOGASTRODUODENOSCOPY N/A 01/22/2015   Procedure: ESOPHAGOGASTRODUODENOSCOPY (EGD);  Surgeon: Joann BanisterMD;  [facility];  Service: Endoscopy;  Laterality: N/A;  . EYE SURGERY     bilateral cataracts  . HAND SURGERY     Pt states she had both hands operated on for arthritis.  . Marland KitchenAND SURGERY    . HEMORRHOID SURGERY    .  JOINT REPLACEMENT    . MINOR HEMORRHOIDECTOMY    . SHOULDER ARTHROSCOPY Left   . SHOULDER SURGERY Left    arthroscopy  . TONSILLECTOMY    . TOTAL KNEE ARTHROPLASTY     x 2  . TOTAL KNEE ARTHROPLASTY Right   . TOTAL KNEE ARTHROPLASTY Left     SOCIAL HISTORY: Social History   Social History  . Marital  status: Widowed    Spouse name: N/A  . Number of children: 3  . Years of education: N/A   Occupational History  . Retired    Social History Main Topics  . Smoking status: Current Every Day Smoker    Packs/day: 1.00  . Smokeless tobacco: Never Used  . Alcohol use No  . Drug use: No  . Sexual activity: Not on file   Other Topics Concern  . Not on file   Social History Narrative   ** Merged History Encounter **       Joann Herrera) [phone]   Son in [place]   Son in [place]   Retired- Medical sales representative business, worked at [employer] for 28 years   Enjoys dancing and going to [place]   Completed 11th grade   Very poor vision due to macular degeneration          FAMILY HISTORY: Family History  Problem Relation Age of Onset  . Arthritis Mother   . Arthritis-Osteo Mother   . Cancer Sister 22    breast  . Diabetes Sister   . Diabetes Brother   . Suicidality Brother   . Diabetes Brother   . Breast cancer Sister   . Cancer Maternal Aunt     breast cancer     ALLERGIES:  is allergic to aspirin; codeine; morphine and related; and penicillins.  MEDICATIONS:  Current Outpatient Prescriptions  Medication Sig Dispense Refill  . acetaminophen (TYLENOL) 500 MG tablet Take 1,000 mg by mouth every 6 (six) hours as needed. For pain    . mirtazapine (REMERON SOL-TAB) 30 MG disintegrating tablet Take 1 tablet (30 mg total) by mouth at bedtime. 30 tablet 1  . ondansetron (ZOFRAN) 8 MG tablet Take 1 tablet (8 mg total) by mouth every 8 (eight) hours as needed for nausea. May fill with ODT if cost not prohibitive. 30 tablet 3  . ondansetron (ZOFRAN-ODT) 8 MG disintegrating tablet Take 8 mg by mouth 3 (three) times daily as needed. For nausea.  3  . Polyethyl Glycol-Propyl Glycol (SYSTANE OP) Apply 1-2 drops to eye 4 (four) times daily as needed (for dry eye).    . prochlorperazine (COMPAZINE) 10 MG tablet Take 1 tablet (10 mg  total) by mouth every 6 (six) hours as needed. 30 tablet 3  . traMADol (ULTRAM) 50 MG tablet Take 1 tablet (50 mg total) by mouth every 8 (eight) hours as needed. 90 tablet 0   No current facility-administered medications for this visit.     REVIEW OF SYSTEMS:   Constitutional: Denies fevers, chills or abnormal night sweats Eyes: Denies blurriness of vision, double vision or watery eyes Ears, nose, mouth, throat, and face: Denies mucositis or sore throat Respiratory: Denies cough, dyspnea or wheezes Cardiovascular: Denies palpitation, chest discomfort or lower extremity swelling Gastrointestinal:  Denies nausea, heartburn or change in bowel habits Skin: Denies abnormal skin rashes Lymphatics: Denies new lymphadenopathy or easy bruising Neurological:Denies numbness, tingling or new weaknesses Behavioral/Psych: Mood is stable, no new changes  All other  systems were reviewed with the patient and are negative.  PHYSICAL EXAMINATION: ECOG PERFORMANCE STATUS: 3  Vitals:   12/04/15 0929  BP: (!) 109/48  Pulse: 61  Resp: 17  Temp: 98 F (36.7 C)   Filed Weights   12/04/15 0929  Weight: 131 lb 8 oz (59.6 kg)    GENERAL:alert, no distress and comfortable SKIN: skin color, texture, turgor are normal, no rashes or significant lesions EYES: normal, conjunctiva are pink and non-injected, sclera clear OROPHARYNX:no exudate, no erythema and lips, buccal mucosa, and tongue normal  NECK: supple, thyroid normal size, non-tender, without nodularity LYMPH:  no palpable lymphadenopathy in the cervical, axillary or inguinal LUNGS: clear to auscultation and percussion with normal breathing effort HEART: regular rate & rhythm and no murmurs and no lower extremity edema ABDOMEN:abdomen soft, mild tenderness at RUQ and epigastric area, normal bowel sounds Musculoskeletal:no cyanosis of digits and no clubbing  PSYCH: alert & oriented x 3 with fluent speech NEURO: no focal motor/sensory  deficits  LABORATORY DATA:  I have reviewed the data as listed CBC Latest Ref Rng & Units 12/04/2015 11/10/2015 11/09/2015  WBC 3.9 - 10.3 10e3/uL 5.2 5.4 -  Hemoglobin 11.6 - 15.9 g/dL 11.6 12.2 12.2  Hematocrit 34.8 - 46.6 % 35.6 37.4 36.0  Platelets 145 - 400 10e3/uL 273 266 -    CMP Latest Ref Rng & Units 12/04/2015 11/10/2015 11/09/2015  Glucose 70 - 140 mg/dl 95 86 116(H)  BUN 7.0 - 26.0 mg/dL 20.0 15.8 18  Creatinine 0.6 - 1.1 mg/dL 0.9 1.0 1.00  Sodium 136 - 145 mEq/L 140 141 142  Potassium 3.5 - 5.1 mEq/L 4.0 3.9 4.4  Chloride 101 - 111 mmol/L - - 109  CO2 22 - 29 mEq/L 21(L) 22 -  Calcium 8.4 - 10.4 mg/dL 9.3 9.0 -  Total Protein 6.4 - 8.3 g/dL 7.1 7.1 -  Total Bilirubin 0.20 - 1.20 mg/dL <0.30 0.37 -  Alkaline Phos 40 - 150 U/L 103 282(H) -  AST 5 - 34 U/L 11 128(H) -  ALT 0 - 55 U/L <9 197(H) -    PATHOLOGY REPORT  Diagnosis 04/29/2014 1. Colon, biopsy, ascending - TUBULAR ADENOMA. NO HIGH GRADE DYSPLASIA OR MALIGNANCY IDENTIFIED. 2. Colon, biopsy, 35 cm sigmoid mass r/o malignancy - POORLY DIFFERENTIATED INVASIVE ADENOCARCINOMA. 3. Colon, polyp(s), sigmoid - TUBULAR ADENOMA. NO HIGH GRADE DYSPLASIA OR MALIGNANCY IDENTIFIED.   RADIOGRAPHIC STUDIES: I have personally reviewed the radiological images as listed and agreed with the findings in the report. Ct Abdomen Pelvis W Contrast  Result Date: 11/09/2015 CLINICAL DATA:  Mid abdominal pain.  Metastatic colon cancer EXAM: CT ABDOMEN AND PELVIS WITH CONTRAST TECHNIQUE: Multidetector CT imaging of the abdomen and pelvis was performed using the standard protocol following bolus administration of intravenous contrast. CONTRAST:  38m ISOVUE-300 IOPAMIDOL (ISOVUE-300) INJECTION 61% COMPARISON:  PET-CT 10/16/2015.  Abdominal CT 04/16/2015 FINDINGS: Lower chest and abdominal wall:  Dependent atelectasis. Hepatobiliary: No focal hepatic lesion is seen.New intra and extrahepatic bile duct enlargement without convincing  choledocholithiasis. The common bile duct at the pancreatic head measures 12 mm. Known cholelithiasis. Porta hepatis adenopathy without malignant compression of the biliary tree. Pancreas: Unremarkable. Spleen: Unremarkable. Adrenals/Urinary Tract: Negative adrenals. Mild bilateral renal atrophy. The right lower pole renal cyst. Unremarkable bladder. Stomach/Bowel: Low sigmoid mass correlating with patient's history of colon cancer. Diffuse formed stool. No high-grade obstruction. No bowel inflammation. Reproductive:No pathologic findings. Vascular/Lymphatic: Extensive atherosclerotic calcification of the aorta and branch vessels. Mild to moderate narrowing  of the bilateral common iliac arteries. Known malignant adenopathy in the porta hepatis with central cystic change. The portacaval node that measures 18 mm in short axis. Nodal size is similar to PET-CT. Other: No ascites or pneumoperitoneum. Musculoskeletal: No acute abnormalities. Advanced lumbar disc and facet degeneration with levoscoliosis. IMPRESSION: 1. New bile duct enlargement. Cholelithiasis without visible choledocholithiasis. 2. Colon cancer and malignant porta hepatis adenopathy. Stable findings compared to 10/16/2015 PET-CT. 3. Probable constipation. Electronically Signed   By: Monte Fantasia M.D.   On: 11/09/2015 03:21   Dg Abd Acute W/chest  Result Date: 11/09/2015 CLINICAL DATA:  Severe abdominal pain EXAM: DG ABDOMEN ACUTE W/ 1V CHEST COMPARISON:  PET-CT 10/16/2015 FINDINGS: Normal heart size and mediastinal contours. Emphysematous changes. No acute infiltrate or edema. No effusion or pneumothorax. No acute osseous findings. Normal bowel gas pattern. No concerning intra-abdominal mass effect or calcification. Negative for pneumoperitoneum. Osteopenia.  Degenerative lumbar levoscoliosis. IMPRESSION: 1. No acute finding at the chest or abdomen. 2.  Emphysema. (ICD10-J43.9) 3.  Aortic Atherosclerosis (ICD10-170.0) Electronically Signed   By:  Monte Fantasia M.D.   On: 11/09/2015 02:03   US Abdomen Limited Ruq  Result Date: 11/09/2015 CLINICAL DATA:  Abdominal pain since last night. Metastatic colon cancer. EXAM: US ABDOMEN LIMITED - RIGHT UPPER QUADRANT COMPARISON:  CT from earlier today. FINDINGS: Gallbladder: Filled with gallstones. Overestimated wall thickness at 3 mm. No pericholecystic inflammation or focal tenderness. Common bile duct: Diameter: 13 mm. Where visualized, no filling defect. The CBD is not visualized at the level of the pancreatic head. Liver: No focal lesion identified. Within normal limits in parenchymal echogenicity. IMPRESSION: 1. Numerous gallstones.  No evidence of cholecystitis. 2. 13 mm CBD.  No visible choledocholithiasis. Electronically Signed   By: Monte Fantasia M.D.   On: 11/09/2015 05:00    ASSESSMENT & PLAN:  82 year old female, with past medical history of rheumatoid arthritis, macular degeneration, and squamous cell skin cancer, presented with epigastric pain, nausea and weight loss.  1. Sigmoid colon cancer, [id number], clinical Herrera IV with periportal node metastases, poorly differentiated adenocarcinoma, MSI-high  -I previously reviewed her colonoscopy findings and pathology results from the biopsies. -She has no significant regional lymphadenopathy, but has two large necrotic appearing periportal nodes, which are very suspicious for metastasis. -I reviewed her PET scan findings with patient and her family members, which showed hypermetabolic periportal lymph nodes and sigmoid colon mass, no other metastasis. No other primary tumor was seen on the PET scan, I think the PET portal lymphadenopathy are likely distant metastasis from her colon cancer. -She previously declined hospice -She is on first line Keytruda, dosed reduced from cycle 2 due to severe reaction to first cycle. She tolerated the low dose better. -I discussed her restaging CT scan findings from 10/16/2015, which showed a stable disease  overall. No new lesions. -She is clinically stable, wishes to continue current treatment. lab reviewed, her transaminitis has resolved, will proceed treatment today  2. Abdominal pain -secondary to her metastatic cancer, and cholithiasis  -continue tramadol as needed.  - she refuses narcotics, she had bad experience with morphine and codein in the past.  3. Nausea and vomiting -continue Zofran as need, she did not tolerate the Compazine well, had dizziness  4. Fatigue and malnutrition -I encouraged her to continue ensure 2-3 bottles a day -follow up with dietitian  -her weight is stable    5. Insomnia -Improved with mirtazapine, I encouraged her to increase mirtazapine to 58m daily at bed time  6. Cholelithiasis and transaminitis -Her transaminitis is likely secondary to her cholelithiasis -I recommend her to avoid fatty meals -Due to her underlying metastatic colon cancer, and advanced age, she is not a good candidate for cholecystectomy  Plan: -Lab reviewed, adequate for treatment, we'll proceed Kaytruda, with same low dose at 57m/kg  -I'll see her back in 3 weeks  All questions were answered. The patient knows to call the clinic with any problems, questions or concerns.  I spent 25 minutes counseling the patient face to face. The total time spent in the appointment was 30 minutes and more than 50% was on counseling.     FTruitt Merle MD 12/04/2015

## 2015-12-04 NOTE — Patient Instructions (Addendum)
[facility] Discharge Instructions for Patients Receiving Chemotherapy  Today you received the following chemotherapy agents: Keytruda.  To help prevent nausea and vomiting after your treatment, we encourage you to take your nausea medication: Compazine 10 mg every 6 hours as needed.   If you develop nausea and vomiting that is not controlled by your nausea medication, call the clinic.   BELOW ARE SYMPTOMS THAT SHOULD BE REPORTED IMMEDIATELY:  *FEVER GREATER THAN 100.5 F  *CHILLS WITH OR WITHOUT FEVER  NAUSEA AND VOMITING THAT IS NOT CONTROLLED WITH YOUR NAUSEA MEDICATION  *UNUSUAL SHORTNESS OF BREATH  *UNUSUAL BRUISING OR BLEEDING  TENDERNESS IN MOUTH AND THROAT WITH OR WITHOUT PRESENCE OF ULCERS  *URINARY PROBLEMS  *BOWEL PROBLEMS  UNUSUAL RASH Items with * indicate a potential emergency and should be followed up as soon as possible.  Feel free to call the clinic you have any questions or concerns. The clinic phone number is [phone].  Please show the [facility] at check-in to the Emergency Department and triage nurse.   Dehydration, Adult Dehydration is a condition in which you do not have enough fluid or water in your body. It happens when you take in less fluid than you lose. Vital organs such as the kidneys, brain, and heart cannot function without a proper amount of fluids. Any loss of fluids from the body can cause dehydration.  Dehydration can range from mild to severe. This condition should be treated right away to help prevent it from becoming severe. CAUSES  This condition may be caused by:  Vomiting.  Diarrhea.  Excessive sweating, such as when exercising in hot or humid weather.  Not drinking enough fluid during strenuous exercise or during an illness.  Excessive urine output.  Fever.  Certain medicines. RISK FACTORS This condition is more likely to develop in:  People who are taking certain medicines that cause the body  to lose excess fluid (diuretics).   People who have a chronic illness, such as diabetes, that may increase urination.  Older adults.   People who live at high altitudes.   People who participate in endurance sports.  SYMPTOMS  Mild Dehydration  Thirst.  Dry lips.  Slightly dry mouth.  Dry, warm skin. Moderate Dehydration  Very dry mouth.   Muscle cramps.   Dark urine and decreased urine production.   Decreased tear production.   Headache.   Light-headedness, especially when you stand up from a sitting position.  Severe Dehydration  Changes in skin.   Cold and clammy skin.   Skin does not spring back quickly when lightly pinched and released.   Changes in body fluids.   Extreme thirst.   No tears.   Not able to sweat when body temperature is high, such as in hot weather.   Minimal urine production.   Changes in vital signs.   Rapid, weak pulse (more than 100 beats per minute when you are sitting still).   Rapid breathing.   Low blood pressure.   Other changes.   Sunken eyes.   Cold hands and feet.   Confusion.  Lethargy and difficulty being awakened.  Fainting (syncope).   Short-term weight loss.   Unconsciousness. DIAGNOSIS  This condition may be diagnosed based on your symptoms. You may also have tests to determine how severe your dehydration is. These tests may include:   Urine tests.   Blood tests.  TREATMENT  Treatment for this condition depends on the severity. Mild or moderate dehydration  can often be treated at home. Treatment should be started right away. Do not wait until dehydration becomes severe. Severe dehydration needs to be treated at the hospital. Treatment for Mild Dehydration  Drinking plenty of water to replace the fluid you have lost.   Replacing minerals in your blood (electrolytes) that you may have lost.  Treatment for Moderate Dehydration  Consuming oral rehydration solution  (ORS). Treatment for Severe Dehydration  Receiving fluid through an IV tube.   Receiving electrolyte solution through a feeding tube that is passed through your nose and into your stomach (nasogastric tube or NG tube).  Correcting any abnormalities in electrolytes. HOME CARE INSTRUCTIONS   Drink enough fluid to keep your urine clear or pale yellow.   Drink water or fluid slowly by taking small sips. You can also try sucking on ice cubes.  Have food or beverages that contain electrolytes. Examples include bananas and sports drinks.  Take over-the-counter and prescription medicines only as told by your health care provider.   Prepare ORS according to the manufacturer's instructions. Take sips of ORS every 5 minutes until your urine returns to normal.  If you have vomiting or diarrhea, continue to try to drink water, ORS, or both.   If you have diarrhea, avoid:   Beverages that contain caffeine.   Fruit juice.   Milk.   Carbonated soft drinks.  Do not take salt tablets. This can lead to the condition of having too much sodium in your body (hypernatremia).  SEEK MEDICAL CARE IF:  You cannot eat or drink without vomiting.  You have had moderate diarrhea during a period of more than 24 hours.  You have a fever. SEEK IMMEDIATE MEDICAL CARE IF:   You have extreme thirst.  You have severe diarrhea.  You have not urinated in 6-8 hours, or you have urinated only a small amount of very dark urine.  You have shriveled skin.  You are dizzy, confused, or both.   This information is not intended to replace advice given to you by your health care provider. Make sure you discuss any questions you have with your health care provider.   Document Released: 04/11/2005 Document Revised: 12/31/2014 Document Reviewed: 08/27/2014 Elsevier Interactive Patient Education [employer].

## 2015-12-04 NOTE — Telephone Encounter (Signed)
Gave pt cal & avs °

## 2015-12-05 LAB — CEA: CEA: 5.3 ng/mL — ABNORMAL HIGH (ref 0.0–4.7)

## 2015-12-25 ENCOUNTER — Telehealth: Payer: Self-pay | Admitting: Hematology

## 2015-12-25 ENCOUNTER — Ambulatory Visit (HOSPITAL_BASED_OUTPATIENT_CLINIC_OR_DEPARTMENT_OTHER): Payer: Medicare Other

## 2015-12-25 ENCOUNTER — Encounter: Payer: Self-pay | Admitting: Hematology

## 2015-12-25 ENCOUNTER — Other Ambulatory Visit (HOSPITAL_BASED_OUTPATIENT_CLINIC_OR_DEPARTMENT_OTHER): Payer: Medicare Other

## 2015-12-25 ENCOUNTER — Ambulatory Visit (HOSPITAL_BASED_OUTPATIENT_CLINIC_OR_DEPARTMENT_OTHER): Payer: Medicare Other | Admitting: Hematology

## 2015-12-25 VITALS — BP 114/49 | HR 60 | Temp 97.5°F | Resp 17 | Ht 63.0 in | Wt 131.7 lb

## 2015-12-25 VITALS — BP 125/52 | HR 58 | Temp 97.7°F | Resp 16

## 2015-12-25 DIAGNOSIS — C187 Malignant neoplasm of sigmoid colon: Secondary | ICD-10-CM

## 2015-12-25 DIAGNOSIS — R5382 Chronic fatigue, unspecified: Secondary | ICD-10-CM

## 2015-12-25 DIAGNOSIS — R740 Nonspecific elevation of levels of transaminase and lactic acid dehydrogenase [LDH]: Secondary | ICD-10-CM

## 2015-12-25 DIAGNOSIS — C772 Secondary and unspecified malignant neoplasm of intra-abdominal lymph nodes: Secondary | ICD-10-CM

## 2015-12-25 DIAGNOSIS — E46 Unspecified protein-calorie malnutrition: Secondary | ICD-10-CM

## 2015-12-25 DIAGNOSIS — N390 Urinary tract infection, site not specified: Secondary | ICD-10-CM | POA: Diagnosis not present

## 2015-12-25 DIAGNOSIS — Z5112 Encounter for antineoplastic immunotherapy: Secondary | ICD-10-CM | POA: Diagnosis not present

## 2015-12-25 DIAGNOSIS — R112 Nausea with vomiting, unspecified: Secondary | ICD-10-CM

## 2015-12-25 DIAGNOSIS — C186 Malignant neoplasm of descending colon: Secondary | ICD-10-CM

## 2015-12-25 DIAGNOSIS — C799 Secondary malignant neoplasm of unspecified site: Secondary | ICD-10-CM

## 2015-12-25 DIAGNOSIS — G4700 Insomnia, unspecified: Secondary | ICD-10-CM | POA: Diagnosis not present

## 2015-12-25 DIAGNOSIS — K8020 Calculus of gallbladder without cholecystitis without obstruction: Secondary | ICD-10-CM | POA: Diagnosis not present

## 2015-12-25 LAB — COMPREHENSIVE METABOLIC PANEL
ALT: <9 U/L (ref 0–55)
AST: 11 U/L (ref 5–34)
Albumin: 3.3 g/dL — ABNORMAL LOW (ref 3.5–5.0)
Alkaline Phosphatase: 81 U/L (ref 40–150)
Anion Gap: 11 mEq/L (ref 3–11)
BUN: 18.5 mg/dL (ref 7.0–26.0)
CHLORIDE: 107 meq/L (ref 98–109)
CO2: 21 meq/L — AB (ref 22–29)
Calcium: 9.3 mg/dL (ref 8.4–10.4)
Creatinine: 1.1 mg/dL (ref 0.6–1.1)
EGFR: 46 mL/min/{1.73_m2} — AB (ref >90)
GLUCOSE: 104 mg/dL (ref 70–140)
POTASSIUM: 4.2 meq/L (ref 3.5–5.1)
SODIUM: 138 meq/L (ref 136–145)
Total Bilirubin: <0.3 mg/dL (ref 0.20–1.20)
Total Protein: 6.9 g/dL (ref 6.4–8.3)

## 2015-12-25 LAB — URINALYSIS, MICROSCOPIC - CHCC
Bilirubin (Urine): NEGATIVE
Blood: NEGATIVE
Glucose: NEGATIVE mg/dL
KETONES: NEGATIVE mg/dL
Leukocyte Esterase: NEGATIVE
Nitrite: NEGATIVE
Protein: NEGATIVE mg/dL
SPECIFIC GRAVITY, URINE: 1.025 (ref 1.003–1.035)
UROBILINOGEN UR: 0.2 mg/dL (ref 0.2–1)
pH: 5 (ref 4.6–8.0)

## 2015-12-25 LAB — CBC WITH DIFFERENTIAL/PLATELET
BASO%: 1.3 % (ref 0.0–2.0)
BASOS ABS: 0.1 10*3/uL (ref 0.0–0.1)
EOS ABS: 0.3 10*3/uL (ref 0.0–0.5)
EOS%: 6.3 % (ref 0.0–7.0)
HEMATOCRIT: 37.3 % (ref 34.8–46.6)
HGB: 12 g/dL (ref 11.6–15.9)
LYMPH#: 2.2 10*3/uL (ref 0.9–3.3)
LYMPH%: 41.2 % (ref 14.0–49.7)
MCH: 27.5 pg (ref 25.1–34.0)
MCHC: 32.2 g/dL (ref 31.5–36.0)
MCV: 85.6 fL (ref 79.5–101.0)
MONO#: 0.4 10*3/uL (ref 0.1–0.9)
MONO%: 8 % (ref 0.0–14.0)
NEUT#: 2.3 10*3/uL (ref 1.5–6.5)
NEUT%: 43.2 % (ref 38.4–76.8)
PLATELETS: 238 10*3/uL (ref 145–400)
RBC: 4.36 10*6/uL (ref 3.70–5.45)
RDW: 15.3 % — ABNORMAL HIGH (ref 11.2–14.5)
WBC: 5.4 10*3/uL (ref 3.9–10.3)

## 2015-12-25 LAB — TSH: TSH: 2.2 m(IU)/L (ref 0.308–3.960)

## 2015-12-25 MED ORDER — METHYLPREDNISOLONE SODIUM SUCC 40 MG IJ SOLR
INTRAMUSCULAR | Status: AC
  Filled 2015-12-25: qty 1

## 2015-12-25 MED ORDER — SODIUM CHLORIDE 0.9 % IV SOLN: Freq: Once | INTRAVENOUS | Status: DC

## 2015-12-25 MED ORDER — SODIUM CHLORIDE 0.9 % IV SOLN
2.0000 mg/kg | Freq: Once | INTRAVENOUS | Status: AC
  Administered 2015-12-25: 125 mg via INTRAVENOUS
  Filled 2015-12-25: qty 5

## 2015-12-25 MED ORDER — METHYLPREDNISOLONE SODIUM SUCC 40 MG IJ SOLR
40.0000 mg | Freq: Once | INTRAMUSCULAR | Status: AC
  Administered 2015-12-25: 40 mg via INTRAVENOUS

## 2015-12-25 MED ORDER — TRAMADOL HCL 50 MG PO TABS: 50.0000 mg | ORAL_TABLET | Freq: Three times a day (TID) | ORAL | 0 refills | Status: DC | PRN

## 2015-12-25 MED ORDER — SODIUM CHLORIDE 0.9 % IV SOLN
INTRAVENOUS | Status: AC
  Administered 2015-12-25: 12:00:00 via INTRAVENOUS

## 2015-12-25 NOTE — Patient Instructions (Signed)
[facility] Discharge Instructions for Patients Receiving Chemotherapy  Today you received the following chemotherapy agents: Keytruda   To help prevent nausea and vomiting after your treatment, we encourage you to take your nausea medication as directed    If you develop nausea and vomiting that is not controlled by your nausea medication, call the clinic.   BELOW ARE SYMPTOMS THAT SHOULD BE REPORTED IMMEDIATELY:  *FEVER GREATER THAN 100.5 F  *CHILLS WITH OR WITHOUT FEVER  NAUSEA AND VOMITING THAT IS NOT CONTROLLED WITH YOUR NAUSEA MEDICATION  *UNUSUAL SHORTNESS OF BREATH  *UNUSUAL BRUISING OR BLEEDING  TENDERNESS IN MOUTH AND THROAT WITH OR WITHOUT PRESENCE OF ULCERS  *URINARY PROBLEMS  *BOWEL PROBLEMS  UNUSUAL RASH Items with * indicate a potential emergency and should be followed up as soon as possible.  Feel free to call the clinic you have any questions or concerns. The clinic phone number is [phone].  Please show the CHEMO ALERT CARD at check-in to the Emergency Department and triage nurse.   

## 2015-12-25 NOTE — Telephone Encounter (Signed)
Gave relative avs report and appointments for September  °

## 2015-12-25 NOTE — Progress Notes (Signed)
[facility]  [phone] [phone]  Clinic Follow up Note   Patient Care Team: Stacy J Burns, MD as PCP - General (Internal Medicine)  , MD as Consulting Physician (Hematology) Henk Cornett, MD as Consulting Physician (General Surgery) Daniel P Jacobs, MD as Attending Physician (Gastroenterology) 12/25/2015   CHIEF COMPLAINTS:  Follow up metastatic colon cancer   HISTORY OF PRESENTING ILLNESS(05/18/2015):  Joann Herrera 82 y.o. female is here because of recently diagnosed colon cancer. She is accompanied by her daughter and son to the clinic today.  She has had upper abdominal pain and intermittent nausea and vomiting for almost 2 years, she had multiple ED visit and hospitalization. She describes her pain is located in the epigastric area, usually happens after meals, 5/10 , radiates to back, with associated nausea and vomiting. She has moderate fatigue, she is no energy to do much activity except self cares. She has intermittent dizziness, especially when she stands up. Her appetite is low, she eats small meals. she had intermittent diarrhea for a few years with lossoe BM, but recently developed mild to moderate constipation, she lasot about 15 lbs in the past few years   She was referred to gastroenterologist Dr. Jacobs, and underwent EGD on 01/22/2015, which was unremarkable. Abdominal ultrasound on 03/17/2015 showed gallbladder stone, and a 3.3 cm hypoechoic mass in the portal hepatitis. She subsequently underwent abdominal MRI on 04/30/2015, which showed a 2 cm short axis node in the porta hepatis and additional 1.4 cm port cable node, with central necrosis. She underwent a colonoscopy on 04/30/2015, which showed multiple polyps and a malignant appearing mass in the sigmoid colon, the biopsy confirmed adenocarcinoma. She was seen by surgeon Dr. Cornett.  CURRENT THERAPY: Keytruda 200mg iv every 3 weeks started on 07/08/2015, dose reduced to  2mg/kg from cycle 2 on 08/31/15 due to severe reaction after first infusion  INTERIM HISTORY: Sugey returns for follow-up. She is accompanied by her son Mark today. She had left side abdominal pain in the left flank pain for the past few weeks, she describes as persistent pain, moderate to severe, she took a tramadol, the pain has resolved a few days ago. She also had diarrhea for 3 days last week, no fever, nausea, or other new symptoms. Her right side abdominal pain has improved lately. Her appetite is decent, she is eating well, may not be drinking enough. No other new complaints. Her weight is stable.   MEDICAL HISTORY:  Past Medical History:  Diagnosis Date  . Cancer (HCC)    squamous cell  . Colon carcinoma (HCC)    dx. left Colon cancer,evidence of metastasis  . Complication of anesthesia    "morphine caused severe shakes"  . GERD (gastroesophageal reflux disease)   . Macular degeneration   . Pneumonia 04/2012  . PONV (postoperative nausea and vomiting)   . Rheumatoid Arthritis     SURGICAL HISTORY: Past Surgical History:  Procedure Laterality Date  . CATARACT EXTRACTION, BILATERAL    . Cataracts    . COLONOSCOPY WITH PROPOFOL N/A 04/30/2015   Procedure: COLONOSCOPY WITH PROPOFOL;  Surgeon: Daniel P Jacobs, MD;  Location: [facility];  Service: Endoscopy;  Laterality: N/A;  . ESOPHAGOGASTRODUODENOSCOPY N/A 01/22/2015   Procedure: ESOPHAGOGASTRODUODENOSCOPY (EGD);  Surgeon: Daniel P Jacobs, MD;  Location: [facility];  Service: Endoscopy;  Laterality: N/A;  . EYE SURGERY     bilateral cataracts  . HAND SURGERY     Pt states she had both hands operated   on for arthritis.  . HAND SURGERY    . HEMORRHOID SURGERY    . JOINT REPLACEMENT    . MINOR HEMORRHOIDECTOMY    . SHOULDER ARTHROSCOPY Left   . SHOULDER SURGERY Left    arthroscopy  . TONSILLECTOMY    . TOTAL KNEE ARTHROPLASTY     x 2  . TOTAL KNEE ARTHROPLASTY Right   . TOTAL KNEE ARTHROPLASTY Left     SOCIAL  HISTORY: Social History   Social History  . Marital status: Widowed    Spouse name: N/A  . Number of children: 3  . Years of education: N/A   Occupational History  . Retired    Social History Main Topics  . Smoking status: Current Every Day Smoker    Packs/day: 1.00  . Smokeless tobacco: Never Used  . Alcohol use No  . Drug use: No  . Sexual activity: Not on file   Other Topics Concern  . 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 Arthritis Mother   . Arthritis-Osteo Mother   . Cancer Sister 61    breast  . Diabetes Sister   . Diabetes Brother   . Suicidality Brother   . Diabetes Brother   . Breast cancer Sister   . Cancer Maternal Aunt     breast cancer     ALLERGIES:  is allergic to aspirin; codeine; morphine and related; and penicillins.  MEDICATIONS:  Current Outpatient Prescriptions  Medication Sig Dispense Refill  . acetaminophen (TYLENOL) 500 MG tablet Take 1,000 mg by mouth every 6 (six) hours as needed. For pain    . ondansetron (ZOFRAN) 8 MG tablet Take 1 tablet (8 mg total) by mouth every 8 (eight) hours as needed for nausea. May fill with ODT if cost not prohibitive. 30 tablet 3  . ondansetron (ZOFRAN-ODT) 8 MG disintegrating tablet Take 8 mg by mouth 3 (three) times daily as needed. For nausea.  3  . Polyethyl Glycol-Propyl Glycol (SYSTANE OP) Apply 1-2 drops to eye 4 (four) times daily as needed (for dry eye).    . traMADol (ULTRAM) 50 MG tablet Take 1 tablet (50 mg total) by mouth every 8 (eight) hours as needed. 90 tablet 0  . prochlorperazine  (COMPAZINE) 10 MG tablet Take 1 tablet (10 mg total) by mouth every 6 (six) hours as needed. (Patient not taking: Reported on 12/25/2015) 30 tablet 3   No current facility-administered medications for this visit.     REVIEW OF SYSTEMS:   Constitutional: Denies fevers, chills or abnormal night sweats Eyes: Denies blurriness of vision, double vision or watery eyes Ears, nose, mouth, throat, and face: Denies mucositis or sore throat Respiratory: Denies cough, dyspnea or wheezes Cardiovascular: Denies palpitation, chest discomfort or lower extremity swelling Gastrointestinal:  Denies nausea, heartburn or change in bowel habits Skin: Denies abnormal skin rashes Lymphatics: Denies new lymphadenopathy or easy bruising Neurological:Denies numbness, tingling or new weaknesses Behavioral/Psych: Mood is stable, no new changes  All   other systems were reviewed with the patient and are negative.  PHYSICAL EXAMINATION: ECOG PERFORMANCE STATUS: 2-3  Vitals:   12/25/15 0958  BP: (!) 114/49  Pulse: 60  Resp: 17  Temp: 97.5 F (36.4 C)   Filed Weights   12/25/15 0958  Weight: 131 lb 11.2 oz (59.7 kg)    GENERAL:alert, no distress and comfortable SKIN: skin color, texture, turgor are normal, no rashes or significant lesions EYES: normal, conjunctiva are pink and non-injected, sclera clear OROPHARYNX:no exudate, no erythema and lips, buccal mucosa, and tongue normal  NECK: supple, thyroid normal size, non-tender, without nodularity LYMPH:  no palpable lymphadenopathy in the cervical, axillary or inguinal LUNGS: clear to auscultation and percussion with normal breathing effort HEART: regular rate & rhythm and no murmurs and no lower extremity edema ABDOMEN:abdomen soft, mild tenderness at RUQ and epigastric area, normal bowel sounds Musculoskeletal:no cyanosis of digits and no clubbing  PSYCH: alert & oriented x 3 with fluent speech NEURO: no focal motor/sensory deficits  LABORATORY DATA:  I  have reviewed the data as listed CBC Latest Ref Rng & Units 12/25/2015 12/04/2015 11/10/2015  WBC 3.9 - 10.3 10e3/uL 5.4 5.2 5.4  Hemoglobin 11.6 - 15.9 g/dL 12.0 11.6 12.2  Hematocrit 34.8 - 46.6 % 37.3 35.6 37.4  Platelets 145 - 400 10e3/uL 238 273 266    CMP Latest Ref Rng & Units 12/25/2015 12/04/2015 11/10/2015  Glucose 70 - 140 mg/dl 104 95 86  BUN 7.0 - 26.0 mg/dL 18.5 20.0 15.8  Creatinine 0.6 - 1.1 mg/dL 1.1 0.9 1.0  Sodium 136 - 145 mEq/L 138 140 141  Potassium 3.5 - 5.1 mEq/L 4.2 4.0 3.9  Chloride 101 - 111 mmol/L - - -  CO2 22 - 29 mEq/L 21(L) 21(L) 22  Calcium 8.4 - 10.4 mg/dL 9.3 9.3 9.0  Total Protein 6.4 - 8.3 g/dL 6.9 7.1 7.1  Total Bilirubin 0.20 - 1.20 mg/dL <0.30 <0.30 0.37  Alkaline Phos 40 - 150 U/L 81 103 282(H)  AST 5 - 34 U/L 11 11 128(H)  ALT 0 - 55 U/L <9 <9 197(H)    PATHOLOGY REPORT  Diagnosis 04/29/2014 1. Colon, biopsy, ascending - TUBULAR ADENOMA. NO HIGH GRADE DYSPLASIA OR MALIGNANCY IDENTIFIED. 2. Colon, biopsy, 35 cm sigmoid mass r/o malignancy - POORLY DIFFERENTIATED INVASIVE ADENOCARCINOMA. 3. Colon, polyp(s), sigmoid - TUBULAR ADENOMA. NO HIGH GRADE DYSPLASIA OR MALIGNANCY IDENTIFIED.   RADIOGRAPHIC STUDIES: I have personally reviewed the radiological images as listed and agreed with the findings in the report. No results found.  ASSESSMENT & PLAN:  82 year old female, with past medical history of rheumatoid arthritis, macular degeneration, and squamous cell skin cancer, presented with epigastric pain, nausea and weight loss.  1. Sigmoid colon cancer, cTxNxM1, clinical stage IV with periportal node metastases, poorly differentiated adenocarcinoma, MSI-high  -I previously reviewed her colonoscopy findings and pathology results from the biopsies. -She has no significant regional lymphadenopathy, but has two large necrotic appearing periportal nodes, which are very suspicious for metastasis. -I reviewed her PET scan findings with patient and her  family members, which showed hypermetabolic periportal lymph nodes and sigmoid colon mass, no other metastasis. No other primary tumor was seen on the PET scan, I think the PET portal lymphadenopathy are likely distant metastasis from her colon cancer. -She previously declined hospice -She is on first line Keytruda, dosed reduced from cycle 2 due to severe reaction to first cycle. She tolerated the low dose better. -I discussed her restaging CT scan findings from  10/16/2015, which showed a stable disease overall. No new lesions. -She is clinically stable, wishes to continue current treatment. lab reviewed, her transaminitis has resolved, will proceed treatment today  2. Left frank and abdominal pain -she had right side abdominal pain due to her metastatic cancer, and cholithiasis  -Her had left side and frank pain for a few weeks, resolved now. I'll check her urine to ruled out UTI and hematuria. ? Passed kidney stone -continue tramadol as needed.  - she refuses narcotics, she had bad experience with morphine and codein in the past.  3. Nausea and vomiting -continue Zofran as need, she did not tolerate the Compazine well, had dizziness  4. Fatigue and malnutrition -I encouraged her to continue ensure 2-3 bottles a day -follow up with dietitian  -her weight is stable    5. Insomnia -she could not tolerate mirtazapine, off now  6. Cholelithiasis and transaminitis -Her transaminitis is likely secondary to her cholelithiasis -I recommend her to avoid fatty meals -Due to her underlying metastatic colon cancer, and advanced age, she is not a good candidate for cholecystectomy  Plan: -Lab reviewed, adequate for treatment, we'll proceed Kaytruda, with same low dose at 48m/kg, NS 5044mwith chemo for hydration  -UA and urine culture today  -I'll see her back in 3 weeks with lab and Keytruda   All questions were answered. The patient knows to call the clinic with any problems, questions or  concerns.  I spent 20 minutes counseling the patient face to face. The total time spent in the appointment was 25 minutes and more than 50% was on counseling.     FeTruitt MerleMD 12/25/2015

## 2015-12-26 ENCOUNTER — Telehealth: Payer: Self-pay | Admitting: Hematology

## 2015-12-26 NOTE — Telephone Encounter (Signed)
I called pt regarding her urinalysis result, which showed a few WBC, rare RBC, other wise negative. Urine culture is still pending. I do not recommend antibiotics for now. Pt voiced good understanding and appreciated the call.  Joann Herrera  12/26/2015

## 2015-12-28 LAB — URINE CULTURE

## 2016-01-01 ENCOUNTER — Other Ambulatory Visit: Payer: Self-pay | Admitting: Hematology

## 2016-01-02 ENCOUNTER — Ambulatory Visit (HOSPITAL_BASED_OUTPATIENT_CLINIC_OR_DEPARTMENT_OTHER): Payer: Medicare Other

## 2016-01-02 VITALS — BP 123/53 | HR 55 | Temp 98.2°F | Resp 16

## 2016-01-02 DIAGNOSIS — C186 Malignant neoplasm of descending colon: Secondary | ICD-10-CM | POA: Diagnosis not present

## 2016-01-02 MED ORDER — SODIUM CHLORIDE 0.9 % IV SOLN
Freq: Once | INTRAVENOUS | Status: AC
  Administered 2016-01-02: 09:00:00 via INTRAVENOUS

## 2016-01-02 NOTE — Patient Instructions (Signed)

## 2016-01-15 ENCOUNTER — Other Ambulatory Visit (HOSPITAL_BASED_OUTPATIENT_CLINIC_OR_DEPARTMENT_OTHER): Payer: Medicare Other

## 2016-01-15 ENCOUNTER — Encounter: Payer: Self-pay | Admitting: Hematology

## 2016-01-15 ENCOUNTER — Ambulatory Visit (HOSPITAL_BASED_OUTPATIENT_CLINIC_OR_DEPARTMENT_OTHER): Payer: Medicare Other | Admitting: Hematology

## 2016-01-15 ENCOUNTER — Ambulatory Visit (HOSPITAL_BASED_OUTPATIENT_CLINIC_OR_DEPARTMENT_OTHER): Payer: Medicare Other

## 2016-01-15 ENCOUNTER — Telehealth: Payer: Self-pay | Admitting: Hematology

## 2016-01-15 VITALS — BP 116/48 | HR 63 | Temp 98.0°F | Resp 18 | Ht 63.0 in | Wt 130.7 lb

## 2016-01-15 DIAGNOSIS — C772 Secondary and unspecified malignant neoplasm of intra-abdominal lymph nodes: Secondary | ICD-10-CM | POA: Diagnosis not present

## 2016-01-15 DIAGNOSIS — K8020 Calculus of gallbladder without cholecystitis without obstruction: Secondary | ICD-10-CM

## 2016-01-15 DIAGNOSIS — G4700 Insomnia, unspecified: Secondary | ICD-10-CM | POA: Diagnosis not present

## 2016-01-15 DIAGNOSIS — C186 Malignant neoplasm of descending colon: Secondary | ICD-10-CM

## 2016-01-15 DIAGNOSIS — R530 Neoplastic (malignant) related fatigue: Secondary | ICD-10-CM | POA: Diagnosis not present

## 2016-01-15 DIAGNOSIS — R110 Nausea: Secondary | ICD-10-CM

## 2016-01-15 DIAGNOSIS — R109 Unspecified abdominal pain: Secondary | ICD-10-CM | POA: Diagnosis not present

## 2016-01-15 DIAGNOSIS — Z5112 Encounter for antineoplastic immunotherapy: Secondary | ICD-10-CM

## 2016-01-15 DIAGNOSIS — R740 Nonspecific elevation of levels of transaminase and lactic acid dehydrogenase [LDH]: Secondary | ICD-10-CM

## 2016-01-15 LAB — COMPREHENSIVE METABOLIC PANEL
AST: 10 U/L (ref 5–34)
Albumin: 3.3 g/dL — ABNORMAL LOW (ref 3.5–5.0)
Alkaline Phosphatase: 91 U/L (ref 40–150)
Anion Gap: 9 mEq/L (ref 3–11)
BILIRUBIN TOTAL: 0.3 mg/dL (ref 0.20–1.20)
BUN: 19.5 mg/dL (ref 7.0–26.0)
CO2: 20 meq/L — AB (ref 22–29)
CREATININE: 0.9 mg/dL (ref 0.6–1.1)
Calcium: 8.9 mg/dL (ref 8.4–10.4)
Chloride: 109 mEq/L (ref 98–109)
EGFR: 53 mL/min/{1.73_m2} — ABNORMAL LOW (ref >90)
GLUCOSE: 88 mg/dL (ref 70–140)
Potassium: 4.1 mEq/L (ref 3.5–5.1)
SODIUM: 138 meq/L (ref 136–145)
TOTAL PROTEIN: 7.1 g/dL (ref 6.4–8.3)

## 2016-01-15 LAB — CBC WITH DIFFERENTIAL/PLATELET
BASO%: 0.5 % (ref 0.0–2.0)
BASOS ABS: 0 10*3/uL (ref 0.0–0.1)
EOS%: 4.4 % (ref 0.0–7.0)
Eosinophils Absolute: 0.3 10*3/uL (ref 0.0–0.5)
HCT: 36.8 % (ref 34.8–46.6)
HGB: 12.2 g/dL (ref 11.6–15.9)
LYMPH%: 32.2 % (ref 14.0–49.7)
MCH: 28.3 pg (ref 25.1–34.0)
MCHC: 33.2 g/dL (ref 31.5–36.0)
MCV: 85.4 fL (ref 79.5–101.0)
MONO#: 0.4 10*3/uL (ref 0.1–0.9)
MONO%: 6.5 % (ref 0.0–14.0)
NEUT#: 3.5 10*3/uL (ref 1.5–6.5)
NEUT%: 56.4 % (ref 38.4–76.8)
Platelets: 289 10*3/uL (ref 145–400)
RBC: 4.31 10*6/uL (ref 3.70–5.45)
RDW: 15.2 % — AB (ref 11.2–14.5)
WBC: 6.2 10*3/uL (ref 3.9–10.3)
lymph#: 2 10*3/uL (ref 0.9–3.3)

## 2016-01-15 MED ORDER — METHYLPREDNISOLONE SODIUM SUCC 40 MG IJ SOLR
40.0000 mg | Freq: Once | INTRAMUSCULAR | Status: AC
  Administered 2016-01-15: 40 mg via INTRAVENOUS

## 2016-01-15 MED ORDER — SODIUM CHLORIDE 0.9 % IV SOLN
2.0000 mg/kg | Freq: Once | INTRAVENOUS | Status: AC
  Administered 2016-01-15: 125 mg via INTRAVENOUS
  Filled 2016-01-15: qty 5

## 2016-01-15 MED ORDER — SODIUM CHLORIDE 0.9 % IV SOLN
INTRAVENOUS | Status: AC
  Administered 2016-01-15: 16:00:00 via INTRAVENOUS

## 2016-01-15 MED ORDER — METHYLPREDNISOLONE SODIUM SUCC 40 MG IJ SOLR
INTRAMUSCULAR | Status: AC
  Filled 2016-01-15: qty 1

## 2016-01-15 MED ORDER — SODIUM CHLORIDE 0.9 % IV SOLN: Freq: Once | INTRAVENOUS | Status: DC

## 2016-01-15 MED FILL — traMADol HCL 50 MG TABS: 50 | 30 days supply | Qty: 90 | Fill #0

## 2016-01-15 NOTE — Patient Instructions (Signed)
[facility] Discharge Instructions for Patients Receiving Chemotherapy  Today you received the following chemotherapy agents Keytruda  To help prevent nausea and vomiting after your treatment, we encourage you to take your nausea medication    If you develop nausea and vomiting that is not controlled by your nausea medication, call the clinic.   BELOW ARE SYMPTOMS THAT SHOULD BE REPORTED IMMEDIATELY:  *FEVER GREATER THAN 100.5 F  *CHILLS WITH OR WITHOUT FEVER  NAUSEA AND VOMITING THAT IS NOT CONTROLLED WITH YOUR NAUSEA MEDICATION  *UNUSUAL SHORTNESS OF BREATH  *UNUSUAL BRUISING OR BLEEDING  TENDERNESS IN MOUTH AND THROAT WITH OR WITHOUT PRESENCE OF ULCERS  *URINARY PROBLEMS  *BOWEL PROBLEMS  UNUSUAL RASH Items with * indicate a potential emergency and should be followed up as soon as possible.  Feel free to call the clinic you have any questions or concerns. The clinic phone number is [phone].  Please show the CHEMO ALERT CARD at check-in to the Emergency Department and triage nurse.   

## 2016-01-15 NOTE — Telephone Encounter (Signed)
GAVE PATIENT/RELATIVE AVS REPORT AND APPOINTMENTS FOR October. [facility] WILL CALL RE SCAN.

## 2016-01-15 NOTE — Progress Notes (Signed)
[name]  [phone] [phone]  Clinic Follow up Note   Patient Care Team: [name], MD as PCP - General (Internal Medicine) Truitt Merle, MD as Consulting Physician (Hematology) Erroll Luna, MD as Consulting Physician (General Surgery) Milus Banister, MD as Attending Physician (Gastroenterology) 01/15/2016   CHIEF COMPLAINTS:  Follow up metastatic colon cancer   HISTORY OF PRESENTING ILLNESS(05/18/2015):  Joann Herrera 82 y.o. female is here because of recently diagnosed colon cancer. She is accompanied by her daughter and son to the clinic today.  She has had upper abdominal pain and intermittent nausea and vomiting for almost 2 years, she had multiple ED visit and hospitalization. She describes her pain is located in the epigastric area, usually happens after meals, 5/10 , radiates to back, with associated nausea and vomiting. She has moderate fatigue, she is no energy to do much activity except self cares. She has intermittent dizziness, especially when she stands up. Her appetite is low, she eats small meals. she had intermittent diarrhea for a few years with lossoe BM, but recently developed mild to moderate constipation, she lasot about 15 lbs in the past few years   She was referred to gastroenterologist Dr. Ardis Hughs, and underwent EGD on 01/22/2015, which was unremarkable. Abdominal ultrasound on 03/17/2015 showed gallbladder stone, and a 3.3 cm hypoechoic mass in the portal hepatitis. She subsequently underwent abdominal MRI on 04/30/2015, which showed a 2 cm short axis node in the porta hepatis and additional 1.4 cm port cable node, with central necrosis. She underwent a colonoscopy on 04/30/2015, which showed multiple polyps and a malignant appearing mass in the sigmoid colon, the biopsy confirmed adenocarcinoma. She was seen by surgeon Dr. Brantley Stage.  CURRENT THERAPY: Keytruda '200mg'$  iv every 3 weeks started on 07/08/2015, dose reduced to  '2mg'$ /kg from cycle 2 on 08/31/15 due to severe reaction after first infusion  INTERIM HISTORY: Joann Herrera returns for follow-up. She is accompanied by her son Joann Herrera today. She is doing moderately well at home. Her appetite has improved some, nausea is less frequent than before, she is eating slightly better. She still feel quite fatigued, but able to take care of herself. She lives alone. Her other son brings her food. She still has intermittent right abdominal pain, mild to moderate, tolerable, she does not take much pain medication, is terminal as needed. No other new complaints.   MEDICAL HISTORY:  Past Medical History:  Diagnosis Date  . Cancer (HCC)    squamous cell  . Colon carcinoma ([place])    dx. left Colon cancer,evidence of metastasis  . Complication of anesthesia    "morphine caused severe shakes"  . GERD (gastroesophageal reflux disease)   . Macular degeneration   . Pneumonia 04/2012  . PONV (postoperative nausea and vomiting)   . Rheumatoid Arthritis     SURGICAL HISTORY: Past Surgical History:  Procedure Laterality Date  . CATARACT EXTRACTION, BILATERAL    . Cataracts    . COLONOSCOPY WITH PROPOFOL N/A 04/30/2015   Procedure: COLONOSCOPY WITH PROPOFOL;  Surgeon: Milus Banister, MD;  Location: [facility];  Service: Endoscopy;  Laterality: N/A;  . ESOPHAGOGASTRODUODENOSCOPY N/A 01/22/2015   Procedure: ESOPHAGOGASTRODUODENOSCOPY (EGD);  Surgeon: Milus Banister, MD;  Location: [facility];  Service: Endoscopy;  Laterality: N/A;  . EYE SURGERY     bilateral cataracts  . HAND SURGERY     Pt states she had both hands operated on for arthritis.  [place] HAND SURGERY    .  HEMORRHOID SURGERY    . JOINT REPLACEMENT    . MINOR HEMORRHOIDECTOMY    . SHOULDER ARTHROSCOPY Left   . SHOULDER SURGERY Left    arthroscopy  . TONSILLECTOMY    . TOTAL KNEE ARTHROPLASTY     x 2  . TOTAL KNEE ARTHROPLASTY Right   . TOTAL KNEE ARTHROPLASTY Left     SOCIAL HISTORY: Social History   Social  History  . Marital status: Widowed    Spouse name: N/A  . Number of children: 3  . Years of education: N/A   Occupational History  . Retired    Social History Main Topics  . Smoking status: Current Every Day Smoker    Packs/day: 1.00  . Smokeless tobacco: Never Used  . Alcohol use No  . Drug use: No  . Sexual activity: Not on file   Other Topics Concern  . Not on file   Social History Narrative   ** Merged History Encounter **       Daughter in [place] I[place] Booty [place]) [phone]   Son in [place]   Son in [place]   Retired- Medical sales representative business, worked at [employer] for 28 years   Enjoys dancing and going to [place]   Completed 11th grade   Very poor vision due to macular degeneration          FAMILY HISTORY: Family History  Problem Relation Age of Onset  . Arthritis Mother   . Arthritis-Osteo Mother   . Cancer Sister 69    breast  . Diabetes Sister   . Diabetes Brother   . Suicidality Brother   . Diabetes Brother   . Breast cancer Sister   . Cancer Maternal Aunt     breast cancer     ALLERGIES:  is allergic to aspirin; codeine; morphine and related; and penicillins.  MEDICATIONS:  Current Outpatient Prescriptions  Medication Sig Dispense Refill  . acetaminophen (TYLENOL) 500 MG tablet Take 1,000 mg by mouth every 6 (six) hours as needed. For pain    . ondansetron (ZOFRAN-ODT) 8 MG disintegrating tablet Take 8 mg by mouth 3 (three) times daily as needed. For nausea.  3  . Polyethyl Glycol-Propyl Glycol (SYSTANE OP) Apply 1-2 drops to eye 4 (four) times daily as needed (for dry eye).    . prochlorperazine (COMPAZINE) 10 MG tablet Take 1 tablet (10 mg total) by mouth every 6 (six) hours as needed. 30 tablet 3  . traMADol (ULTRAM) 50 MG tablet Take 1 tablet (50 mg total) by mouth every 8 (eight) hours as needed. 90 tablet 0   No current facility-administered medications for this visit.     REVIEW OF SYSTEMS:     Constitutional: Denies fevers, chills or abnormal night sweats Eyes: Denies blurriness of vision, double vision or watery eyes Ears, nose, mouth, throat, and face: Denies mucositis or sore throat Respiratory: Denies cough, dyspnea or wheezes Cardiovascular: Denies palpitation, chest discomfort or lower extremity swelling Gastrointestinal:  Denies nausea, heartburn or change in bowel habits Skin: Denies abnormal skin rashes Lymphatics: Denies new lymphadenopathy or easy bruising Neurological:Denies numbness, tingling or new weaknesses Behavioral/Psych: Mood is stable, no new changes  All other systems were reviewed with the patient and are negative.  PHYSICAL EXAMINATION: ECOG PERFORMANCE STATUS: 2-3  Vitals:   01/15/16 1409  BP: (!) 116/48  Pulse: 63  Resp: 18  Temp: 98 F (36.7 C)   Filed Weights   01/15/16 1409  Weight: 130  lb 11.2 oz (59.3 kg)    GENERAL:alert, no distress and comfortable SKIN: skin color, texture, turgor are normal, no rashes or significant lesions EYES: normal, conjunctiva are pink and non-injected, sclera clear OROPHARYNX:no exudate, no erythema and lips, buccal mucosa, and tongue normal  NECK: supple, thyroid normal size, non-tender, without nodularity LYMPH:  no palpable lymphadenopathy in the cervical, axillary or inguinal LUNGS: clear to auscultation and percussion with normal breathing effort HEART: regular rate & rhythm and no murmurs and no lower extremity edema ABDOMEN:abdomen soft, mild tenderness at RUQ and epigastric area, normal bowel sounds Musculoskeletal:no cyanosis of digits and no clubbing  PSYCH: alert & oriented x 3 with fluent speech NEURO: no focal motor/sensory deficits  LABORATORY DATA:  I have reviewed the data as listed CBC Latest Ref Rng & Units 01/15/2016 12/25/2015 12/04/2015  WBC 3.9 - 10.3 10e3/uL 6.2 5.4 5.2  Hemoglobin 11.6 - 15.9 g/dL 02.1 40.6 91.4  Hematocrit 34.8 - 46.6 % 36.8 37.3 35.6  Platelets 145 - 400  10e3/uL 289 238 273    CMP Latest Ref Rng & Units 01/15/2016 12/25/2015 12/04/2015  Glucose 70 - 140 mg/dl 88 588 95  BUN 7.0 - 69.1 mg/dL 91.2 44.3 90.9  Creatinine 0.6 - 1.1 mg/dL 0.9 1.1 0.9  Sodium 831 - 145 mEq/L 138 138 140  Potassium 3.5 - 5.1 mEq/L 4.1 4.2 4.0  Chloride 101 - 111 mmol/L - - -  CO2 22 - 29 mEq/L 20(L) 21(L) 21(L)  Calcium 8.4 - 10.4 mg/dL 8.9 9.3 9.3  Total Protein 6.4 - 8.3 g/dL 7.1 6.9 7.1  Total Bilirubin 0.20 - 1.20 mg/dL 8.01 <5.27 <0.70  Alkaline Phos 40 - 150 U/L 91 81 103  AST 5 - 34 U/L 10 11 11   ALT 0 - 55 U/L <9 <9 <9    PATHOLOGY REPORT  Diagnosis 04/29/2014 1. Colon, biopsy, ascending - TUBULAR ADENOMA. NO HIGH GRADE DYSPLASIA OR MALIGNANCY IDENTIFIED. 2. Colon, biopsy, 35 cm sigmoid mass r/o malignancy - POORLY DIFFERENTIATED INVASIVE ADENOCARCINOMA. 3. Colon, polyp(s), sigmoid - TUBULAR ADENOMA. NO HIGH GRADE DYSPLASIA OR MALIGNANCY IDENTIFIED.   RADIOGRAPHIC STUDIES: I have personally reviewed the radiological images as listed and agreed with the findings in the report. No results found.  ASSESSMENT & PLAN:  82 year old female, with past medical history of rheumatoid arthritis, macular degeneration, and squamous cell skin cancer, presented with epigastric pain, nausea and weight loss.  1. Sigmoid colon cancer, cTxNxM1, clinical stage IV with periportal node metastases, poorly differentiated adenocarcinoma, MSI-high  -I previously reviewed her colonoscopy findings and pathology results from the biopsies. -She has no significant regional lymphadenopathy, but has two large necrotic appearing periportal nodes, which are very suspicious for metastasis. -I reviewed her PET scan findings with patient and her family members, which showed hypermetabolic periportal lymph nodes and sigmoid colon mass, no other metastasis. No other primary tumor was seen on the PET scan, I think the PET portal lymphadenopathy are likely distant metastasis from her colon  cancer. -She previously declined hospice -She is on first line Keytruda, dosed reduced from cycle 2 due to severe reaction to first cycle. She tolerated the low dose better. -I discussed her restaging CT scan findings from 10/16/2015, which showed a stable disease overall. No new lesions. -She is clinically stable, wishes to continue current treatment, will continue. -I plan to repeat her staging scan in 2 weeks.  2. Right abdominal pain -she had right side abdominal pain due to her metastatic cancer, and cholithiasis  -  Sided improved lately -continue tramadol as needed.  - she refuses narcotics, she had bad experience with morphine and codein in the past.  3. Nausea and vomiting -continue Zofran as need, she did not tolerate the Compazine well, had dizziness -Improved lately  4. Fatigue and malnutrition -I encouraged her to continue ensure 2-3 bottles a day -follow up with dietitian  -her weight is stable    5. Insomnia -she could not tolerate mirtazapine, off now  6. Cholelithiasis and transaminitis -Her transaminitis is likely secondary to her cholelithiasis -I recommend her to avoid fatty meals -Due to her underlying metastatic colon cancer, and advanced age, she is not a good candidate for cholecystectomy  Plan: -Lab reviewed, adequate for treatment, we'll proceed Kaytruda, with same low dose at '2mg'$ /kg, NS 534m with chemo for hydration  -I'll see her back in 3 weeks with Keytruda infusion, lab and restaging CT CAP with contrast one week before   All questions were answered. The patient knows to call the clinic with any problems, questions or concerns.  I spent 20 minutes counseling the patient face to face. The total time spent in the appointment was 25 minutes and more than 50% was on counseling.     FTruitt Merle MD 01/15/2016

## 2016-01-29 ENCOUNTER — Ambulatory Visit (HOSPITAL_COMMUNITY)
Admission: RE | Admit: 2016-01-29 | Discharge: 2016-01-29 | Disposition: A | Discharge status: Home / Self Care | Payer: Medicare Other | Source: Ambulatory Visit | Attending: Hematology | Admitting: Hematology

## 2016-01-29 ENCOUNTER — Other Ambulatory Visit: Payer: Self-pay | Admitting: Hematology

## 2016-01-29 DIAGNOSIS — J432 Centrilobular emphysema: Secondary | ICD-10-CM | POA: Diagnosis not present

## 2016-01-29 DIAGNOSIS — C186 Malignant neoplasm of descending colon: Secondary | ICD-10-CM

## 2016-01-29 DIAGNOSIS — C189 Malignant neoplasm of colon, unspecified: Secondary | ICD-10-CM | POA: Diagnosis not present

## 2016-01-29 DIAGNOSIS — R918 Other nonspecific abnormal finding of lung field: Secondary | ICD-10-CM | POA: Insufficient documentation

## 2016-01-29 DIAGNOSIS — R590 Localized enlarged lymph nodes: Secondary | ICD-10-CM | POA: Insufficient documentation

## 2016-01-29 DIAGNOSIS — I2510 Atherosclerotic heart disease of native coronary artery without angina pectoris: Secondary | ICD-10-CM | POA: Insufficient documentation

## 2016-01-29 DIAGNOSIS — K8020 Calculus of gallbladder without cholecystitis without obstruction: Secondary | ICD-10-CM | POA: Insufficient documentation

## 2016-01-29 DIAGNOSIS — I700 Atherosclerosis of aorta: Secondary | ICD-10-CM | POA: Insufficient documentation

## 2016-02-05 ENCOUNTER — Ambulatory Visit (HOSPITAL_BASED_OUTPATIENT_CLINIC_OR_DEPARTMENT_OTHER): Payer: Medicare Other | Admitting: Hematology

## 2016-02-05 ENCOUNTER — Other Ambulatory Visit (HOSPITAL_BASED_OUTPATIENT_CLINIC_OR_DEPARTMENT_OTHER): Payer: Medicare Other

## 2016-02-05 ENCOUNTER — Ambulatory Visit (HOSPITAL_BASED_OUTPATIENT_CLINIC_OR_DEPARTMENT_OTHER): Payer: Medicare Other

## 2016-02-05 ENCOUNTER — Telehealth: Payer: Self-pay | Admitting: Hematology

## 2016-02-05 VITALS — BP 121/50 | HR 61 | Temp 98.0°F | Resp 17 | Ht 63.0 in | Wt 128.4 lb

## 2016-02-05 VITALS — BP 126/54 | HR 61 | Resp 18

## 2016-02-05 DIAGNOSIS — Z5112 Encounter for antineoplastic immunotherapy: Secondary | ICD-10-CM | POA: Diagnosis not present

## 2016-02-05 DIAGNOSIS — E46 Unspecified protein-calorie malnutrition: Secondary | ICD-10-CM | POA: Diagnosis not present

## 2016-02-05 DIAGNOSIS — G4700 Insomnia, unspecified: Secondary | ICD-10-CM | POA: Diagnosis not present

## 2016-02-05 DIAGNOSIS — G893 Neoplasm related pain (acute) (chronic): Secondary | ICD-10-CM | POA: Diagnosis not present

## 2016-02-05 DIAGNOSIS — R5382 Chronic fatigue, unspecified: Secondary | ICD-10-CM

## 2016-02-05 DIAGNOSIS — C772 Secondary and unspecified malignant neoplasm of intra-abdominal lymph nodes: Secondary | ICD-10-CM

## 2016-02-05 DIAGNOSIS — C187 Malignant neoplasm of sigmoid colon: Secondary | ICD-10-CM | POA: Diagnosis not present

## 2016-02-05 DIAGNOSIS — Z23 Encounter for immunization: Secondary | ICD-10-CM

## 2016-02-05 DIAGNOSIS — R110 Nausea: Secondary | ICD-10-CM

## 2016-02-05 DIAGNOSIS — R530 Neoplastic (malignant) related fatigue: Secondary | ICD-10-CM | POA: Diagnosis not present

## 2016-02-05 DIAGNOSIS — C186 Malignant neoplasm of descending colon: Secondary | ICD-10-CM | POA: Diagnosis not present

## 2016-02-05 DIAGNOSIS — R1013 Epigastric pain: Secondary | ICD-10-CM

## 2016-02-05 LAB — COMPREHENSIVE METABOLIC PANEL
AST: 10 U/L (ref 5–34)
Albumin: 3.4 g/dL — ABNORMAL LOW (ref 3.5–5.0)
Alkaline Phosphatase: 87 U/L (ref 40–150)
Anion Gap: 10 mEq/L (ref 3–11)
BUN: 12.8 mg/dL (ref 7.0–26.0)
CHLORIDE: 107 meq/L (ref 98–109)
CO2: 23 meq/L (ref 22–29)
CREATININE: 0.9 mg/dL (ref 0.6–1.1)
Calcium: 9.1 mg/dL (ref 8.4–10.4)
EGFR: 56 mL/min/{1.73_m2} — ABNORMAL LOW (ref >90)
GLUCOSE: 86 mg/dL (ref 70–140)
POTASSIUM: 4.3 meq/L (ref 3.5–5.1)
SODIUM: 140 meq/L (ref 136–145)
Total Bilirubin: 0.31 mg/dL (ref 0.20–1.20)
Total Protein: 7.2 g/dL (ref 6.4–8.3)

## 2016-02-05 LAB — CBC WITH DIFFERENTIAL/PLATELET
BASO%: 0.7 % (ref 0.0–2.0)
BASOS ABS: 0.1 10*3/uL (ref 0.0–0.1)
EOS ABS: 0.2 10*3/uL (ref 0.0–0.5)
EOS%: 3.3 % (ref 0.0–7.0)
HCT: 38.2 % (ref 34.8–46.6)
HGB: 12.5 g/dL (ref 11.6–15.9)
LYMPH%: 21.3 % (ref 14.0–49.7)
MCH: 28 pg (ref 25.1–34.0)
MCHC: 32.8 g/dL (ref 31.5–36.0)
MCV: 85.5 fL (ref 79.5–101.0)
MONO#: 0.4 10*3/uL (ref 0.1–0.9)
MONO%: 6.1 % (ref 0.0–14.0)
NEUT%: 68.6 % (ref 38.4–76.8)
NEUTROS ABS: 5 10*3/uL (ref 1.5–6.5)
PLATELETS: 350 10*3/uL (ref 145–400)
RBC: 4.47 10*6/uL (ref 3.70–5.45)
RDW: 15.3 % — ABNORMAL HIGH (ref 11.2–14.5)
WBC: 7.3 10*3/uL (ref 3.9–10.3)
lymph#: 1.6 10*3/uL (ref 0.9–3.3)

## 2016-02-05 LAB — TSH: TSH: 0.794 m[IU]/L (ref 0.308–3.960)

## 2016-02-05 MED ORDER — METHYLPREDNISOLONE SODIUM SUCC 40 MG IJ SOLR
INTRAMUSCULAR | Status: AC
  Filled 2016-02-05: qty 1

## 2016-02-05 MED ORDER — SODIUM CHLORIDE 0.9% FLUSH
10.0000 mL | INTRAVENOUS | Status: DC | PRN
  Filled 2016-02-05: qty 10

## 2016-02-05 MED ORDER — SODIUM CHLORIDE 0.9 % IV SOLN
2.0000 mg/kg | Freq: Once | INTRAVENOUS | Status: AC
  Administered 2016-02-05: 125 mg via INTRAVENOUS
  Filled 2016-02-05: qty 5

## 2016-02-05 MED ORDER — METHYLPREDNISOLONE SODIUM SUCC 40 MG IJ SOLR
40.0000 mg | Freq: Once | INTRAMUSCULAR | Status: AC
  Administered 2016-02-05: 40 mg via INTRAVENOUS

## 2016-02-05 MED ORDER — INFLUENZA VAC SPLIT QUAD 0.5 ML IM SUSY
0.5000 mL | PREFILLED_SYRINGE | Freq: Once | INTRAMUSCULAR | Status: AC
  Administered 2016-02-05: 0.5 mL via INTRAMUSCULAR
  Filled 2016-02-05: qty 0.5

## 2016-02-05 MED ORDER — SODIUM CHLORIDE 0.9 % IV SOLN: Freq: Once | INTRAVENOUS | Status: DC

## 2016-02-05 MED ORDER — TRAMADOL HCL 50 MG PO TABS
50.0000 mg | ORAL_TABLET | Freq: Once | ORAL | Status: AC
  Administered 2016-02-05: 50 mg via ORAL

## 2016-02-05 MED ORDER — SODIUM CHLORIDE 0.9 % IV SOLN
INTRAVENOUS | Status: AC
  Administered 2016-02-05: 16:00:00 via INTRAVENOUS

## 2016-02-05 MED ORDER — HEPARIN SOD (PORK) LOCK FLUSH 100 UNIT/ML IV SOLN
500.0000 [IU] | Freq: Once | INTRAVENOUS | Status: DC | PRN
  Filled 2016-02-05: qty 5

## 2016-02-05 NOTE — Progress Notes (Signed)
[facility]  [phone] [phone]  Clinic Follow up Note   Patient Care Team: [name], MD as PCP - General (Internal Medicine) Truitt Merle, MD as Consulting Physician (Hematology) Erroll Luna, MD as Consulting Physician (General Surgery) Milus Banister, MD as Attending Physician (Gastroenterology) 02/05/2016   CHIEF COMPLAINTS:  Follow up metastatic colon cancer   HISTORY OF PRESENTING ILLNESS(05/18/2015):  Joann Herrera 82 y.o. female is here because of recently diagnosed colon cancer. She is accompanied by her daughter and son to the clinic today.  She has had upper abdominal pain and intermittent nausea and vomiting for almost 2 years, she had multiple ED visit and hospitalization. She describes her pain is located in the epigastric area, usually happens after meals, 5/10 , radiates to back, with associated nausea and vomiting. She has moderate fatigue, she is no energy to do much activity except self cares. She has intermittent dizziness, especially when she stands up. Her appetite is low, she eats small meals. she had intermittent diarrhea for a few years with lossoe BM, but recently developed mild to moderate constipation, she lasot about 15 lbs in the past few years   She was referred to gastroenterologist Dr. Ardis Hughs, and underwent EGD on 01/22/2015, which was unremarkable. Abdominal ultrasound on 03/17/2015 showed gallbladder stone, and a 3.3 cm hypoechoic mass in the portal hepatitis. She subsequently underwent abdominal MRI on 04/30/2015, which showed a 2 cm short axis node in the porta hepatis and additional 1.4 cm port cable node, with central necrosis. She underwent a colonoscopy on 04/30/2015, which showed multiple polyps and a malignant appearing mass in the sigmoid colon, the biopsy confirmed adenocarcinoma. She was seen by surgeon Dr. Brantley Stage.  CURRENT THERAPY: Keytruda '200mg'$  iv every 3 weeks started on 07/08/2015, dose reduced  to '2mg'$ /kg from cycle 2 on 08/31/15 due to severe reaction after first infusion  INTERIM HISTORY: Joann Herrera returns for follow-up. She is accompanied by her son Elta Guadeloupe today. She is doing moderately well at home. Her main complain is left low pelvic/hip area pain, which is intermittent and moderate. She also has bilateral leg pain, which is slightly worse than her arthritis pain. Her epigastric discomfort has resolved lately, nausea has been less frequent, her appetite has slightly improved. She is able to live independently. Her son stops by and see her frequently.  MEDICAL HISTORY:  Past Medical History:  Diagnosis Date  . Cancer (HCC)    squamous cell  . Colon carcinoma ([place])    dx. left Colon cancer,evidence of metastasis  . Complication of anesthesia    "morphine caused severe shakes"  . GERD (gastroesophageal reflux disease)   . Macular degeneration   . Pneumonia 04/2012  . PONV (postoperative nausea and vomiting)   . Rheumatoid Arthritis     SURGICAL HISTORY: Past Surgical History:  Procedure Laterality Date  . CATARACT EXTRACTION, BILATERAL    . Cataracts    . COLONOSCOPY WITH PROPOFOL N/A 04/30/2015   Procedure: COLONOSCOPY WITH PROPOFOL;  Surgeon: Milus Banister, MD;  Location: [facility];  Service: Endoscopy;  Laterality: N/A;  . ESOPHAGOGASTRODUODENOSCOPY N/A 01/22/2015   Procedure: ESOPHAGOGASTRODUODENOSCOPY (EGD);  Surgeon: Milus Banister, MD;  Location: [facility];  Service: Endoscopy;  Laterality: N/A;  . EYE SURGERY     bilateral cataracts  . HAND SURGERY     Pt states she had both hands operated on for arthritis.  Marland Kitchen HAND SURGERY    . HEMORRHOID SURGERY    .  JOINT REPLACEMENT    . MINOR HEMORRHOIDECTOMY    . SHOULDER ARTHROSCOPY Left   . SHOULDER SURGERY Left    arthroscopy  . TONSILLECTOMY    . TOTAL KNEE ARTHROPLASTY     x 2  . TOTAL KNEE ARTHROPLASTY Right   . TOTAL KNEE ARTHROPLASTY Left     SOCIAL HISTORY: Social History   Social History  . Marital  status: Widowed    Spouse name: N/A  . Number of children: 3  . Years of education: N/A   Occupational History  . Retired    Social History Main Topics  . Smoking status: Current Every Day Smoker    Packs/day: 1.00  . Smokeless tobacco: Never Used  . Alcohol use No  . Drug use: No  . Sexual activity: Not on file   Other Topics Concern  . Not on file   Social History Narrative   ** Merged History Encounter **       Daughter in [place] Ivin [employer] [employer]) [phone]   Son in [place]   Son in [place]   Retired- Medical sales representative business, worked at [employer] for 28 years   Enjoys dancing and going to [place]   Completed 11th grade   Very poor vision due to macular degeneration          FAMILY HISTORY: Family History  Problem Relation Age of Onset  . Cancer Sister 30    breast  . Diabetes Sister   . Arthritis Mother   . Arthritis-Osteo Mother   . Diabetes Brother   . Suicidality Brother   . Diabetes Brother   . Breast cancer Sister   . Cancer Maternal Aunt     breast cancer     ALLERGIES:  is allergic to aspirin; codeine; morphine and related; and penicillins.  MEDICATIONS:  Current Outpatient Prescriptions  Medication Sig Dispense Refill  . acetaminophen (TYLENOL) 500 MG tablet Take 1,000 mg by mouth every 6 (six) hours as needed. For pain    . ondansetron (ZOFRAN-ODT) 8 MG disintegrating tablet Take 8 mg by mouth 3 (three) times daily as needed. For nausea.  3  . Polyethyl Glycol-Propyl Glycol (SYSTANE OP) Apply 1-2 drops to eye 4 (four) times daily as needed (for dry eye).    . prochlorperazine (COMPAZINE) 10 MG tablet Take 1 tablet (10 mg total) by mouth every 6 (six) hours as needed. 30 tablet 3  . traMADol (ULTRAM) 50 MG tablet Take 1 tablet (50 mg total) by mouth every 8 (eight) hours as needed. 90 tablet 0   No current facility-administered medications for this visit.     REVIEW OF SYSTEMS:   Constitutional: Denies  fevers, chills or abnormal night sweats Eyes: Denies blurriness of vision, double vision or watery eyes Ears, nose, mouth, throat, and face: Denies mucositis or sore throat Respiratory: Denies cough, dyspnea or wheezes Cardiovascular: Denies palpitation, chest discomfort or lower extremity swelling Gastrointestinal:  Denies nausea, heartburn or change in bowel habits Skin: Denies abnormal skin rashes Lymphatics: Denies new lymphadenopathy or easy bruising Neurological:Denies numbness, tingling or new weaknesses Behavioral/Psych: Mood is stable, no new changes  All other systems were reviewed with the patient and are negative.  PHYSICAL EXAMINATION: ECOG PERFORMANCE STATUS: 2-3  Vitals:   02/05/16 1413  BP: (!) 121/50  Pulse: 61  Resp: 17  Temp: 98 F (36.7 C)   Filed Weights   02/05/16 1413  Weight: 128 lb 6.4 oz (58.2 kg)  GENERAL:alert, no distress and comfortable SKIN: skin color, texture, turgor are normal, no rashes or significant lesions EYES: normal, conjunctiva are pink and non-injected, sclera clear OROPHARYNX:no exudate, no erythema and lips, buccal mucosa, and tongue normal  NECK: supple, thyroid normal size, non-tender, without nodularity LYMPH:  no palpable lymphadenopathy in the cervical, axillary or inguinal LUNGS: clear to auscultation and percussion with normal breathing effort HEART: regular rate & rhythm and no murmurs and no lower extremity edema ABDOMEN:abdomen soft, mild tenderness at RUQ and epigastric area, normal bowel sounds Musculoskeletal:no cyanosis of digits and no clubbing  PSYCH: alert & oriented x 3 with fluent speech NEURO: no focal motor/sensory deficits  LABORATORY DATA:  I have reviewed the data as listed CBC Latest Ref Rng & Units 02/05/2016 01/15/2016 12/25/2015  WBC 3.9 - 10.3 10e3/uL 7.3 6.2 5.4  Hemoglobin 11.6 - 15.9 g/dL 12.5 12.2 12.0  Hematocrit 34.8 - 46.6 % 38.2 36.8 37.3  Platelets 145 - 400 10e3/uL 350 289 238    CMP  Latest Ref Rng & Units 02/05/2016 01/15/2016 12/25/2015  Glucose 70 - 140 mg/dl 86 88 104  BUN 7.0 - 26.0 mg/dL 12.8 19.5 18.5  Creatinine 0.6 - 1.1 mg/dL 0.9 0.9 1.1  Sodium 136 - 145 mEq/L 140 138 138  Potassium 3.5 - 5.1 mEq/L 4.3 4.1 4.2  Chloride 101 - 111 mmol/L - - -  CO2 22 - 29 mEq/L 23 20(L) 21(L)  Calcium 8.4 - 10.4 mg/dL 9.1 8.9 9.3  Total Protein 6.4 - 8.3 g/dL 7.2 7.1 6.9  Total Bilirubin 0.20 - 1.20 mg/dL 0.31 0.30 <0.30  Alkaline Phos 40 - 150 U/L 87 91 81  AST 5 - 34 U/L '10 10 11  '$ ALT 0-55 U/L U/L <6 <9 <9    PATHOLOGY REPORT  Diagnosis 04/29/2014 1. Colon, biopsy, ascending - TUBULAR ADENOMA. NO HIGH GRADE DYSPLASIA OR MALIGNANCY IDENTIFIED. 2. Colon, biopsy, 35 cm sigmoid mass r/o malignancy - POORLY DIFFERENTIATED INVASIVE ADENOCARCINOMA. 3. Colon, polyp(s), sigmoid - TUBULAR ADENOMA. NO HIGH GRADE DYSPLASIA OR MALIGNANCY IDENTIFIED.   RADIOGRAPHIC STUDIES: I have personally reviewed the radiological images as listed and agreed with the findings in the report. Ct Abdomen Pelvis Wo Contrast  Result Date: 01/29/2016 CLINICAL DATA:  Stage IV sigmoid colon cancer, poorly differentiated adenocarcinoma. Weight loss and shortness of breath. Right flank pain. EXAM: CT CHEST, ABDOMEN AND PELVIS WITHOUT CONTRAST TECHNIQUE: Multidetector CT imaging of the chest, abdomen and pelvis was performed following the standard protocol without IV contrast. The patient refused IV contrast COMPARISON:  Multiple exams, including 11/09/2015 and 10/16/2015 FINDINGS: CT CHEST FINDINGS Cardiovascular: Coronary, aortic arch, and branch vessel atherosclerotic vascular disease. Mediastinum/Nodes: Right lower paratracheal node 0.9 cm in short axis on image 21/2, unchanged. Lungs/Pleura: Centrilobular emphysema. Mild scarring anteriorly in the right middle lobe. There is some confluent interstitial accentuation peripherally in both lower lobes along with new bandlike densities in the posterior basal  segment right lower lobe as on image 114/2. New focal airspace opacity process laterally in the right lower lobe measuring about 2.0 by 1.2 cm on image 103/4, generally triangular and peripheral. Subtle are increased peripheral volume loss/nodular densities anteriorly in the left lower lobe (images 101 through 111 series 4) and in the lingula (as on image 115/4). Strictly speaking it is difficult to completely exclude malignancy but I suspect that these lesions are related to atelectasis or inflammation. Sub solid 4 mm ground-glass density nodule in the left upper lobe on image 65/4, no change from  10/16/2015 Musculoskeletal: Chronic compression fracture at T7. CT ABDOMEN PELVIS FINDINGS Hepatobiliary: Cholelithiasis. No large liver lesion, smaller lesions could be occult due to lack of IV contrast. Pancreas: Unremarkable Spleen: Unremarkable Adrenals/Urinary Tract: Unremarkable Stomach/Bowel: The patient has known mass near the rectosigmoid junction is less conspicuous on today' s noncontrast exam. Some of this reduction in conspicuity could be due to improvement but there is also a lack of oral contrast in the rectosigmoid and some generalized wall thickening in this region, for example image 97/2. Sigmoid colon diverticulosis. Vascular/Lymphatic: Aortoiliac atherosclerotic vascular disease. Indistinctly marginated portacaval node proximally 1.7 cm in short axis on image 57/2, formerly 1.8 cm. Nodularity along the upper anterior pancreatic margin is again noted but difficult to measure, generally similar to prior. I do not observe any new adenopathy. Reproductive: Unremarkable Other: No supplemental non-categorized findings. Musculoskeletal: Levoconvex lumbar scoliosis with lumbar spondylosis and degenerative disc disease. IMPRESSION: 1. Essentially stable appearance compared to the prior exam, with continued porta hepatis and portacaval adenopathy. Rectosigmoid mass is slightly less conspicuous but I ascribed  this to the lack of IV contrast. No new metastatic lesions identified on today' s noncontrast exam. 2. There are some new peripheral bandlike in nodular opacities along the margins of the lung bases. I suspect these represent atelectasis or inflammatory findings rather than malignancy, but attention on follow up is suggested. 3. Coronary, aortic arch, and branch vessel atherosclerotic vascular disease. Aortoiliac atherosclerotic vascular disease. 4. Centrilobular emphysema. 5. Cholelithiasis. Electronically Signed   By: Van Clines M.D.   On: 01/29/2016 15:53   Ct Chest Wo Contrast  Result Date: 01/29/2016 CLINICAL DATA:  Stage IV sigmoid colon cancer, poorly differentiated adenocarcinoma. Weight loss and shortness of breath. Right flank pain. EXAM: CT CHEST, ABDOMEN AND PELVIS WITHOUT CONTRAST TECHNIQUE: Multidetector CT imaging of the chest, abdomen and pelvis was performed following the standard protocol without IV contrast. The patient refused IV contrast COMPARISON:  Multiple exams, including 11/09/2015 and 10/16/2015 FINDINGS: CT CHEST FINDINGS Cardiovascular: Coronary, aortic arch, and branch vessel atherosclerotic vascular disease. Mediastinum/Nodes: Right lower paratracheal node 0.9 cm in short axis on image 21/2, unchanged. Lungs/Pleura: Centrilobular emphysema. Mild scarring anteriorly in the right middle lobe. There is some confluent interstitial accentuation peripherally in both lower lobes along with new bandlike densities in the posterior basal segment right lower lobe as on image 114/2. New focal airspace opacity process laterally in the right lower lobe measuring about 2.0 by 1.2 cm on image 103/4, generally triangular and peripheral. Subtle are increased peripheral volume loss/nodular densities anteriorly in the left lower lobe (images 101 through 111 series 4) and in the lingula (as on image 115/4). Strictly speaking it is difficult to completely exclude malignancy but I suspect that  these lesions are related to atelectasis or inflammation. Sub solid 4 mm ground-glass density nodule in the left upper lobe on image 65/4, no change from 10/16/2015 Musculoskeletal: Chronic compression fracture at T7. CT ABDOMEN PELVIS FINDINGS Hepatobiliary: Cholelithiasis. No large liver lesion, smaller lesions could be occult due to lack of IV contrast. Pancreas: Unremarkable Spleen: Unremarkable Adrenals/Urinary Tract: Unremarkable Stomach/Bowel: The patient has known mass near the rectosigmoid junction is less conspicuous on today' s noncontrast exam. Some of this reduction in conspicuity could be due to improvement but there is also a lack of oral contrast in the rectosigmoid and some generalized wall thickening in this region, for example image 97/2. Sigmoid colon diverticulosis. Vascular/Lymphatic: Aortoiliac atherosclerotic vascular disease. Indistinctly marginated portacaval node proximally 1.7 cm in short  axis on image 57/2, formerly 1.8 cm. Nodularity along the upper anterior pancreatic margin is again noted but difficult to measure, generally similar to prior. I do not observe any new adenopathy. Reproductive: Unremarkable Other: No supplemental non-categorized findings. Musculoskeletal: Levoconvex lumbar scoliosis with lumbar spondylosis and degenerative disc disease. IMPRESSION: 1. Essentially stable appearance compared to the prior exam, with continued porta hepatis and portacaval adenopathy. Rectosigmoid mass is slightly less conspicuous but I ascribed this to the lack of IV contrast. No new metastatic lesions identified on today' s noncontrast exam. 2. There are some new peripheral bandlike in nodular opacities along the margins of the lung bases. I suspect these represent atelectasis or inflammatory findings rather than malignancy, but attention on follow up is suggested. 3. Coronary, aortic arch, and branch vessel atherosclerotic vascular disease. Aortoiliac atherosclerotic vascular disease. 4.  Centrilobular emphysema. 5. Cholelithiasis. Electronically Signed   By: Van Clines M.D.   On: 01/29/2016 15:53    ASSESSMENT & PLAN:  82 year old female, with past medical history of rheumatoid arthritis, macular degeneration, and squamous cell skin cancer, presented with epigastric pain, nausea and weight loss.  1. Sigmoid colon cancer, [id number], clinical stage IV with periportal node metastases, poorly differentiated adenocarcinoma, MSI-high  -I previously reviewed her colonoscopy findings and pathology results from the biopsies. -She has no significant regional lymphadenopathy, but has two large necrotic appearing periportal nodes, which are very suspicious for metastasis. -I reviewed her PET scan findings with patient and her family members, which showed hypermetabolic periportal lymph nodes and sigmoid colon mass, no other metastasis. No other primary tumor was seen on the PET scan, I think the PET portal lymphadenopathy are likely distant metastasis from her colon cancer. -She previously declined hospice -She is on first line Keytruda, dosed reduced from cycle 2 due to severe reaction to first cycle. She tolerated the low dose better. -I discussed her restaging CT scan findings from 01/29/2016, which showed a stable disease overall. No new lesions. -She is clinically stable, wishes to continue current treatment, will continue.   2. Right abdominal pain -she had right side abdominal pain due to her metastatic cancer, and cholithiasis  -near resolved lately -continue tramadol as needed.  - she refuses narcotics, she had bad experience with morphine and codein in the past.  3. Nausea and vomiting -continue Zofran as need, she did not tolerate the Compazine well, had dizziness -Improved lately  4. Fatigue and malnutrition -I encouraged her to continue ensure 2-3 bottles a day -follow up with dietitian  -her weight is stable    5. Insomnia -she could not tolerate mirtazapine,  off now  6. Left low pelvic/hip pain and leg pain -She has chronic arthralgia of knee, worse lately, also developed a in the left low pelvic and left hip area -CT scan was negative for bone mets or new lesion in left side abd/pelvic  -She will continue tramadol, will follow up closely  7. Poor IV access -The patient agreed with the port placement, we'll arrange through IR   Plan: -Lab reviewed, adequate for treatment, we'll proceed Kaytruda, with same low dose at '2mg'$ /kg, NS 544m with chemo for hydration  -port placement by IR before next visit  -I'll see her back in 3 weeks with Keytruda infusion  All questions were answered. The patient knows to call the clinic with any problems, questions or concerns.  I spent 20 minutes counseling the patient face to face. The total time spent in the appointment was 25 minutes and more  than 50% was on counseling.     Truitt Merle, MD 02/05/2016

## 2016-02-05 NOTE — Progress Notes (Signed)
1610- Pt very tearful due to multiple iv insertion attempts (x4). Pt also reports LUQ pain. The son states that the pt has been having this pain for a while. Notified Dr.Feng and suggesting to have pt have port placement. [name] came to see pt in infusion and explained port placement process. Pt and son agreeable to have port placement schedule before her next treatment in 3 weeks. Pt also ordered tramadol po for abdominal pain. Pt still tearful but states that she is better now.

## 2016-02-05 NOTE — Patient Instructions (Signed)
[facility] Discharge Instructions for Patients Receiving Chemotherapy  Today you received the following chemotherapy agents Keytruda  To help prevent nausea and vomiting after your treatment, we encourage you to take your nausea medication    If you develop nausea and vomiting that is not controlled by your nausea medication, call the clinic.   BELOW ARE SYMPTOMS THAT SHOULD BE REPORTED IMMEDIATELY:  *FEVER GREATER THAN 100.5 F  *CHILLS WITH OR WITHOUT FEVER  NAUSEA AND VOMITING THAT IS NOT CONTROLLED WITH YOUR NAUSEA MEDICATION  *UNUSUAL SHORTNESS OF BREATH  *UNUSUAL BRUISING OR BLEEDING  TENDERNESS IN MOUTH AND THROAT WITH OR WITHOUT PRESENCE OF ULCERS  *URINARY PROBLEMS  *BOWEL PROBLEMS  UNUSUAL RASH Items with * indicate a potential emergency and should be followed up as soon as possible.  Feel free to call the clinic you have any questions or concerns. The clinic phone number is [phone].  Please show the CHEMO ALERT CARD at check-in to the Emergency Department and triage nurse.   

## 2016-02-05 NOTE — Telephone Encounter (Signed)
Gave patient son avs report and appointments for November

## 2016-02-06 ENCOUNTER — Encounter: Payer: Self-pay | Admitting: Hematology

## 2016-02-11 ENCOUNTER — Other Ambulatory Visit: Payer: Self-pay | Admitting: Radiology

## 2016-02-12 ENCOUNTER — Encounter (HOSPITAL_COMMUNITY): Payer: Self-pay

## 2016-02-12 ENCOUNTER — Other Ambulatory Visit: Payer: Self-pay | Admitting: *Deleted

## 2016-02-12 ENCOUNTER — Other Ambulatory Visit: Payer: Self-pay | Admitting: Hematology

## 2016-02-12 ENCOUNTER — Ambulatory Visit (HOSPITAL_COMMUNITY)
Admission: RE | Admit: 2016-02-12 | Discharge: 2016-02-12 | Disposition: A | Discharge status: Home / Self Care | Payer: Medicare Other | Source: Ambulatory Visit | Attending: Hematology | Admitting: Hematology

## 2016-02-12 DIAGNOSIS — H3530 Unspecified macular degeneration: Secondary | ICD-10-CM | POA: Diagnosis not present

## 2016-02-12 DIAGNOSIS — Z880 Allergy status to penicillin: Secondary | ICD-10-CM | POA: Insufficient documentation

## 2016-02-12 DIAGNOSIS — Z885 Allergy status to narcotic agent status: Secondary | ICD-10-CM | POA: Insufficient documentation

## 2016-02-12 DIAGNOSIS — Z833 Family history of diabetes mellitus: Secondary | ICD-10-CM | POA: Diagnosis not present

## 2016-02-12 DIAGNOSIS — Z803 Family history of malignant neoplasm of breast: Secondary | ICD-10-CM | POA: Diagnosis not present

## 2016-02-12 DIAGNOSIS — C19 Malignant neoplasm of rectosigmoid junction: Secondary | ICD-10-CM | POA: Insufficient documentation

## 2016-02-12 DIAGNOSIS — K219 Gastro-esophageal reflux disease without esophagitis: Secondary | ICD-10-CM | POA: Insufficient documentation

## 2016-02-12 DIAGNOSIS — Z452 Encounter for adjustment and management of vascular access device: Secondary | ICD-10-CM | POA: Diagnosis not present

## 2016-02-12 DIAGNOSIS — F17210 Nicotine dependence, cigarettes, uncomplicated: Secondary | ICD-10-CM | POA: Diagnosis not present

## 2016-02-12 DIAGNOSIS — C186 Malignant neoplasm of descending colon: Secondary | ICD-10-CM

## 2016-02-12 DIAGNOSIS — Z96653 Presence of artificial knee joint, bilateral: Secondary | ICD-10-CM | POA: Diagnosis not present

## 2016-02-12 DIAGNOSIS — M069 Rheumatoid arthritis, unspecified: Secondary | ICD-10-CM | POA: Diagnosis not present

## 2016-02-12 HISTORY — PX: IR GENERIC HISTORICAL: IMG1180011

## 2016-02-12 LAB — BASIC METABOLIC PANEL
Anion gap: 7 (ref 5–15)
BUN: 17 mg/dL (ref 6–20)
CALCIUM: 8.8 mg/dL — AB (ref 8.9–10.3)
CO2: 23 mmol/L (ref 22–32)
CREATININE: 0.9 mg/dL (ref 0.44–1.00)
Chloride: 107 mmol/L (ref 101–111)
GFR, EST NON AFRICAN AMERICAN: 55 mL/min — AB (ref >60)
Glucose, Bld: 89 mg/dL (ref 65–99)
Potassium: 4.2 mmol/L (ref 3.5–5.1)
SODIUM: 137 mmol/L (ref 135–145)

## 2016-02-12 LAB — CBC WITH DIFFERENTIAL/PLATELET
BASOS ABS: 0 10*3/uL (ref 0.0–0.1)
BASOS PCT: 1 %
EOS ABS: 0.5 10*3/uL (ref 0.0–0.7)
EOS PCT: 7 %
HCT: 38 % (ref 36.0–46.0)
Hemoglobin: 12.2 g/dL (ref 12.0–15.0)
LYMPHS PCT: 33 %
Lymphs Abs: 2.1 10*3/uL (ref 0.7–4.0)
MCH: 28.2 pg (ref 26.0–34.0)
MCHC: 32.1 g/dL (ref 30.0–36.0)
MCV: 88 fL (ref 78.0–100.0)
Monocytes Absolute: 0.6 10*3/uL (ref 0.1–1.0)
Monocytes Relative: 10 %
Neutro Abs: 3.1 10*3/uL (ref 1.7–7.7)
Neutrophils Relative %: 49 %
PLATELETS: 143 10*3/uL — AB (ref 150–400)
RBC: 4.32 MIL/uL (ref 3.87–5.11)
RDW: 15.2 % (ref 11.5–15.5)
WBC: 6.3 10*3/uL (ref 4.0–10.5)

## 2016-02-12 LAB — PROTIME-INR
INR: 0.94
PROTHROMBIN TIME: 12.5 s (ref 11.4–15.2)

## 2016-02-12 MED ORDER — CLINDAMYCIN PHOSPHATE 900 MG/50ML IV SOLN
900.0000 mg | INTRAVENOUS | Status: AC
  Administered 2016-02-12: 900 mg via INTRAVENOUS
  Filled 2016-02-12: qty 50

## 2016-02-12 MED ORDER — LIDOCAINE HCL 1 % IJ SOLN
INTRAMUSCULAR | Status: AC | PRN
  Administered 2016-02-12: 5 mL

## 2016-02-12 MED ORDER — HEPARIN SOD (PORK) LOCK FLUSH 100 UNIT/ML IV SOLN
INTRAVENOUS | Status: DC | PRN
  Administered 2016-02-12: 500 [IU] via INTRAVENOUS

## 2016-02-12 MED ORDER — HEPARIN SOD (PORK) LOCK FLUSH 100 UNIT/ML IV SOLN
INTRAVENOUS | Status: AC
  Filled 2016-02-12: qty 5

## 2016-02-12 MED ORDER — MIDAZOLAM HCL 2 MG/2ML IJ SOLN
INTRAMUSCULAR | Status: AC | PRN
  Administered 2016-02-12: 1 mg via INTRAVENOUS

## 2016-02-12 MED ORDER — FENTANYL CITRATE (PF) 100 MCG/2ML IJ SOLN
INTRAMUSCULAR | Status: AC
  Filled 2016-02-12: qty 4

## 2016-02-12 MED ORDER — SODIUM CHLORIDE 0.9 % IV SOLN
INTRAVENOUS | Status: DC
Start: 2016-02-12 — End: 2016-02-13
  Administered 2016-02-12: 12:00:00 via INTRAVENOUS

## 2016-02-12 MED ORDER — LIDOCAINE-EPINEPHRINE (PF) 2 %-1:200000 IJ SOLN
INTRAMUSCULAR | Status: AC | PRN
  Administered 2016-02-12: 10 mL

## 2016-02-12 MED ORDER — LIDOCAINE-PRILOCAINE 2.5-2.5 % EX CREA: 1.0000 "application " | TOPICAL_CREAM | CUTANEOUS | 0 refills | Status: DC | PRN

## 2016-02-12 MED ORDER — MIDAZOLAM HCL 2 MG/2ML IJ SOLN
INTRAMUSCULAR | Status: AC
  Filled 2016-02-12: qty 6

## 2016-02-12 MED ORDER — FENTANYL CITRATE (PF) 100 MCG/2ML IJ SOLN
INTRAMUSCULAR | Status: AC | PRN
  Administered 2016-02-12: 25 ug via INTRAVENOUS

## 2016-02-12 MED ORDER — LIDOCAINE HCL 1 % IJ SOLN
INTRAMUSCULAR | Status: AC
  Filled 2016-02-12: qty 20

## 2016-02-12 MED FILL — LIDOCAINE-PRILOCAINE CREAM: 2.5-2.5 | 20 days supply | Qty: 30 | Fill #0

## 2016-02-12 NOTE — H&P (Signed)
Chief Complaint: Patient was seen in consultation today for port placement at the request of Feng,Yan  Referring Physician(s): Feng,Yan  Supervising Physician: Corrie Mckusick  Patient Status: [facility] - Out-pt  History of Present Illness: Joann Herrera is a 82 y.o. female with metastatic colon cancer. She has been receiving chemotherapy but has been having trouble with IV access recently. She is referred for port placement. PMHx, meds, labs, imaging, allergies reviewed.  Past Medical History:  Diagnosis Date  . Cancer (HCC)    squamous cell  . Colon carcinoma ([place])    dx. left Colon cancer,evidence of metastasis  . Complication of anesthesia    "morphine caused severe shakes"  . GERD (gastroesophageal reflux disease)   . Macular degeneration   . Pneumonia 04/2012  . PONV (postoperative nausea and vomiting)   . Rheumatoid Arthritis     Past Surgical History:  Procedure Laterality Date  . CATARACT EXTRACTION, BILATERAL    . Cataracts    . COLONOSCOPY WITH PROPOFOL N/A 04/30/2015   Procedure: COLONOSCOPY WITH PROPOFOL;  Surgeon: Milus Banister, MD;  Location: [facility] ENDOSCOPY;  Service: Endoscopy;  Laterality: N/A;  . ESOPHAGOGASTRODUODENOSCOPY N/A 01/22/2015   Procedure: ESOPHAGOGASTRODUODENOSCOPY (EGD);  Surgeon: Milus Banister, MD;  Location: [facility];  Service: Endoscopy;  Laterality: N/A;  . EYE SURGERY     bilateral cataracts  . HAND SURGERY     Pt states she had both hands operated on for arthritis.  Marland Kitchen HAND SURGERY    . HEMORRHOID SURGERY    . JOINT REPLACEMENT    . MINOR HEMORRHOIDECTOMY    . SHOULDER ARTHROSCOPY Left   . SHOULDER SURGERY Left    arthroscopy  . TONSILLECTOMY    . TOTAL KNEE ARTHROPLASTY     x 2  . TOTAL KNEE ARTHROPLASTY Right   . TOTAL KNEE ARTHROPLASTY Left     Allergies: Aspirin; Codeine; Morphine and related; and Penicillins  Medications: Prior to Admission medications   Medication Sig Start Date End Date Taking? Authorizing  Provider  acetaminophen (TYLENOL) 500 MG tablet Take 1,000 mg by mouth every 6 (six) hours as needed. For pain   Yes Historical Provider, MD  ondansetron (ZOFRAN-ODT) 8 MG disintegrating tablet Take 8 mg by mouth 3 (three) times daily as needed. For nausea. 08/14/15  Yes Historical Provider, MD  Polyethyl Glycol-Propyl Glycol (SYSTANE OP) Apply 1-2 drops to eye 4 (four) times daily as needed (for dry eye).   Yes Historical Provider, MD  traMADol (ULTRAM) 50 MG tablet Take 1 tablet (50 mg total) by mouth every 8 (eight) hours as needed. 12/25/15  Yes Truitt Merle, MD  prochlorperazine (COMPAZINE) 10 MG tablet Take 1 tablet (10 mg total) by mouth every 6 (six) hours as needed. 07/31/15   Truitt Merle, MD     Family History  Problem Relation Age of Onset  . Cancer Sister 43    breast  . Diabetes Sister   . Arthritis Mother   . Arthritis-Osteo Mother   . Diabetes Brother   . Suicidality Brother   . Diabetes Brother   . Breast cancer Sister   . Cancer Maternal Aunt     breast cancer     Social History   Social History  . Marital status: Widowed    Spouse name: N/A  . Number of children: 3  . Years of education: N/A   Occupational History  . Retired    Social History Main Topics  . Smoking status: Current Every  Day Smoker    Packs/day: 1.00  . Smokeless tobacco: Never Used  . Alcohol use No  . Drug use: No  . Sexual activity: Not Asked   Other Topics Concern  . None   Social History Narrative   ** Merged History Encounter **       Daughter in [place]) [phone]   Son in [place]   Son in [place]   Retired- Medical sales representative business, worked at [employer] for 28 years   Enjoys dancing and going to [place]   Completed 11th grade   Very poor vision due to macular degeneration           Review of Systems: A 12 point ROS discussed and pertinent positives are indicated in the HPI above.  All other systems are negative.  Review of  Systems  Vital Signs: BP (!) 112/52 (BP Location: Right Arm)   Pulse 60   Temp 97.9 F (36.6 C) (Oral)   Resp 18   SpO2 97%   Physical Exam  Constitutional: She is oriented to person, place, and time. She appears well-developed and well-nourished. No distress.  HENT:  Head: Normocephalic.  Mouth/Throat: Oropharynx is clear and moist.  Neck: Normal range of motion. No JVD present. No tracheal deviation present.  Cardiovascular: Normal rate, regular rhythm and normal heart sounds.   Pulmonary/Chest: Effort normal and breath sounds normal. No respiratory distress. She has no wheezes. She has no rales.  Neurological: She is alert and oriented to person, place, and time.  Psychiatric: She has a normal mood and affect. Judgment normal.    Mallampati Score:  MD Evaluation Airway: WNL Heart: WNL Abdomen: WNL Chest/ Lungs: WNL ASA  Classification: 3 Mallampati/Airway Score: Two    Labs:  CBC:  Recent Labs  12/25/15 0942 01/15/16 1347 02/05/16 1341 02/12/16 1135  WBC 5.4 6.2 7.3 6.3  HGB 12.0 12.2 12.5 12.2  HCT 37.3 36.8 38.2 38.0  PLT 238 289 350 143*    COAGS:  Recent Labs  02/12/16 1135  INR 0.94    BMP:  Recent Labs  07/09/15 1201 07/25/15 1319  11/08/15 2352 11/09/15 0035  12/25/15 0942 01/15/16 1347 02/05/16 1341 02/12/16 1135  NA 140 137  < > 140 142  < > 138 138 140 137  K 4.3 4.0  < > 4.3 4.4  < > 4.2 4.1 4.3 4.2  CL 108 106  --  109 109  --   --   --   --  107  CO2 23 23  < > 24  --   < > 21* 20* 23 23  GLUCOSE 93 106*  < > 118* 116*  < > 104 88 86 89  BUN 15 22*  < > 20 18  < > 18.5 19.5 12.8 17  CALCIUM 8.9 8.0*  < > 8.9  --   < > 9.3 8.9 9.1 8.8*  CREATININE 0.91 1.03*  < > 1.10* 1.00  < > 1.1 0.9 0.9 0.90  GFRNONAA 54* 47*  --  43*  --   --   --   --   --  55*  GFRAA >60 54*  --  50*  --   --   --   --   --  >60  < > = values in this interval not displayed.  LIVER FUNCTION TESTS:  Recent Labs  12/04/15 0904 12/25/15 0942  01/15/16 1347 02/05/16 1341  BILITOT <0.30 <  0.30 0.30 0.31  AST 11 11 10 10   ALT <9 <9 <9 <6  ALKPHOS 103 81 91 87  PROT 7.1 6.9 7.1 7.2  ALBUMIN 3.2* 3.3* 3.3* 3.4*    TUMOR MARKERS:  Recent Labs  04/21/15 1440  CEA 4.1  CA199 147.0*    Assessment and Plan: Colon cancer For port placement Labs ok Risks and Benefits discussed with the patient including, but not limited to bleeding, infection, pneumothorax, or fibrin sheath development and need for additional procedures. All of the patient's questions were answered, patient is agreeable to proceed. Consent signed and in chart.    Thank you for this interesting consult.  I greatly enjoyed meeting FADWA DIONICIO and look forward to participating in their care.  A copy of this report was sent to the requesting provider on this date.  Electronically Signed: Ascencion Dike 02/12/2016, 1:15 PM   I spent a total of 20 minutes in face to face in clinical consultation, greater than 50% of which was counseling/coordinating care for port placement

## 2016-02-12 NOTE — Procedures (Signed)
Interventional Radiology Procedure Note  Procedure: Placement of a right IJ approach single lumen PowerPort.  Tip is positioned at the superior cavoatrial junction and catheter is ready for immediate use.  Complications: No immediate Recommendations:  - Ok to shower tomorrow - Do not submerge for 7 days - Routine line care   Signed,  Jermichael Belmares S. Lesslie Mossa, DO    

## 2016-02-12 NOTE — Discharge Instructions (Signed)
Implanted Port Insertion, Care After °Refer to this sheet in the next few weeks. These instructions provide you with information on caring for yourself after your procedure. Your health care provider may also give you more specific instructions. Your treatment has been planned according to current medical practices, but problems sometimes occur. Call your health care provider if you have any problems or questions after your procedure. °WHAT TO EXPECT AFTER THE PROCEDURE °After your procedure, it is typical to have the following:  °· Discomfort at the port insertion site. Ice packs to the area will help. °· Bruising on the skin over the port. This will subside in 3-4 days. °HOME CARE INSTRUCTIONS °· After your port is placed, you will get a manufacturer's information card. The card has information about your port. Keep this card with you at all times.   °· Know what kind of port you have. There are many types of ports available.   °· Wear a medical alert bracelet in case of an emergency. This can help alert health care workers that you have a port.   °· The port can stay in for as long as your health care provider believes it is necessary.   °· A home health care nurse may give medicines and take care of the port.   °· You or a family member can get special training and directions for giving medicine and taking care of the port at home.   °SEEK MEDICAL CARE IF:  °· Your port does not flush or you are unable to get a blood return.   °· You have a fever or chills. °SEEK IMMEDIATE MEDICAL CARE IF: °· You have new fluid or pus coming from your incision.   °· You notice a bad smell coming from your incision site.   °· You have swelling, pain, or more redness at the incision or port site.   °· You have chest pain or shortness of breath. °  °This information is not intended to replace advice given to you by your health care provider. Make sure you discuss any questions you have with your health care provider. °  °Document  Released: 01/30/2013 Document Revised: 04/16/2013 Document Reviewed: 01/30/2013 °Elsevier Interactive Patient Education ©2016 [employer] °Implanted Port Home Guide °An implanted port is a type of central line that is placed under the skin. Central lines are used to provide IV access when treatment or nutrition needs to be given through a person's veins. Implanted ports are used for long-term IV access. An implanted port may be placed because:  °· You need IV medicine that would be irritating to the small veins in your hands or arms.   °· You need long-term IV medicines, such as antibiotics.   °· You need IV nutrition for a long period.   °· You need frequent blood draws for lab tests.   °· You need dialysis.   °Implanted ports are usually placed in the chest area, but they can also be placed in the upper arm, the abdomen, or the leg. An implanted port has two main parts:  °· Reservoir. The reservoir is round and will appear as a small, raised area under your skin. The reservoir is the part where a needle is inserted to give medicines or draw blood.   °· Catheter. The catheter is a thin, flexible tube that extends from the reservoir. The catheter is placed into a large vein. Medicine that is inserted into the reservoir goes into the catheter and then into the vein.   °HOW WILL I CARE FOR MY INCISION SITE? °Do not get the   incision site wet. Bathe or shower as directed by your health care provider.  °HOW IS MY PORT ACCESSED? °Special steps must be taken to access the port:  °· Before the port is accessed, a numbing cream can be placed on the skin. This helps numb the skin over the port site.   °· Your health care provider uses a sterile technique to access the port. °· Your health care provider must put on a mask and sterile gloves. °· The skin over your port is cleaned carefully with an antiseptic and allowed to dry. °· The port is gently pinched between sterile gloves, and a needle is inserted into the  port. °· Only "non-coring" port needles should be used to access the port. Once the port is accessed, a blood return should be checked. This helps ensure that the port is in the vein and is not clogged.   °· If your port needs to remain accessed for a constant infusion, a clear (transparent) bandage will be placed over the needle site. The bandage and needle will need to be changed every week, or as directed by your health care provider.   °· Keep the bandage covering the needle clean and dry. Do not get it wet. Follow your health care provider's instructions on how to take a shower or bath while the port is accessed.   °· If your port does not need to stay accessed, no bandage is needed over the port.   °WHAT IS FLUSHING? °Flushing helps keep the port from getting clogged. Follow your health care provider's instructions on how and when to flush the port. Ports are usually flushed with saline solution or a medicine called heparin. The need for flushing will depend on how the port is used.  °· If the port is used for intermittent medicines or blood draws, the port will need to be flushed:   °· After medicines have been given.   °· After blood has been drawn.   °· As part of routine maintenance.   °· If a constant infusion is running, the port may not need to be flushed.   °HOW LONG WILL MY PORT STAY IMPLANTED? °The port can stay in for as long as your health care provider thinks it is needed. When it is time for the port to come out, surgery will be done to remove it. The procedure is similar to the one performed when the port was put in.  °WHEN SHOULD I SEEK IMMEDIATE MEDICAL CARE? °When you have an implanted port, you should seek immediate medical care if:  °· You notice a bad smell coming from the incision site.   °· You have swelling, redness, or drainage at the incision site.   °· You have more swelling or pain at the port site or the surrounding area.   °· You have a fever that is not controlled with  medicine. °  °This information is not intended to replace advice given to you by your health care provider. Make sure you discuss any questions you have with your health care provider. °  °Document Released: 04/11/2005 Document Revised: 01/30/2013 Document Reviewed: 12/17/2012 °Elsevier Interactive Patient Education ©2016 [employer] ° ° °Moderate Conscious Sedation, Adult, Care After °Refer to this sheet in the next few weeks. These instructions provide you with information on caring for yourself after your procedure. Your health care provider may also give you more specific instructions. Your treatment has been planned according to current medical practices, but problems sometimes occur. Call your health care provider if you have any problems or questions   after your procedure. °WHAT TO EXPECT AFTER THE PROCEDURE  °After your procedure: °· You may feel sleepy, clumsy, and have poor balance for several hours. °· Vomiting may occur if you eat too soon after the procedure. °HOME CARE INSTRUCTIONS °· Do not participate in any activities where you could become injured for at least 24 hours. Do not: °¨ Drive. °¨ Swim. °¨ Ride a bicycle. °¨ Operate heavy machinery. °¨ Cook. °¨ Use power tools. °¨ Climb ladders. °¨ Work from a high place. °· Do not make important decisions or sign legal documents until you are improved. °· If you vomit, drink water, juice, or soup when you can drink without vomiting. Make sure you have little or no nausea before eating solid foods. °· Only take over-the-counter or prescription medicines for pain, discomfort, or fever as directed by your health care provider. °· Make sure you and your family fully understand everything about the medicines given to you, including what side effects may occur. °· You should not drink alcohol, take sleeping pills, or take medicines that cause drowsiness for at least 24 hours. °· If you smoke, do not smoke without supervision. °· If you are feeling better, you  may resume normal activities 24 hours after you were sedated. °· Keep all appointments with your health care provider. °SEEK MEDICAL CARE IF: °· Your skin is pale or bluish in color. °· You continue to feel nauseous or vomit. °· Your pain is getting worse and is not helped by medicine. °· You have bleeding or swelling. °· You are still sleepy or feeling clumsy after 24 hours. °SEEK IMMEDIATE MEDICAL CARE IF: °· You develop a rash. °· You have difficulty breathing. °· You develop any type of allergic problem. °· You have a fever. °MAKE SURE YOU: °· Understand these instructions. °· Will watch your condition. °· Will get help right away if you are not doing well or get worse. °  °This information is not intended to replace advice given to you by your health care provider. Make sure you discuss any questions you have with your health care provider. °  °Document Released: 01/30/2013 Document Revised: 05/02/2014 Document Reviewed: 01/30/2013 °Elsevier Interactive Patient Education ©2016 [employer]. ° °

## 2016-02-23 NOTE — Progress Notes (Signed)
[place]  [phone] [phone]  Clinic Follow up Note   Patient Care Team: Joann Rail, MD as PCP - General (Internal Medicine) Joann Merle, MD as Consulting Physician (Hematology) Joann Luna, MD as Consulting Physician (General Surgery) Joann Banister, MD as Attending Physician (Gastroenterology) 02/26/2016   CHIEF COMPLAINTS:  Follow up metastatic colon cancer   HISTORY OF PRESENTING ILLNESS(05/18/2015):  Joann Herrera 82 y.o. female is here because of recently diagnosed colon cancer. She is accompanied by her daughter and son to the clinic today.  She has had upper abdominal pain and intermittent nausea and vomiting for almost 2 years, she had multiple ED visit and hospitalization. She describes her pain is located in the epigastric area, usually happens after meals, 5/10 , radiates to back, with associated nausea and vomiting. She has moderate fatigue, she is no energy to do much activity except self cares. She has intermittent dizziness, especially when she stands up. Her appetite is low, she eats small meals. she had intermittent diarrhea for a few years with lossoe BM, but recently developed mild to moderate constipation, she lasot about 15 lbs in the past few years   She was referred to gastroenterologist Dr. Ardis Herrera, and underwent EGD on 01/22/2015, which was unremarkable. Abdominal ultrasound on 03/17/2015 showed gallbladder stone, and a 3.3 cm hypoechoic mass in the portal hepatitis. She subsequently underwent abdominal MRI on 04/30/2015, which showed a 2 cm short axis node in the porta hepatis and additional 1.4 cm port cable node, with central necrosis. She underwent a colonoscopy on 04/30/2015, which showed multiple polyps and a malignant appearing mass in the sigmoid colon, the biopsy confirmed adenocarcinoma. She was seen by surgeon Dr. Brantley Herrera.  CURRENT THERAPY: Keytruda 263m iv every 3 weeks started on 07/08/2015, dose reduced to  226mkg from cycle 2 on 08/31/15 due to severe reaction after first infusion  INTERIM HISTORY: MiKamrieturns for follow-up. She is doing about the same, with good days and bad days. She complains of left lower and right upper abdominal pain that started a few weeks ago. It does not hurt daily but when it hurts it is severe. Sometimes she experiences nausea with the abdominal pain. She also notes nausea sometimes when moving her bowels. She denies constipation. She denies blood in stool. Her last bowel movement was on Wednesday. States she usually has a bowel movement daily. She had diarrhea about a week ago which is not her normal. She takes milk of magnesia as needed. She reports continued constant back pain. She complains of headaches as well.   MEDICAL HISTORY:  Past Medical History:  Diagnosis Date  . Cancer (HCC)    squamous cell  . Colon carcinoma ([facility]   dx. left Colon cancer,evidence of metastasis  . Complication of anesthesia    "morphine caused severe shakes"  . GERD (gastroesophageal reflux disease)   . Macular degeneration   . Pneumonia 04/2012  . PONV (postoperative nausea and vomiting)   . Rheumatoid Arthritis     SURGICAL HISTORY: Past Surgical History:  Procedure Laterality Date  . CATARACT EXTRACTION, BILATERAL    . Cataracts    . COLONOSCOPY WITH PROPOFOL N/A 04/30/2015   Procedure: COLONOSCOPY WITH PROPOFOL;  Surgeon: Joann Herrera;  Location: [facility];  Service: Endoscopy;  Laterality: N/A;  . ESOPHAGOGASTRODUODENOSCOPY N/A 01/22/2015   Procedure: ESOPHAGOGASTRODUODENOSCOPY (EGD);  Surgeon: Joann Herrera;  [facility];  Service: Endoscopy;  Laterality: N/A;  . EYE  SURGERY     bilateral cataracts  . HAND SURGERY     Pt states she had both hands operated on for arthritis.  [facility] Kitchen HAND SURGERY    . HEMORRHOID SURGERY    . IR GENERIC HISTORICAL  02/12/2016   IR US GUIDE VASC ACCESS RIGHT 02/12/2016 Joann Mckusick, DO [facility]  . IR GENERIC  HISTORICAL  02/12/2016   IR FLUORO GUIDE PORT INSERTION RIGHT 02/12/2016 Joann Mckusick, DO [facility]  . JOINT REPLACEMENT    . MINOR HEMORRHOIDECTOMY    . SHOULDER ARTHROSCOPY Left   . SHOULDER SURGERY Left    arthroscopy  . TONSILLECTOMY    . TOTAL KNEE ARTHROPLASTY     x 2  . TOTAL KNEE ARTHROPLASTY Right   . TOTAL KNEE ARTHROPLASTY Left     SOCIAL HISTORY: Social History   Social History  . Marital status: Widowed    Spouse name: N/A  . Number of children: 3  . Years of education: N/A   Occupational History  . Retired    Social History Main Topics  . Smoking status: Current Every Day Smoker    Packs/day: 1.00  . Smokeless tobacco: Never Used  . Alcohol use No  . Drug use: No  . Sexual activity: Not on file   Other Topics Concern  . Not on file   Social History Narrative   ** Merged History Encounter **       Daughter in [place] Joann Herrera) [phone]   Son in [place]   Son in [place]   Retired- Medical sales representative business, worked at [employer] for 28 years   Enjoys dancing and going to [employer]   Completed 11th grade   Very poor vision due to macular degeneration          FAMILY HISTORY: Family History  Problem Relation Age of Onset  . Cancer Sister 54    breast  . Diabetes Sister   . Arthritis Mother   . Arthritis-Osteo Mother   . Diabetes Brother   . Suicidality Brother   . Diabetes Brother   . Breast cancer Sister   . Cancer Maternal Aunt     breast cancer     ALLERGIES:  is allergic to aspirin; codeine; morphine and related; and penicillins.  MEDICATIONS:  Current Outpatient Prescriptions  Medication Sig Dispense Refill  . acetaminophen (TYLENOL) 500 MG tablet Take 1,000 mg by mouth every 6 (six) hours as needed. For pain    . lidocaine-prilocaine (EMLA) cream Apply 1 application topically as needed. 30 g 0  . ondansetron (ZOFRAN-ODT) 8 MG disintegrating tablet Take 8 mg by mouth 3 (three) times daily  as needed. For nausea.  3  . Polyethyl Glycol-Propyl Glycol (SYSTANE OP) Apply 1-2 drops to eye 4 (four) times daily as needed (for dry eye).    . prochlorperazine (COMPAZINE) 10 MG tablet Take 1 tablet (10 mg total) by mouth every 6 (six) hours as needed. 30 tablet 3  . traMADol (ULTRAM) 50 MG tablet Take 1 tablet (50 mg total) by mouth every 8 (eight) hours as needed. 90 tablet 0   No current facility-administered medications for this visit.     REVIEW OF SYSTEMS:   Constitutional: Denies fevers, chills or abnormal night sweats Eyes: Denies blurriness of vision, double vision or watery eyes Ears, nose, mouth, throat, and face: Denies mucositis or sore throat Respiratory: Denies cough, dyspnea or wheezes Cardiovascular: Denies palpitation, chest discomfort or lower extremity  swelling Gastrointestinal:  Denies constipation, heartburn or change in bowel habits (+) abdominal pain, intermittent nausea Skin: Denies abnormal skin rashes  Musculoskeletal: (+) back pain Lymphatics: Denies new lymphadenopathy or easy bruising Neurological:Denies numbness, tingling or new weaknesses (+) headaches Behavioral/Psych: Mood is stable, no new changes  All other systems were reviewed with the patient and are negative.   PHYSICAL EXAMINATION: ECOG PERFORMANCE STATUS: 2-3  Vitals:   02/26/16 1310  BP: (!) 110/52  Pulse: 61  Resp: 17  Temp: 98 F (36.7 C)   Filed Weights   02/26/16 1310  Weight: 129 lb 11.2 oz (58.8 kg)    GENERAL:alert, no distress and comfortable, able to get on exam table with assistance SKIN: skin color, texture, turgor are normal, no rashes or significant lesions EYES: normal, conjunctiva are pink and non-injected, sclera clear OROPHARYNX:no exudate, no erythema and lips, buccal mucosa, and tongue normal  NECK: supple, thyroid normal size, non-tender, without nodularity LYMPH:  no palpable lymphadenopathy in the cervical, axillary or inguinal LUNGS: clear to  auscultation and percussion with normal breathing effort HEART: regular rate & rhythm and no murmurs and no lower extremity edema ABDOMEN:abdomen soft (+) RUQ and LLQ tenderness with palpation, low bowel sounds Musculoskeletal:no cyanosis of digits and no clubbing  PSYCH: alert & oriented x 3 with fluent speech NEURO: no focal motor/sensory deficits   LABORATORY DATA:  I have reviewed the data as listed CBC Latest Ref Rng & Units 02/26/2016 02/12/2016 02/05/2016  WBC 3.9 - 10.3 10e3/uL 6.7 6.3 7.3  Hemoglobin 11.6 - 15.9 g/dL 11.8 12.2 12.5  Hematocrit 34.8 - 46.6 % 36.2 38.0 38.2  Platelets 145 - 400 10e3/uL 310 143(L) 350    CMP Latest Ref Rng & Units 02/26/2016 02/12/2016 02/05/2016  Glucose 70 - 140 mg/dl 89 89 86  BUN 7.0 - 26.0 mg/dL 16.9 17 12.8  Creatinine 0.6 - 1.1 mg/dL 0.9 0.90 0.9  Sodium 136 - 145 mEq/L 138 137 140  Potassium 3.5 - 5.1 mEq/L 5.0 4.2 4.3  Chloride 101 - 111 mmol/L - 107 -  CO2 22 - 29 mEq/L _0 Calcium 8.4 - 10.4 mg/dL 9.7 8.8(L) 9.1  Total Protein 6.4 - 8.3 g/dL 7.0 - 7.2  Total Bilirubin 0.20 - 1.20 mg/dL 0.27 - 0.31  Alkaline Phos 40 - 150 U/L 77 - 87  AST 5 - 34 U/L 9 - 10  ALT 0-55 U/L U/L <6 - <6    PATHOLOGY REPORT  Diagnosis 04/29/2014 1. Colon, biopsy, ascending - TUBULAR ADENOMA. NO HIGH GRADE DYSPLASIA OR MALIGNANCY IDENTIFIED. 2. Colon, biopsy, 35 cm sigmoid mass r/o malignancy - POORLY DIFFERENTIATED INVASIVE ADENOCARCINOMA. 3. Colon, polyp(s), sigmoid - TUBULAR ADENOMA. NO HIGH GRADE DYSPLASIA OR MALIGNANCY IDENTIFIED.   RADIOGRAPHIC STUDIES: I have personally reviewed the radiological images as listed and agreed with the findings in the report. Ct Abdomen Pelvis Wo Contrast  Result Date: 01/29/2016 CLINICAL DATA:  Herrera IV sigmoid colon cancer, poorly differentiated adenocarcinoma. Weight loss and shortness of breath. Right flank pain. EXAM: CT CHEST, ABDOMEN AND PELVIS WITHOUT CONTRAST TECHNIQUE: Multidetector CT imaging of  the chest, abdomen and pelvis was performed following the standard protocol without IV contrast. The patient refused IV contrast COMPARISON:  Multiple exams, including 11/09/2015 and 10/16/2015 FINDINGS: CT CHEST FINDINGS Cardiovascular: Coronary, aortic arch, and branch vessel atherosclerotic vascular disease. Mediastinum/Nodes: Right lower paratracheal node 0.9 cm in short axis on image 21/2, unchanged. Lungs/Pleura: Centrilobular emphysema. Mild scarring anteriorly in the right middle lobe.  There is some confluent interstitial accentuation peripherally in both lower lobes along with new bandlike densities in the posterior basal segment right lower lobe as on image 114/2. New focal airspace opacity process laterally in the right lower lobe measuring about 2.0 by 1.2 cm on image 103/4, generally triangular and peripheral. Subtle are increased peripheral volume loss/nodular densities anteriorly in the left lower lobe (images 101 through 111 series 4) and in the lingula (as on image 115/4). Strictly speaking it is difficult to completely exclude malignancy but I suspect that these lesions are related to atelectasis or inflammation. Sub solid 4 mm ground-glass density nodule in the left upper lobe on image 65/4, no change from 10/16/2015 Musculoskeletal: Chronic compression fracture at T7. CT ABDOMEN PELVIS FINDINGS Hepatobiliary: Cholelithiasis. No large liver lesion, smaller lesions could be occult due to lack of IV contrast. Pancreas: Unremarkable Spleen: Unremarkable Adrenals/Urinary Tract: Unremarkable Stomach/Bowel: The patient has known mass near the rectosigmoid junction is less conspicuous on today' s noncontrast exam. Some of this reduction in conspicuity could be due to improvement but there is also a lack of oral contrast in the rectosigmoid and some generalized wall thickening in this region, for example image 97/2. Sigmoid colon diverticulosis. Vascular/Lymphatic: Aortoiliac atherosclerotic vascular  disease. Indistinctly marginated portacaval node proximally 1.7 cm in short axis on image 57/2, formerly 1.8 cm. Nodularity along the upper anterior pancreatic margin is again noted but difficult to measure, generally similar to prior. I do not observe any new adenopathy. Reproductive: Unremarkable Other: No supplemental non-categorized findings. Musculoskeletal: Levoconvex lumbar scoliosis with lumbar spondylosis and degenerative disc disease. IMPRESSION: 1. Essentially stable appearance compared to the prior exam, with continued porta hepatis and portacaval adenopathy. Rectosigmoid mass is slightly less conspicuous but I ascribed this to the lack of IV contrast. No new metastatic lesions identified on today' s noncontrast exam. 2. There are some new peripheral bandlike in nodular opacities along the margins of the lung bases. I suspect these represent atelectasis or inflammatory findings rather than malignancy, but attention on follow up is suggested. 3. Coronary, aortic arch, and branch vessel atherosclerotic vascular disease. Aortoiliac atherosclerotic vascular disease. 4. Centrilobular emphysema. 5. Cholelithiasis. Electronically Signed   By: Van Clines M.D.   On: 01/29/2016 15:53   Ct Chest Wo Contrast  Result Date: 01/29/2016 CLINICAL DATA:  Herrera IV sigmoid colon cancer, poorly differentiated adenocarcinoma. Weight loss and shortness of breath. Right flank pain. EXAM: CT CHEST, ABDOMEN AND PELVIS WITHOUT CONTRAST TECHNIQUE: Multidetector CT imaging of the chest, abdomen and pelvis was performed following the standard protocol without IV contrast. The patient refused IV contrast COMPARISON:  Multiple exams, including 11/09/2015 and 10/16/2015 FINDINGS: CT CHEST FINDINGS Cardiovascular: Coronary, aortic arch, and branch vessel atherosclerotic vascular disease. Mediastinum/Nodes: Right lower paratracheal node 0.9 cm in short axis on image 21/2, unchanged. Lungs/Pleura: Centrilobular emphysema. Mild  scarring anteriorly in the right middle lobe. There is some confluent interstitial accentuation peripherally in both lower lobes along with new bandlike densities in the posterior basal segment right lower lobe as on image 114/2. New focal airspace opacity process laterally in the right lower lobe measuring about 2.0 by 1.2 cm on image 103/4, generally triangular and peripheral. Subtle are increased peripheral volume loss/nodular densities anteriorly in the left lower lobe (images 101 through 111 series 4) and in the lingula (as on image 115/4). Strictly speaking it is difficult to completely exclude malignancy but I suspect that these lesions are related to atelectasis or inflammation. Sub solid 4 mm ground-glass  density nodule in the left upper lobe on image 65/4, no change from 10/16/2015 Musculoskeletal: Chronic compression fracture at T7. CT ABDOMEN PELVIS FINDINGS Hepatobiliary: Cholelithiasis. No large liver lesion, smaller lesions could be occult due to lack of IV contrast. Pancreas: Unremarkable Spleen: Unremarkable Adrenals/Urinary Tract: Unremarkable Stomach/Bowel: The patient has known mass near the rectosigmoid junction is less conspicuous on today' s noncontrast exam. Some of this reduction in conspicuity could be due to improvement but there is also a lack of oral contrast in the rectosigmoid and some generalized wall thickening in this region, for example image 97/2. Sigmoid colon diverticulosis. Vascular/Lymphatic: Aortoiliac atherosclerotic vascular disease. Indistinctly marginated portacaval node proximally 1.7 cm in short axis on image 57/2, formerly 1.8 cm. Nodularity along the upper anterior pancreatic margin is again noted but difficult to measure, generally similar to prior. I do not observe any new adenopathy. Reproductive: Unremarkable Other: No supplemental non-categorized findings. Musculoskeletal: Levoconvex lumbar scoliosis with lumbar spondylosis and degenerative disc disease.  IMPRESSION: 1. Essentially stable appearance compared to the prior exam, with continued porta hepatis and portacaval adenopathy. Rectosigmoid mass is slightly less conspicuous but I ascribed this to the lack of IV contrast. No new metastatic lesions identified on today' s noncontrast exam. 2. There are some new peripheral bandlike in nodular opacities along the margins of the lung bases. I suspect these represent atelectasis or inflammatory findings rather than malignancy, but attention on follow up is suggested. 3. Coronary, aortic arch, and branch vessel atherosclerotic vascular disease. Aortoiliac atherosclerotic vascular disease. 4. Centrilobular emphysema. 5. Cholelithiasis. Electronically Signed   By: Van Clines M.D.   On: 01/29/2016 15:53   Ir US Guide Vasc Access Right  Result Date: 02/12/2016 INDICATION: 82 year old female with a history of metastatic colorectal cancer. She has been referred for port catheter EXAM: IMPLANTED PORT A CATH PLACEMENT WITH ULTRASOUND AND FLUOROSCOPIC GUIDANCE MEDICATIONS: 900 mg Cleocin; The antibiotic was administered within an appropriate time interval prior to skin puncture. ANESTHESIA/SEDATION: Moderate (conscious) sedation was employed during this procedure. A total of Versed 2.0 mg and Fentanyl 50 mcg was administered intravenously. Moderate Sedation Time: 21 minutes. The patient's level of consciousness and vital signs were monitored continuously by radiology nursing throughout the procedure under my direct supervision. FLUOROSCOPY TIME:  Zero minutes, 6 seconds (0.5 mGy) COMPLICATIONS: None PROCEDURE: The procedure, risks, benefits, and alternatives were explained to the patient. Questions regarding the procedure were encouraged and answered. The patient understands and consents to the procedure. Ultrasound survey was performed with images stored and sent to PACs. The right neck and chest was prepped with chlorhexidine, and draped in the usual sterile  fashion using maximum barrier technique (cap and mask, sterile gown, sterile gloves, large sterile sheet, hand hygiene and cutaneous antiseptic). Antibiotic prophylaxis was provided with 900 mg Cleocin administered IV one hour prior to skin incision. Local anesthesia was attained by infiltration with 1% lidocaine without epinephrine. Ultrasound demonstrated patency of the right internal jugular vein, and this was documented with an image. Under real-time ultrasound guidance, this vein was accessed with a 21 gauge micropuncture needle and image documentation was performed. A small dermatotomy was made at the access site with an 11 scalpel. A 0.018" wire was advanced into the SVC and used to estimate the length of the internal catheter. The access needle exchanged for a 45F micropuncture vascular sheath. The 0.018" wire was then removed and a 0.035" wire advanced into the IVC. An appropriate location for the subcutaneous reservoir was selected below the clavicle  and an incision was made through the skin and underlying soft tissues. The subcutaneous tissues were then dissected using a combination of blunt and sharp surgical technique and a pocket was formed. A single lumen power injectable portacatheter was then tunneled through the subcutaneous tissues from the pocket to the dermatotomy and the port reservoir placed within the subcutaneous pocket. The venous access site was then serially dilated and a peel away vascular sheath placed over the wire. The wire was removed and the port catheter advanced into position under fluoroscopic guidance. The catheter tip is positioned in the cavoatrial junction. This was documented with a spot image. The portacatheter was then tested and found to flush and aspirate well. The port was flushed with saline followed by 100 units/mL heparinized saline. The pocket was then closed in two layers using first subdermal inverted interrupted absorbable sutures followed by a running  subcuticular suture. The epidermis was then sealed with Dermabond. The dermatotomy at the venous access site was also seal with Dermabond. Patient tolerated the procedure well and remained hemodynamically stable throughout. No complications encountered and no significant blood loss encountered IMPRESSION: Successful placement of a right internal jugular approach power injectable Port-A-Cath. The catheter is ready for immediate use. Signed, Dulcy Fanny. Earleen Newport, DO [facility] Electronically Signed   By: Joann Herrera D.O.   On: 02/12/2016 18:15   Ir Fluoro Guide Port Insertion Right  Result Date: 02/12/2016 INDICATION: 82 year old female with a history of metastatic colorectal cancer. She has been referred for port catheter EXAM: IMPLANTED PORT A CATH PLACEMENT WITH ULTRASOUND AND FLUOROSCOPIC GUIDANCE MEDICATIONS: 900 mg Cleocin; The antibiotic was administered within an appropriate time interval prior to skin puncture. ANESTHESIA/SEDATION: Moderate (conscious) sedation was employed during this procedure. A total of Versed 2.0 mg and Fentanyl 50 mcg was administered intravenously. Moderate Sedation Time: 21 minutes. The patient's level of consciousness and vital signs were monitored continuously by radiology nursing throughout the procedure under my direct supervision. FLUOROSCOPY TIME:  Zero minutes, 6 seconds (0.5 mGy) COMPLICATIONS: None PROCEDURE: The procedure, risks, benefits, and alternatives were explained to the patient. Questions regarding the procedure were encouraged and answered. The patient understands and consents to the procedure. Ultrasound survey was performed with images stored and sent to PACs. The right neck and chest was prepped with chlorhexidine, and draped in the usual sterile fashion using maximum barrier technique (cap and mask, sterile gown, sterile gloves, large sterile sheet, hand hygiene and cutaneous antiseptic). Antibiotic  prophylaxis was provided with 900 mg Cleocin administered IV one hour prior to skin incision. Local anesthesia was attained by infiltration with 1% lidocaine without epinephrine. Ultrasound demonstrated patency of the right internal jugular vein, and this was documented with an image. Under real-time ultrasound guidance, this vein was accessed with a 21 gauge micropuncture needle and image documentation was performed. A small dermatotomy was made at the access site with an 11 scalpel. A 0.018" wire was advanced into the SVC and used to estimate the length of the internal catheter. The access needle exchanged for a 72F micropuncture vascular sheath. The 0.018" wire was then removed and a 0.035" wire advanced into the IVC. An appropriate location for the subcutaneous reservoir was selected below the clavicle and an incision was made through the skin and underlying soft tissues. The subcutaneous tissues were then dissected using a combination of blunt and sharp surgical technique and a pocket was formed. A single lumen power injectable portacatheter was then tunneled through the  subcutaneous tissues from the pocket to the dermatotomy and the port reservoir placed within the subcutaneous pocket. The venous access site was then serially dilated and a peel away vascular sheath placed over the wire. The wire was removed and the port catheter advanced into position under fluoroscopic guidance. The catheter tip is positioned in the cavoatrial junction. This was documented with a spot image. The portacatheter was then tested and found to flush and aspirate well. The port was flushed with saline followed by 100 units/mL heparinized saline. The pocket was then closed in two layers using first subdermal inverted interrupted absorbable sutures followed by a running subcuticular suture. The epidermis was then sealed with Dermabond. The dermatotomy at the venous access site was also seal with Dermabond. Patient tolerated the  procedure well and remained hemodynamically stable throughout. No complications encountered and no significant blood loss encountered IMPRESSION: Successful placement of a right internal jugular approach power injectable Port-A-Cath. The catheter is ready for immediate use. Signed, Dulcy Fanny. Earleen Newport, DO [facility] Radiology Electronically Signed   By: Joann Herrera D.O.   On: 02/12/2016 18:15    ASSESSMENT & PLAN:  83 year old female, with past medical history of rheumatoid arthritis, macular degeneration, and squamous cell skin cancer, presented with epigastric pain, nausea and weight loss.  1. Sigmoid colon cancer, [id number], clinical Herrera IV with periportal node metastases, poorly differentiated adenocarcinoma, MSI-high  -I previously reviewed her colonoscopy findings and pathology results from the biopsies. -She has no significant regional lymphadenopathy, but has two large necrotic appearing periportal nodes, which are very suspicious for metastasis. -I previously reviewed her PET scan findings with patient and her family members, which showed hypermetabolic periportal lymph nodes and sigmoid colon mass, no other metastasis. No other primary tumor was seen on the PET scan, I think the PET portal lymphadenopathy are likely distant metastasis from her colon cancer. -She previously declined hospice -She is on first line Keytruda, dosed reduced from cycle 2 due to severe reaction to first cycle. She tolerated the low dose better. -I previously discussed her restaging CT scan findings from 01/29/2016, which showed a stable disease overall. No new lesions. -She is clinically stable, wishes to continue current treatment, will continue.   2. Right upper and left low abdominal pain -she has right side abdominal pain due to her metastatic cancer, and cholelithiasis -she has left side abdominal pain possibly related to her primary sigmoid colon mass, ?  Partial obstruction  -continue tramadol as needed.  - she refuses narcotics, she had bad experience with morphine and codein in the past. -Encouraged her to try a stool softener, like colace, once or twice daily to avoid constipation   3. Nausea -continue Zofran as need, she did not tolerate the Compazine well, had dizziness   4. Fatigue and malnutrition -I encouraged her to continue ensure 2-3 bottles a day -follow up with dietitian  -her weight is stable    5. Insomnia -she could not tolerate mirtazapine, off now   Plan: -Lab will be drawn after our visit today, adequate for treatment, we'll proceed Kaytruda, with same low dose at 86m/kg, NS 5068mwith chemo for hydration  -I encouraged her to take Colace, and laxative as needed to avoid constipation -I'll see her back in 3 weeks   All questions were answered. The patient knows to call the clinic with any problems, questions or concerns.  I spent 20 minutes counseling the patient face to face. The total time spent in  the appointment was 25 minutes and more than 50% was on counseling.  This document serves as a record of services personally performed by Joann Merle, MD. It was created on her behalf by Arlyce Harman, a trained medical scribe. The creation of this record is based on the scribe's personal observations and the provider's statements to them. This document has been checked and approved by the attending provider.     Joann Merle, MD 02/26/2016

## 2016-02-26 ENCOUNTER — Other Ambulatory Visit (HOSPITAL_BASED_OUTPATIENT_CLINIC_OR_DEPARTMENT_OTHER): Payer: Medicare Other

## 2016-02-26 ENCOUNTER — Ambulatory Visit (HOSPITAL_BASED_OUTPATIENT_CLINIC_OR_DEPARTMENT_OTHER): Payer: Medicare Other

## 2016-02-26 ENCOUNTER — Encounter: Payer: Self-pay | Admitting: Hematology

## 2016-02-26 ENCOUNTER — Ambulatory Visit (HOSPITAL_BASED_OUTPATIENT_CLINIC_OR_DEPARTMENT_OTHER): Payer: Medicare Other | Admitting: Hematology

## 2016-02-26 VITALS — BP 110/52 | HR 61 | Temp 98.0°F | Resp 17 | Ht 63.0 in | Wt 129.7 lb

## 2016-02-26 DIAGNOSIS — G4700 Insomnia, unspecified: Secondary | ICD-10-CM

## 2016-02-26 DIAGNOSIS — R110 Nausea: Secondary | ICD-10-CM

## 2016-02-26 DIAGNOSIS — R1013 Epigastric pain: Secondary | ICD-10-CM | POA: Diagnosis not present

## 2016-02-26 DIAGNOSIS — R530 Neoplastic (malignant) related fatigue: Secondary | ICD-10-CM | POA: Diagnosis not present

## 2016-02-26 DIAGNOSIS — C187 Malignant neoplasm of sigmoid colon: Secondary | ICD-10-CM

## 2016-02-26 DIAGNOSIS — C186 Malignant neoplasm of descending colon: Secondary | ICD-10-CM

## 2016-02-26 DIAGNOSIS — Z5112 Encounter for antineoplastic immunotherapy: Secondary | ICD-10-CM | POA: Diagnosis not present

## 2016-02-26 DIAGNOSIS — C772 Secondary and unspecified malignant neoplasm of intra-abdominal lymph nodes: Secondary | ICD-10-CM

## 2016-02-26 DIAGNOSIS — E46 Unspecified protein-calorie malnutrition: Secondary | ICD-10-CM | POA: Diagnosis not present

## 2016-02-26 LAB — COMPREHENSIVE METABOLIC PANEL
AST: 9 U/L (ref 5–34)
Albumin: 3.3 g/dL — ABNORMAL LOW (ref 3.5–5.0)
Alkaline Phosphatase: 77 U/L (ref 40–150)
Anion Gap: 7 mEq/L (ref 3–11)
BUN: 16.9 mg/dL (ref 7.0–26.0)
CHLORIDE: 106 meq/L (ref 98–109)
CO2: 25 mEq/L (ref 22–29)
CREATININE: 0.9 mg/dL (ref 0.6–1.1)
Calcium: 9.7 mg/dL (ref 8.4–10.4)
EGFR: 59 mL/min/{1.73_m2} — ABNORMAL LOW (ref >90)
GLUCOSE: 89 mg/dL (ref 70–140)
POTASSIUM: 5 meq/L (ref 3.5–5.1)
SODIUM: 138 meq/L (ref 136–145)
Total Bilirubin: 0.27 mg/dL (ref 0.20–1.20)
Total Protein: 7 g/dL (ref 6.4–8.3)

## 2016-02-26 LAB — CBC WITH DIFFERENTIAL/PLATELET
BASO%: 0.8 % (ref 0.0–2.0)
Basophils Absolute: 0.1 10*3/uL (ref 0.0–0.1)
EOS%: 4.2 % (ref 0.0–7.0)
Eosinophils Absolute: 0.3 10*3/uL (ref 0.0–0.5)
HCT: 36.2 % (ref 34.8–46.6)
HGB: 11.8 g/dL (ref 11.6–15.9)
LYMPH%: 33.3 % (ref 14.0–49.7)
MCH: 28 pg (ref 25.1–34.0)
MCHC: 32.6 g/dL (ref 31.5–36.0)
MCV: 86 fL (ref 79.5–101.0)
MONO#: 0.5 10*3/uL (ref 0.1–0.9)
MONO%: 6.9 % (ref 0.0–14.0)
NEUT#: 3.6 10*3/uL (ref 1.5–6.5)
NEUT%: 54.8 % (ref 38.4–76.8)
Platelets: 310 10*3/uL (ref 145–400)
RBC: 4.21 10*6/uL (ref 3.70–5.45)
RDW: 15.3 % — ABNORMAL HIGH (ref 11.2–14.5)
WBC: 6.7 10*3/uL (ref 3.9–10.3)
lymph#: 2.2 10*3/uL (ref 0.9–3.3)

## 2016-02-26 MED ORDER — METHYLPREDNISOLONE SODIUM SUCC 40 MG IJ SOLR
40.0000 mg | Freq: Once | INTRAMUSCULAR | Status: AC
  Administered 2016-02-26: 40 mg via INTRAVENOUS

## 2016-02-26 MED ORDER — TRAMADOL HCL 50 MG PO TABS: 50.0000 mg | ORAL_TABLET | Freq: Three times a day (TID) | ORAL | 0 refills | Status: DC | PRN

## 2016-02-26 MED ORDER — SODIUM CHLORIDE 0.9 % IV SOLN
INTRAVENOUS | Status: AC
  Administered 2016-02-26: 15:00:00 via INTRAVENOUS

## 2016-02-26 MED ORDER — SODIUM CHLORIDE 0.9 % IV SOLN
2.0000 mg/kg | Freq: Once | INTRAVENOUS | Status: AC
  Administered 2016-02-26: 125 mg via INTRAVENOUS
  Filled 2016-02-26: qty 5

## 2016-02-26 MED ORDER — SODIUM CHLORIDE 0.9% FLUSH
10.0000 mL | INTRAVENOUS | Status: DC | PRN
  Administered 2016-02-26: 10 mL
  Filled 2016-02-26: qty 10

## 2016-02-26 MED ORDER — METHYLPREDNISOLONE SODIUM SUCC 40 MG IJ SOLR
INTRAMUSCULAR | Status: AC
  Filled 2016-02-26: qty 1

## 2016-02-26 MED ORDER — HEPARIN SOD (PORK) LOCK FLUSH 100 UNIT/ML IV SOLN
500.0000 [IU] | Freq: Once | INTRAVENOUS | Status: AC | PRN
  Administered 2016-02-26: 500 [IU]
  Filled 2016-02-26: qty 5

## 2016-02-26 MED ORDER — METHYLPREDNISOLONE SODIUM SUCC 125 MG IJ SOLR
INTRAMUSCULAR | Status: AC
  Filled 2016-02-26: qty 2

## 2016-02-26 MED ORDER — SODIUM CHLORIDE 0.9 % IV SOLN: Freq: Once | INTRAVENOUS | Status: DC

## 2016-02-26 MED FILL — traMADol HCL 50 MG TABS: 50 | 30 days supply | Qty: 90 | Fill #0

## 2016-02-29 ENCOUNTER — Telehealth: Payer: Self-pay | Admitting: *Deleted

## 2016-02-29 NOTE — Telephone Encounter (Signed)
Per LOS I have scheduled appts and notified the scheruler 

## 2016-03-06 ENCOUNTER — Emergency Department (HOSPITAL_COMMUNITY)
Admission: EM | Admit: 2016-03-06 | Discharge: 2016-03-06 | Disposition: A | Discharge status: Home / Self Care | Payer: Medicare Other | Attending: Emergency Medicine | Admitting: Emergency Medicine

## 2016-03-06 ENCOUNTER — Emergency Department (HOSPITAL_COMMUNITY): Payer: Medicare Other

## 2016-03-06 ENCOUNTER — Encounter (HOSPITAL_COMMUNITY): Payer: Self-pay

## 2016-03-06 DIAGNOSIS — E860 Dehydration: Secondary | ICD-10-CM | POA: Diagnosis not present

## 2016-03-06 DIAGNOSIS — R531 Weakness: Secondary | ICD-10-CM | POA: Diagnosis not present

## 2016-03-06 DIAGNOSIS — Z85038 Personal history of other malignant neoplasm of large intestine: Secondary | ICD-10-CM | POA: Diagnosis not present

## 2016-03-06 DIAGNOSIS — Z8673 Personal history of transient ischemic attack (TIA), and cerebral infarction without residual deficits: Secondary | ICD-10-CM | POA: Diagnosis not present

## 2016-03-06 DIAGNOSIS — F17200 Nicotine dependence, unspecified, uncomplicated: Secondary | ICD-10-CM | POA: Insufficient documentation

## 2016-03-06 DIAGNOSIS — Z96653 Presence of artificial knee joint, bilateral: Secondary | ICD-10-CM | POA: Insufficient documentation

## 2016-03-06 DIAGNOSIS — R0602 Shortness of breath: Secondary | ICD-10-CM | POA: Diagnosis not present

## 2016-03-06 LAB — COMPREHENSIVE METABOLIC PANEL
ALBUMIN: 3.6 g/dL (ref 3.5–5.0)
ALK PHOS: 62 U/L (ref 38–126)
ALT: 8 U/L — ABNORMAL LOW (ref 14–54)
ANION GAP: 8 (ref 5–15)
AST: 13 U/L — AB (ref 15–41)
BILIRUBIN TOTAL: 0.8 mg/dL (ref 0.3–1.2)
BUN: 13 mg/dL (ref 6–20)
CALCIUM: 9.3 mg/dL (ref 8.9–10.3)
CO2: 23 mmol/L (ref 22–32)
Chloride: 105 mmol/L (ref 101–111)
Creatinine, Ser: 0.91 mg/dL (ref 0.44–1.00)
GFR calc Af Amer: >60 mL/min (ref >60)
GFR, EST NON AFRICAN AMERICAN: 54 mL/min — AB (ref >60)
GLUCOSE: 95 mg/dL (ref 65–99)
POTASSIUM: 4.2 mmol/L (ref 3.5–5.1)
Sodium: 136 mmol/L (ref 135–145)
TOTAL PROTEIN: 6.6 g/dL (ref 6.5–8.1)

## 2016-03-06 LAB — CBC WITH DIFFERENTIAL/PLATELET
BASOS PCT: 1 %
Basophils Absolute: 0 10*3/uL (ref 0.0–0.1)
Eosinophils Absolute: 0.3 10*3/uL (ref 0.0–0.7)
Eosinophils Relative: 4 %
HEMATOCRIT: 34.4 % — AB (ref 36.0–46.0)
HEMOGLOBIN: 11.3 g/dL — AB (ref 12.0–15.0)
LYMPHS PCT: 29 %
Lymphs Abs: 1.8 10*3/uL (ref 0.7–4.0)
MCH: 28.5 pg (ref 26.0–34.0)
MCHC: 32.8 g/dL (ref 30.0–36.0)
MCV: 86.6 fL (ref 78.0–100.0)
MONO ABS: 0.5 10*3/uL (ref 0.1–1.0)
MONOS PCT: 7 %
NEUTROS ABS: 3.7 10*3/uL (ref 1.7–7.7)
NEUTROS PCT: 59 %
Platelets: 196 10*3/uL (ref 150–400)
RBC: 3.97 MIL/uL (ref 3.87–5.11)
RDW: 15 % (ref 11.5–15.5)
WBC: 6.3 10*3/uL (ref 4.0–10.5)

## 2016-03-06 LAB — URINE MICROSCOPIC-ADD ON
Bacteria, UA: NONE SEEN
RBC / HPF: NONE SEEN RBC/hpf (ref 0–5)

## 2016-03-06 LAB — URINALYSIS, ROUTINE W REFLEX MICROSCOPIC
BILIRUBIN URINE: NEGATIVE
GLUCOSE, UA: NEGATIVE mg/dL
HGB URINE DIPSTICK: NEGATIVE
KETONES UR: NEGATIVE mg/dL
Nitrite: NEGATIVE
PH: 8 (ref 5.0–8.0)
PROTEIN: NEGATIVE mg/dL
Specific Gravity, Urine: 1.009 (ref 1.005–1.030)

## 2016-03-06 LAB — MAGNESIUM: MAGNESIUM: 2.1 mg/dL (ref 1.7–2.4)

## 2016-03-06 LAB — I-STAT CG4 LACTIC ACID, ED: Lactic Acid, Venous: 0.7 mmol/L (ref 0.5–1.9)

## 2016-03-06 MED ORDER — SODIUM CHLORIDE 0.9 % IV BOLUS (SEPSIS)
1000.0000 mL | Freq: Once | INTRAVENOUS | Status: AC
  Administered 2016-03-06: 1000 mL via INTRAVENOUS

## 2016-03-06 MED ORDER — ONDANSETRON HCL 4 MG/2ML IJ SOLN
4.0000 mg | Freq: Once | INTRAMUSCULAR | Status: AC
  Administered 2016-03-06: 4 mg via INTRAVENOUS
  Filled 2016-03-06: qty 2

## 2016-03-06 MED ORDER — HEPARIN SOD (PORK) LOCK FLUSH 100 UNIT/ML IV SOLN
500.0000 [IU] | Freq: Once | INTRAVENOUS | Status: AC
  Administered 2016-03-06: 500 [IU]
  Filled 2016-03-06: qty 5

## 2016-03-06 NOTE — ED Triage Notes (Signed)
She states she feels "too weak and dizzy to walk" since yesterday. She states this has happened before, and when it does "they give me IV infusions."

## 2016-03-06 NOTE — Discharge Instructions (Signed)
Please keep well nourished and drink plenty of fluids.   Follow-up closely with your primary care physician or oncologist for re-check in next 1-2 days  Return without fail for worsening symptoms, including fever, confusion, inability to walk, or any other symptoms concerning to you.

## 2016-03-06 NOTE — ED Provider Notes (Signed)
Patient signed out to me by Dr. Trellis Moment and urinalysis is negative. No infection noted. Feels better after IV hydration will be discharged home   Joann Leigh, MD 03/06/16 [phone]

## 2016-03-06 NOTE — ED Notes (Signed)
Pt was given a bag of ice for her port to help decrease sensation prior to access of port. Pt is now going to imaging. Pt states she would like to look for her numbing cream prior to accessing port

## 2016-03-06 NOTE — ED Provider Notes (Signed)
[facility] DEPT Provider Note   CSN: [id number] Arrival date & time: 03/06/16  1545     History   Chief Complaint Chief Complaint  Patient presents with  . Weakness    HPI Joann Herrera is a 82 y.o. female.  HPI 82 year old female who presents with generalized weakness and lightheadedness. She has a history of metastatic sigmoid colon cancer. She is followed by [name] from oncology. She is receiving active treatment with Keytruda, last treatment 2 weeks ago. Has refused hospice per [name].   States progressive worsening generalized weakness and decreased appetite, worsened today where she was unable to get out of bed and ambulate. Lives at home by herself. Denies fever, chills, Nausea or vomiting, dysuria or urinary frequency. States baseline abdominal pain in the right upper quadrant. Occasionally has diarrhea, which is unchanged from her baseline. No cough, difficulty breathing, or chest pain.   Past Medical History:  Diagnosis Date  . Cancer (HCC)    squamous cell  . Colon carcinoma ([place])    dx. left Colon cancer,evidence of metastasis  . Complication of anesthesia    "morphine caused severe shakes"  . GERD (gastroesophageal reflux disease)   . Macular degeneration   . Pneumonia 04/2012  . PONV (postoperative nausea and vomiting)   . Rheumatoid Arthritis     Patient Active Problem List   Diagnosis Date Noted  . Dehydration 09/08/2015  . Constipation 09/08/2015  . Atypical chest pain   . Metastatic colon cancer in female [facility])   . Cancer of left colon ([place]) 05/18/2015  . Abdominal aortic aneurysm ([place]) 04/21/2015  . Cholelithiasis 03/17/2015  . Dilated cbd, acquired 03/17/2015  . Gastritis 02/25/2015  . Aortic arch atherosclerosis ([place]) 02/25/2015  . Numbness 02/25/2015  . Nicotine dependence 02/25/2015  . Abdominal pain, epigastric 01/06/2015  . Hypertriglyceridemia 12/11/2014  . Vertigo 11/16/2014  . CVA (cerebral infarction) 11/16/2014  .  Arthritis 11/16/2014  . Pre-syncope 11/16/2014  . Hypocalcemia 11/16/2014  . Squamous cell carcinoma 07/30/2014  . Insomnia 03/17/2014  . Fatigue 09/13/2013  . Depression 09/13/2013  . GERD (gastroesophageal reflux disease) 09/02/2013  . Unsteady gait 09/02/2013  . Dry eye 06/27/2013  . 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 Horseshoe tear of retina without detachment 03/31/2011  . Age-related macular degeneration, dry 03/31/2011    Past Surgical History:  Procedure Laterality Date  . CATARACT EXTRACTION, BILATERAL    . Cataracts    . COLONOSCOPY WITH PROPOFOL N/A 04/30/2015   Procedure: COLONOSCOPY WITH PROPOFOL;  Surgeon: Milus Banister, MD;  Location: [facility] ENDOSCOPY;  Service: Endoscopy;  Laterality: N/A;  . ESOPHAGOGASTRODUODENOSCOPY N/A 01/22/2015   Procedure: ESOPHAGOGASTRODUODENOSCOPY (EGD);  Surgeon: Milus Banister, MD;  Location: [facility];  Service: Endoscopy;  Laterality: N/A;  . EYE SURGERY     bilateral cataracts  . HAND SURGERY     Pt states she had both hands operated on for arthritis.  Marland Kitchen HAND SURGERY    . HEMORRHOID SURGERY    . IR GENERIC HISTORICAL  02/12/2016   IR US GUIDE VASC ACCESS RIGHT 02/12/2016 Corrie Mckusick, DO [facility]-INTER[facility]  . IR GENERIC HISTORICAL  02/12/2016   IR FLUORO GUIDE PORT INSERTION RIGHT 02/12/2016 Corrie Mckusick, DO [facility]  . JOINT REPLACEMENT    . MINOR HEMORRHOIDECTOMY    . SHOULDER ARTHROSCOPY Left   . SHOULDER SURGERY Left    arthroscopy  . TONSILLECTOMY    . TOTAL KNEE ARTHROPLASTY     x 2  .  TOTAL KNEE ARTHROPLASTY Right   . TOTAL KNEE ARTHROPLASTY Left     OB History    Gravida Para Term Preterm AB Living   0 0 0 0 0     SAB TAB Ectopic Multiple Live Births   0 0 0           Home Medications    Prior to Admission medications   Medication Sig Start Date End Date Taking? Authorizing Provider  acetaminophen (TYLENOL) 500 MG tablet Take 1,000 mg by mouth every 6 (six) hours as needed. For pain   Yes  Historical Provider, MD  ondansetron (ZOFRAN-ODT) 8 MG disintegrating tablet Take 8 mg by mouth 3 (three) times daily as needed. For nausea. 08/14/15  Yes Historical Provider, MD  traMADol (ULTRAM) 50 MG tablet Take 1 tablet (50 mg total) by mouth every 8 (eight) hours as needed. 02/26/16  Yes Truitt Merle, MD  lidocaine-prilocaine (EMLA) cream Apply 1 application topically as needed. 02/12/16   Truitt Merle, MD  Polyethyl Glycol-Propyl Glycol (SYSTANE OP) Apply 1-2 drops to eye 4 (four) times daily as needed (for dry eye).    Historical Provider, MD  prochlorperazine (COMPAZINE) 10 MG tablet Take 1 tablet (10 mg total) by mouth every 6 (six) hours as needed. Patient not taking: Reported on 03/06/2016 07/31/15   Truitt Merle, MD    Family History Family History  Problem Relation Age of Onset  . Cancer Sister 31    breast  . Diabetes Sister   . Arthritis Mother   . Arthritis-Osteo Mother   . Diabetes Brother   . Suicidality Brother   . Diabetes Brother   . Breast cancer Sister   . Cancer Maternal Aunt     breast cancer     Social History Social History  Substance Use Topics  . Smoking status: Current Every Day Smoker    Packs/day: 1.00  . Smokeless tobacco: Never Used  . Alcohol use No     Allergies   Aspirin; Codeine; Morphine and related; and Penicillins   Review of Systems Review of Systems 10/14 systems reviewed and are negative other than those stated in the HPI   Physical Exam Updated Vital Signs BP 138/66 (BP Location: Left Arm)   Pulse 60   Temp 97.6 F (36.4 C) (Oral)   Resp 18   Ht 5\' 3"  (1.6 m)   Wt 129 lb (58.5 kg)   SpO2 100%   BMI 22.85 kg/m   Physical Exam Physical Exam  Nursing note and vitals reviewed. Constitutional: Chronically ill appearing, appears fatigued and listless, in no acute distress Head: Normocephalic and atraumatic.  Mouth/Throat: Oropharynx is clear and dry.  Neck: Normal range of motion. Neck supple.  Cardiovascular: Normal rate and  regular rhythm.   Pulmonary/Chest: Effort normal and breath sounds normal.  Abdominal: Soft. There is mild RUQ tenderness. There is no rebound and no guarding.  Musculoskeletal: Normal range of motion.  Neurological: Alert, no facial droop, fluent speech, moves all extremities symmetrically Skin: Skin is warm and dry.  Psychiatric: Cooperative   ED Treatments / Results  Labs (all labs ordered are listed, but only abnormal results are displayed) Labs Reviewed  CBC WITH DIFFERENTIAL/PLATELET - Abnormal; Notable for the following:       Result Value   Hemoglobin 11.3 (*)    HCT 34.4 (*)    All other components within normal limits  COMPREHENSIVE METABOLIC PANEL - Abnormal; Notable for the following:    AST 13 (*)  ALT 8 (*)    GFR calc non Af Amer 54 (*)    All other components within normal limits  URINALYSIS, ROUTINE W REFLEX MICROSCOPIC (NOT AT [facility]) - Abnormal; Notable for the following:    Leukocytes, UA SMALL (*)    All other components within normal limits  URINE MICROSCOPIC-ADD ON - Abnormal; Notable for the following:    Squamous Epithelial / LPF 0-5 (*)    All other components within normal limits  MAGNESIUM  I-STAT CG4 LACTIC ACID, ED  I-STAT CG4 LACTIC ACID, ED    EKG  EKG Interpretation None       Radiology Dg Chest 2 View  Result Date: 03/06/2016 CLINICAL DATA:  Generalized body pain and short of breath EXAM: CHEST  2 VIEW COMPARISON:  11/09/2015 FINDINGS: Patient rotated leftward. Power port appears to be in the distal SVC. No pneumothorax. Hyperinflated lungs. IMPRESSION: Hyperinflated lungs.  No pneumonia or pneumothorax. Electronically Signed   By: Suzy Bouchard M.D.   On: 03/06/2016 17:09    Procedures Procedures (including critical care time)  Medications Ordered in ED Medications  sodium chloride 0.9 % bolus 1,000 mL (0 mLs Intravenous Stopped 03/06/16 2017)  ondansetron (ZOFRAN) injection 4 mg (4 mg Intravenous Given 03/06/16 1734)    heparin lock flush 100 unit/mL (500 Units Intracatheter Given 03/06/16 2012)     Initial Impression / Assessment and Plan / ED Course  I have reviewed the triage vital signs and the nursing notes.  Pertinent labs & imaging results that were available during my care of the patient were reviewed by me and considered in my medical decision making (see chart for details).  Clinical Course     82 year old female with history of metastatic colorectal cancer who presents with generalized weakness and fatigue. Afebrile with normal vital signs. Appears tired and dry on exam, but otherwise exam is non-focal.   Blood work w/ no evidence of electrolyte or metabolic derangements. Baseline anemia. Normal renal function. No leukocytosis. CXR w/o infiltrate or PNA on visualization. Pending UA.   Given 1 L IVF, and on recheck feels significantly improved. Is more alert and at baseline per patient's son. Is eating at bedside. She requests discharge home. Discussed with son, who states that he is staying at home w/ her and will care for her. Will ambulate patient and if normal UA will be discharged home.   Final Clinical Impressions(s) / ED Diagnoses   Final diagnoses:  Weakness  Dehydration    New Prescriptions Discharge Medication List as of 03/06/2016  7:54 PM       Forde Dandy, MD 03/06/16 2135

## 2016-03-16 ENCOUNTER — Other Ambulatory Visit: Payer: Self-pay | Admitting: Hematology

## 2016-03-18 ENCOUNTER — Encounter: Payer: Self-pay | Admitting: Nurse Practitioner

## 2016-03-18 ENCOUNTER — Telehealth: Payer: Self-pay | Admitting: Hematology

## 2016-03-18 ENCOUNTER — Ambulatory Visit (HOSPITAL_BASED_OUTPATIENT_CLINIC_OR_DEPARTMENT_OTHER): Payer: Medicare Other

## 2016-03-18 ENCOUNTER — Telehealth: Payer: Self-pay | Admitting: *Deleted

## 2016-03-18 ENCOUNTER — Ambulatory Visit (HOSPITAL_BASED_OUTPATIENT_CLINIC_OR_DEPARTMENT_OTHER): Payer: Medicare Other | Admitting: Nurse Practitioner

## 2016-03-18 ENCOUNTER — Other Ambulatory Visit (HOSPITAL_BASED_OUTPATIENT_CLINIC_OR_DEPARTMENT_OTHER): Payer: Medicare Other

## 2016-03-18 VITALS — BP 115/48 | HR 63 | Temp 97.6°F | Resp 18 | Ht 63.0 in | Wt 130.3 lb

## 2016-03-18 DIAGNOSIS — C186 Malignant neoplasm of descending colon: Secondary | ICD-10-CM

## 2016-03-18 DIAGNOSIS — R5382 Chronic fatigue, unspecified: Secondary | ICD-10-CM | POA: Diagnosis not present

## 2016-03-18 DIAGNOSIS — C187 Malignant neoplasm of sigmoid colon: Secondary | ICD-10-CM

## 2016-03-18 DIAGNOSIS — C772 Secondary and unspecified malignant neoplasm of intra-abdominal lymph nodes: Secondary | ICD-10-CM | POA: Diagnosis not present

## 2016-03-18 DIAGNOSIS — R110 Nausea: Secondary | ICD-10-CM | POA: Diagnosis not present

## 2016-03-18 DIAGNOSIS — G4700 Insomnia, unspecified: Secondary | ICD-10-CM | POA: Diagnosis not present

## 2016-03-18 DIAGNOSIS — R1013 Epigastric pain: Secondary | ICD-10-CM | POA: Diagnosis not present

## 2016-03-18 DIAGNOSIS — Z5112 Encounter for antineoplastic immunotherapy: Secondary | ICD-10-CM | POA: Diagnosis not present

## 2016-03-18 DIAGNOSIS — E46 Unspecified protein-calorie malnutrition: Secondary | ICD-10-CM | POA: Diagnosis not present

## 2016-03-18 DIAGNOSIS — R530 Neoplastic (malignant) related fatigue: Secondary | ICD-10-CM | POA: Diagnosis not present

## 2016-03-18 DIAGNOSIS — C799 Secondary malignant neoplasm of unspecified site: Secondary | ICD-10-CM

## 2016-03-18 DIAGNOSIS — Z452 Encounter for adjustment and management of vascular access device: Secondary | ICD-10-CM | POA: Diagnosis not present

## 2016-03-18 LAB — COMPREHENSIVE METABOLIC PANEL
ALBUMIN: 3.3 g/dL — AB (ref 3.5–5.0)
ALK PHOS: 80 U/L (ref 40–150)
ALT: 7 U/L (ref 0–55)
ANION GAP: 9 meq/L (ref 3–11)
AST: 11 U/L (ref 5–34)
BILIRUBIN TOTAL: 0.31 mg/dL (ref 0.20–1.20)
BUN: 13.5 mg/dL (ref 7.0–26.0)
CALCIUM: 9.4 mg/dL (ref 8.4–10.4)
CO2: 24 mEq/L (ref 22–29)
CREATININE: 0.9 mg/dL (ref 0.6–1.1)
Chloride: 107 mEq/L (ref 98–109)
EGFR: 60 mL/min/{1.73_m2} — ABNORMAL LOW (ref >90)
Glucose: 90 mg/dl (ref 70–140)
Potassium: 5.2 mEq/L — ABNORMAL HIGH (ref 3.5–5.1)
Sodium: 140 mEq/L (ref 136–145)
Total Protein: 7.1 g/dL (ref 6.4–8.3)

## 2016-03-18 LAB — CBC WITH DIFFERENTIAL/PLATELET
BASO%: 1.3 % (ref 0.0–2.0)
Basophils Absolute: 0.1 10*3/uL (ref 0.0–0.1)
EOS%: 5.6 % (ref 0.0–7.0)
Eosinophils Absolute: 0.4 10*3/uL (ref 0.0–0.5)
HCT: 36.7 % (ref 34.8–46.6)
HEMOGLOBIN: 12 g/dL (ref 11.6–15.9)
LYMPH%: 26.2 % (ref 14.0–49.7)
MCH: 28.3 pg (ref 25.1–34.0)
MCHC: 32.7 g/dL (ref 31.5–36.0)
MCV: 86.7 fL (ref 79.5–101.0)
MONO#: 0.5 10*3/uL (ref 0.1–0.9)
MONO%: 7 % (ref 0.0–14.0)
NEUT%: 59.9 % (ref 38.4–76.8)
NEUTROS ABS: 3.9 10*3/uL (ref 1.5–6.5)
Platelets: 371 10*3/uL (ref 145–400)
RBC: 4.23 10*6/uL (ref 3.70–5.45)
RDW: 15 % — AB (ref 11.2–14.5)
WBC: 6.5 10*3/uL (ref 3.9–10.3)
lymph#: 1.7 10*3/uL (ref 0.9–3.3)

## 2016-03-18 LAB — TSH: TSH: 1.277 m(IU)/L (ref 0.308–3.960)

## 2016-03-18 MED ORDER — SODIUM CHLORIDE 0.9 % IV SOLN
INTRAVENOUS | Status: AC
  Administered 2016-03-18: 15:00:00 via INTRAVENOUS

## 2016-03-18 MED ORDER — SODIUM CHLORIDE 0.9 % IV SOLN
2.0000 mg/kg | Freq: Once | INTRAVENOUS | Status: AC
  Administered 2016-03-18: 125 mg via INTRAVENOUS
  Filled 2016-03-18: qty 5

## 2016-03-18 MED ORDER — ALTEPLASE 2 MG IJ SOLR
2.0000 mg | Freq: Once | INTRAMUSCULAR | Status: AC
  Administered 2016-03-18: 2 mg
  Filled 2016-03-18: qty 2

## 2016-03-18 MED ORDER — SODIUM CHLORIDE 0.9% FLUSH
10.0000 mL | INTRAVENOUS | Status: DC | PRN
  Administered 2016-03-18: 10 mL
  Filled 2016-03-18: qty 10

## 2016-03-18 MED ORDER — HEPARIN SOD (PORK) LOCK FLUSH 100 UNIT/ML IV SOLN
500.0000 [IU] | Freq: Once | INTRAVENOUS | Status: AC | PRN
  Administered 2016-03-18: 500 [IU]
  Filled 2016-03-18: qty 5

## 2016-03-18 MED ORDER — SODIUM CHLORIDE 0.9 % IJ SOLN
10.0000 mL | INTRAMUSCULAR | Status: DC | PRN
  Administered 2016-03-18: 10 mL
  Filled 2016-03-18: qty 10

## 2016-03-18 MED ORDER — METHYLPREDNISOLONE SODIUM SUCC 40 MG IJ SOLR
40.0000 mg | Freq: Once | INTRAMUSCULAR | Status: AC
  Administered 2016-03-18: 40 mg via INTRAVENOUS

## 2016-03-18 MED ORDER — METHYLPREDNISOLONE SODIUM SUCC 40 MG IJ SOLR
INTRAMUSCULAR | Status: AC
  Filled 2016-03-18: qty 1

## 2016-03-18 NOTE — Telephone Encounter (Signed)
Message sent to chemo scheduler to be added. Appointments scheduled per 03/18/16 los. AVS report and appointment schedule was given to patient, per 03/18/16 los. °

## 2016-03-18 NOTE — Telephone Encounter (Signed)
Per LOS I have scheduled appts and notified the scheduler 

## 2016-03-18 NOTE — Progress Notes (Signed)
Potassium 5.2, Diane RN aware whom will notify Lattie Haw NP. No further orders at this time.

## 2016-03-18 NOTE — Progress Notes (Signed)
  [facility] OFFICE PROGRESS NOTE   Diagnosis:  Metastatic colon cancer  CURRENT THERAPY: Keytruda '200mg'$  iv every 3 weeks started on 07/08/2015, dose reduced to '2mg'$ /kg from cycle 2 due to severe reaction after first infusion  INTERVAL HISTORY:  Ms. Kauth returns as scheduled. She completed another cycle of Pembrolizumab 02/26/2016. She continues to feel weak. No nausea or vomiting. Mouth feels sore. No mouth ulcers. No diarrhea. No rash. She has stable abdominal pain. Tramadol relieves the pain. She describes her appetite as "pretty good". She has mild dyspnea on exertion. Occasional cough. No fever.  Objective:  Vital signs in last 24 hours:  Blood pressure (!) 115/48, pulse 63, temperature 97.6 F (36.4 C), temperature source Oral, resp. rate 18, height '5\' 3"'$  (1.6 m), weight 130 lb 4.8 oz (59.1 kg), SpO2 100 %.    HEENT: No thrush or ulcers. Resp: Lungs clear bilaterally. Cardio: Regular rate and rhythm. GI: Abdomen soft. Mild tenderness right abdomen. No hepatomegaly. No mass. Vascular: No leg edema.  Skin: No rash. Port-A-Cath without erythema.    Lab Results:  Lab Results  Component Value Date   WBC 6.5 03/18/2016   HGB 12.0 03/18/2016   HCT 36.7 03/18/2016   MCV 86.7 03/18/2016   PLT 371 03/18/2016   NEUTROABS 3.9 03/18/2016    Imaging:  No results found.  Medications: I have reviewed the patient's current medications.  Assessment/Plan: 1. Sigmoid colon cancer, cTxNxM1, clinical stage IV with periportal node metastases, poorly differentiated adenocarcinoma, MSI-high; on active treatment with Pembrolizumab. 2. Right upper and left low abdominal pain secondary to #1. 3. Nausea. 4. Fatigue and malnutrition. 5. Insomnia. She could not tolerate Remeron.   Disposition:Joann Herrera appears stable. Plan to proceed with Pembrolizumab today as scheduled. She will return for a follow-up visit in 3 weeks.    [place], [place] ANP/GNP-BC   03/18/2016    2:13 PM

## 2016-03-18 NOTE — Patient Instructions (Signed)
Steps to Quit Smoking Smoking tobacco can be bad for your health. It can also affect almost every organ in your body. Smoking puts you and people around you at risk for many serious long-lasting (chronic) diseases. Quitting smoking is hard, but it is one of the best things that you can do for your health. It is never too late to quit. What are the benefits of quitting smoking? When you quit smoking, you lower your risk for getting serious diseases and conditions. They can include:  Lung cancer or lung disease.  Heart disease.  Stroke.  Heart attack.  Not being able to have children (infertility).  Weak bones (osteoporosis) and broken bones (fractures). If you have coughing, wheezing, and shortness of breath, those symptoms may get better when you quit. You may also get sick less often. If you are pregnant, quitting smoking can help to lower your chances of having a baby of low birth weight. What can I do to help me quit smoking? Talk with your doctor about what can help you quit smoking. Some things you can do (strategies) include:  Quitting smoking totally, instead of slowly cutting back how much you smoke over a period of time.  Going to in-person counseling. You are more likely to quit if you go to many counseling sessions.  Using resources and support systems, such as:  Online chats with a counselor.  Phone quitlines.  Printed self-help materials.  Support groups or group counseling.  Text messaging programs.  Mobile phone apps or applications.  Taking medicines. Some of these medicines may have nicotine in them. If you are pregnant or breastfeeding, do not take any medicines to quit smoking unless your doctor says it is okay. Talk with your doctor about counseling or other things that can help you. Talk with your doctor about using more than one strategy at the same time, such as taking medicines while you are also going to in-person counseling. This can help make quitting  easier. What things can I do to make it easier to quit? Quitting smoking might feel very hard at first, but there is a lot that you can do to make it easier. Take these steps:  Talk to your family and friends. Ask them to support and encourage you.  Call phone quitlines, reach out to support groups, or work with a counselor.  Ask people who smoke to not smoke around you.  Avoid places that make you want (trigger) to smoke, such as:  Bars.  Parties.  Smoke-break areas at work.  Spend time with people who do not smoke.  Lower the stress in your life. Stress can make you want to smoke. Try these things to help your stress:  Getting regular exercise.  Deep-breathing exercises.  Yoga.  Meditating.  Doing a body scan. To do this, close your eyes, focus on one area of your body at a time from head to toe, and notice which parts of your body are tense. Try to relax the muscles in those areas.  Download or buy apps on your mobile phone or tablet that can help you stick to your quit plan. There are many free apps, such as QuitGuide from the CDC (Centers for Disease Control and Prevention). You can find more support from smokefree.gov and other websites. This information is not intended to replace advice given to you by your health care provider. Make sure you discuss any questions you have with your health care provider. Document Released: 02/05/2009 Document Revised: 12/08/2015 Document   Reviewed: 08/26/2014 Elsevier Interactive Patient Education  2017 Elsevier Inc.  

## 2016-03-18 NOTE — Patient Instructions (Signed)
[facility] Discharge Instructions for Patients Receiving Chemotherapy  Today you received the following chemotherapy agents: Keytruda   To help prevent nausea and vomiting after your treatment, we encourage you to take your nausea medication as directed    If you develop nausea and vomiting that is not controlled by your nausea medication, call the clinic.   BELOW ARE SYMPTOMS THAT SHOULD BE REPORTED IMMEDIATELY:  *FEVER GREATER THAN 100.5 F  *CHILLS WITH OR WITHOUT FEVER  NAUSEA AND VOMITING THAT IS NOT CONTROLLED WITH YOUR NAUSEA MEDICATION  *UNUSUAL SHORTNESS OF BREATH  *UNUSUAL BRUISING OR BLEEDING  TENDERNESS IN MOUTH AND THROAT WITH OR WITHOUT PRESENCE OF ULCERS  *URINARY PROBLEMS  *BOWEL PROBLEMS  UNUSUAL RASH Items with * indicate a potential emergency and should be followed up as soon as possible.  Feel free to call the clinic you have any questions or concerns. The clinic phone number is [phone].  Please show the CHEMO ALERT CARD at check-in to the Emergency Department and triage nurse.   

## 2016-03-24 ENCOUNTER — Other Ambulatory Visit: Payer: Self-pay | Admitting: Hematology

## 2016-03-24 ENCOUNTER — Telehealth: Payer: Self-pay | Admitting: *Deleted

## 2016-03-24 MED ORDER — CIPROFLOXACIN HCL 500 MG PO TABS: 500.0000 mg | ORAL_TABLET | Freq: Two times a day (BID) | ORAL | 0 refills | Status: DC

## 2016-03-24 MED FILL — CIPROFLOXACIN HCL 500 MG TA: 500 | 5 days supply | Qty: 10 | Fill #0

## 2016-03-24 NOTE — Telephone Encounter (Signed)
"  This is Joann Herrera [phone]) calling to see if [name] can order something for my mom.  She has a urinary tract infection.  Started yesterday with increased urine output and burning.  Burned all night, no fever."  Drinking water fairly well."   Advised to prepare to come in.  Will notify provider.

## 2016-03-24 NOTE — Telephone Encounter (Signed)
I spoke with patient and her daughter, confirmed her symptoms. I offered her appointment to see our symptom management clinic, but she just wants me to call in antibotics. I called in Cipro 500 mg twice daily for 5 days to [employer], her daughter will pick up a day today. I recommend her to drink water adequately, at least 5060 ounces of a day, and call us back tomorrow if her symptoms doesn't improve, or if she spikes fever. She voiced good understanding.  Truitt Merle  03/24/2016 9:00 AM

## 2016-04-08 ENCOUNTER — Ambulatory Visit: Payer: Medicare Other

## 2016-04-08 ENCOUNTER — Ambulatory Visit (HOSPITAL_BASED_OUTPATIENT_CLINIC_OR_DEPARTMENT_OTHER): Payer: Medicare Other

## 2016-04-08 ENCOUNTER — Ambulatory Visit (HOSPITAL_BASED_OUTPATIENT_CLINIC_OR_DEPARTMENT_OTHER): Payer: Medicare Other | Admitting: Nurse Practitioner

## 2016-04-08 ENCOUNTER — Other Ambulatory Visit (HOSPITAL_BASED_OUTPATIENT_CLINIC_OR_DEPARTMENT_OTHER): Payer: Medicare Other

## 2016-04-08 VITALS — BP 135/61

## 2016-04-08 DIAGNOSIS — C772 Secondary and unspecified malignant neoplasm of intra-abdominal lymph nodes: Secondary | ICD-10-CM

## 2016-04-08 DIAGNOSIS — Z5112 Encounter for antineoplastic immunotherapy: Secondary | ICD-10-CM | POA: Diagnosis not present

## 2016-04-08 DIAGNOSIS — C186 Malignant neoplasm of descending colon: Secondary | ICD-10-CM

## 2016-04-08 DIAGNOSIS — E46 Unspecified protein-calorie malnutrition: Secondary | ICD-10-CM

## 2016-04-08 DIAGNOSIS — R530 Neoplastic (malignant) related fatigue: Secondary | ICD-10-CM

## 2016-04-08 DIAGNOSIS — G4700 Insomnia, unspecified: Secondary | ICD-10-CM | POA: Diagnosis not present

## 2016-04-08 DIAGNOSIS — R110 Nausea: Secondary | ICD-10-CM

## 2016-04-08 DIAGNOSIS — R109 Unspecified abdominal pain: Secondary | ICD-10-CM | POA: Diagnosis not present

## 2016-04-08 DIAGNOSIS — C187 Malignant neoplasm of sigmoid colon: Secondary | ICD-10-CM

## 2016-04-08 LAB — COMPREHENSIVE METABOLIC PANEL
ALBUMIN: 3 g/dL — AB (ref 3.5–5.0)
ALK PHOS: 101 U/L (ref 40–150)
ALT: 12 U/L (ref 0–55)
AST: 11 U/L (ref 5–34)
Anion Gap: 10 mEq/L (ref 3–11)
BILIRUBIN TOTAL: 0.27 mg/dL (ref 0.20–1.20)
BUN: 17.8 mg/dL (ref 7.0–26.0)
CALCIUM: 8.8 mg/dL (ref 8.4–10.4)
CO2: 21 mEq/L — ABNORMAL LOW (ref 22–29)
Chloride: 109 mEq/L (ref 98–109)
Creatinine: 0.8 mg/dL (ref 0.6–1.1)
EGFR: 62 mL/min/{1.73_m2} — AB (ref >90)
Glucose: 117 mg/dl (ref 70–140)
POTASSIUM: 3.8 meq/L (ref 3.5–5.1)
Sodium: 140 mEq/L (ref 136–145)
TOTAL PROTEIN: 6.6 g/dL (ref 6.4–8.3)

## 2016-04-08 LAB — CBC WITH DIFFERENTIAL/PLATELET
BASO%: 1.1 % (ref 0.0–2.0)
Basophils Absolute: 0.1 10*3/uL (ref 0.0–0.1)
EOS%: 4.6 % (ref 0.0–7.0)
Eosinophils Absolute: 0.3 10*3/uL (ref 0.0–0.5)
HCT: 35.4 % (ref 34.8–46.6)
HGB: 11.4 g/dL — ABNORMAL LOW (ref 11.6–15.9)
LYMPH%: 21.2 % (ref 14.0–49.7)
MCH: 28.1 pg (ref 25.1–34.0)
MCHC: 32.2 g/dL (ref 31.5–36.0)
MCV: 87.4 fL (ref 79.5–101.0)
MONO#: 0.4 10*3/uL (ref 0.1–0.9)
MONO%: 7.5 % (ref 0.0–14.0)
NEUT%: 65.6 % (ref 38.4–76.8)
NEUTROS ABS: 3.9 10*3/uL (ref 1.5–6.5)
PLATELETS: 239 10*3/uL (ref 145–400)
RBC: 4.05 10*6/uL (ref 3.70–5.45)
RDW: 14.7 % — ABNORMAL HIGH (ref 11.2–14.5)
WBC: 5.9 10*3/uL (ref 3.9–10.3)
lymph#: 1.2 10*3/uL (ref 0.9–3.3)

## 2016-04-08 MED ORDER — SODIUM CHLORIDE 0.9 % IJ SOLN
10.0000 mL | INTRAMUSCULAR | Status: DC | PRN
  Administered 2016-04-08: 10 mL
  Filled 2016-04-08: qty 10

## 2016-04-08 MED ORDER — HEPARIN SOD (PORK) LOCK FLUSH 100 UNIT/ML IV SOLN
500.0000 [IU] | Freq: Once | INTRAVENOUS | Status: AC | PRN
  Administered 2016-04-08: 500 [IU]
  Filled 2016-04-08: qty 5

## 2016-04-08 MED ORDER — SODIUM CHLORIDE 0.9 % IV SOLN
Freq: Once | INTRAVENOUS | Status: AC
  Administered 2016-04-08: 16:00:00 via INTRAVENOUS

## 2016-04-08 MED ORDER — SODIUM CHLORIDE 0.9% FLUSH
10.0000 mL | INTRAVENOUS | Status: DC | PRN
  Administered 2016-04-08: 10 mL
  Filled 2016-04-08: qty 10

## 2016-04-08 MED ORDER — TRAMADOL HCL 50 MG PO TABS: 50.0000 mg | ORAL_TABLET | Freq: Three times a day (TID) | ORAL | 0 refills | Status: DC | PRN

## 2016-04-08 MED ORDER — METHYLPREDNISOLONE SODIUM SUCC 40 MG IJ SOLR
40.0000 mg | Freq: Once | INTRAMUSCULAR | Status: AC
  Administered 2016-04-08: 40 mg via INTRAVENOUS

## 2016-04-08 MED ORDER — SODIUM CHLORIDE 0.9 % IV SOLN
INTRAVENOUS | Status: AC
  Administered 2016-04-08: 15:00:00 via INTRAVENOUS

## 2016-04-08 MED ORDER — METHYLPREDNISOLONE SODIUM SUCC 40 MG IJ SOLR
INTRAMUSCULAR | Status: AC
Start: 2016-04-08 — End: 2016-04-08
  Filled 2016-04-08: qty 1

## 2016-04-08 MED ORDER — SODIUM CHLORIDE 0.9 % IV SOLN
2.0000 mg/kg | Freq: Once | INTRAVENOUS | Status: AC
  Administered 2016-04-08: 125 mg via INTRAVENOUS
  Filled 2016-04-08: qty 5

## 2016-04-08 MED ORDER — LIDOCAINE-PRILOCAINE 2.5-2.5 % EX CREA
1.0000 | TOPICAL_CREAM | CUTANEOUS | 0 refills | Status: AC | PRN
Start: 2016-04-08 — End: ?

## 2016-04-08 MED FILL — LIDOCAINE-PRILOCAINE CREAM: 2.5-2.5 | 20 days supply | Qty: 30 | Fill #0

## 2016-04-08 MED FILL — traMADol HCL 50 MG TABS: 50 | 30 days supply | Qty: 90 | Fill #0

## 2016-04-08 NOTE — Progress Notes (Signed)
  [facility] OFFICE PROGRESS NOTE   Diagnosis:  Metastatic colon cancer  CURRENT THERAPY: Keytruda '200mg'$  iv every 3 weeks started on 07/08/2015, dose reduced to '2mg'$ /kg from cycle 2 due to severe reaction after first infusion  INTERVAL HISTORY:   Joann Herrera returns as scheduled. She completed another cycle of Pembrolizumab 03/18/2016. She denies nausea/vomiting. No mouth sores. No diarrhea. No rash.  She reports not feeling well upon waking up this morning. She didn't sleep well last night. During the night she developed "sinus drainage" and a runny nose. No fever. No chills. She has stable mild dyspnea on exertion. Nonproductive cough. She feels that she has a "head cold". She has had pneumonia in the past and current symptoms are not similar.  Objective:  Vital signs in last 24 hours:  Blood pressure (!) 123/55, pulse 66, temperature 97.9 F (36.6 C), temperature source Oral, resp. rate 18, height '5\' 3"'$  (1.6 m), weight 129 lb 4.8 oz (58.7 kg), SpO2 100 %.    HEENT: No thrush or ulcers. Nose is erythematous. Resp: Lungs clear bilaterally. Cardio: Regular rate and rhythm. GI: Abdomen is soft. Mild tenderness right abdomen. No hepatomegaly. No mass. Vascular: No leg edema. Port-A-Cath without erythema.   Lab Results:  Lab Results  Component Value Date   WBC 5.9 04/08/2016   HGB 11.4 (L) 04/08/2016   HCT 35.4 04/08/2016   MCV 87.4 04/08/2016   PLT 239 04/08/2016   NEUTROABS 3.9 04/08/2016    Imaging:  No results found.  Medications: I have reviewed the patient's current medications.  Assessment/Plan: 1. Sigmoid colon cancer, cTxNxM1, clinical stage IV with periportal node metastases, poorly differentiated adenocarcinoma, MSI-high; on active treatment with Pembrolizumab. 2. Right upper and left low abdominal pain secondary to #1. 3. Nausea. 4. Fatigue and malnutrition. 5. Insomnia. She could not tolerate Remeron.   Disposition: Joann Herrera appears  stable. Plan to proceed with Pembrolizumab today as scheduled.  Current symptoms are consistent with a viral upper respiratory infection. She will treat symptomatically. She and her son understand to contact the office/seek medical evaluation if current symptoms worsen or she develops any additional symptoms such as fever, chills, increased dyspnea from baseline.  She will return for a follow-up visit and the next cycle of Pembrolizumab in 3 weeks. She will contact the office in the interim as outlined above or with any other problems.  Plan reviewed with [name].    [place], [place] ANP/GNP-BC   04/08/2016  1:57 PM

## 2016-04-08 NOTE — Addendum Note (Signed)
Addended by: Truitt Merle on: 04/08/2016 03:19 PM   Modules accepted: Orders

## 2016-04-08 NOTE — Patient Instructions (Signed)
[facility] Discharge Instructions for Patients Receiving Chemotherapy  Today you received the following chemotherapy agents:  Keytruda.  To help prevent nausea and vomiting after your treatment, we encourage you to take your nausea medication as prescribed.   If you develop nausea and vomiting that is not controlled by your nausea medication, call the clinic.   BELOW ARE SYMPTOMS THAT SHOULD BE REPORTED IMMEDIATELY:  *FEVER GREATER THAN 100.5 F  *CHILLS WITH OR WITHOUT FEVER  NAUSEA AND VOMITING THAT IS NOT CONTROLLED WITH YOUR NAUSEA MEDICATION  *UNUSUAL SHORTNESS OF BREATH  *UNUSUAL BRUISING OR BLEEDING  TENDERNESS IN MOUTH AND THROAT WITH OR WITHOUT PRESENCE OF ULCERS  *URINARY PROBLEMS  *BOWEL PROBLEMS  UNUSUAL RASH Items with * indicate a potential emergency and should be followed up as soon as possible.  Feel free to call the clinic you have any questions or concerns. The clinic phone number is [phone].  Please show the CHEMO ALERT CARD at check-in to the Emergency Department and triage nurse.   

## 2016-04-21 NOTE — Progress Notes (Signed)
Joann Herrera  [phone] [phone]) [phone]  Clinic Follow up Note   Patient Care Team: [name], MD as PCP - General (Internal Medicine) Truitt Merle, MD as Consulting Physician (Hematology) Erroll Luna, MD as Consulting Physician (General Surgery) Milus Banister, MD as Attending Physician (Gastroenterology) 04/29/2016   CHIEF COMPLAINTS:  Follow up metastatic colon cancer  Oncology History   Cancer of left colon [facility])   Staging form: Colon and Rectum, AJCC 7th Edition     Clinical stage from 04/29/2014: Stage Unknown ([place], [place], M1) - Signed by Truitt Merle, MD on 08/31/2015       Cancer of left colon ([place])   04/29/2014 Procedure    colonoscopy showed an ulcerated pancreatic type lesion was raised edges, 1.6 cm across, in the sigmoid colon. 6 polyps were removed.      04/30/2015 Initial Diagnosis    Cancer of left colon ([place])      04/30/2015 Pathology Results    sigmoid colon mass biopsy showed poorly differentiated invasive adenocarcinoma.      04/30/2015 Miscellaneous    Foundation one genomic testing showed (+) BRAF, MSI-H, high mutation burden, negative for K-ras and NRAS      05/27/2015 Imaging    PET scan showed 2 foci of asymmetric metabolic activity in distal sigmoid colon, and proximal descending colon. Intensely hypermetabolic periportal lymph nodes       07/08/2015 -  Chemotherapy    Keytruda '200mg'$  every 3 weeks, pt had severe reaction on the first night after first infusion, and second cycle was postponed to 5/8, with dose reduction to '2mg'$ /kg      10/16/2015 PET scan    PET 10/16/15 IMPRESSION: 1. Persistent enlarged and intensely hypermetabolic upper abdominal lymph nodes are identified compatible with metastatic adenopathy. When compared with the previous exam the appearance is not significantly changed. 2. Mild increase in size and FDG uptake associated with the right paratracheal lymph node. This is a nonspecific finding but  warrants attention on follow-up imaging. 3. Nonspecific focus of intense increased uptake noted within the sigmoid colon. Although likely physiologic correlation with colon cancer screening techniques advise. 4. Aortic atherosclerosis and multi vessel coronary artery calcifications.      11/09/2015 Imaging    CT AP W CONTRAST 11/09/15 IMPRESSION: 1. New bile duct enlargement. Cholelithiasis without visible choledocholithiasis. 2. Colon cancer and malignant porta hepatis adenopathy. Stable findings compared to 10/16/2015 PET-CT. 3. Probable constipation.      01/29/2016 Imaging    CT CAP WO CONTRAST 01/29/16 IMPRESSION: 1. Essentially stable appearance compared to the prior exam, with continued porta hepatis and portacaval adenopathy. Rectosigmoid mass is slightly less conspicuous but I ascribed this to the lack of IV contrast. No new metastatic lesions identified on today' s noncontrast exam. 2. There are some new peripheral bandlike in nodular opacities along the margins of the lung bases. I suspect these represent atelectasis or inflammatory findings rather than malignancy, but attention on follow up is suggested. 3. Coronary, aortic arch, and branch vessel atherosclerotic vascular disease. Aortoiliac atherosclerotic vascular disease. 4. Centrilobular emphysema. 5. Cholelithiasis.       HISTORY OF PRESENTING ILLNESS(05/18/2015):  Joann Herrera 82 y.o. female is here because of recently diagnosed colon cancer. She is accompanied by her daughter and son to the clinic today.  She has had upper abdominal pain and intermittent nausea and vomiting for almost 2 years, she had multiple ED visit and hospitalization. She describes her pain is located in the  epigastric area, usually happens after meals, 5/10 , radiates to back, with associated nausea and vomiting. She has moderate fatigue, she is no energy to do much activity except self cares. She has intermittent dizziness,  especially when she stands up. Her appetite is low, she eats small meals. she had intermittent diarrhea for a few years with lossoe BM, but recently developed mild to moderate constipation, she lasot about 15 lbs in the past few years   She was referred to gastroenterologist Dr. Ardis Hughs, and underwent EGD on 01/22/2015, which was unremarkable. Abdominal ultrasound on 03/17/2015 showed gallbladder stone, and a 3.3 cm hypoechoic mass in the portal hepatitis. She subsequently underwent abdominal MRI on 04/30/2015, which showed a 2 cm short axis node in the porta hepatis and additional 1.4 cm port cable node, with central necrosis. She underwent a colonoscopy on 04/30/2015, which showed multiple polyps and a malignant appearing mass in the sigmoid colon, the biopsy confirmed adenocarcinoma. She was seen by surgeon Dr. Brantley Stage.  CURRENT THERAPY: Keytruda '200mg'$  iv every 3 weeks started on 07/08/2015, dose reduced to '2mg'$ /kg from cycle 2 on 08/31/15 due to severe reaction after first infusion  INTERIM HISTORY: Joann Herrera returns for follow-up. She presented to the ED on 04/24/16 and yesterday for abdominal pain with nausea that is now worse. Takes 2 Tylenol and 2 Tramadol for the pain that doesn't help. She does not want to do treatment today because she is tired. The patient localized pain in the upper right quadrant. CT scan noted cholelithiasis. She and her son want to know if she could undergo gallbladder surgery. She was given an IV of dilaudid in the ED that helped her with the pain. She was provided with a prescription for Oxycodone from the ED, but did not fill it because she has an allergy to codeine. Continued abdominal pain, nausea, and constipation. The patient's surgeon is Dr. Brantley Stage.  MEDICAL HISTORY:  Past Medical History:  Diagnosis Date  . Cancer (HCC)    squamous cell  . Colon carcinoma ([place])    dx. left Colon cancer,evidence of metastasis  . Complication of anesthesia    "morphine caused severe  shakes"  . GERD (gastroesophageal reflux disease)   . Macular degeneration   . Pneumonia 04/2012  . PONV (postoperative nausea and vomiting)   . Rheumatoid Arthritis     SURGICAL HISTORY: Past Surgical History:  Procedure Laterality Date  . CATARACT EXTRACTION, BILATERAL    . Cataracts    . COLONOSCOPY WITH PROPOFOL N/A 04/30/2015   Procedure: COLONOSCOPY WITH PROPOFOL;  Surgeon: Milus Banister, MD;  Location: [facility] ENDOSCOPY;  Service: Endoscopy;  Laterality: N/A;  . ESOPHAGOGASTRODUODENOSCOPY N/A 01/22/2015   Procedure: ESOPHAGOGASTRODUODENOSCOPY (EGD);  Surgeon: Milus Banister, MD;  Location: [facility];  Service: Endoscopy;  Laterality: N/A;  . EYE SURGERY     bilateral cataracts  . HAND SURGERY     Pt states she had both hands operated on for arthritis.  Marland Kitchen HAND SURGERY    . HEMORRHOID SURGERY    . IR GENERIC HISTORICAL  02/12/2016   IR US GUIDE VASC ACCESS RIGHT 02/12/2016 Corrie Mckusick, DO [facility]-INTER[facility] RAD  . IR GENERIC HISTORICAL  02/12/2016   IR FLUORO GUIDE PORT INSERTION RIGHT 02/12/2016 Corrie Mckusick, DO [facility]  . JOINT REPLACEMENT    . MINOR HEMORRHOIDECTOMY    . SHOULDER ARTHROSCOPY Left   . SHOULDER SURGERY Left    arthroscopy  . TONSILLECTOMY    . TOTAL KNEE ARTHROPLASTY  x 2  . TOTAL KNEE ARTHROPLASTY Right   . TOTAL KNEE ARTHROPLASTY Left     SOCIAL HISTORY: Social History   Social History  . Marital status: Widowed    Spouse name: N/A  . Number of children: 3  . Years of education: N/A   Occupational History  . Retired    Social History Main Topics  . Smoking status: Current Every Day Smoker    Packs/day: 1.00  . Smokeless tobacco: Never Used  . Alcohol use No  . Drug use: No  . Sexual activity: Not on file   Other Topics Concern  . Not on file   Social History Narrative   ** Merged History Encounter **       Daughter in [place]) [phone]   Son in [place]   Son in [place]   Retired- Medical sales representative business, worked  at [employer] for 28 years   Enjoys dancing and going to [place]   Completed 11th grade   Very poor vision due to macular degeneration          FAMILY HISTORY: Family History  Problem Relation Age of Onset  . Cancer Sister 44    breast  . Diabetes Sister   . Arthritis Mother   . Arthritis-Osteo Mother   . Diabetes Brother   . Suicidality Brother   . Diabetes Brother   . Breast cancer Sister   . Cancer Maternal Aunt     breast cancer     ALLERGIES:  is allergic to aspirin; codeine; morphine and related; and penicillins.  MEDICATIONS:  Current Outpatient Prescriptions  Medication Sig Dispense Refill  . acetaminophen (TYLENOL) 500 MG tablet Take 1,000 mg by mouth every 6 (six) hours as needed for mild pain or moderate pain.     Marland Kitchen lidocaine-prilocaine (EMLA) cream Apply 1 application topically as needed. 30 g 0  . ondansetron (ZOFRAN) 8 MG tablet Take 1 tablet (8 mg total) by mouth every 8 (eight) hours as needed for nausea or vomiting. 30 tablet 2  . Polyethyl Glycol-Propyl Glycol (SYSTANE OP) Apply 1-2 drops to eye 4 (four) times daily as needed (for dry eye).    . traMADol (ULTRAM) 50 MG tablet Take 1 tablet (50 mg total) by mouth every 8 (eight) hours as needed. (Patient taking differently: Take 50 mg by mouth every 8 (eight) hours as needed for moderate pain or severe pain. ) 90 tablet 0  . HYDROmorphone (DILAUDID) 2 MG tablet Take 0.5-1 tablets (1-2 mg total) by mouth every 4 (four) hours as needed for severe pain. 45 tablet 0  . ondansetron (ZOFRAN ODT) 4 MG disintegrating tablet Take 1 tablet (4 mg total) by mouth every 8 (eight) hours as needed for nausea or vomiting. (Patient not taking: Reported on 04/29/2016) 10 tablet 0  . oxyCODONE-acetaminophen (PERCOCET/ROXICET) 5-325 MG tablet Take 0.5-1 tablets by mouth every 6 (six) hours as needed for severe pain. (Patient not taking: Reported on 04/29/2016) 10 tablet 0   No current  facility-administered medications for this visit.     REVIEW OF SYSTEMS:   Constitutional: Denies fevers, chills or abnormal night sweats Eyes: Denies blurriness of vision, double vision or watery eyes Ears, nose, mouth, throat, and face: Denies mucositis or sore throat Respiratory: Denies cough, dyspnea or wheezes Cardiovascular: Denies palpitation, chest discomfort or lower extremity swelling Gastrointestinal:  Denies heartburn (+) abdominal pain, nausea, constipation Skin: Denies abnormal skin rashes Musculoskeletal: (+) back pain Lymphatics:  Denies new lymphadenopathy or easy bruising Neurological:Denies numbness, tingling or new weaknesses (+) headaches Behavioral/Psych: Mood is stable, no new changes  All other systems were reviewed with the patient and are negative.   PHYSICAL EXAMINATION: ECOG PERFORMANCE STATUS: 3  Vitals:   04/29/16 1054  BP: (!) 112/47  Pulse: (!) 59  Resp: 18  Temp: 97.9 F (36.6 C)   Filed Weights   04/29/16 1054  Weight: 126 lb 9.6 oz (57.4 kg)    GENERAL:alert, no distress and comfortable, able to get on exam table with assistance SKIN: skin color, texture, turgor are normal, no rashes or significant lesions EYES: normal, conjunctiva are pink and non-injected, sclera clear OROPHARYNX:no exudate, no erythema and lips, buccal mucosa, and tongue normal  NECK: supple, thyroid normal size, non-tender, without nodularity LYMPH:  no palpable lymphadenopathy in the cervical, axillary or inguinal LUNGS: clear to auscultation and percussion with normal breathing effort HEART: regular rate & rhythm and no murmurs and no lower extremity edema ABDOMEN:abdomen soft (+) RUQ and LLQ tenderness with palpation, low bowel sounds Musculoskeletal:no cyanosis of digits and no clubbing  PSYCH: alert & oriented x 3 with fluent speech NEURO: no focal motor/sensory deficits   LABORATORY DATA:  I have reviewed the data as listed CBC Latest Ref Rng & Units  04/24/2016 04/08/2016 03/18/2016  WBC 4.0 - 10.5 K/uL 6.3 5.9 6.5  Hemoglobin 12.0 - 15.0 g/dL 11.0(L) 11.4(L) 12.0  Hematocrit 36.0 - 46.0 % 33.6(L) 35.4 36.7  Platelets 150 - 400 K/uL 250 239 371    CMP Latest Ref Rng & Units 04/24/2016 04/08/2016 03/18/2016  Glucose 65 - 99 mg/dL 117(H) 117 90  BUN 6 - 20 mg/dL 19 17.8 13.5  Creatinine 0.44 - 1.00 mg/dL 1.00 0.8 0.9  Sodium 135 - 145 mmol/L 138 140 140  Potassium 3.5 - 5.1 mmol/L 4.1 3.8 5.2(H)  Chloride 101 - 111 mmol/L 108 - -  CO2 22 - 32 mmol/L 23 21(L) 24  Calcium 8.9 - 10.3 mg/dL 8.7(L) 8.8 9.4  Total Protein 6.5 - 8.1 g/dL 6.6 6.6 7.1  Total Bilirubin 0.3 - 1.2 mg/dL 0.5 0.27 0.31  Alkaline Phos 38 - 126 U/L 342(H) 101 80  AST 15 - 41 U/L '26 11 11  '$ ALT 14 - 54 U/L 50 12 7    PATHOLOGY REPORT  Diagnosis 04/29/2014 1. Colon, biopsy, ascending - TUBULAR ADENOMA. NO HIGH GRADE DYSPLASIA OR MALIGNANCY IDENTIFIED. 2. Colon, biopsy, 35 cm sigmoid mass r/o malignancy - POORLY DIFFERENTIATED INVASIVE ADENOCARCINOMA. 3. Colon, polyp(s), sigmoid - TUBULAR ADENOMA. NO HIGH GRADE DYSPLASIA OR MALIGNANCY IDENTIFIED.   RADIOGRAPHIC STUDIES: I have personally reviewed the radiological images as listed and agreed with the findings in the report. Ct Abdomen Pelvis Wo Contrast  Result Date: 04/25/2016 CLINICAL DATA:  Intermittent abdominal pain, history of colon cancer EXAM: CT ABDOMEN AND PELVIS WITHOUT CONTRAST TECHNIQUE: Multidetector CT imaging of the abdomen and pelvis was performed following the standard protocol without IV contrast. COMPARISON:  01/29/2016 FINDINGS: Lower chest: No pleural effusion is visualized. Mild subpleural fibrosis and bronchiectasis again evident. Slight increased consolidation within the lingula and anterior left lung base with associated mildly dilated bronchi. Right subpleural density slightly smaller. Now contains air bronchograms. Patchy slightly nodular appearing infiltrates are present within the right  greater than left posterior lung bases. Coronary artery calcifications. Heart size within normal limits. No large pericardial effusion. Hepatobiliary: Low-attenuation at the porta hepatis could relate to periportal edema. No gross focal hepatic abnormalities. Multiple stones  present within the gallbladder lumen. No wall thickening. Extrahepatic common bile duct is enlarged, measuring up to 16 mm, similar compared to prior. Pancreas: No peripancreatic inflammation. Spleen: Normal in size without focal abnormality. Adrenals/Urinary Tract: Adrenal glands are within normal limits. Kidneys demonstrate no hydronephrosis or calcification. The bladder is normal. Stomach/Bowel: The stomach is nonenlarged. There is no dilated small bowel. Mild low sigmoid colon wall thickening presumably corresponds to history of colon cancer, this is less apparent compared to the previous exam. Appendix normal. Vascular/Lymphatic: Extensive atherosclerotic vascular disease of the aorta with focal ectasia of the infrarenal aorta. Porta hepatis adenopathy is poorly defined without contrast. Reproductive: Uterus and bilateral adnexa are unremarkable. Other: No free air or free fluid. Musculoskeletal: Degenerative changes of the spine. No suspicious or acute bone lesions are visualized. IMPRESSION: 1. Slight increased consolidation within the lingula and anterior left lung base, could relate to inflammation or infection, but progression from prior exam makes malignancy difficult to exclude. 2. Slight increased nodular appearing infiltrates within the right greater than left posterior lung bases. 3. Multiple gallstones. Dilated extrahepatic common bowel duct is similar compared to the prior study. 4. Focal wall thickening involving the distal sigmoid colon presumably corresponds to the history of colon cancer. Porta hepatis adenopathy is poorly defined without contrast. Electronically Signed   By: Donavan Foil M.D.   On: 04/25/2016 02:41     ASSESSMENT & PLAN:  82 y.o. female, with past medical history of rheumatoid arthritis, macular degeneration, and squamous cell skin cancer, presented with epigastric pain, nausea and weight loss.  1. Sigmoid colon cancer, [id number], clinical stage IV with periportal node metastases, poorly differentiated adenocarcinoma, MSI-high  -I previously reviewed her colonoscopy findings and pathology results from the biopsies. -She has no significant regional lymphadenopathy, but has two large necrotic appearing periportal nodes, which are very suspicious for metastasis. -I previously reviewed her PET scan findings with patient and her family members, which showed hypermetabolic periportal lymph nodes and sigmoid colon mass, no other metastasis. No other primary tumor was seen on the PET scan, I think the PET portal lymphadenopathy are likely distant metastasis from her colon cancer. -She previously declined hospice -She is on first line Keytruda, dosed reduced from cycle 2 due to severe reaction to first cycle. She tolerated the low dose better. -I previously discussed her restaging CT scan findings from 01/29/2016, which showed a stable disease overall. No new lesions. -The patient is tired today from her abdominal pain and trips to the ED. Will hold chemo today  -Will schedule PET scan in 1 week to view the extent of disease and refer the patient to Dr. Brantley Stage to discuss possible gallbladder removal.   2. Right upper abdominal pain -she has right side abdominal pain due to her metastatic cancer, and cholelithiasis -continue tramadol as needed.  - she refuses narcotics in the past, she had bad experience with morphine and codein in the past. She received IV Dilaudid in the hospital, and responded very well. I given her a new prescription of oral Dilaudid -Encouraged her to try a stool softener, like colace, once or twice daily to avoid constipation and uses laxatives as needed -I'll refer her to see  Dr. Brantley Stage to discuss cholecystectomy.  3. Nausea -continue Zofran as need, she did not tolerate the Compazine well, had dizziness  4. Fatigue and malnutrition -I encouraged her to continue ensure 2-3 bottles a day -follow up with dietitian  -her weight is stable    5. Insomnia -  she could not tolerate mirtazapine, off now   PLAN -Hold treatment today. -Prescribed Dilaudid today, 1-2 mg every 4 hours as needed for severe pain. -I encouraged her to watch her bowel movements and take laxative as needed to avoid constipation. -I'll see her back in 2 weeks. -Schedule PET scan in 1 week and then referral to Dr. Brantley Stage regarding gallbladder surgery.  All questions were answered. The patient knows to call the clinic with any problems, questions or concerns.  I spent 20 minutes counseling the patient face to face. The total time spent in the appointment was 25 minutes and more than 50% was on counseling.    Truitt Merle, MD 04/29/2016   This document serves as a record of services personally performed by Truitt Merle, MD. It was created on her behalf by Darcus Austin, a trained medical scribe. The creation of this record is based on the scribe's personal observations and the provider's statements to them. This document has been checked and approved by the attending provider.

## 2016-04-24 ENCOUNTER — Emergency Department (HOSPITAL_COMMUNITY)
Admission: EM | Admit: 2016-04-24 | Discharge: 2016-04-25 | Disposition: A | Discharge status: Home / Self Care | Payer: Medicare Other | Attending: Emergency Medicine | Admitting: Emergency Medicine

## 2016-04-24 DIAGNOSIS — R1010 Upper abdominal pain, unspecified: Secondary | ICD-10-CM | POA: Diagnosis not present

## 2016-04-24 DIAGNOSIS — K8020 Calculus of gallbladder without cholecystitis without obstruction: Secondary | ICD-10-CM | POA: Diagnosis not present

## 2016-04-24 DIAGNOSIS — Z85038 Personal history of other malignant neoplasm of large intestine: Secondary | ICD-10-CM | POA: Insufficient documentation

## 2016-04-24 DIAGNOSIS — Z96653 Presence of artificial knee joint, bilateral: Secondary | ICD-10-CM | POA: Diagnosis not present

## 2016-04-24 DIAGNOSIS — Z79899 Other long term (current) drug therapy: Secondary | ICD-10-CM | POA: Diagnosis not present

## 2016-04-24 DIAGNOSIS — R109 Unspecified abdominal pain: Secondary | ICD-10-CM | POA: Diagnosis present

## 2016-04-24 DIAGNOSIS — F17200 Nicotine dependence, unspecified, uncomplicated: Secondary | ICD-10-CM | POA: Insufficient documentation

## 2016-04-24 DIAGNOSIS — Z8673 Personal history of transient ischemic attack (TIA), and cerebral infarction without residual deficits: Secondary | ICD-10-CM | POA: Insufficient documentation

## 2016-04-24 LAB — COMPREHENSIVE METABOLIC PANEL
ALT: 50 U/L (ref 14–54)
AST: 26 U/L (ref 15–41)
Albumin: 3.6 g/dL (ref 3.5–5.0)
Alkaline Phosphatase: 342 U/L — ABNORMAL HIGH (ref 38–126)
Anion gap: 7 (ref 5–15)
BUN: 19 mg/dL (ref 6–20)
CHLORIDE: 108 mmol/L (ref 101–111)
CO2: 23 mmol/L (ref 22–32)
CREATININE: 1 mg/dL (ref 0.44–1.00)
Calcium: 8.7 mg/dL — ABNORMAL LOW (ref 8.9–10.3)
GFR calc non Af Amer: 48 mL/min — ABNORMAL LOW (ref >60)
GFR, EST AFRICAN AMERICAN: 56 mL/min — AB (ref >60)
Glucose, Bld: 117 mg/dL — ABNORMAL HIGH (ref 65–99)
POTASSIUM: 4.1 mmol/L (ref 3.5–5.1)
SODIUM: 138 mmol/L (ref 135–145)
Total Bilirubin: 0.5 mg/dL (ref 0.3–1.2)
Total Protein: 6.6 g/dL (ref 6.5–8.1)

## 2016-04-24 LAB — CBC
HEMATOCRIT: 33.6 % — AB (ref 36.0–46.0)
HEMOGLOBIN: 11 g/dL — AB (ref 12.0–15.0)
MCH: 28.1 pg (ref 26.0–34.0)
MCHC: 32.7 g/dL (ref 30.0–36.0)
MCV: 85.9 fL (ref 78.0–100.0)
PLATELETS: 250 10*3/uL (ref 150–400)
RBC: 3.91 MIL/uL (ref 3.87–5.11)
RDW: 14.2 % (ref 11.5–15.5)
WBC: 6.3 10*3/uL (ref 4.0–10.5)

## 2016-04-24 LAB — LIPASE, BLOOD: LIPASE: 63 U/L — AB (ref 11–51)

## 2016-04-24 MED ORDER — ONDANSETRON HCL 4 MG/2ML IJ SOLN
4.0000 mg | Freq: Once | INTRAMUSCULAR | Status: AC
  Administered 2016-04-24: 4 mg via INTRAVENOUS
  Filled 2016-04-24: qty 2

## 2016-04-24 MED ORDER — HYDROMORPHONE HCL 1 MG/ML IJ SOLN
0.5000 mg | Freq: Once | INTRAMUSCULAR | Status: AC
  Administered 2016-04-24: 0.5 mg via INTRAVENOUS
  Filled 2016-04-24: qty 1

## 2016-04-24 NOTE — ED Provider Notes (Signed)
[facility] DEPT Provider Note   CSN: [id number] Arrival date & time: 04/24/16  2129  By signing my name below, I, Jeanell Sparrow, attest that this documentation has been prepared under the direction and in the presence of non-physician practitioner, Alecia Lemming, PA-C. Electronically Signed: Jeanell Sparrow, Scribe. 04/24/2016. 10:40 PM.  History   Chief Complaint Chief Complaint  Patient presents with  . Cancer  . Abdominal Pain   The history is provided by the patient and the spouse. No language interpreter was used.   HPI Comments: Joann Herrera is a 82 y.o. female with history of gallstones, colon CA -- who presents to the Emergency Department complaining of intermittent moderate abdominal pain that started this afternoon. She states has been having pain episodes intermittently since onset of colon cancer 2 years ago. Pain is cancer-related and unrelieved by daily tramadol. She reports associated right flank pain. She denies any vomiting, fever, SOB, chest pain, or other complaints.     PCP: [name], MD  Past Medical History:  Diagnosis Date  . Cancer (HCC)    squamous cell  . Colon carcinoma ([place])    dx. left Colon cancer,evidence of metastasis  . Complication of anesthesia    "morphine caused severe shakes"  . GERD (gastroesophageal reflux disease)   . Macular degeneration   . Pneumonia 04/2012  . PONV (postoperative nausea and vomiting)   . Rheumatoid Arthritis     Patient Active Problem List   Diagnosis Date Noted  . Dehydration 09/08/2015  . Constipation 09/08/2015  . Atypical chest pain   . Metastatic colon cancer in female [facility])   . Cancer of left colon ([place]) 05/18/2015  . Abdominal aortic aneurysm ([place]) 04/21/2015  . Cholelithiasis 03/17/2015  . Dilated cbd, acquired 03/17/2015  . Gastritis 02/25/2015  . Aortic arch atherosclerosis ([place]) 02/25/2015  . Numbness 02/25/2015  . Nicotine dependence 02/25/2015  . Abdominal pain, epigastric  01/06/2015  . Hypertriglyceridemia 12/11/2014  . Vertigo 11/16/2014  . CVA (cerebral infarction) 11/16/2014  . Arthritis 11/16/2014  . Pre-syncope 11/16/2014  . Hypocalcemia 11/16/2014  . Squamous cell carcinoma 07/30/2014  . Insomnia 03/17/2014  . Fatigue 09/13/2013  . Depression 09/13/2013  . GERD (gastroesophageal reflux disease) 09/02/2013  . Unsteady gait 09/02/2013  . Dry eye 06/27/2013  . 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 Horseshoe tear of retina without detachment 03/31/2011  . Age-related macular degeneration, dry 03/31/2011    Past Surgical History:  Procedure Laterality Date  . CATARACT EXTRACTION, BILATERAL    . Cataracts    . COLONOSCOPY WITH PROPOFOL N/A 04/30/2015   Procedure: COLONOSCOPY WITH PROPOFOL;  Surgeon: Milus Banister, MD;  Location: [facility] ENDOSCOPY;  Service: Endoscopy;  Laterality: N/A;  . ESOPHAGOGASTRODUODENOSCOPY N/A 01/22/2015   Procedure: ESOPHAGOGASTRODUODENOSCOPY (EGD);  Surgeon: Milus Banister, MD;  Location: [facility];  Service: Endoscopy;  Laterality: N/A;  . EYE SURGERY     bilateral cataracts  . HAND SURGERY     Pt states she had both hands operated on for arthritis.  Marland Kitchen HAND SURGERY    . HEMORRHOID SURGERY    . IR GENERIC HISTORICAL  02/12/2016   IR US GUIDE VASC ACCESS RIGHT 02/12/2016 Corrie Mckusick, DO [facility]-INTER[facility]  . IR GENERIC HISTORICAL  02/12/2016   IR FLUORO GUIDE PORT INSERTION RIGHT 02/12/2016 Corrie Mckusick, DO [facility]  . JOINT REPLACEMENT    . MINOR HEMORRHOIDECTOMY    . SHOULDER ARTHROSCOPY Left   . SHOULDER SURGERY Left  arthroscopy  . TONSILLECTOMY    . TOTAL KNEE ARTHROPLASTY     x 2  . TOTAL KNEE ARTHROPLASTY Right   . TOTAL KNEE ARTHROPLASTY Left     OB History    Gravida Para Term Preterm AB Living   0 0 0 0 0     SAB TAB Ectopic Multiple Live Births   0 0 0           Home Medications    Prior to Admission medications   Medication Sig Start Date End Date Taking? Authorizing Provider    acetaminophen (TYLENOL) 500 MG tablet Take 1,000 mg by mouth every 6 (six) hours as needed. For pain    Historical Provider, MD  lidocaine-prilocaine (EMLA) cream Apply 1 application topically as needed. 04/08/16   Owens Shark, NP  ondansetron (ZOFRAN-ODT) 8 MG disintegrating tablet Take 8 mg by mouth 3 (three) times daily as needed. For nausea. 08/14/15   Historical Provider, MD  Polyethyl Glycol-Propyl Glycol (SYSTANE OP) Apply 1-2 drops to eye 4 (four) times daily as needed (for dry eye).    Historical Provider, MD  prochlorperazine (COMPAZINE) 10 MG tablet Take 1 tablet (10 mg total) by mouth every 6 (six) hours as needed. Patient not taking: Reported on 04/08/2016 07/31/15   Truitt Merle, MD  traMADol (ULTRAM) 50 MG tablet Take 1 tablet (50 mg total) by mouth every 8 (eight) hours as needed. 04/08/16   Owens Shark, NP    Family History Family History  Problem Relation Age of Onset  . Cancer Sister 39    breast  . Diabetes Sister   . Arthritis Mother   . Arthritis-Osteo Mother   . Diabetes Brother   . Suicidality Brother   . Diabetes Brother   . Breast cancer Sister   . Cancer Maternal Aunt     breast cancer     Social History Social History  Substance Use Topics  . Smoking status: Current Every Day Smoker    Packs/day: 1.00  . Smokeless tobacco: Never Used  . Alcohol use No     Allergies   Aspirin; Codeine; Morphine and related; and Penicillins   Review of Systems Review of Systems  Constitutional: Negative for fever.  HENT: Negative for rhinorrhea and sore throat.   Eyes: Negative for redness.  Respiratory: Negative for cough and shortness of breath.   Cardiovascular: Negative for chest pain.  Gastrointestinal: Positive for abdominal pain. Negative for diarrhea, nausea and vomiting.  Genitourinary: Positive for flank pain (Right). Negative for dysuria.  Musculoskeletal: Negative for myalgias.  Skin: Negative for rash.  Neurological: Negative for headaches.      Physical Exam Updated Vital Signs BP 119/65 (BP Location: Left Arm)   Pulse 76   Temp 98.2 F (36.8 C) (Oral)   Resp 18   SpO2 97%   Physical Exam  Constitutional: She appears well-developed and well-nourished. No distress.  HENT:  Head: Normocephalic and atraumatic.  Eyes: Conjunctivae are normal. Right eye exhibits no discharge. Left eye exhibits no discharge.  Neck: Normal range of motion. Neck supple.  Cardiovascular: Normal rate, regular rhythm and normal heart sounds.   Pulmonary/Chest: Effort normal and breath sounds normal.  Abdominal: Soft. There is no tenderness.  Mild to moderate tenderness to palpation across the bilateral upper abdomen.  Musculoskeletal: Normal range of motion.  Neurological: She is alert.  Skin: Skin is warm and dry.  Psychiatric: She has a normal mood and affect.  Nursing note and vitals  reviewed.    ED Treatments / Results  DIAGNOSTIC STUDIES: Oxygen Saturation is 97% on RA, normal by my interpretation.    COORDINATION OF CARE: 10:45 PM- Pt advised of plan for treatment and pt agrees.  Labs (all labs ordered are listed, but only abnormal results are displayed) Labs Reviewed  LIPASE, BLOOD - Abnormal; Notable for the following:       Result Value   Lipase 63 (*)    All other components within normal limits  COMPREHENSIVE METABOLIC PANEL - Abnormal; Notable for the following:    Glucose, Bld 117 (*)    Calcium 8.7 (*)    Alkaline Phosphatase 342 (*)    GFR calc non Af Amer 48 (*)    GFR calc Af Amer 56 (*)    All other components within normal limits  CBC - Abnormal; Notable for the following:    Hemoglobin 11.0 (*)    HCT 33.6 (*)    All other components within normal limits  URINALYSIS, ROUTINE W REFLEX MICROSCOPIC - Abnormal; Notable for the following:    Hgb urine dipstick SMALL (*)    Leukocytes, UA SMALL (*)    Bacteria, UA RARE (*)    Squamous Epithelial / LPF 6-30 (*)    All other components within normal limits     Radiology Ct Abdomen Pelvis Wo Contrast  Result Date: 04/25/2016 CLINICAL DATA:  Intermittent abdominal pain, history of colon cancer EXAM: CT ABDOMEN AND PELVIS WITHOUT CONTRAST TECHNIQUE: Multidetector CT imaging of the abdomen and pelvis was performed following the standard protocol without IV contrast. COMPARISON:  01/29/2016 FINDINGS: Lower chest: No pleural effusion is visualized. Mild subpleural fibrosis and bronchiectasis again evident. Slight increased consolidation within the lingula and anterior left lung base with associated mildly dilated bronchi. Right subpleural density slightly smaller. Now contains air bronchograms. Patchy slightly nodular appearing infiltrates are present within the right greater than left posterior lung bases. Coronary artery calcifications. Heart size within normal limits. No large pericardial effusion. Hepatobiliary: Low-attenuation at the porta hepatis could relate to periportal edema. No gross focal hepatic abnormalities. Multiple stones present within the gallbladder lumen. No wall thickening. Extrahepatic common bile duct is enlarged, measuring up to 16 mm, similar compared to prior. Pancreas: No peripancreatic inflammation. Spleen: Normal in size without focal abnormality. Adrenals/Urinary Tract: Adrenal glands are within normal limits. Kidneys demonstrate no hydronephrosis or calcification. The bladder is normal. Stomach/Bowel: The stomach is nonenlarged. There is no dilated small bowel. Mild low sigmoid colon wall thickening presumably corresponds to history of colon cancer, this is less apparent compared to the previous exam. Appendix normal. Vascular/Lymphatic: Extensive atherosclerotic vascular disease of the aorta with focal ectasia of the infrarenal aorta. Porta hepatis adenopathy is poorly defined without contrast. Reproductive: Uterus and bilateral adnexa are unremarkable. Other: No free air or free fluid. Musculoskeletal: Degenerative changes of the spine.  No suspicious or acute bone lesions are visualized. IMPRESSION: 1. Slight increased consolidation within the lingula and anterior left lung base, could relate to inflammation or infection, but progression from prior exam makes malignancy difficult to exclude. 2. Slight increased nodular appearing infiltrates within the right greater than left posterior lung bases. 3. Multiple gallstones. Dilated extrahepatic common bowel duct is similar compared to the prior study. 4. Focal wall thickening involving the distal sigmoid colon presumably corresponds to the history of colon cancer. Porta hepatis adenopathy is poorly defined without contrast. Electronically Signed   By: Donavan Foil M.D.   On: 04/25/2016 02:41  Procedures Procedures (including critical care time)  Medications Ordered in ED Medications  HYDROmorphone (DILAUDID) injection 0.5 mg (not administered)  ondansetron (ZOFRAN) injection 4 mg (not administered)     Initial Impression / Assessment and Plan / ED Course  I have reviewed the triage vital signs and the nursing notes.  Pertinent labs & imaging results that were available during my care of the patient were reviewed by me and considered in my medical decision making (see chart for details).  Clinical Course    Patient seen and examined. Work-up initiated. Medications ordered.   Patient discussed with and seen by Dr. Darl Householder. CT scan ordered.  3:36 AM CT results discussed with Dr. Kathrynn Humble. Patient and family updated on CT results. Patient is currently pain-free. She wants to go home. Will discharge to home with Percocet. I had a long discussion with patient and family that she should use this medication at the lowest dose needed under supervision to prevent falls. Encouraged PCP follow-up. She states that she is following up on Friday (4 days).  The patient was urged to return to the Emergency Department immediately with worsening of current symptoms, worsening abdominal pain,  persistent vomiting, blood noted in stools, fever, or any other concerns. The patient verbalized understanding.    Final Clinical Impressions(s) / ED Diagnoses   Final diagnoses:  Pain of upper abdomen  Calculus of gallbladder without cholecystitis without obstruction   Patient with colon cancer and abdominal pain. Her pain tonight seems to be similar to previous however unrelieved with tramadol. CT scan shows gallstones with unchanged dilated bile duct. Patient has very slight elevation in lipase and slight elevation in alkaline phosphatase. It is hard to know if this is from liver, bone, or related to her cancer. Pain is well controlled in emergency department. She wants to go home. No recent for admission at this time. She has appropriate follow-up.  New Prescriptions New Prescriptions   ONDANSETRON (ZOFRAN ODT) 4 MG DISINTEGRATING TABLET    Take 1 tablet (4 mg total) by mouth every 8 (eight) hours as needed for nausea or vomiting.   OXYCODONE-ACETAMINOPHEN (PERCOCET/ROXICET) 5-325 MG TABLET    Take 0.5-1 tablets by mouth every 6 (six) hours as needed for severe pain.   I personally performed the services described in this documentation, which was scribed in my presence. The recorded information has been reviewed and is accurate.     Carlisle Cater, PA-C 04/25/16 Foss Yao, MD 04/25/16 [phone]

## 2016-04-24 NOTE — ED Notes (Signed)
Pt states unable to void at this time. 

## 2016-04-24 NOTE — ED Triage Notes (Signed)
Pt states that she has had abdominal pain most of the day today. Hx of stage 4 colon CA. Alert and oriented. Took Tramadol at home w/o relief.

## 2016-04-25 ENCOUNTER — Emergency Department (HOSPITAL_COMMUNITY): Payer: Medicare Other

## 2016-04-25 DIAGNOSIS — Z8673 Personal history of transient ischemic attack (TIA), and cerebral infarction without residual deficits: Secondary | ICD-10-CM | POA: Diagnosis not present

## 2016-04-25 DIAGNOSIS — R109 Unspecified abdominal pain: Secondary | ICD-10-CM | POA: Diagnosis present

## 2016-04-25 DIAGNOSIS — Z79899 Other long term (current) drug therapy: Secondary | ICD-10-CM | POA: Diagnosis not present

## 2016-04-25 DIAGNOSIS — Z96653 Presence of artificial knee joint, bilateral: Secondary | ICD-10-CM | POA: Diagnosis not present

## 2016-04-25 DIAGNOSIS — K8020 Calculus of gallbladder without cholecystitis without obstruction: Secondary | ICD-10-CM | POA: Diagnosis not present

## 2016-04-25 DIAGNOSIS — Z85038 Personal history of other malignant neoplasm of large intestine: Secondary | ICD-10-CM | POA: Diagnosis not present

## 2016-04-25 DIAGNOSIS — F17200 Nicotine dependence, unspecified, uncomplicated: Secondary | ICD-10-CM | POA: Diagnosis not present

## 2016-04-25 LAB — URINALYSIS, ROUTINE W REFLEX MICROSCOPIC
Bilirubin Urine: NEGATIVE
GLUCOSE, UA: NEGATIVE mg/dL
Ketones, ur: NEGATIVE mg/dL
Nitrite: NEGATIVE
PH: 7 (ref 5.0–8.0)
Protein, ur: NEGATIVE mg/dL
SPECIFIC GRAVITY, URINE: 1.013 (ref 1.005–1.030)

## 2016-04-25 MED ORDER — IOPAMIDOL (ISOVUE-300) INJECTION 61%
INTRAVENOUS | Status: AC
  Administered 2016-04-25: 30 mL via ORAL
  Filled 2016-04-25: qty 30

## 2016-04-25 MED ORDER — IOPAMIDOL (ISOVUE-300) INJECTION 61%
30.0000 mL | Freq: Once | INTRAVENOUS | Status: AC | PRN
  Administered 2016-04-25: 30 mL via ORAL

## 2016-04-25 MED ORDER — OXYCODONE-ACETAMINOPHEN 5-325 MG PO TABS: 0.5000 | ORAL_TABLET | Freq: Four times a day (QID) | ORAL | 0 refills | Status: DC | PRN

## 2016-04-25 MED ORDER — HEPARIN SOD (PORK) LOCK FLUSH 100 UNIT/ML IV SOLN
500.0000 [IU] | Freq: Once | INTRAVENOUS | Status: DC
  Filled 2016-04-25: qty 5

## 2016-04-25 MED ORDER — ONDANSETRON 4 MG PO TBDP: 4.0000 mg | ORAL_TABLET | Freq: Three times a day (TID) | ORAL | 0 refills | Status: DC | PRN

## 2016-04-25 NOTE — Discharge Instructions (Signed)
Please read and follow all provided instructions.  Your diagnoses today include:  1. Pain of upper abdomen   2. Calculus of gallbladder without cholecystitis without obstruction     Tests performed today include:  Blood counts and electrolytes  Blood tests to check liver and kidney function - high alkaline phosphatase level that needs to be rechecked by your doctor  Blood tests to check pancreas function  Urine test to look for infection  CT scan - shows gallstones, known cancer   Vital signs. See below for your results today.   Medications prescribed:   Percocet (oxycodone/acetaminophen) - narcotic pain medication  DO NOT drive or perform any activities that require you to be awake and alert because this medicine can make you drowsy. BE VERY CAREFUL not to take multiple medicines containing Tylenol (also called acetaminophen). Doing so can lead to an overdose which can damage your liver and cause liver failure and possibly death.   Zofran (ondansetron) - for nausea and vomiting  Take any prescribed medications only as directed.  Home care instructions:   Follow any educational materials contained in this packet.  Follow-up instructions: Please follow-up with your primary care provider in the next 3 days for further evaluation of your symptoms.    Return instructions:  SEEK IMMEDIATE MEDICAL ATTENTION IF:  The pain does not go away or becomes severe   A temperature above 101F develops   Repeated vomiting occurs (multiple episodes)   The pain becomes localized to portions of the abdomen. The right side could possibly be appendicitis. In an adult, the left lower portion of the abdomen could be colitis or diverticulitis.   Blood is being passed in stools or vomit (bright red or black tarry stools)   You develop chest pain, difficulty breathing, dizziness or fainting, or become confused, poorly responsive, or inconsolable (young children)  If you have any other  emergent concerns regarding your health  Additional Information: Abdominal (belly) pain can be caused by many things. Your caregiver performed an examination and possibly ordered blood/urine tests and imaging (CT scan, x-rays, ultrasound). Many cases can be observed and treated at home after initial evaluation in the emergency department. Even though you are being discharged home, abdominal pain can be unpredictable. Therefore, you need a repeated exam if your pain does not resolve, returns, or worsens. Most patients with abdominal pain don't have to be admitted to the hospital or have surgery, but serious problems like appendicitis and gallbladder attacks can start out as nonspecific pain. Many abdominal conditions cannot be diagnosed in one visit, so follow-up evaluations are very important.  Your vital signs today were: BP 127/70    Pulse (!) 55    Temp 98.2 F (36.8 C) (Oral)    Resp 18    SpO2 100%  If your blood pressure (bp) was elevated above 135/85 this visit, please have this repeated by your doctor within one month. --------------

## 2016-04-25 NOTE — ED Notes (Signed)
Patient transported to CT 

## 2016-04-28 ENCOUNTER — Emergency Department (HOSPITAL_COMMUNITY)
Admission: EM | Admit: 2016-04-28 | Discharge: 2016-04-29 | Disposition: A | Discharge status: Home / Self Care | Payer: Medicare Other | Attending: Emergency Medicine | Admitting: Emergency Medicine

## 2016-04-28 ENCOUNTER — Encounter (HOSPITAL_COMMUNITY): Payer: Self-pay | Admitting: Emergency Medicine

## 2016-04-28 DIAGNOSIS — Z85828 Personal history of other malignant neoplasm of skin: Secondary | ICD-10-CM | POA: Diagnosis not present

## 2016-04-28 DIAGNOSIS — G8929 Other chronic pain: Secondary | ICD-10-CM | POA: Insufficient documentation

## 2016-04-28 DIAGNOSIS — F17200 Nicotine dependence, unspecified, uncomplicated: Secondary | ICD-10-CM | POA: Diagnosis not present

## 2016-04-28 DIAGNOSIS — Z96653 Presence of artificial knee joint, bilateral: Secondary | ICD-10-CM | POA: Insufficient documentation

## 2016-04-28 DIAGNOSIS — R1011 Right upper quadrant pain: Secondary | ICD-10-CM | POA: Diagnosis not present

## 2016-04-28 DIAGNOSIS — Z85038 Personal history of other malignant neoplasm of large intestine: Secondary | ICD-10-CM | POA: Diagnosis not present

## 2016-04-28 DIAGNOSIS — R1031 Right lower quadrant pain: Secondary | ICD-10-CM | POA: Insufficient documentation

## 2016-04-28 DIAGNOSIS — Z79899 Other long term (current) drug therapy: Secondary | ICD-10-CM | POA: Diagnosis not present

## 2016-04-28 DIAGNOSIS — Z8673 Personal history of transient ischemic attack (TIA), and cerebral infarction without residual deficits: Secondary | ICD-10-CM | POA: Diagnosis not present

## 2016-04-28 DIAGNOSIS — R109 Unspecified abdominal pain: Secondary | ICD-10-CM

## 2016-04-28 NOTE — ED Provider Notes (Signed)
[facility] DEPT Provider Note   CSN: [id number] Arrival date & time: 04/28/16  2211  By signing my name below, I, Dolores Hoose, attest that this documentation has been prepared under the direction and in the presence of Sherwood Gambler, MD . Electronically Signed: Dolores Hoose, Scribe. 04/28/2016. 11:50 PM.  History   Chief Complaint Chief Complaint  Patient presents with  . Abdominal Pain   The history is provided by the patient. No language interpreter was used.    HPI Comments:  Joann Herrera is a 83 y.o. female with pmhx of CA and GERD who presents to the Emergency Department complaining of returned constant moderate-severe RLQ and RUQ abdominal pain beginning about 6 hours ago. Pt was seen 5 days ago for the same and was given dilaudid IV in the ED and given pain medication prescription to take home. She states the dilaudid she was given in the ED relieved her pain for the past 5 days and would like another shot. Pt has tried pain medication at home with no relief. Pt denies any fever, nausea, diarrhea or vomiting. She has an appointment with her oncologist today and states that she just wants to make it through the night pain-free.  Past Medical History:  Diagnosis Date  . Cancer (HCC)    squamous cell  . Colon carcinoma ([place])    dx. left Colon cancer,evidence of metastasis  . Complication of anesthesia    "morphine caused severe shakes"  . GERD (gastroesophageal reflux disease)   . Macular degeneration   . Pneumonia 04/2012  . PONV (postoperative nausea and vomiting)   . Rheumatoid Arthritis     Patient Active Problem List   Diagnosis Date Noted  . Dehydration 09/08/2015  . Constipation 09/08/2015  . Atypical chest pain   . Metastatic colon cancer in female [facility])   . Cancer of left colon ([place]) 05/18/2015  . Abdominal aortic aneurysm ([place]) 04/21/2015  . Cholelithiasis 03/17/2015  . Dilated cbd, acquired 03/17/2015  . Gastritis 02/25/2015  . Aortic arch  atherosclerosis ([place]) 02/25/2015  . Numbness 02/25/2015  . Nicotine dependence 02/25/2015  . Abdominal pain, epigastric 01/06/2015  . Hypertriglyceridemia 12/11/2014  . Vertigo 11/16/2014  . CVA (cerebral infarction) 11/16/2014  . Arthritis 11/16/2014  . Pre-syncope 11/16/2014  . Hypocalcemia 11/16/2014  . Squamous cell carcinoma 07/30/2014  . Insomnia 03/17/2014  . Fatigue 09/13/2013  . Depression 09/13/2013  . GERD (gastroesophageal reflux disease) 09/02/2013  . Unsteady gait 09/02/2013  . Dry eye 06/27/2013  . 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 Horseshoe tear of retina without detachment 03/31/2011  . Age-related macular degeneration, dry 03/31/2011    Past Surgical History:  Procedure Laterality Date  . CATARACT EXTRACTION, BILATERAL    . Cataracts    . COLONOSCOPY WITH PROPOFOL N/A 04/30/2015   Procedure: COLONOSCOPY WITH PROPOFOL;  Surgeon: Milus Banister, MD;  Location: [facility];  Service: Endoscopy;  Laterality: N/A;  . ESOPHAGOGASTRODUODENOSCOPY N/A 01/22/2015   Procedure: ESOPHAGOGASTRODUODENOSCOPY (EGD);  Surgeon: Milus Banister, MD;  Location: [facility];  Service: Endoscopy;  Laterality: N/A;  . EYE SURGERY     bilateral cataracts  . HAND SURGERY     Pt states she had both hands operated on for arthritis.  Marland Kitchen HAND SURGERY    . HEMORRHOID SURGERY    . IR GENERIC HISTORICAL  02/12/2016   IR US GUIDE VASC ACCESS RIGHT 02/12/2016 Corrie Mckusick, DO [facility]  . IR GENERIC HISTORICAL  02/12/2016   IR  FLUORO GUIDE PORT INSERTION RIGHT 02/12/2016 Corrie Mckusick, DO [facility]  . JOINT REPLACEMENT    . MINOR HEMORRHOIDECTOMY    . SHOULDER ARTHROSCOPY Left   . SHOULDER SURGERY Left    arthroscopy  . TONSILLECTOMY    . TOTAL KNEE ARTHROPLASTY     x 2  . TOTAL KNEE ARTHROPLASTY Right   . TOTAL KNEE ARTHROPLASTY Left     OB History    Gravida Para Term Preterm AB Living   0 0 0 0 0     SAB TAB Ectopic Multiple Live Births   0 0 0            Home Medications    Prior to Admission medications   Medication Sig Start Date End Date Taking? Authorizing Provider  acetaminophen (TYLENOL) 500 MG tablet Take 1,000 mg by mouth every 6 (six) hours as needed for mild pain or moderate pain.    Yes Historical Provider, MD  lidocaine-prilocaine (EMLA) cream Apply 1 application topically as needed. 04/08/16  Yes Owens Shark, NP  ondansetron (ZOFRAN) 8 MG tablet Take 8 mg by mouth every 8 (eight) hours as needed for nausea or vomiting.   Yes Historical Provider, MD  Polyethyl Glycol-Propyl Glycol (SYSTANE OP) Apply 1-2 drops to eye 4 (four) times daily as needed (for dry eye).   Yes Historical Provider, MD  traMADol (ULTRAM) 50 MG tablet Take 1 tablet (50 mg total) by mouth every 8 (eight) hours as needed. Patient taking differently: Take 50 mg by mouth every 8 (eight) hours as needed for moderate pain or severe pain.  04/08/16  Yes Owens Shark, NP  ondansetron (ZOFRAN ODT) 4 MG disintegrating tablet Take 1 tablet (4 mg total) by mouth every 8 (eight) hours as needed for nausea or vomiting. Patient not taking: Reported on 04/28/2016 04/25/16   Carlisle Cater, PA-C  oxyCODONE-acetaminophen (PERCOCET/ROXICET) 5-325 MG tablet Take 0.5-1 tablets by mouth every 6 (six) hours as needed for severe pain. 04/25/16   Carlisle Cater, PA-C  prochlorperazine (COMPAZINE) 10 MG tablet Take 1 tablet (10 mg total) by mouth every 6 (six) hours as needed. Patient not taking: Reported on 04/24/2016 07/31/15   Truitt Merle, MD    Family History Family History  Problem Relation Age of Onset  . Cancer Sister 49    breast  . Diabetes Sister   . Arthritis Mother   . Arthritis-Osteo Mother   . Diabetes Brother   . Suicidality Brother   . Diabetes Brother   . Breast cancer Sister   . Cancer Maternal Aunt     breast cancer     Social History Social History  Substance Use Topics  . Smoking status: Current Every Day Smoker    Packs/day: 1.00  . Smokeless tobacco:  Never Used  . Alcohol use No     Allergies   Aspirin; Codeine; Morphine and related; and Penicillins   Review of Systems Review of Systems  Constitutional: Negative for fever.  Gastrointestinal: Positive for abdominal pain. Negative for diarrhea, nausea and vomiting.  All other systems reviewed and are negative.    Physical Exam Updated Vital Signs BP 111/64 (BP Location: Right Arm)   Pulse 73   Temp 97.6 F (36.4 C) (Oral)   Resp 24   SpO2 97%   Physical Exam  Constitutional: She is oriented to person, place, and time. She appears well-developed and well-nourished.  HENT:  Head: Normocephalic and atraumatic.  Right Ear: External ear normal.  Left  Ear: External ear normal.  Nose: Nose normal.  Eyes: Right eye exhibits no discharge. Left eye exhibits no discharge.  Cardiovascular: Normal rate, regular rhythm and normal heart sounds.   Pulmonary/Chest: Effort normal and breath sounds normal.  Abdominal: Soft. There is tenderness (RUQ and RLQ tenderness).  Neurological: She is alert and oriented to person, place, and time.  Skin: Skin is warm and dry.  Nursing note and vitals reviewed.    ED Treatments / Results  DIAGNOSTIC STUDIES:  Oxygen Saturation is 97% on RA, normal by my interpretation.    COORDINATION OF CARE:  12:03 AM Discussed treatment plan with pt at bedside which includes IM pain medication and pt agreed to plan.  Labs (all labs ordered are listed, but only abnormal results are displayed) Labs Reviewed - No data to display  EKG  EKG Interpretation None       Radiology No results found.  Procedures Procedures (including critical care time)  Medications Ordered in ED Medications - No data to display   Initial Impression / Assessment and Plan / ED Course  I have reviewed the triage vital signs and the nursing notes.  Pertinent labs & imaging results that were available during my care of the patient were reviewed by me and considered  in my medical decision making (see chart for details).  Clinical Course as of Apr 29 2  Fri Apr 29, 2016  0003 IM dilaudid, observe. She doesn't want labs or eval, just pain meds.  [SG]    Clinical Course User Index [SG] Sherwood Gambler, MD    This appears to be an exacerbation of chronic right sided abd pain. Has known about gallstones for some time. No concerning findings such as new vomiting, fevers or change in pain. Much better after IM dilaudid. Has oncology appt this morning. F/u with them for change in chronic pain medication.  Final Clinical Impressions(s) / ED Diagnoses   Final diagnoses:  Chronic abdominal pain    New Prescriptions New Prescriptions   No medications on file   I personally performed the services described in this documentation, which was scribed in my presence. The recorded information has been reviewed and is accurate.     Sherwood Gambler, MD 04/29/16 [phone]

## 2016-04-28 NOTE — ED Triage Notes (Addendum)
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

## 2016-04-29 ENCOUNTER — Telehealth: Payer: Self-pay | Admitting: Hematology

## 2016-04-29 ENCOUNTER — Ambulatory Visit: Payer: Medicare Other

## 2016-04-29 ENCOUNTER — Encounter: Payer: Self-pay | Admitting: Hematology

## 2016-04-29 ENCOUNTER — Other Ambulatory Visit: Payer: Medicare Other

## 2016-04-29 ENCOUNTER — Ambulatory Visit (HOSPITAL_BASED_OUTPATIENT_CLINIC_OR_DEPARTMENT_OTHER): Payer: Medicare Other | Admitting: Hematology

## 2016-04-29 VITALS — BP 112/47 | HR 59 | Temp 97.9°F | Resp 18 | Ht 63.0 in | Wt 126.6 lb

## 2016-04-29 DIAGNOSIS — C186 Malignant neoplasm of descending colon: Secondary | ICD-10-CM

## 2016-04-29 DIAGNOSIS — R530 Neoplastic (malignant) related fatigue: Secondary | ICD-10-CM

## 2016-04-29 DIAGNOSIS — G4700 Insomnia, unspecified: Secondary | ICD-10-CM

## 2016-04-29 DIAGNOSIS — R1031 Right lower quadrant pain: Secondary | ICD-10-CM | POA: Diagnosis not present

## 2016-04-29 DIAGNOSIS — R1013 Epigastric pain: Secondary | ICD-10-CM | POA: Diagnosis not present

## 2016-04-29 DIAGNOSIS — C187 Malignant neoplasm of sigmoid colon: Secondary | ICD-10-CM

## 2016-04-29 DIAGNOSIS — R110 Nausea: Secondary | ICD-10-CM

## 2016-04-29 DIAGNOSIS — E46 Unspecified protein-calorie malnutrition: Secondary | ICD-10-CM

## 2016-04-29 DIAGNOSIS — R5382 Chronic fatigue, unspecified: Secondary | ICD-10-CM

## 2016-04-29 MED ORDER — ONDANSETRON HCL 8 MG PO TABS: 8.0000 mg | ORAL_TABLET | Freq: Three times a day (TID) | ORAL | 2 refills | Status: AC | PRN

## 2016-04-29 MED ORDER — HYDROMORPHONE HCL 1 MG/ML IJ SOLN
1.0000 mg | Freq: Once | INTRAMUSCULAR | Status: AC
  Administered 2016-04-29: 1 mg via INTRAMUSCULAR
  Filled 2016-04-29: qty 1

## 2016-04-29 MED ORDER — HYDROMORPHONE HCL 2 MG PO TABS: 1.0000 mg | ORAL_TABLET | ORAL | 0 refills | Status: DC | PRN

## 2016-04-29 MED FILL — HYDROmorphone HCL 2 MG TABS: 2 | 7 days supply | Qty: 45 | Fill #0

## 2016-04-29 MED FILL — ONDANSETRON HCL 8 MG TABLET: 8 | 10 days supply | Qty: 30 | Fill #0

## 2016-04-29 NOTE — Telephone Encounter (Signed)
Appointments scheduled per 1/5 LOS. Patient given AVS report and calendars with future scheduled appointments. °

## 2016-04-29 NOTE — Progress Notes (Signed)
Patient states she does not want port access, labs drawn, or treatment today "only to see the doctor".  Sent patient with tubes back to lobby to await doctor. Notified Dr. Ernestina Penna desk nurse.  Wylene Simmer, BSN, RN 04/29/2016 10:14 AM

## 2016-05-01 ENCOUNTER — Emergency Department (HOSPITAL_COMMUNITY)
Admission: EM | Admit: 2016-05-01 | Discharge: 2016-05-01 | Disposition: A | Discharge status: Home / Self Care | Payer: Medicare Other | Source: Home / Self Care | Attending: Emergency Medicine | Admitting: Emergency Medicine

## 2016-05-01 ENCOUNTER — Encounter (HOSPITAL_COMMUNITY): Payer: Self-pay | Admitting: Emergency Medicine

## 2016-05-01 DIAGNOSIS — R1031 Right lower quadrant pain: Secondary | ICD-10-CM | POA: Insufficient documentation

## 2016-05-01 DIAGNOSIS — Z8673 Personal history of transient ischemic attack (TIA), and cerebral infarction without residual deficits: Secondary | ICD-10-CM

## 2016-05-01 DIAGNOSIS — R109 Unspecified abdominal pain: Secondary | ICD-10-CM

## 2016-05-01 DIAGNOSIS — K8020 Calculus of gallbladder without cholecystitis without obstruction: Secondary | ICD-10-CM | POA: Insufficient documentation

## 2016-05-01 DIAGNOSIS — R1013 Epigastric pain: Secondary | ICD-10-CM | POA: Diagnosis not present

## 2016-05-01 DIAGNOSIS — C785 Secondary malignant neoplasm of large intestine and rectum: Secondary | ICD-10-CM | POA: Insufficient documentation

## 2016-05-01 DIAGNOSIS — Z96653 Presence of artificial knee joint, bilateral: Secondary | ICD-10-CM

## 2016-05-01 DIAGNOSIS — C799 Secondary malignant neoplasm of unspecified site: Secondary | ICD-10-CM

## 2016-05-01 DIAGNOSIS — Z79899 Other long term (current) drug therapy: Secondary | ICD-10-CM

## 2016-05-01 DIAGNOSIS — F17200 Nicotine dependence, unspecified, uncomplicated: Secondary | ICD-10-CM | POA: Insufficient documentation

## 2016-05-01 DIAGNOSIS — R1011 Right upper quadrant pain: Secondary | ICD-10-CM | POA: Diagnosis not present

## 2016-05-01 DIAGNOSIS — K812 Acute cholecystitis with chronic cholecystitis: Secondary | ICD-10-CM | POA: Diagnosis not present

## 2016-05-01 LAB — COMPREHENSIVE METABOLIC PANEL
ALBUMIN: 3.1 g/dL — AB (ref 3.5–5.0)
ALK PHOS: 314 U/L — AB (ref 38–126)
ALT: 43 U/L (ref 14–54)
ANION GAP: 9 (ref 5–15)
AST: 26 U/L (ref 15–41)
BILIRUBIN TOTAL: 0.3 mg/dL (ref 0.3–1.2)
BUN: 18 mg/dL (ref 6–20)
CALCIUM: 8.3 mg/dL — AB (ref 8.9–10.3)
CO2: 23 mmol/L (ref 22–32)
CREATININE: 0.68 mg/dL (ref 0.44–1.00)
Chloride: 104 mmol/L (ref 101–111)
GFR calc Af Amer: >60 mL/min (ref >60)
GFR calc non Af Amer: >60 mL/min (ref >60)
GLUCOSE: 109 mg/dL — AB (ref 65–99)
Potassium: 4.3 mmol/L (ref 3.5–5.1)
SODIUM: 136 mmol/L (ref 135–145)
TOTAL PROTEIN: 6.3 g/dL — AB (ref 6.5–8.1)

## 2016-05-01 LAB — CBC WITH DIFFERENTIAL/PLATELET
BASOS PCT: 1 %
Basophils Absolute: 0 10*3/uL (ref 0.0–0.1)
Eosinophils Absolute: 0.4 10*3/uL (ref 0.0–0.7)
Eosinophils Relative: 7 %
HEMATOCRIT: 33.3 % — AB (ref 36.0–46.0)
HEMOGLOBIN: 10.8 g/dL — AB (ref 12.0–15.0)
Lymphocytes Relative: 30 %
Lymphs Abs: 1.8 10*3/uL (ref 0.7–4.0)
MCH: 28.6 pg (ref 26.0–34.0)
MCHC: 32.4 g/dL (ref 30.0–36.0)
MCV: 88.1 fL (ref 78.0–100.0)
MONOS PCT: 9 %
Monocytes Absolute: 0.5 10*3/uL (ref 0.1–1.0)
NEUTROS ABS: 3.2 10*3/uL (ref 1.7–7.7)
NEUTROS PCT: 53 %
Platelets: 292 10*3/uL (ref 150–400)
RBC: 3.78 MIL/uL — AB (ref 3.87–5.11)
RDW: 14.3 % (ref 11.5–15.5)
WBC: 5.9 10*3/uL (ref 4.0–10.5)

## 2016-05-01 LAB — LIPASE, BLOOD: Lipase: 83 U/L — ABNORMAL HIGH (ref 11–51)

## 2016-05-01 MED ORDER — HEPARIN SOD (PORK) LOCK FLUSH 100 UNIT/ML IV SOLN
INTRAVENOUS | Status: AC
  Administered 2016-05-01: 500 [IU]
  Filled 2016-05-01: qty 5

## 2016-05-01 MED ORDER — HYDROMORPHONE HCL 1 MG/ML IJ SOLN
1.0000 mg | Freq: Once | INTRAMUSCULAR | Status: AC
  Administered 2016-05-01: 1 mg via INTRAVENOUS
  Filled 2016-05-01: qty 1

## 2016-05-01 NOTE — Discharge Instructions (Signed)
Return to the ED with any concerns including vomiting and not able to keep down liquids or your medications, abdominal pain especially if it localizes to the right lower abdomen, fever or chills, and decreased urine output, decreased level of alertness or lethargy, or any other alarming symptoms.  °

## 2016-05-01 NOTE — ED Provider Notes (Signed)
[facility] DEPT Provider Note   CSN: [id number] Arrival date & time: 05/01/16  0032    By signing my name below, I, Joann Herrera, attest that this documentation has been prepared under the direction and in the presence of Joann Beers, MD. Electronically Signed: Macon Herrera, ED Scribe. 05/01/16. 2:05 AM.  History   Chief Complaint Chief Complaint  Patient presents with  . Abdominal Pain   The history is provided by the patient and a relative. No language interpreter was used.  Abdominal Pain   This is a recurrent problem. The current episode started more than 1 week ago. The problem occurs constantly. The problem has been gradually worsening. The pain is located in the RLQ and RUQ. The pain is severe. Pertinent negatives include fever and vomiting. Her past medical history is significant for GERD.   HPI Comments: Joann Herrera is a 83 y.o. female with PMHx of CA, cancer and GERD, who presents to the Emergency Department complaining of gradually worsening, severe RLQ and RUQ abdominal pain onset earlier this week. Pt was last seen in the ED on 04/28/16 for similar symptoms and given pain and nausea medication. Pt states her prescribe dilaudid has no relief. She denies any use of OTC medications at home. Denies fever and vomiting. Pt states she has not had her chemotherapy session this week.   Past Medical History:  Diagnosis Date  . Cancer (HCC)    squamous cell  . Colon carcinoma ([place])    dx. left Colon cancer,evidence of metastasis  . Complication of anesthesia    "morphine caused severe shakes"  . GERD (gastroesophageal reflux disease)   . Macular degeneration   . Pneumonia 04/2012  . PONV (postoperative nausea and vomiting)   . Rheumatoid Arthritis     Patient Active Problem List   Diagnosis Date Noted  . Cholecystitis, acute   . Abnormal LFTs   . Calculus of gallbladder and bile duct with cholecystitis with obstruction   . Cholecystitis, chronic   .  Abdominal pain 05/03/2016  . Dehydration 09/08/2015  . Constipation 09/08/2015  . Atypical chest pain   . Metastatic colon cancer in female [facility])   . Cancer of left colon ([place]) 05/18/2015  . Abdominal aortic aneurysm ([place]) 04/21/2015  . Cholelithiasis 03/17/2015  . Dilated cbd, acquired 03/17/2015  . Gastritis 02/25/2015  . Aortic arch atherosclerosis ([place]) 02/25/2015  . Numbness 02/25/2015  . Nicotine dependence 02/25/2015  . Abdominal pain, epigastric 01/06/2015  . Hypertriglyceridemia 12/11/2014  . Vertigo 11/16/2014  . CVA (cerebral infarction) 11/16/2014  . Arthritis 11/16/2014  . Pre-syncope 11/16/2014  . Hypocalcemia 11/16/2014  . Squamous cell carcinoma 07/30/2014  . Insomnia 03/17/2014  . Fatigue 09/13/2013  . Depression 09/13/2013  . GERD (gastroesophageal reflux disease) 09/02/2013  . Unsteady gait 09/02/2013  . Dry eye 06/27/2013  . 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 Horseshoe tear of retina without detachment 03/31/2011  . Age-related macular degeneration, dry 03/31/2011    Past Surgical History:  Procedure Laterality Date  . CATARACT EXTRACTION, BILATERAL    . Cataracts    . COLONOSCOPY WITH PROPOFOL N/A 04/30/2015   Procedure: COLONOSCOPY WITH PROPOFOL;  Surgeon: Milus Banister, MD;  Location: [facility];  Service: Endoscopy;  Laterality: N/A;  . ESOPHAGOGASTRODUODENOSCOPY N/A 01/22/2015   Procedure: ESOPHAGOGASTRODUODENOSCOPY (EGD);  Surgeon: Milus Banister, MD;  Location: [facility];  Service: Endoscopy;  Laterality: N/A;  . EYE SURGERY     bilateral cataracts  . HAND SURGERY  Pt states she had both hands operated on for arthritis.  Marland Kitchen HAND SURGERY    . HEMORRHOID SURGERY    . IR GENERIC HISTORICAL  02/12/2016   IR US GUIDE VASC ACCESS RIGHT 02/12/2016 Corrie Mckusick, DO [facility]  . IR GENERIC HISTORICAL  02/12/2016   IR FLUORO GUIDE PORT INSERTION RIGHT 02/12/2016 Corrie Mckusick, DO [facility]  . IR GENERIC HISTORICAL  05/04/2016     IR PERC CHOLECYSTOSTOMY 05/04/2016 Corrie Mckusick, DO [facility]  . JOINT REPLACEMENT    . MINOR HEMORRHOIDECTOMY    . SHOULDER ARTHROSCOPY Left   . SHOULDER SURGERY Left    arthroscopy  . TONSILLECTOMY    . TOTAL KNEE ARTHROPLASTY     x 2  . TOTAL KNEE ARTHROPLASTY Right   . TOTAL KNEE ARTHROPLASTY Left     OB History    Gravida Para Term Preterm AB Living   0 0 0 0 0     SAB TAB Ectopic Multiple Live Births   0 0 0           Home Medications    Prior to Admission medications   Medication Sig Start Date End Date Taking? Authorizing Provider  acetaminophen (TYLENOL) 500 MG tablet Take 1,000 mg by mouth every 6 (six) hours as needed for mild pain or moderate pain.    Yes Historical Provider, MD  HYDROmorphone (DILAUDID) 2 MG tablet Take 0.5-1 tablets (1-2 mg total) by mouth every 4 (four) hours as needed for severe pain. 04/29/16  Yes Truitt Merle, MD  lidocaine-prilocaine (EMLA) cream Apply 1 application topically as needed. Patient taking differently: Apply 1 application topically daily as needed (port access).  04/08/16  Yes Owens Shark, NP  ondansetron (ZOFRAN) 8 MG tablet Take 1 tablet (8 mg total) by mouth every 8 (eight) hours as needed for nausea or vomiting. 04/29/16  Yes Truitt Merle, MD  Polyethyl Glycol-Propyl Glycol (SYSTANE OP) Apply 1-2 drops to eye 4 (four) times daily as needed (for dry eye).   Yes Historical Provider, MD  traMADol (ULTRAM) 50 MG tablet Take 1 tablet (50 mg total) by mouth every 8 (eight) hours as needed. Patient taking differently: Take 50 mg by mouth every 8 (eight) hours as needed for moderate pain or severe pain.  04/08/16  Yes Owens Shark, NP  ondansetron (ZOFRAN ODT) 4 MG disintegrating tablet Take 1 tablet (4 mg total) by mouth every 8 (eight) hours as needed for nausea or vomiting. Patient not taking: Reported on 05/03/2016 04/25/16   Carlisle Cater, PA-C    Family History Family History  Problem Relation Age of Onset  . Cancer Sister 85     breast  . Diabetes Sister   . Arthritis Mother   . Arthritis-Osteo Mother   . Diabetes Brother   . Suicidality Brother   . Diabetes Brother   . Breast cancer Sister   . Cancer Maternal Aunt     breast cancer     Social History Social History  Substance Use Topics  . Smoking status: Current Every Day Smoker    Packs/day: 1.00  . Smokeless tobacco: Never Used  . Alcohol use No     Allergies   Aspirin; Codeine; Morphine and related; and Penicillins   Review of Systems Review of Systems  Constitutional: Negative for fever.  Gastrointestinal: Positive for abdominal pain. Negative for vomiting.  All other systems reviewed and are negative.    Physical Exam Updated Vital Signs BP (!) 150/108 (BP  Location: Right Arm)   Pulse 62   Temp 97.9 F (36.6 C) (Oral)   Resp 18   Ht 5\' 4"  (1.626 m)   Wt 55.3 kg   SpO2 97%   BMI 20.94 kg/m  Vitals reviewed Physical Exam Physical Examination: General appearance - alert, well appearing, and in no distress Mental status - alert, oriented to person, place, and time Eyes -no conjunctival injection, no scleral icterus Mouth - mucous membranes moist, pharynx normal without lesions Neck - supple, no significant adenopathy Chest - clear to auscultation, no wheezes, rales or rhonchi, symmetric air entry Heart - normal rate, regular rhythm, normal S1, S2, no murmurs, rubs, clicks or gallops Abdomen - soft, ttp in right upper abdomen, no gaurding or rebound tenderness, nondistended, no masses or organomegaly Neurological - alert, oriented, normal speech Extremities - peripheral pulses normal, no pedal edema, no clubbing or cyanosis Skin - normal coloration and turgor, no rashes  ED Treatments / Results   DIAGNOSTIC STUDIES: Oxygen Saturation is 97% on RA, normal by my interpretation.    COORDINATION OF CARE: 1:59 AM Discussed treatment plan with pt at bedside which includes labs and pain medication and pt agreed to  plan.   Labs (all labs ordered are listed, but only abnormal results are displayed) Labs Reviewed  CBC WITH DIFFERENTIAL/PLATELET - Abnormal; Notable for the following:       Result Value   RBC 3.78 (*)    Hemoglobin 10.8 (*)    HCT 33.3 (*)    All other components within normal limits  COMPREHENSIVE METABOLIC PANEL - Abnormal; Notable for the following:    Glucose, Bld 109 (*)    Calcium 8.3 (*)    Total Protein 6.3 (*)    Albumin 3.1 (*)    Alkaline Phosphatase 314 (*)    All other components within normal limits  LIPASE, BLOOD - Abnormal; Notable for the following:    Lipase 83 (*)    All other components within normal limits    EKG  EKG Interpretation None       Radiology   Procedures Procedures (including critical care time)  Medications Ordered in ED Medications  HYDROmorphone (DILAUDID) injection 1 mg (1 mg Intravenous Given 05/01/16 0256)  heparin lock flush 100 UNIT/ML injection (500 Units  Given 05/01/16 0459)     Initial Impression / Assessment and Plan / ED Course  I have reviewed the triage vital signs and the nursing notes.  Pertinent labs & imaging results that were available during my care of the patient were reviewed by me and considered in my medical decision making (see chart for details).  Clinical Course     Pt with known metastatic cancer and chronic right sided abdominal presenting with ongoing pain similar to prior.  She has been found to have gallstones without evidence of cholecystitis- this was confirmed on CT abd 04/25/16.  Pt has been followed by oncology for this and has PET scan scheduled for later in the week to evaluate for progression of cancer as well as an appointment with surgery has been arranged- oncology wanted to do PET scan first to use that information to help determine surgery's plan.  Pt feels some improvement after dilaudid in the ED- labs are reassuring including LFTs, lipase not signficantly elevated- had recent CT scan- no  indication to repeat tonight.  I have discussed all results and plan with patient and daughter at the bedside.  I have advised patient take po dialudid on a  regularly scheduled basis as this may help to control pain better.  Advised stool softener as well.  Pt agreeable with plan to followup for PETscan this week and outpatient f/u with surgery. Discharged with strict return precautions.  Pt agreeable with plan.  Final Clinical Impressions(s) / ED Diagnoses   Final diagnoses:  Abdominal pain, unspecified abdominal location  Metastatic cancer ([place])  Gallstones    New Prescriptions Discharge Medication List as of 05/01/2016  4:25 AM      I personally performed the services described in this documentation, which was scribed in my presence. The recorded information has been reviewed and is accurate.       Joann Beers, MD 05/05/16 [phone]

## 2016-05-01 NOTE — ED Triage Notes (Signed)
Pt reports abd pain that started several days ago and has been seen in ED for same several days ago. Pt reports taking Dilaudid tablet at appx 2100. Pt is chemo pt with last treatment 3 weeks ago.

## 2016-05-03 ENCOUNTER — Inpatient Hospital Stay (HOSPITAL_COMMUNITY): Payer: Medicare Other

## 2016-05-03 ENCOUNTER — Emergency Department (HOSPITAL_COMMUNITY): Payer: Medicare Other

## 2016-05-03 ENCOUNTER — Encounter (HOSPITAL_COMMUNITY): Payer: Self-pay

## 2016-05-03 ENCOUNTER — Inpatient Hospital Stay (HOSPITAL_COMMUNITY)
Admission: EM | Admit: 2016-05-03 | Discharge: 2016-05-08 | DRG: 445 | Disposition: A | Discharge status: Home Health | Payer: Medicare Other | Attending: Internal Medicine | Admitting: Internal Medicine

## 2016-05-03 DIAGNOSIS — R1010 Upper abdominal pain, unspecified: Secondary | ICD-10-CM | POA: Diagnosis not present

## 2016-05-03 DIAGNOSIS — F17210 Nicotine dependence, cigarettes, uncomplicated: Secondary | ICD-10-CM | POA: Diagnosis present

## 2016-05-03 DIAGNOSIS — R109 Unspecified abdominal pain: Secondary | ICD-10-CM | POA: Diagnosis not present

## 2016-05-03 DIAGNOSIS — E440 Moderate protein-calorie malnutrition: Secondary | ICD-10-CM | POA: Diagnosis present

## 2016-05-03 DIAGNOSIS — R1084 Generalized abdominal pain: Secondary | ICD-10-CM | POA: Diagnosis not present

## 2016-05-03 DIAGNOSIS — C187 Malignant neoplasm of sigmoid colon: Secondary | ICD-10-CM | POA: Diagnosis present

## 2016-05-03 DIAGNOSIS — M069 Rheumatoid arthritis, unspecified: Secondary | ICD-10-CM | POA: Diagnosis present

## 2016-05-03 DIAGNOSIS — R740 Nonspecific elevation of levels of transaminase and lactic acid dehydrogenase [LDH]: Secondary | ICD-10-CM | POA: Diagnosis present

## 2016-05-03 DIAGNOSIS — Z886 Allergy status to analgesic agent status: Secondary | ICD-10-CM

## 2016-05-03 DIAGNOSIS — Z79899 Other long term (current) drug therapy: Secondary | ICD-10-CM

## 2016-05-03 DIAGNOSIS — Z880 Allergy status to penicillin: Secondary | ICD-10-CM

## 2016-05-03 DIAGNOSIS — R599 Enlarged lymph nodes, unspecified: Secondary | ICD-10-CM | POA: Diagnosis present

## 2016-05-03 DIAGNOSIS — R590 Localized enlarged lymph nodes: Secondary | ICD-10-CM

## 2016-05-03 DIAGNOSIS — E860 Dehydration: Secondary | ICD-10-CM | POA: Diagnosis not present

## 2016-05-03 DIAGNOSIS — C189 Malignant neoplasm of colon, unspecified: Secondary | ICD-10-CM | POA: Diagnosis not present

## 2016-05-03 DIAGNOSIS — K219 Gastro-esophageal reflux disease without esophagitis: Secondary | ICD-10-CM | POA: Diagnosis present

## 2016-05-03 DIAGNOSIS — K8020 Calculus of gallbladder without cholecystitis without obstruction: Secondary | ICD-10-CM | POA: Diagnosis not present

## 2016-05-03 DIAGNOSIS — R945 Abnormal results of liver function studies: Secondary | ICD-10-CM

## 2016-05-03 DIAGNOSIS — R1011 Right upper quadrant pain: Secondary | ICD-10-CM

## 2016-05-03 DIAGNOSIS — K811 Chronic cholecystitis: Secondary | ICD-10-CM

## 2016-05-03 DIAGNOSIS — K812 Acute cholecystitis with chronic cholecystitis: Secondary | ICD-10-CM | POA: Diagnosis present

## 2016-05-03 DIAGNOSIS — K810 Acute cholecystitis: Secondary | ICD-10-CM | POA: Diagnosis not present

## 2016-05-03 DIAGNOSIS — Z7189 Other specified counseling: Secondary | ICD-10-CM | POA: Diagnosis not present

## 2016-05-03 DIAGNOSIS — K838 Other specified diseases of biliary tract: Secondary | ICD-10-CM | POA: Diagnosis present

## 2016-05-03 DIAGNOSIS — C786 Secondary malignant neoplasm of retroperitoneum and peritoneum: Secondary | ICD-10-CM | POA: Diagnosis present

## 2016-05-03 DIAGNOSIS — Z885 Allergy status to narcotic agent status: Secondary | ICD-10-CM

## 2016-05-03 DIAGNOSIS — C787 Secondary malignant neoplasm of liver and intrahepatic bile duct: Secondary | ICD-10-CM | POA: Diagnosis present

## 2016-05-03 DIAGNOSIS — R7989 Other specified abnormal findings of blood chemistry: Secondary | ICD-10-CM | POA: Diagnosis not present

## 2016-05-03 DIAGNOSIS — R935 Abnormal findings on diagnostic imaging of other abdominal regions, including retroperitoneum: Secondary | ICD-10-CM | POA: Diagnosis not present

## 2016-05-03 DIAGNOSIS — K5900 Constipation, unspecified: Secondary | ICD-10-CM | POA: Diagnosis present

## 2016-05-03 DIAGNOSIS — K8063 Calculus of gallbladder and bile duct with acute cholecystitis with obstruction: Secondary | ICD-10-CM | POA: Diagnosis not present

## 2016-05-03 DIAGNOSIS — K8061 Calculus of gallbladder and bile duct with cholecystitis, unspecified, with obstruction: Secondary | ICD-10-CM

## 2016-05-03 DIAGNOSIS — K8065 Calculus of gallbladder and bile duct with chronic cholecystitis with obstruction: Secondary | ICD-10-CM | POA: Diagnosis not present

## 2016-05-03 DIAGNOSIS — C186 Malignant neoplasm of descending colon: Secondary | ICD-10-CM | POA: Diagnosis not present

## 2016-05-03 DIAGNOSIS — Z96653 Presence of artificial knee joint, bilateral: Secondary | ICD-10-CM | POA: Diagnosis present

## 2016-05-03 DIAGNOSIS — Z9221 Personal history of antineoplastic chemotherapy: Secondary | ICD-10-CM | POA: Diagnosis not present

## 2016-05-03 DIAGNOSIS — R627 Adult failure to thrive: Secondary | ICD-10-CM | POA: Diagnosis present

## 2016-05-03 DIAGNOSIS — Z515 Encounter for palliative care: Secondary | ICD-10-CM | POA: Diagnosis not present

## 2016-05-03 DIAGNOSIS — R1013 Epigastric pain: Secondary | ICD-10-CM | POA: Diagnosis not present

## 2016-05-03 LAB — COMPREHENSIVE METABOLIC PANEL
ALBUMIN: 3.3 g/dL — AB (ref 3.5–5.0)
ALK PHOS: 550 U/L — AB (ref 38–126)
ALT: 143 U/L — ABNORMAL HIGH (ref 14–54)
ANION GAP: 7 (ref 5–15)
AST: 431 U/L — ABNORMAL HIGH (ref 15–41)
BUN: 21 mg/dL — ABNORMAL HIGH (ref 6–20)
CALCIUM: 8.6 mg/dL — AB (ref 8.9–10.3)
CHLORIDE: 102 mmol/L (ref 101–111)
CO2: 27 mmol/L (ref 22–32)
Creatinine, Ser: 0.95 mg/dL (ref 0.44–1.00)
GFR calc Af Amer: 59 mL/min — ABNORMAL LOW (ref >60)
GFR calc non Af Amer: 51 mL/min — ABNORMAL LOW (ref >60)
GLUCOSE: 109 mg/dL — AB (ref 65–99)
Potassium: 4.3 mmol/L (ref 3.5–5.1)
SODIUM: 136 mmol/L (ref 135–145)
Total Bilirubin: 0.8 mg/dL (ref 0.3–1.2)
Total Protein: 6.5 g/dL (ref 6.5–8.1)

## 2016-05-03 LAB — CBC WITH DIFFERENTIAL/PLATELET
BASOS PCT: 1 %
Basophils Absolute: 0 10*3/uL (ref 0.0–0.1)
EOS ABS: 0.2 10*3/uL (ref 0.0–0.7)
EOS PCT: 3 %
HCT: 34.9 % — ABNORMAL LOW (ref 36.0–46.0)
HEMOGLOBIN: 11.2 g/dL — AB (ref 12.0–15.0)
Lymphocytes Relative: 14 %
Lymphs Abs: 1.2 10*3/uL (ref 0.7–4.0)
MCH: 27.7 pg (ref 26.0–34.0)
MCHC: 32.1 g/dL (ref 30.0–36.0)
MCV: 86.4 fL (ref 78.0–100.0)
MONOS PCT: 8 %
Monocytes Absolute: 0.7 10*3/uL (ref 0.1–1.0)
NEUTROS PCT: 74 %
Neutro Abs: 6 10*3/uL (ref 1.7–7.7)
PLATELETS: 284 10*3/uL (ref 150–400)
RBC: 4.04 MIL/uL (ref 3.87–5.11)
RDW: 14 % (ref 11.5–15.5)
WBC: 8.2 10*3/uL (ref 4.0–10.5)

## 2016-05-03 LAB — LIPASE, BLOOD: Lipase: 83 U/L — ABNORMAL HIGH (ref 11–51)

## 2016-05-03 MED ORDER — DEXTROSE-NACL 5-0.9 % IV SOLN
INTRAVENOUS | Status: DC
Start: 2016-05-03 — End: 2016-05-08
  Administered 2016-05-03 – 2016-05-08 (×9): via INTRAVENOUS

## 2016-05-03 MED ORDER — TECHNETIUM TC 99M MEBROFENIN IV KIT
5.0500 | PACK | Freq: Once | INTRAVENOUS | Status: AC | PRN
  Administered 2016-05-03: 5.05 via INTRAVENOUS

## 2016-05-03 MED ORDER — HYDROMORPHONE HCL 1 MG/ML IJ SOLN
1.0000 mg | INTRAMUSCULAR | Status: DC | PRN
  Administered 2016-05-04 – 2016-05-07 (×12): 1 mg via INTRAVENOUS
  Filled 2016-05-03 (×12): qty 1

## 2016-05-03 MED ORDER — ONDANSETRON HCL 4 MG/2ML IJ SOLN
4.0000 mg | Freq: Three times a day (TID) | INTRAMUSCULAR | Status: DC | PRN
  Administered 2016-05-04 – 2016-05-05 (×2): 4 mg via INTRAVENOUS
  Filled 2016-05-03 (×2): qty 2

## 2016-05-03 MED ORDER — HYDROMORPHONE HCL 1 MG/ML IJ SOLN
1.0000 mg | Freq: Once | INTRAMUSCULAR | Status: AC
  Administered 2016-05-03: 1 mg via INTRAVENOUS
  Filled 2016-05-03: qty 1

## 2016-05-03 MED ORDER — ONDANSETRON HCL 4 MG/2ML IJ SOLN
4.0000 mg | Freq: Once | INTRAMUSCULAR | Status: AC
  Administered 2016-05-03: 4 mg via INTRAVENOUS
  Filled 2016-05-03: qty 2

## 2016-05-03 MED ORDER — HEPARIN SODIUM (PORCINE) 5000 UNIT/ML IJ SOLN
5000.0000 [IU] | Freq: Three times a day (TID) | INTRAMUSCULAR | Status: DC
  Administered 2016-05-03 – 2016-05-08 (×14): 5000 [IU] via SUBCUTANEOUS
  Filled 2016-05-03 (×14): qty 1

## 2016-05-03 MED ORDER — SODIUM CHLORIDE 0.9 % IV SOLN
INTRAVENOUS | Status: DC
  Administered 2016-05-03: 13:00:00 via INTRAVENOUS

## 2016-05-03 MED ORDER — LORAZEPAM 2 MG/ML IJ SOLN
1.0000 mg | Freq: Once | INTRAMUSCULAR | Status: AC
  Administered 2016-05-03: 1 mg via INTRAVENOUS
  Filled 2016-05-03: qty 1

## 2016-05-03 MED ORDER — GADOBENATE DIMEGLUMINE 529 MG/ML IV SOLN
11.0000 mL | Freq: Once | INTRAVENOUS | Status: AC | PRN
  Administered 2016-05-03: 11 mL via INTRAVENOUS

## 2016-05-03 NOTE — ED Notes (Signed)
Report given to 5E 

## 2016-05-03 NOTE — ED Notes (Addendum)
MD at bedside. 

## 2016-05-03 NOTE — ED Notes (Signed)
Pt transported to MRI 

## 2016-05-03 NOTE — ED Notes (Signed)
RN @ bedside accessing port. 

## 2016-05-03 NOTE — ED Provider Notes (Signed)
[name] DEPT Provider Note   CSN: [id number] Arrival date & time: 05/03/16  1010     History   Chief Complaint Chief Complaint  Patient presents with  . Abdominal Pain    HPI Joann Herrera is a 83 y.o. female.  83 year old female with history of metastatic cancer who presents with recurring right upper quadrant abdominal pain 2 years. Was seen at [facility] for similar symptoms. Has been scheduled for an outpatient PET scan as well as possible surgical referral for possible gallbladder disease. Has been prescribed hydromorphone which she has been taking but has not helped her symptoms. Denies any fever or chills. No vomiting or diarrhea. Pain is been persistent and worse with eating.      Past Medical History:  Diagnosis Date  . Cancer (HCC)    squamous cell  . Colon carcinoma ([place])    dx. left Colon cancer,evidence of metastasis  . Complication of anesthesia    "morphine caused severe shakes"  . GERD (gastroesophageal reflux disease)   . Macular degeneration   . Pneumonia 04/2012  . PONV (postoperative nausea and vomiting)   . Rheumatoid Arthritis     Patient Active Problem List   Diagnosis Date Noted  . Dehydration 09/08/2015  . Constipation 09/08/2015  . Atypical chest pain   . Metastatic colon cancer in female [facility])   . Cancer of left colon ([place]) 05/18/2015  . Abdominal aortic aneurysm ([place]) 04/21/2015  . Cholelithiasis 03/17/2015  . Dilated cbd, acquired 03/17/2015  . Gastritis 02/25/2015  . Aortic arch atherosclerosis ([place]) 02/25/2015  . Numbness 02/25/2015  . Nicotine dependence 02/25/2015  . Abdominal pain, epigastric 01/06/2015  . Hypertriglyceridemia 12/11/2014  . Vertigo 11/16/2014  . CVA (cerebral infarction) 11/16/2014  . Arthritis 11/16/2014  . Pre-syncope 11/16/2014  . Hypocalcemia 11/16/2014  . Squamous cell carcinoma 07/30/2014  . Insomnia 03/17/2014  . Fatigue 09/13/2013  . Depression 09/13/2013  . GERD (gastroesophageal  reflux disease) 09/02/2013  . Unsteady gait 09/02/2013  . Dry eye 06/27/2013  . 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 Horseshoe tear of retina without detachment 03/31/2011  . Age-related macular degeneration, dry 03/31/2011    Past Surgical History:  Procedure Laterality Date  . CATARACT EXTRACTION, BILATERAL    . Cataracts    . COLONOSCOPY WITH PROPOFOL N/A 04/30/2015   Procedure: COLONOSCOPY WITH PROPOFOL;  Surgeon: Milus Banister, MD;  Location: [facility];  Service: Endoscopy;  Laterality: N/A;  . ESOPHAGOGASTRODUODENOSCOPY N/A 01/22/2015   Procedure: ESOPHAGOGASTRODUODENOSCOPY (EGD);  Surgeon: Milus Banister, MD;  Location: [facility];  Service: Endoscopy;  Laterality: N/A;  . EYE SURGERY     bilateral cataracts  . HAND SURGERY     Pt states she had both hands operated on for arthritis.  Marland Kitchen HAND SURGERY    . HEMORRHOID SURGERY    . IR GENERIC HISTORICAL  02/12/2016   IR US GUIDE VASC ACCESS RIGHT 02/12/2016 Corrie Mckusick, DO [facility]  . IR GENERIC HISTORICAL  02/12/2016   IR FLUORO GUIDE PORT INSERTION RIGHT 02/12/2016 Corrie Mckusick, DO [facility]  . JOINT REPLACEMENT    . MINOR HEMORRHOIDECTOMY    . SHOULDER ARTHROSCOPY Left   . SHOULDER SURGERY Left    arthroscopy  . TONSILLECTOMY    . TOTAL KNEE ARTHROPLASTY     x 2  . TOTAL KNEE ARTHROPLASTY Right   . TOTAL KNEE ARTHROPLASTY Left     OB History    Gravida Para Term Preterm AB Living  0 0 0 0 0     SAB TAB Ectopic Multiple Live Births   0 0 0           Home Medications    Prior to Admission medications   Medication Sig Start Date End Date Taking? Authorizing Provider  acetaminophen (TYLENOL) 500 MG tablet Take 1,000 mg by mouth every 6 (six) hours as needed for mild pain or moderate pain.    Yes Historical Provider, MD  HYDROmorphone (DILAUDID) 2 MG tablet Take 0.5-1 tablets (1-2 mg total) by mouth every 4 (four) hours as needed for severe pain. 04/29/16  Yes Truitt Merle, MD    lidocaine-prilocaine (EMLA) cream Apply 1 application topically as needed. Patient taking differently: Apply 1 application topically daily as needed (port access).  04/08/16  Yes Owens Shark, NP  ondansetron (ZOFRAN) 8 MG tablet Take 1 tablet (8 mg total) by mouth every 8 (eight) hours as needed for nausea or vomiting. 04/29/16  Yes Truitt Merle, MD  Polyethyl Glycol-Propyl Glycol (SYSTANE OP) Apply 1-2 drops to eye 4 (four) times daily as needed (for dry eye).   Yes Historical Provider, MD  traMADol (ULTRAM) 50 MG tablet Take 1 tablet (50 mg total) by mouth every 8 (eight) hours as needed. Patient taking differently: Take 50 mg by mouth every 8 (eight) hours as needed for moderate pain or severe pain.  04/08/16  Yes Owens Shark, NP  ondansetron (ZOFRAN ODT) 4 MG disintegrating tablet Take 1 tablet (4 mg total) by mouth every 8 (eight) hours as needed for nausea or vomiting. Patient not taking: Reported on 05/03/2016 04/25/16   Carlisle Cater, PA-C  oxyCODONE-acetaminophen (PERCOCET/ROXICET) 5-325 MG tablet Take 0.5-1 tablets by mouth every 6 (six) hours as needed for severe pain. Patient not taking: Reported on 05/03/2016 04/25/16   Carlisle Cater, PA-C    Family History Family History  Problem Relation Age of Onset  . Cancer Sister 38    breast  . Diabetes Sister   . Arthritis Mother   . Arthritis-Osteo Mother   . Diabetes Brother   . Suicidality Brother   . Diabetes Brother   . Breast cancer Sister   . Cancer Maternal Aunt     breast cancer     Social History Social History  Substance Use Topics  . Smoking status: Current Every Day Smoker    Packs/day: 1.00  . Smokeless tobacco: Never Used  . Alcohol use No     Allergies   Aspirin; Codeine; Morphine and related; and Penicillins   Review of Systems Review of Systems  All other systems reviewed and are negative.    Physical Exam Updated Vital Signs BP 121/56 (BP Location: Right Arm)   Pulse 72   Temp 97.9 F (36.6 C)  (Oral)   Resp 24   Ht 5\' 4"  (1.626 m)   Wt 55.3 kg   SpO2 96%   BMI 20.94 kg/m   Physical Exam  Constitutional: She is oriented to person, place, and time. She appears well-developed and well-nourished.  Non-toxic appearance. No distress.  HENT:  Head: Normocephalic and atraumatic.  Eyes: Conjunctivae, EOM and lids are normal. Pupils are equal, round, and reactive to light.  Neck: Normal range of motion. Neck supple. No tracheal deviation present. No thyroid mass present.  Cardiovascular: Normal rate, regular rhythm and normal heart sounds.  Exam reveals no gallop.   No murmur heard. Pulmonary/Chest: Effort normal and breath sounds normal. No stridor. No respiratory distress. She has no  decreased breath sounds. She has no wheezes. She has no rhonchi. She has no rales.  Abdominal: Soft. Normal appearance and bowel sounds are normal. She exhibits no distension. There is tenderness in the right upper quadrant and epigastric area. There is guarding.    Musculoskeletal: Normal range of motion. She exhibits no edema or tenderness.  Neurological: She is alert and oriented to person, place, and time. She has normal strength. No cranial nerve deficit or sensory deficit. GCS eye subscore is 4. GCS verbal subscore is 5. GCS motor subscore is 6.  Skin: Skin is warm and dry. No abrasion and no rash noted.  Psychiatric: She has a normal mood and affect. Her speech is normal and behavior is normal.  Nursing note and vitals reviewed.    ED Treatments / Results  Labs (all labs ordered are listed, but only abnormal results are displayed) Labs Reviewed  CBC WITH DIFFERENTIAL/PLATELET  COMPREHENSIVE METABOLIC PANEL  LIPASE, BLOOD    EKG  EKG Interpretation None       Radiology No results found.  Procedures Procedures (including critical care time)  Medications Ordered in ED Medications  0.9 %  sodium chloride infusion (not administered)     Initial Impression / Assessment and Plan  / ED Course  I have reviewed the triage vital signs and the nursing notes.  Pertinent labs & imaging results that were available during my care of the patient were reviewed by me and considered in my medical decision making (see chart for details).  Clinical Course     Patient medicated for pain here and remains uncomfortable. Patient's LFTs have increased. Spoke with general surgery who will see the patient consultation. Spoke with gastroenterology will also see the patient. Recommends hospital admission  Final Clinical Impressions(s) / ED Diagnoses   Final diagnoses:  None    New Prescriptions New Prescriptions   No medications on file     Lacretia Leigh, MD 05/03/16 1559

## 2016-05-03 NOTE — Consult Note (Signed)
[facility] Consult: 3:50 PM 05/03/2016  LOS: 0 days    Referring Provider: Dr Zenia Resides  Primary Care Physician:  [name], MD Primary Gastroenterologist:  Dr. Ardis Hughs     Reason for Consultation:  Abdominal pain.    HPI: Joann Herrera is a 83 y.o. female.  PMH Rheumatoid arthritis.  Colon cancer.  Adenopathy at porta hepatis.    Periportal mass and abnormal colon on CT in 04/2015. CEA normal, CA 19-9 slightly elevated  12/2014 EGD for N/V.  There was mild, pan gastric atrophic changes (atrophic gastritis). This was biopsied and sent to pathology.  There was a 2cm hiatal hernia.  The examination was otherwise normal.  H Pylori +: treated.   02/2015 Abdominal ultrasound  showed gallbladder stone, and a 3.3 cm hypoechoic mass in the portal hepatitis 04/30/15 MRI/MRCP.   2 cm short axis node in the porta hepatis and additional 1.4 cm port cable node, with central necrosis 04/30/15 Colonoscopy.  3 polyps removed, 2 submitted to path.  Malignant looking mass in prox sigmoid.  Large polyp too big to remove in sigmoid.   Smaller sessile polyps in sigmoid.  Left colon tics.  Path:  Poorly differentiated adeno CA, tubular adenooma Case discussed at cancer conference 05/06/15: surgery/oncology recommended for stage 4 colon cancer. No show for 05/2015 EUS.    She did not want surgery at her age.  Dr Burr Medico has followed, treated pt. "Keytruda 228m iv every 3 weeks started on 07/08/2015, dose reduced to 212mkg from cycle 2 on 08/31/15 due to severe reaction after first infusion".  Patient has had issues with right abdominal pain for perhaps a year or more. There is always been questioned as to whether this is coming from her cancer, adenopathy at porta hepatitis or from gallstones. She was referred to general surgery for  evaluation but did not keep the appointment.  Patient hasn't had tumor markers since CEA level of 5.3 on 12/04/15. Seen in ED 4 times in the last 10 days with nausea, pain not controlled with Tylenol, Tramadol, po Dilaudid.  The pain is somewhat controlled with heating pad. Pain in Right abdomen, worse after meals, however she continues to eat okay. Pain penetrates into her back..   CT showed multiple gallstones, stable dilation of CBD at 1659mcolon cancer, lung consolidation, port hepatis adenopathy.  Labs with elevated alk phos, Lipase, AST.   Pain relieved with IV narcotic and sent home.  Had been set up to have repeat PET scan and surgical referral for evaluation of GB dz causing sxs.    Today the alk phos is further elevated and AST/ALT jumped to 431/143.  T bili not elevated.  Lipase stable at 83.   Ultrasound: Gallstones, stable 42m83mD. No evidence for cholecystitis.  Patient's complaint is only of the pain in the right upper and lower quadrants. It penetrates to the back. She is no longer having nausea or vomiting. Although she hasn't eaten today, her son says that she's been eating okay at home the past several days. Bowel movements  are not daily. She has seen blood in her bowel movements.  Past Medical History:  Diagnosis Date  . Cancer (HCC)    squamous cell  . Colon carcinoma ([place])    dx. left Colon cancer,evidence of metastasis  . Complication of anesthesia    "morphine caused severe shakes"  . GERD (gastroesophageal reflux disease)   . Macular degeneration   . Pneumonia 04/2012  . PONV (postoperative nausea and vomiting)   . Rheumatoid Arthritis     Past Surgical History:  Procedure Laterality Date  . CATARACT EXTRACTION, BILATERAL    . Cataracts    . COLONOSCOPY WITH PROPOFOL N/A 04/30/2015   Procedure: COLONOSCOPY WITH PROPOFOL;  Surgeon: Milus Banister, MD;  [facility];  Service: Endoscopy;  Laterality: N/A;  . ESOPHAGOGASTRODUODENOSCOPY N/A 01/22/2015    Procedure: ESOPHAGOGASTRODUODENOSCOPY (EGD);  Surgeon: Milus Banister, MD;  Location: [facility];  Service: Endoscopy;  Laterality: N/A;  . EYE SURGERY     bilateral cataracts  . HAND SURGERY     Pt states she had both hands operated on for arthritis.  Marland Kitchen HAND SURGERY    . HEMORRHOID SURGERY    . IR GENERIC HISTORICAL  02/12/2016   IR US GUIDE VASC ACCESS RIGHT 02/12/2016 Corrie Mckusick, DO [facility]  . IR GENERIC HISTORICAL  02/12/2016   IR FLUORO GUIDE PORT INSERTION RIGHT 02/12/2016 Corrie Mckusick, DO [facility]  . JOINT REPLACEMENT    . MINOR HEMORRHOIDECTOMY    . SHOULDER ARTHROSCOPY Left   . SHOULDER SURGERY Left    arthroscopy  . TONSILLECTOMY    . TOTAL KNEE ARTHROPLASTY     x 2  . TOTAL KNEE ARTHROPLASTY Right   . TOTAL KNEE ARTHROPLASTY Left     Prior to Admission medications   Medication Sig Start Date End Date Taking? Authorizing Provider  acetaminophen (TYLENOL) 500 MG tablet Take 1,000 mg by mouth every 6 (six) hours as needed for mild pain or moderate pain.    Yes Historical Provider, MD  HYDROmorphone (DILAUDID) 2 MG tablet Take 0.5-1 tablets (1-2 mg total) by mouth every 4 (four) hours as needed for severe pain. 04/29/16  Yes Truitt Merle, MD  lidocaine-prilocaine (EMLA) cream Apply 1 application topically as needed. Patient taking differently: Apply 1 application topically daily as needed (port access).  04/08/16  Yes Owens Shark, NP  ondansetron (ZOFRAN) 8 MG tablet Take 1 tablet (8 mg total) by mouth every 8 (eight) hours as needed for nausea or vomiting. 04/29/16  Yes Truitt Merle, MD  Polyethyl Glycol-Propyl Glycol (SYSTANE OP) Apply 1-2 drops to eye 4 (four) times daily as needed (for dry eye).   Yes Historical Provider, MD  traMADol (ULTRAM) 50 MG tablet Take 1 tablet (50 mg total) by mouth every 8 (eight) hours as needed. Patient taking differently: Take 50 mg by mouth every 8 (eight) hours as needed for moderate pain or severe pain.  04/08/16  Yes Owens Shark,  NP  ondansetron (ZOFRAN ODT) 4 MG disintegrating tablet Take 1 tablet (4 mg total) by mouth every 8 (eight) hours as needed for nausea or vomiting. Patient not taking: Reported on 05/03/2016 04/25/16   Carlisle Cater, PA-C  oxyCODONE-acetaminophen (PERCOCET/ROXICET) 5-325 MG tablet Take 0.5-1 tablets by mouth every 6 (six) hours as needed for severe pain. Patient not taking: Reported on 05/03/2016 04/25/16   Carlisle Cater, PA-C    Scheduled Meds:  Infusions: . sodium chloride 20 mL/hr at 05/03/16 1238   PRN Meds:  Allergies as of 05/03/2016 - Review Complete 05/03/2016  Allergen Reaction Noted  . Aspirin Other (See Comments) 05/03/2012  . Codeine Nausea And Vomiting 05/03/2012  . Morphine and related Nausea And Vomiting 05/03/2012  . Penicillins Hives and Swelling 11/15/2014    Family History  Problem Relation Age of Onset  . Cancer Sister 6    breast  . Diabetes Sister   . Arthritis Mother   . Arthritis-Osteo Mother   . Diabetes Brother   . Suicidality Brother   . Diabetes Brother   . Breast cancer Sister   . Cancer Maternal Aunt     breast cancer     Social History   Social History  . Marital status: Widowed    Spouse name: N/A  . Number of children: 3  . Years of education: N/A   Occupational History  . Retired    Social History Main Topics  . Smoking status: Current Every Day Smoker    Packs/day: 1.00  . Smokeless tobacco: Never Used  . Alcohol use No  . Drug use: No  . Sexual activity: Not on file   Other Topics Concern  . Not on file   Social History Narrative   ** Merged History Encounter **       Daughter in [place] Ivin Booty [place]) [phone]   Son in [place]   Son in [place]   Retired- Medical sales representative business, worked at [employer] for 28 years   Enjoys dancing and going to [place]   Completed 11th grade   Very poor vision due to macular degeneration          REVIEW OF SYSTEMS: Constitutional:  Some  weakness. ENT:  No nose bleeds Pulm:  Cough and non-purulent sputum. CV:  No palpitations, no LE edema.  GU:  No hematuria, no frequency GI:  Per HPI Heme:  No unusual bleeding or bruising other than blood with stool.   Transfusions:  Does not recall previous transfusions. Neuro:  No headaches, no peripheral tingling or numbness Derm:  No itching, no rash or sores.  Endocrine:  No sweats or chills.  No polyuria or dysuria Immunization:  Did not inquire as to recent immunizations. Travel:  None beyond local counties in last few months.    PHYSICAL EXAM: Vital signs in last 24 hours: Vitals:   05/03/16 1333 05/03/16 1539  BP: (!) 107/49 102/56  Pulse: 76 68  Resp: 14 16  Temp:     Wt Readings from Last 3 Encounters:  05/03/16 55.3 kg (122 lb)  05/01/16 55.3 kg (122 lb)  04/29/16 57.4 kg (126 lb 9.6 oz)    General: Frail, chronically ill and uncomfortable appearing WF. She does not look 89.  Head:  No facial asymmetry or swelling  Eyes:  No scleral icterus or conjunctival pallor Ears:  Somewhat hard of hearing.  Nose:  No discharge Mouth:  Dry mucous membranes. Many absent teeth. She is unable to open her mouth wide enough for me to see her pharynx. Neck:  No mass, no JVD, no TMG. Lungs:  Diminished but clear. No labored breathing. Heart:  RRR. No MRG. Abdomen:  Soft. Not distended. No mass. No HSM. Unable to elicit tenderness to moderate palpation in all 4 quadrants. Bowel sounds active..  Skin in the abdomen is somewhat mottled consistent with her recent use of a heating pad for pain relief. Rectal:  Deferred   Musc/Skeltl:  Noted joint erythema. Kyphosis. No contracture deformities  or swelling. Extremities:  No CCE. Limbs are thin.  Neurologic:  Patient is alert. She is oriented 3. Moves all 4 limbs, limbs strength was not tested. No tremor. Skin:  No suspicious rashes or sores. Tattoos:  None seen Nodes:  No cervical adenopathy   Psych:  Affect flat.  Intake/Output  from previous day: No intake/output data recorded. Intake/Output this shift: No intake/output data recorded.  LAB RESULTS:  Recent Labs  05/01/16 0255 05/03/16 1220  WBC 5.9 8.2  HGB 10.8* 11.2*  HCT 33.3* 34.9*  PLT 292 284   BMET Lab Results  Component Value Date   NA 136 05/03/2016   NA 136 05/01/2016   NA 138 04/24/2016   K 4.3 05/03/2016   K 4.3 05/01/2016   K 4.1 04/24/2016   CL 102 05/03/2016   CL 104 05/01/2016   CL 108 04/24/2016   CO2 27 05/03/2016   CO2 23 05/01/2016   CO2 23 04/24/2016   GLUCOSE 109 (H) 05/03/2016   GLUCOSE 109 (H) 05/01/2016   GLUCOSE 117 (H) 04/24/2016   BUN 21 (H) 05/03/2016   BUN 18 05/01/2016   BUN 19 04/24/2016   CREATININE 0.95 05/03/2016   CREATININE 0.68 05/01/2016   CREATININE 1.00 04/24/2016   CALCIUM 8.6 (L) 05/03/2016   CALCIUM 8.3 (L) 05/01/2016   CALCIUM 8.7 (L) 04/24/2016   LFT  Recent Labs  05/01/16 0255 05/03/16 1220  PROT 6.3* 6.5  ALBUMIN 3.1* 3.3*  AST 26 431*  ALT 43 143*  ALKPHOS 314* 550*  BILITOT 0.3 0.8   PT/INR Lab Results  Component Value Date   INR 0.94 02/12/2016   Hepatitis Panel No results for input(s): HEPBSAG, HCVAB, HEPAIGM, HEPBIGM in the last 72 hours. C-Diff No components found for: CDIFF Lipase     Component Value Date/Time   LIPASE 83 (H) 05/03/2016 1220    Drugs of Abuse  No results found for: LABOPIA, COCAINSCRNUR, LABBENZ, AMPHETMU, THCU, LABBARB   RADIOLOGY STUDIES: US Abdomen Complete  Result Date: 05/03/2016 CLINICAL DATA:  Abdominal pain EXAM: ABDOMEN ULTRASOUND COMPLETE COMPARISON:  04/25/2016 FINDINGS: Gallbladder: Multiple gallstones are again identified stable from the prior CT examination. No gallbladder wall thickening or pericholecystic fluid is noted. Common bile duct: Diameter: Dilated at 11 mm. This is similar to that seen on prior CT examination. Liver: No focal lesion identified. Within normal limits in parenchymal echogenicity. IVC: No abnormality  visualized. Pancreas: Visualized portion unremarkable. Spleen: Size and appearance within normal limits. Right Kidney: Length: 9.4 cm. Echogenicity within normal limits. No mass or hydronephrosis visualized. Left Kidney: Length: 9.3 cm. Echogenicity within normal limits. No mass or hydronephrosis visualized. Abdominal aorta: No aneurysm visualized. Other findings: None. IMPRESSION: Cholelithiasis. Common bile duct dilatation stable from the previous exam. Correlation with bilirubin level is recommended. Electronically Signed   By: Inez Catalina M.D.   On: 05/03/2016 15:06     IMPRESSION:   *  Worsening right-sided abdominal pain  and now markedly abnormal LFTs.  Adenopathy at porta hepatis may be causing ductal obstruction and symptoms. Does not appear to have stones/sludge in the bile duct.  Lipase is mildly elevated in the 80s, may have an element of biliary pancreatitis.  *  Stage IV colon cancer. Dr. Annamaria Boots is treating this with Keytruda.  CT from a few days ago shows nonobstructing focal wall thickening in the distal sigmoid colon consistent with her known cancer. She does have blood with her stools.    PLAN:     *  Case discussed with Dr. Havery Moros. Will order MRCP.  Hopefully she can lay still enough and cooperate for an effective study.  Azucena Freed  05/03/2016, 3:50 PM Pager: [phone]

## 2016-05-03 NOTE — Consult Note (Signed)
[facility] Surgery Consult Note  Joann Herrera 1924-01-06  [id number].    Requesting MD: Zenia Resides, MD Chief Complaint/Reason for Consult: cholelithiasis, abnormal LFT's  HPI:  Joann Herrera is a 83 year-old female with a history of GERD, cholelithiasis, and metastatic colon cancer who presented to the ED 05/02/16 with worsening abdominal pain. 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 She is followed by [name] and is undergoing treatment with [facility]. Patient has reported to the ED 3 times this week with abdominal pain and discharged home after pain controlled in ED. Today she had persistent, right-sided abdominal pain described as intermittent and sharp. Pain is non-radiating. Associated with nausea and no vomiting. Last BM was this morning and non-bloody.   Patient has seen Dr. Brantley Stage in the [place] office for surgical consultation regarding sigmoid cancer.  ED workup significant for below LFT's, RUQ U/S significant for cholelithiasis without signs of cholecystitis, CT scan significant for cholelithiasis and common bile duct dilatation stable from previous CT. Lipase 83. No leukocytosis. Afebrile.  Hepatic Function Latest Ref Rng & Units 05/03/2016 05/01/2016 04/24/2016  Total Protein 6.5 - 8.1 g/dL 6.5 6.3(L) 6.6  Albumin 3.5 - 5.0 g/dL 3.3(L) 3.1(L) 3.6  AST 15 - 41 U/L 431(H) 26 26  ALT 14 - 54 U/L 143(H) 43 50  Alk Phosphatase 38 - 126 U/L 550(H) 314(H) 342(H)  Total Bilirubin 0.3 - 1.2 mg/dL 0.8 0.3 0.5  Bilirubin, Direct 0.0 - 0.3 mg/dL - - -   ROS: Review of Systems  Constitutional: Negative for chills and fever.  Respiratory: Negative for cough and shortness of breath.   Gastrointestinal: Positive for abdominal pain and nausea. Negative for blood in stool,  constipation, diarrhea and melena.  Genitourinary: Negative for dysuria.    Family History  Problem Relation Age of Onset  . Cancer Sister 39    breast  . Diabetes Sister   . Arthritis Mother   . Arthritis-Osteo Mother   . Diabetes Brother   . Suicidality Brother   . Diabetes Brother   . Breast cancer Sister   . Cancer Maternal Aunt     breast cancer     Past Medical History:  Diagnosis Date  . Cancer (HCC)    squamous cell  . Colon carcinoma ([place])    dx. left Colon cancer,evidence of metastasis  . Complication of anesthesia    "morphine caused severe shakes"  . GERD (gastroesophageal reflux disease)   . Macular degeneration   . Pneumonia 04/2012  . PONV (postoperative nausea and vomiting)   . Rheumatoid Arthritis     Past Surgical History:  Procedure Laterality Date  . CATARACT EXTRACTION, BILATERAL    . Cataracts    . COLONOSCOPY WITH PROPOFOL N/A 04/30/2015   Procedure: COLONOSCOPY WITH PROPOFOL;  Surgeon: Milus Banister, MD;  Location: [facility];  Service: Endoscopy;  Laterality: N/A;  . ESOPHAGOGASTRODUODENOSCOPY N/A 01/22/2015   Procedure: ESOPHAGOGASTRODUODENOSCOPY (EGD);  Surgeon: Milus Banister, MD;  Location: [facility];  Service: Endoscopy;  Laterality: N/A;  . EYE SURGERY     bilateral cataracts  . HAND SURGERY     Pt states she had both hands operated on for arthritis.  Marland Kitchen HAND SURGERY    . HEMORRHOID SURGERY    . IR GENERIC HISTORICAL  02/12/2016  IR US GUIDE VASC ACCESS RIGHT 02/12/2016 Corrie Mckusick, DO [facility]  . IR GENERIC HISTORICAL  02/12/2016   IR FLUORO GUIDE PORT INSERTION RIGHT 02/12/2016 Corrie Mckusick, DO [facility]  . JOINT REPLACEMENT    . MINOR HEMORRHOIDECTOMY    . SHOULDER ARTHROSCOPY Left   . SHOULDER SURGERY Left    arthroscopy  . TONSILLECTOMY    . TOTAL KNEE ARTHROPLASTY     x 2  . TOTAL KNEE ARTHROPLASTY Right   . TOTAL KNEE ARTHROPLASTY Left     Social History:  reports that she has been smoking.  She has  been smoking about 1.00 pack per day. She has never used smokeless tobacco. She reports that she does not drink alcohol or use drugs.  Allergies:  Allergies  Allergen Reactions  . Aspirin Other (See Comments)    Reaction: G I upset, hurts bladder  . Codeine Nausea And Vomiting  . Morphine And Related Nausea And Vomiting  . Penicillins Hives and Swelling    Has patient had a PCN reaction causing immediate rash, facial/tongue/throat swelling, SOB or lightheadedness with hypotension: Yes Has patient had a PCN reaction causing severe rash involving mucus membranes or skin necrosis: No Has patient had a PCN reaction that required hospitalization No Has patient had a PCN reaction occurring within the last 10 years: No If all of the above answers are "NO", then may proceed with Cephalosporin use.      (Not in a hospital admission)  Blood pressure (!) 107/49, pulse 76, temperature 97.9 F (36.6 C), temperature source Oral, resp. rate 14, height '5\' 4"'$  (1.626 m), weight 55.3 kg (122 lb), SpO2 95 %. Physical Exam: General: pleasant, ill-appeaaring white female sitting up in bed in mild abdominal distress HEENT: head is normocephalic, atraumatic.  Mouth is pink and moist Heart: regular, rate, and rhythm.  No obvious murmurs, gallops, or rubs noted.  Palpable pedal pulses bilaterally Lungs: CTAB, no wheezes, rhonchi, or rales noted.  Respiratory effort nonlabored Abd: soft, diffusely TTP over right hemi-abdomen with no peritonitis or guarding, negative murphy's, +BS MS: all 4 extremities are symmetrical with no cyanosis, clubbing, or edema. Skin: warm and dry with no masses, lesions, or rashes Psych: A&Ox3 with an appropriate affect. Neuro:  normal speech  Results for orders placed or performed during the hospital encounter of 05/03/16 (from the past 48 hour(s))  CBC with Differential/Platelet     Status: Abnormal   Collection Time: 05/03/16 12:20 PM  Result Value Ref Range   WBC 8.2 4.0 -  10.5 K/uL   RBC 4.04 3.87 - 5.11 MIL/uL   Hemoglobin 11.2 (L) 12.0 - 15.0 g/dL   HCT 34.9 (L) 36.0 - 46.0 %   MCV 86.4 78.0 - 100.0 fL   MCH 27.7 26.0 - 34.0 pg   MCHC 32.1 30.0 - 36.0 g/dL   RDW 14.0 11.5 - 15.5 %   Platelets 284 150 - 400 K/uL   Neutrophils Relative % 74 %   Neutro Abs 6.0 1.7 - 7.7 K/uL   Lymphocytes Relative 14 %   Lymphs Abs 1.2 0.7 - 4.0 K/uL   Monocytes Relative 8 %   Monocytes Absolute 0.7 0.1 - 1.0 K/uL   Eosinophils Relative 3 %   Eosinophils Absolute 0.2 0.0 - 0.7 K/uL   Basophils Relative 1 %   Basophils Absolute 0.0 0.0 - 0.1 K/uL  Comprehensive metabolic panel     Status: Abnormal   Collection Time: 05/03/16 12:20 PM  Result Value  Ref Range   Sodium 136 135 - 145 mmol/L   Potassium 4.3 3.5 - 5.1 mmol/L   Chloride 102 101 - 111 mmol/L   CO2 27 22 - 32 mmol/L   Glucose, Bld 109 (H) 65 - 99 mg/dL   BUN 21 (H) 6 - 20 mg/dL   Creatinine, Ser 0.95 0.44 - 1.00 mg/dL   Calcium 8.6 (L) 8.9 - 10.3 mg/dL   Total Protein 6.5 6.5 - 8.1 g/dL   Albumin 3.3 (L) 3.5 - 5.0 g/dL   AST 431 (H) 15 - 41 U/L   ALT 143 (H) 14 - 54 U/L   Alkaline Phosphatase 550 (H) 38 - 126 U/L   Total Bilirubin 0.8 0.3 - 1.2 mg/dL   GFR calc non Af Amer 51 (L) >60 mL/min   GFR calc Af Amer 59 (L) >60 mL/min    Comment: (NOTE) The eGFR has been calculated using the CKD EPI equation. This calculation has not been validated in all clinical situations. eGFR's persistently <60 mL/min signify possible Chronic Kidney Disease.    Anion gap 7 5 - 15  Lipase, blood     Status: Abnormal   Collection Time: 05/03/16 12:20 PM  Result Value Ref Range   Lipase 83 (H) 11 - 51 U/L   US Abdomen Complete  Result Date: 05/03/2016 CLINICAL DATA:  Abdominal pain EXAM: ABDOMEN ULTRASOUND COMPLETE COMPARISON:  04/25/2016 FINDINGS: Gallbladder: Multiple gallstones are again identified stable from the prior CT examination. No gallbladder wall thickening or pericholecystic fluid is noted. Common bile  duct: Diameter: Dilated at 11 mm. This is similar to that seen on prior CT examination. Liver: No focal lesion identified. Within normal limits in parenchymal echogenicity. IVC: No abnormality visualized. Pancreas: Visualized portion unremarkable. Spleen: Size and appearance within normal limits. Right Kidney: Length: 9.4 cm. Echogenicity within normal limits. No mass or hydronephrosis visualized. Left Kidney: Length: 9.3 cm. Echogenicity within normal limits. No mass or hydronephrosis visualized. Abdominal aorta: No aneurysm visualized. Other findings: None. IMPRESSION: Cholelithiasis. Common bile duct dilatation stable from the previous exam. Correlation with bilirubin level is recommended. Electronically Signed   By: Inez Catalina M.D.   On: 05/03/2016 15:06   Assessment/Plan Stage IV colon cancer with metastasis to porta hepatis  Cholelithiasis Transaminitis  Afebrile, no leukocytosis  Common bile duct dilation stable from previous imaging  Will order HIDA scan to r/o acute cholecystitis in the setting of known cholelithiasis with elevated LFT's. MRCP may be warranted to further evaluate bilary tree. GI to further evaluate for possible ERCP. Suspect biliary dilation is secondary to known periportal node metastasis.   GERD  Jill Alexanders, [facility] Surgery 05/03/2016, 3:34 PM Pager: [phone] Consults: [phone] Mon-Fri 7:00 am-4:30 pm Sat-Sun 7:00 am-11:30 am

## 2016-05-03 NOTE — ED Triage Notes (Addendum)
Pt c/o RUQ pain x 2 years increasing x 2 hours.  Pain score 10/10.  Pt reports taking prescribed pain medication w/o relief.  Hx of colon CA w/ mets.  Sts she is not taking chemo or radiation.  Last treatment x 3 weeks.  Denies n/v/d.  Pt has been seen several times in ED recently for same/pain control.

## 2016-05-03 NOTE — H&P (Signed)
History and Physical   Joann Herrera:[id number] DOB: 06-16-1923 DOA: 05/03/2016  Referring MD/NP/PA: Lacretia Leigh, MD PCP: [name], MD Outpatient Specialists:  [name], MD as PCP - General (Internal Medicine) Truitt Merle, MD as Consulting Physician (Hematology) Erroll Luna, MD as Consulting Physician (General Surgery) Milus Banister, MD as Attending Physician (Gastroenterology)  Patient coming from: Home  Chief Complaint: Abdominal pain  HPI: Joann Herrera is a 83 y.o. female with a history of colon CA with suspected metastases presenting with abdominal pain. She reports about 2 years of waxing/waning RUQ abdominal pain that is severe, aching, radiating to the back, sometimes worse with food. Has had nausea and vomiting in the past, but not today. Pain better with tylenol and po dilaudid. Has been eating less today and diffusely weak. She had been seen 4 times in the past 2 weeks for similar complaints.   ED Course: In the ED she was afebrile, HR 72bpm, BP 121/56, 96% on room air and RUQ tenderness to palpation. LFTs noted to be trending upward, Alk Phos 550, AST 431, ALT 143, TBili 0.8, from previous values. WBC normal at 8.2. Abdominal U/S showed cholelithiasis and CBD dilatation stable from recent CT abdomen. GI and general surgery were consulted and [facility] asked to admit.  Review of Systems: No fevers, and per HPI. All others reviewed and are negative.   Past Medical History:  Diagnosis Date  . Cancer (HCC)    squamous cell  . Colon carcinoma ([place])    dx. left Colon cancer,evidence of metastasis  . Complication of anesthesia    "morphine caused severe shakes"  . GERD (gastroesophageal reflux disease)   . Macular degeneration   . Pneumonia 04/2012  . PONV (postoperative nausea and vomiting)   . Rheumatoid Arthritis     Past Surgical History:  Procedure Laterality Date  . CATARACT EXTRACTION, BILATERAL    . Cataracts    . COLONOSCOPY WITH PROPOFOL N/A  04/30/2015   Procedure: COLONOSCOPY WITH PROPOFOL;  Surgeon: Milus Banister, MD;  Location: [facility];  Service: Endoscopy;  Laterality: N/A;  . ESOPHAGOGASTRODUODENOSCOPY N/A 01/22/2015   Procedure: ESOPHAGOGASTRODUODENOSCOPY (EGD);  Surgeon: Milus Banister, MD;  Location: [facility];  Service: Endoscopy;  Laterality: N/A;  . EYE SURGERY     bilateral cataracts  . HAND SURGERY     Pt states she had both hands operated on for arthritis.  Marland Kitchen HAND SURGERY    . HEMORRHOID SURGERY    . IR GENERIC HISTORICAL  02/12/2016   IR US GUIDE VASC ACCESS RIGHT 02/12/2016 Corrie Mckusick, DO [facility]  . IR GENERIC HISTORICAL  02/12/2016   IR FLUORO GUIDE PORT INSERTION RIGHT 02/12/2016 Corrie Mckusick, DO [facility]  . JOINT REPLACEMENT    . MINOR HEMORRHOIDECTOMY    . SHOULDER ARTHROSCOPY Left   . SHOULDER SURGERY Left    arthroscopy  . TONSILLECTOMY    . TOTAL KNEE ARTHROPLASTY     x 2  . TOTAL KNEE ARTHROPLASTY Right   . TOTAL KNEE ARTHROPLASTY Left    - Has been smoking cigarettes, less than 1 ppd these days. No EtOH, no illicit drugs. Lives alone. Gets around with RW.   Allergies  Allergen Reactions  . Aspirin Other (See Comments)    Reaction: G I upset, hurts bladder  . Codeine Nausea And Vomiting  . Morphine And Related Nausea And Vomiting  . Penicillins Hives and Swelling    Has patient had a PCN  reaction causing immediate rash, facial/tongue/throat swelling, SOB or lightheadedness with hypotension: Yes Has patient had a PCN reaction causing severe rash involving mucus membranes or skin necrosis: No Has patient had a PCN reaction that required hospitalization No Has patient had a PCN reaction occurring within the last 10 years: No If all of the above answers are "NO", then may proceed with Cephalosporin use.     Family History  Problem Relation Age of Onset  . Cancer Sister 76    breast  . Diabetes Sister   . Arthritis Mother   . Arthritis-Osteo Mother   . Diabetes  Brother   . Suicidality Brother   . Diabetes Brother   . Breast cancer Sister   . Cancer Maternal Aunt     breast cancer    - Family history otherwise reviewed and not pertinent.  Prior to Admission medications   Medication Sig Start Date End Date Taking? Authorizing Provider  acetaminophen (TYLENOL) 500 MG tablet Take 1,000 mg by mouth every 6 (six) hours as needed for mild pain or moderate pain.    Yes Historical Provider, MD  HYDROmorphone (DILAUDID) 2 MG tablet Take 0.5-1 tablets (1-2 mg total) by mouth every 4 (four) hours as needed for severe pain. 04/29/16  Yes Truitt Merle, MD  lidocaine-prilocaine (EMLA) cream Apply 1 application topically as needed. Patient taking differently: Apply 1 application topically daily as needed (port access).  04/08/16  Yes Owens Shark, NP  ondansetron (ZOFRAN) 8 MG tablet Take 1 tablet (8 mg total) by mouth every 8 (eight) hours as needed for nausea or vomiting. 04/29/16  Yes Truitt Merle, MD  Polyethyl Glycol-Propyl Glycol (SYSTANE OP) Apply 1-2 drops to eye 4 (four) times daily as needed (for dry eye).   Yes Historical Provider, MD  traMADol (ULTRAM) 50 MG tablet Take 1 tablet (50 mg total) by mouth every 8 (eight) hours as needed. Patient taking differently: Take 50 mg by mouth every 8 (eight) hours as needed for moderate pain or severe pain.  04/08/16  Yes Owens Shark, NP  ondansetron (ZOFRAN ODT) 4 MG disintegrating tablet Take 1 tablet (4 mg total) by mouth every 8 (eight) hours as needed for nausea or vomiting. Patient not taking: Reported on 05/03/2016 04/25/16   Carlisle Cater, PA-C    Physical Exam: Vitals:   05/03/16 1022 05/03/16 1333 05/03/16 1539 05/03/16 1630  BP:  (!) 107/49 102/56 123/97  Pulse:  76 68 77  Resp:  '14 16 16  '$ Temp:      TempSrc:      SpO2:  95% 96% 94%  Weight: 55.3 kg (122 lb)     Height: '5\' 4"'$  (1.626 m)      Constitutional: 83 y.o. female in no distress, calm demeanor Eyes: Lids and conjunctivae normal, PERRL ENMT: Mucous  membranes are dry. Posterior pharynx not visible on exam. Poor dentition.  Neck: normal, supple, no masses, no thyromegaly Respiratory: Non-labored breathing room air without accessory muscle use. Clear but distant breath sounds to auscultation bilaterally Cardiovascular: Regular rate and rhythm, no murmurs, rubs, or gallops. No carotid bruits. No JVD. No LE edema. 1+ pedal pulses. Abdomen: Normoactive bowel sounds. Mild tenderness to palpation with negative Murphy's in RUQ, diffusely uncomfortable to palpation, non-distended, and no masses palpated. No hepatosplenomegaly. GU: No indwelling catheter Musculoskeletal: No clubbing / cyanosis. No joint deformity upper and lower extremities. Good ROM, no contractures. Normal muscle tone.  Skin: Warm, dry. No rashes, wounds, no ulcers. No significant lesions noted.  Neurologic: CN II-XII grossly intact. Gait slow, narrow based. Speech normal. No focal deficits in motor strength or sensation in all extremities. HOH. Psychiatric: Alert and oriented x3. Normal judgment and insight. Mood depressed with flattened affect.   Labs on Admission: I have personally reviewed following labs and imaging studies  CBC:  Recent Labs Lab 05/01/16 0255 05/03/16 1220  WBC 5.9 8.2  NEUTROABS 3.2 6.0  HGB 10.8* 11.2*  HCT 33.3* 34.9*  MCV 88.1 86.4  PLT 292 938   Basic Metabolic Panel:  Recent Labs Lab 05/01/16 0255 05/03/16 1220  NA 136 136  K 4.3 4.3  CL 104 102  CO2 23 27  GLUCOSE 109* 109*  BUN 18 21*  CREATININE 0.68 0.95  CALCIUM 8.3* 8.6*   GFR: Estimated Creatinine Clearance: 34 mL/min (by C-G formula based on SCr of 0.95 mg/dL). Liver Function Tests:  Recent Labs Lab 05/01/16 0255 05/03/16 1220  AST 26 431*  ALT 43 143*  ALKPHOS 314* 550*  BILITOT 0.3 0.8  PROT 6.3* 6.5  ALBUMIN 3.1* 3.3*    Recent Labs Lab 05/01/16 0255 05/03/16 1220  LIPASE 83* 83*   No results for input(s): AMMONIA in the last 168 hours. Coagulation  Profile: No results for input(s): INR, PROTIME in the last 168 hours. Cardiac Enzymes: No results for input(s): CKTOTAL, CKMB, CKMBINDEX, TROPONINI in the last 168 hours. BNP (last 3 results) No results for input(s): PROBNP in the last 8760 hours. HbA1C: No results for input(s): HGBA1C in the last 72 hours. CBG: No results for input(s): GLUCAP in the last 168 hours. Lipid Profile: No results for input(s): CHOL, HDL, LDLCALC, TRIG, CHOLHDL, LDLDIRECT in the last 72 hours. Thyroid Function Tests: No results for input(s): TSH, T4TOTAL, FREET4, T3FREE, THYROIDAB in the last 72 hours. Anemia Panel: No results for input(s): VITAMINB12, FOLATE, FERRITIN, TIBC, IRON, RETICCTPCT in the last 72 hours. Urine analysis:    Component Value Date/Time   COLORURINE YELLOW 04/25/2016 0013   APPEARANCEUR CLEAR 04/25/2016 0013   LABSPEC 1.013 04/25/2016 0013   LABSPEC 1.025 12/25/2015 1056   PHURINE 7.0 04/25/2016 0013   GLUCOSEU NEGATIVE 04/25/2016 0013   GLUCOSEU Negative 12/25/2015 1056   HGBUR SMALL (A) 04/25/2016 0013   BILIRUBINUR NEGATIVE 04/25/2016 0013   BILIRUBINUR Negative 12/25/2015 1056   KETONESUR NEGATIVE 04/25/2016 0013   PROTEINUR NEGATIVE 04/25/2016 0013   UROBILINOGEN 0.2 12/25/2015 1056   NITRITE NEGATIVE 04/25/2016 0013   LEUKOCYTESUR SMALL (A) 04/25/2016 0013   LEUKOCYTESUR Negative 12/25/2015 1056   Sepsis Labs: '@LABRCNTIP'$ (procalcitonin:4,lacticidven:4) )No results found for this or any previous visit (from the past 240 hour(s)).   Radiological Exams on Admission: US Abdomen Complete  Result Date: 05/03/2016 CLINICAL DATA:  Abdominal pain EXAM: ABDOMEN ULTRASOUND COMPLETE COMPARISON:  04/25/2016 FINDINGS: Gallbladder: Multiple gallstones are again identified stable from the prior CT examination. No gallbladder wall thickening or pericholecystic fluid is noted. Common bile duct: Diameter: Dilated at 11 mm. This is similar to that seen on prior CT examination. Liver: No  focal lesion identified. Within normal limits in parenchymal echogenicity. IVC: No abnormality visualized. Pancreas: Visualized portion unremarkable. Spleen: Size and appearance within normal limits. Right Kidney: Length: 9.4 cm. Echogenicity within normal limits. No mass or hydronephrosis visualized. Left Kidney: Length: 9.3 cm. Echogenicity within normal limits. No mass or hydronephrosis visualized. Abdominal aorta: No aneurysm visualized. Other findings: None. IMPRESSION: Cholelithiasis. Common bile duct dilatation stable from the previous exam. Correlation with bilirubin level is recommended. Electronically Signed   By: Elta Guadeloupe  Lukens M.D.   On: 05/03/2016 15:06   Assessment/Plan Active Problems:   Abdominal pain   Abdominal pain: Acute on chronic due to metastases +/- biliary pancreatitis. Lipase stable and mildly elevated w/CBD dilitation.  - Dilaudid '1mg'$  IV q3h prn - Zofran '4mg'$  IV q8h prn  Cholelithiasis:  - HIDA scan for r/o cholecystitis per general surgery  Elevated LFTs: Suspected cause is obstruction from periportal node metastasis.  - MRCP per GI - Keep NPO, continue maintenance IVF's  Poorly-differentiated colon adenocarcinoma clinical stage IV with periportal node metastases: Followed by [name] on reduced-dose Beryle Flock. CT 01/29/16 showed stable disease. Recent plan was to schedule PET scan.  - Will add [name] to treatment team. - Consider bowel regimen for chronic constipation once taking po - Previously declined hospice and surgery. Will consult palliative care for ongoing goals of care discussion. Suspect her subacute worsening weakness is related to malignancy.  DVT prophylaxis: Heparin  Code Status: Full - pt very imprecise with answering this question. Son at bedside reports her answer changes day-to-day. Will keep full for now.  Family Communication: Son at bedside Disposition Plan: Admit to med-surg for management Consults called: General surgery, GI  Admission  status: Inpatient    Vance Gather, MD [facility] Pager [phone]  If 7PM-7AM, please contact night-coverage [email or web address] Password [email or web address] 05/03/2016, 5:20 PM

## 2016-05-04 ENCOUNTER — Inpatient Hospital Stay (HOSPITAL_COMMUNITY): Payer: Medicare Other

## 2016-05-04 ENCOUNTER — Encounter (HOSPITAL_COMMUNITY): Payer: Self-pay | Admitting: Interventional Radiology

## 2016-05-04 DIAGNOSIS — K810 Acute cholecystitis: Secondary | ICD-10-CM

## 2016-05-04 DIAGNOSIS — K5909 Other constipation: Secondary | ICD-10-CM

## 2016-05-04 DIAGNOSIS — R109 Unspecified abdominal pain: Secondary | ICD-10-CM

## 2016-05-04 DIAGNOSIS — R945 Abnormal results of liver function studies: Secondary | ICD-10-CM

## 2016-05-04 DIAGNOSIS — K811 Chronic cholecystitis: Secondary | ICD-10-CM

## 2016-05-04 DIAGNOSIS — R7989 Other specified abnormal findings of blood chemistry: Secondary | ICD-10-CM

## 2016-05-04 DIAGNOSIS — C186 Malignant neoplasm of descending colon: Secondary | ICD-10-CM

## 2016-05-04 DIAGNOSIS — K8061 Calculus of gallbladder and bile duct with cholecystitis, unspecified, with obstruction: Secondary | ICD-10-CM

## 2016-05-04 DIAGNOSIS — R1010 Upper abdominal pain, unspecified: Secondary | ICD-10-CM

## 2016-05-04 HISTORY — PX: IR GENERIC HISTORICAL: IMG1180011

## 2016-05-04 LAB — COMPREHENSIVE METABOLIC PANEL
ALT: 254 U/L — ABNORMAL HIGH (ref 14–54)
ANION GAP: 5 (ref 5–15)
AST: 237 U/L — ABNORMAL HIGH (ref 15–41)
Albumin: 2.9 g/dL — ABNORMAL LOW (ref 3.5–5.0)
Alkaline Phosphatase: 462 U/L — ABNORMAL HIGH (ref 38–126)
BUN: 16 mg/dL (ref 6–20)
CALCIUM: 7.9 mg/dL — AB (ref 8.9–10.3)
CHLORIDE: 107 mmol/L (ref 101–111)
CO2: 26 mmol/L (ref 22–32)
CREATININE: 0.83 mg/dL (ref 0.44–1.00)
Glucose, Bld: 102 mg/dL — ABNORMAL HIGH (ref 65–99)
Potassium: 3.8 mmol/L (ref 3.5–5.1)
SODIUM: 138 mmol/L (ref 135–145)
TOTAL PROTEIN: 5.8 g/dL — AB (ref 6.5–8.1)
Total Bilirubin: 0.6 mg/dL (ref 0.3–1.2)

## 2016-05-04 LAB — CBC
HCT: 29.4 % — ABNORMAL LOW (ref 36.0–46.0)
Hemoglobin: 9.2 g/dL — ABNORMAL LOW (ref 12.0–15.0)
MCH: 27.1 pg (ref 26.0–34.0)
MCHC: 31.3 g/dL (ref 30.0–36.0)
MCV: 86.7 fL (ref 78.0–100.0)
Platelets: 185 K/uL (ref 150–400)
RBC: 3.39 MIL/uL — ABNORMAL LOW (ref 3.87–5.11)
RDW: 14 % (ref 11.5–15.5)
WBC: 4.2 K/uL (ref 4.0–10.5)

## 2016-05-04 LAB — PROTIME-INR
INR: 1.06
Prothrombin Time: 13.9 s (ref 11.4–15.2)

## 2016-05-04 MED ORDER — FENTANYL CITRATE (PF) 100 MCG/2ML IJ SOLN
INTRAMUSCULAR | Status: AC
  Filled 2016-05-04: qty 2

## 2016-05-04 MED ORDER — OXYCODONE-ACETAMINOPHEN 5-325 MG PO TABS
2.0000 | ORAL_TABLET | Freq: Once | ORAL | Status: AC
  Administered 2016-05-04: 2 via ORAL
  Filled 2016-05-04: qty 2

## 2016-05-04 MED ORDER — HYDROMORPHONE HCL 1 MG/ML IJ SOLN
1.0000 mg | Freq: Once | INTRAMUSCULAR | Status: AC
  Administered 2016-05-04: 1 mg via INTRAVENOUS
  Filled 2016-05-04: qty 1

## 2016-05-04 MED ORDER — MIDAZOLAM HCL 2 MG/2ML IJ SOLN
INTRAMUSCULAR | Status: AC | PRN
  Administered 2016-05-04 (×4): 0.5 mg via INTRAVENOUS

## 2016-05-04 MED ORDER — OXYCODONE-ACETAMINOPHEN 5-325 MG PO TABS
1.0000 | ORAL_TABLET | ORAL | Status: DC | PRN
  Administered 2016-05-05 – 2016-05-08 (×10): 2 via ORAL
  Filled 2016-05-04 (×10): qty 2

## 2016-05-04 MED ORDER — LIDOCAINE HCL 1 % IJ SOLN
INTRAMUSCULAR | Status: AC | PRN
  Administered 2016-05-04: 10 mL via INTRADERMAL

## 2016-05-04 MED ORDER — IOPAMIDOL (ISOVUE-300) INJECTION 61%
INTRAVENOUS | Status: AC
  Administered 2016-05-04: 5 mL via INTRAVENOUS
  Filled 2016-05-04: qty 50

## 2016-05-04 MED ORDER — SODIUM CHLORIDE 0.9% FLUSH
10.0000 mL | INTRAVENOUS | Status: DC | PRN
  Administered 2016-05-05 – 2016-05-08 (×2): 10 mL
  Filled 2016-05-04 (×2): qty 40

## 2016-05-04 MED ORDER — IOPAMIDOL (ISOVUE-300) INJECTION 61%
50.0000 mL | Freq: Once | INTRAVENOUS | Status: AC | PRN
  Administered 2016-05-04: 5 mL via INTRAVENOUS

## 2016-05-04 MED ORDER — MIDAZOLAM HCL 2 MG/2ML IJ SOLN
INTRAMUSCULAR | Status: AC
  Filled 2016-05-04: qty 4

## 2016-05-04 MED ORDER — CIPROFLOXACIN IN D5W 400 MG/200ML IV SOLN
400.0000 mg | Freq: Two times a day (BID) | INTRAVENOUS | Status: DC
  Administered 2016-05-04 – 2016-05-06 (×4): 400 mg via INTRAVENOUS
  Filled 2016-05-04 (×4): qty 200

## 2016-05-04 MED ORDER — FENTANYL CITRATE (PF) 100 MCG/2ML IJ SOLN
INTRAMUSCULAR | Status: AC | PRN
  Administered 2016-05-04 (×2): 25 ug via INTRAVENOUS

## 2016-05-04 MED ORDER — LIDOCAINE HCL 1 % IJ SOLN
INTRAMUSCULAR | Status: AC
  Filled 2016-05-04: qty 20

## 2016-05-04 NOTE — Procedures (Signed)
Interventional Radiology Procedure Note  Procedure: Perc Chole.  36F drain.  Complications: None Recommendations:  - Record output - Do not submerge  - Routine drain care - BID-TID 5cc sterile flush.  When [place] home, may use once daily flush with 5cc - follow up in drain clinic with [facility] in 8-12 weeks. [phone]   Signed,  Dulcy Fanny Earleen Newport, DO

## 2016-05-04 NOTE — Evaluation (Signed)
Physical Therapy Evaluation Patient Details Name: Joann Herrera MRN: [id number] DOB: 08-30-1923 Today's Date: 05/04/2016   History of Present Illness  Pt is a 83 y/o female w/ h/o colon ca w/ suspected mets. She was admitted to [facility] hospital w/ c/o R upper quadrant pain w/ nausea and vomiting. See EPIC for PMH. She lives alone and ambulates with a RW.   Clinical Impression  Pt admitted with above diagnosis. Pt currently with functional limitations due to the deficits listed below (see PT Problem List). Pt will benefit from skilled PT to increase their independence and safety with mobility to allow discharge to the venue listed below.       Follow Up Recommendations SNF;Supervision/Assistance - 24 hour (if pt agreeable)    Equipment Recommendations  None recommended by PT    Recommendations for Other Services       Precautions / Restrictions Precautions Precautions: Fall Restrictions Weight Bearing Restrictions: No      Mobility  Bed Mobility Overal bed mobility: Needs Assistance Bed Mobility: Supine to Sit     Supine to sit: Supervision;Min guard     General bed mobility comments: Pt somewhat impulsive, ? this may be related to pain medications per son and pt.  Transfers Overall transfer level: Needs assistance Equipment used: Rolling walker (2 wheeled) Transfers: Sit to/from [facility] Sit to Stand: Min assist Stand pivot transfers: Min assist       General transfer comment: Pt w/ LOB during transfers and when moving quickly in bathroom. She was able to self correct but currently requires Min A for safety. She was also noted to let go of her RW when returning to recliner - VC's and Min A needed.  Ambulation/Gait Ambulation/Gait assistance: Min assist Ambulation Distance (Feet): 40 Feet (10' more) Assistive device: Rolling walker (2 wheeled) Gait Pattern/deviations: Step-through pattern;Trunk flexed;Narrow base of support     General Gait  Details: LOB multiple times, unsteady (possiibly d/t pain meds), min assist for safety and to recover balance, pt balance reactions delayed  Stairs            Wheelchair Mobility    Modified Rankin (Stroke Patients Only)       Balance Overall balance assessment:  (denies falls) Sitting-balance support: Feet supported;Bilateral upper extremity supported Sitting balance-Leahy Scale: Good     Standing balance support: No upper extremity supported;During functional activity Standing balance-Leahy Scale: Fair Standing balance comment: Pt requires Min A for LOB at times during transfers and functional activity - this may be related to pain medications per pt and son.                             Pertinent Vitals/Pain Pain Assessment: No/denies pain    Home Living Family/patient expects to be discharged to:: Private residence Living Arrangements: Alone Available Help at Discharge: Family;Available PRN/intermittently Type of Home: House Home Access: Stairs to enter Entrance Stairs-Rails: Left Entrance Stairs-Number of Steps: 3 Home Layout: One level Home Equipment: Walker - 2 wheels;Bedside commode Additional Comments: Family assists grocery shopping, yard work and cleaning lady every other week. Pt doesn't drive; son supportive but lives "82 miles away"    Prior Function Level of Independence: Needs assistance   Gait / Transfers Assistance Needed: Pt uses RW at home "to get around"  ADL's / Homemaking Assistance Needed: Pt doesn't drive; Son assist with yard work and [employer].  Comments: Pt uses a screen and glasses to  help see TV at home due to Mac degeneration/visual deficits.     Hand Dominance   Dominant Hand: Right    Extremity/Trunk Assessment   Upper Extremity Assessment Upper Extremity Assessment: Overall WFL for tasks assessed    Lower Extremity Assessment Lower Extremity Assessment: Overall WFL for tasks assessed        Communication   Communication: No difficulties  Cognition Arousal/Alertness: Awake/alert Behavior During Therapy: Flat affect;WFL for tasks assessed/performed Overall Cognitive Status: Within Functional Limits for tasks assessed                      General Comments      Exercises     Assessment/Plan    PT Assessment Patient needs continued PT services  PT Problem List Decreased strength;Decreased activity tolerance;Decreased balance;Decreased mobility;Decreased safety awareness          PT Treatment Interventions DME instruction;Gait training;Functional mobility training;Therapeutic exercise;Therapeutic activities;Patient/family education    PT Goals (Current goals can be found in the Care Plan section)  Acute Rehab PT Goals Patient Stated Goal: Decreased pain; home as able per pt/son PT Goal Formulation: With patient Time For Goal Achievement: 05/18/16 Potential to Achieve Goals: Good    Frequency Min 3X/week   Barriers to discharge        Co-evaluation PT/OT/SLP Co-Evaluation/Treatment: Yes Reason for Co-Treatment: To address functional/ADL transfers;Complexity of the patient's impairments (multi-system involvement) PT goals addressed during session: Mobility/safety with mobility OT goals addressed during session: ADL's and self-care       End of Session Equipment Utilized During Treatment: Gait belt Activity Tolerance: Patient tolerated treatment well Patient left: in chair;with call bell/phone within reach;with family/visitor present;with chair alarm set           Time: 1610-9604 PT Time Calculation (min) (ACUTE ONLY): 27 min   Charges:   PT Evaluation $PT Eval Low Complexity: 1 Procedure     PT G Codes:        Joann Herrera 2016/05/11, 11:39 AM

## 2016-05-04 NOTE — Progress Notes (Signed)
Patient ID: Joann Herrera, female   DOB: September 28, 1923, 83 y.o.   MRN: [id number]    Progress Note   Subjective   Comfortable currently, says pain was bad last night  Son at bedside   Objective   Vital signs in last 24 hours: Temp:  [98.9 F (37.2 C)-99.6 F (37.6 C)] 98.9 F (37.2 C) (01/10 0518) Pulse Rate:  [65-77] 65 (01/10 0518) Resp:  [14-18] 18 (01/10 0518) BP: (102-123)/(44-97) 103/57 (01/10 0518) SpO2:  [91 %-99 %] 99 % (01/10 0518)   General:   elderly white female in NAD Heart:  Regular rate and rhythm; no murmurs Lungs: Respirations even and unlabored, lungs CTA bilaterally Abdomen:  Soft, tender  RMQ and nondistended. Normal bowel sounds, no palp mass  Extremities:  Without edema. Neurologic:  Alert and oriented,  grossly normal neurologically. Psych:  Cooperative. Normal mood and affect.  Intake/Output from previous day: 01/09 0701 - 01/10 0700 In: 936.7 [I.V.:936.7] Out: -  Intake/Output this shift: No intake/output data recorded.  Lab Results:  Recent Labs  05/03/16 1220  WBC 8.2  HGB 11.2*  HCT 34.9*  PLT 284   BMET  Recent Labs  05/03/16 1220  NA 136  K 4.3  CL 102  CO2 27  GLUCOSE 109*  BUN 21*  CREATININE 0.95  CALCIUM 8.6*   LFT  Recent Labs  05/03/16 1220  PROT 6.5  ALBUMIN 3.3*  AST 431*  ALT 143*  ALKPHOS 550*  BILITOT 0.8   PT/INR No results for input(s): LABPROT, INR in the last 72 hours.  Studies/Results: Nm Hepatobiliary Liver Func  Result Date: 05/04/2016 CLINICAL DATA:  Abnormal LFTs. Chronic worsening right upper quadrant abdominal pain. Initial encounter. EXAM: NUCLEAR MEDICINE HEPATOBILIARY IMAGING TECHNIQUE: Sequential images of the abdomen were obtained out to 60 minutes following intravenous administration of radiopharmaceutical. The gallbladder was not visualized. As the patient has an allergy to morphine, slow intravenous infusion of 1.1 mcg of cholecystokinin was performed. RADIOPHARMACEUTICALS:  5.05  mCi Tc-78m Choletec IV COMPARISON:  None. FINDINGS: Prompt uptake and biliary excretion of activity by the liver is seen. Biliary activity passes into small bowel, consistent with patent common bile duct. The gallbladder is not visualized at 45 minutes after administration of radiopharmaceutical. As the patient has an allergy to morphine, slow intravenous infusion of cholecystokinin was performed. The gallbladder was not visualized at 1 hour following initiation of infusion. This is most compatible with acute cholecystitis, though in some cases, chronic cholecystitis can have a similar appearance. IMPRESSION: Prompt uptake and biliary excretion of activity by the liver. Gallbladder not visualized, even after infusion of cholecystokinin. This is most compatible with acute cholecystitis, though in some cases, chronic cholecystitis can have a similar appearance. Electronically Signed   By: Garald Balding M.D.   On: 05/04/2016 02:38   US Abdomen Complete  Result Date: 05/03/2016 CLINICAL DATA:  Abdominal pain EXAM: ABDOMEN ULTRASOUND COMPLETE COMPARISON:  04/25/2016 FINDINGS: Gallbladder: Multiple gallstones are again identified stable from the prior CT examination. No gallbladder wall thickening or pericholecystic fluid is noted. Common bile duct: Diameter: Dilated at 11 mm. This is similar to that seen on prior CT examination. Liver: No focal lesion identified. Within normal limits in parenchymal echogenicity. IVC: No abnormality visualized. Pancreas: Visualized portion unremarkable. Spleen: Size and appearance within normal limits. Right Kidney: Length: 9.4 cm. Echogenicity within normal limits. No mass or hydronephrosis visualized. Left Kidney: Length: 9.3 cm. Echogenicity within normal limits. No mass or hydronephrosis visualized. Abdominal aorta:  No aneurysm visualized. Other findings: None. IMPRESSION: Cholelithiasis. Common bile duct dilatation stable from the previous exam. Correlation with bilirubin level  is recommended. Electronically Signed   By: Inez Catalina M.D.   On: 05/03/2016 15:06   Mr 3d Recon At Scanner  Result Date: 05/03/2016 CLINICAL DATA:  83 year old female with history of colon cancer. Two year history of intermittent right upper quadrant abdominal pain. EXAM: MRI ABDOMEN WITHOUT AND WITH CONTRAST (INCLUDING MRCP) TECHNIQUE: Multiplanar multisequence MR imaging of the abdomen was performed both before and after the administration of intravenous contrast. Heavily T2-weighted images of the biliary and pancreatic ducts were obtained, and three-dimensional MRCP images were rendered by post processing. CONTRAST:  53mL MULTIHANCE GADOBENATE DIMEGLUMINE 529 MG/ML IV SOLN COMPARISON:  CT scan 04/25/2016 and abdominal ultrasound 05/03/2016. FINDINGS: Lower chest: The lung bases demonstrate chronic basilar scarring and emphysema. No effusions or pulmonary lesions. No pericardial effusion. Hepatobiliary: No focal hepatic lesions are identified. Geographic fatty infiltration is noted. There is stable intra and extrahepatic biliary dilatation. The gallbladder is filled with gallstones. The common bile duct is dilated to a maximum of 13.5 mm in the porta hepatis and 11.5 mm in the head of the pancreas. This is a stable finding. No common bile duct stones are identified. Pancreas:  No mass, inflammation or ductal dilatation. Spleen:  Normal size.  No focal lesions. Adrenals/Urinary Tract: The adrenal glands and kidneys are unremarkable. Lower pole right renal cyst is noted. Stomach/Bowel: The stomach, duodenum, visualized small bowel and visualize colon are grossly normal. Vascular/Lymphatic: Stable atherosclerotic calcifications involving the aorta and branch vessels but no aneurysm or dissection. Small scattered mesenteric and retroperitoneal lymph nodes but no mass or overt adenopathy. Other:  No ascites or abdominal wall hernia. Musculoskeletal: No significant bony findings. IMPRESSION: 1. Cholelithiasis but  no findings suspicious for acute cholecystitis and no common bile duct stones. Chronic intra and extrahepatic biliary dilatation. 2. Normal MR appearance of the pancreas. Normal caliber and course of the main pancreatic duct. Electronically Signed   By: Marijo Sanes M.D.   On: 05/03/2016 21:18   Mr Abdomen Mrcp Moise Boring Contast  Result Date: 05/03/2016 CLINICAL DATA:  83 year old female with history of colon cancer. Two year history of intermittent right upper quadrant abdominal pain. EXAM: MRI ABDOMEN WITHOUT AND WITH CONTRAST (INCLUDING MRCP) TECHNIQUE: Multiplanar multisequence MR imaging of the abdomen was performed both before and after the administration of intravenous contrast. Heavily T2-weighted images of the biliary and pancreatic ducts were obtained, and three-dimensional MRCP images were rendered by post processing. CONTRAST:  59mL MULTIHANCE GADOBENATE DIMEGLUMINE 529 MG/ML IV SOLN COMPARISON:  CT scan 04/25/2016 and abdominal ultrasound 05/03/2016. FINDINGS: Lower chest: The lung bases demonstrate chronic basilar scarring and emphysema. No effusions or pulmonary lesions. No pericardial effusion. Hepatobiliary: No focal hepatic lesions are identified. Geographic fatty infiltration is noted. There is stable intra and extrahepatic biliary dilatation. The gallbladder is filled with gallstones. The common bile duct is dilated to a maximum of 13.5 mm in the porta hepatis and 11.5 mm in the head of the pancreas. This is a stable finding. No common bile duct stones are identified. Pancreas:  No mass, inflammation or ductal dilatation. Spleen:  Normal size.  No focal lesions. Adrenals/Urinary Tract: The adrenal glands and kidneys are unremarkable. Lower pole right renal cyst is noted. Stomach/Bowel: The stomach, duodenum, visualized small bowel and visualize colon are grossly normal. Vascular/Lymphatic: Stable atherosclerotic calcifications involving the aorta and branch vessels but no aneurysm or dissection.  Small scattered mesenteric and retroperitoneal lymph nodes but no mass or overt adenopathy. Other:  No ascites or abdominal wall hernia. Musculoskeletal: No significant bony findings. IMPRESSION: 1. Cholelithiasis but no findings suspicious for acute cholecystitis and no common bile duct stones. Chronic intra and extrahepatic biliary dilatation. 2. Normal MR appearance of the pancreas. Normal caliber and course of the main pancreatic duct. Electronically Signed   By: Marijo Sanes M.D.   On: 05/03/2016 21:18     IMP/PLAN; #1 83 yo female with sigmoid Colon cancer dx 04/2015- managed non operatively with Keytruda who has been having worsening RUQ abdominal  Pain and a couple previous ER visits admitted yesterday with acute worsening of RUQ pain . Workup with MRCP shows GB full of stones and dilated CBD which is stable and no CBD stones HIDA scan is positive  No obvious metastatic disease in liver on MRI, but had had porta hepatis adenopathy  Surgery is involved and decision is surgical  Regarding lap Chole vs cholecystostomy tube   Will start IV Cipro (pen allergic) IMP IIM  Principal Problem:   Abdominal pain Active Problems:   GERD (gastroesophageal reflux disease)   Cholelithiasis   Dilated cbd, acquired   Cancer of left colon ([place])   Dehydration   Constipation     LOS: 1 day   Joann Herrera  05/04/2016, 10:27 AM

## 2016-05-04 NOTE — Progress Notes (Signed)
MEDICATION-RELATED CONSULT NOTE   IR Procedure Consult - Anticoagulant/Antiplatelet PTA/Inpatient Med List Review by Pharmacist    Procedure: IR perc cholecystectomy    Completed: 16:14  Post-Procedural bleeding risk per IR MD assessment:    Antithrombotic medications on inpatient or PTA profile prior to procedure:   SQ heparin    Recommended restart time per IR Post-Procedure Guidelines:  Day 0 (at least 4 hours or at next standard dose interaval)  Other considerations:      Plan:    Resume heparin 5000 units sq q8h at 22:00 tonight   Peggyann Juba, PharmD, BCPS Pager: [phone] 05/04/2016 4:42 PM

## 2016-05-04 NOTE — Progress Notes (Signed)
Referring Physician(s): Gross,S  Supervising Physician: Corrie Mckusick  Patient Status:  [facility] - In-pt  Chief Complaint: Cholecystitis  Subjective: Patient is known to IR service for prior port a cath placement. Presents with RUQ pain with known cholecystitis. Pain has been intermittent for the past 2 years and radiates into back. Pain is worse with food. General surgery classified her as high risk for cholecystectomy. She denies any fevers, chills, nausea, vomiting, or changes in bowels.  Request received for percutaneous cholecystostomy. Past Medical History:  Diagnosis Date  . Cancer (HCC)    squamous cell  . Colon carcinoma ([place])    dx. left Colon cancer,evidence of metastasis  . Complication of anesthesia    "morphine caused severe shakes"  . GERD (gastroesophageal reflux disease)   . Macular degeneration   . Pneumonia 04/2012  . PONV (postoperative nausea and vomiting)   . Rheumatoid Arthritis    Past Surgical History:  Procedure Laterality Date  . CATARACT EXTRACTION, BILATERAL    . Cataracts    . COLONOSCOPY WITH PROPOFOL N/A 04/30/2015   Procedure: COLONOSCOPY WITH PROPOFOL;  Surgeon: Milus Banister, MD;  Location: [facility] ENDOSCOPY;  Service: Endoscopy;  Laterality: N/A;  . ESOPHAGOGASTRODUODENOSCOPY N/A 01/22/2015   Procedure: ESOPHAGOGASTRODUODENOSCOPY (EGD);  Surgeon: Milus Banister, MD;  Location: [facility];  Service: Endoscopy;  Laterality: N/A;  . EYE SURGERY     bilateral cataracts  . HAND SURGERY     Pt states she had both hands operated on for arthritis.  Marland Kitchen HAND SURGERY    . HEMORRHOID SURGERY    . IR GENERIC HISTORICAL  02/12/2016   IR US GUIDE VASC ACCESS RIGHT 02/12/2016 Corrie Mckusick, DO [facility]  . IR GENERIC HISTORICAL  02/12/2016   IR FLUORO GUIDE PORT INSERTION RIGHT 02/12/2016 Corrie Mckusick, DO [facility]  . JOINT REPLACEMENT    . MINOR HEMORRHOIDECTOMY    . SHOULDER ARTHROSCOPY Left   . SHOULDER SURGERY Left    arthroscopy  .  TONSILLECTOMY    . TOTAL KNEE ARTHROPLASTY     x 2  . TOTAL KNEE ARTHROPLASTY Right   . TOTAL KNEE ARTHROPLASTY Left      Allergies: Aspirin; Codeine; Morphine and related; and Penicillins  Medications: Prior to Admission medications   Medication Sig Start Date End Date Taking? Authorizing Provider  acetaminophen (TYLENOL) 500 MG tablet Take 1,000 mg by mouth every 6 (six) hours as needed for mild pain or moderate pain.    Yes Historical Provider, MD  HYDROmorphone (DILAUDID) 2 MG tablet Take 0.5-1 tablets (1-2 mg total) by mouth every 4 (four) hours as needed for severe pain. 04/29/16  Yes Truitt Merle, MD  lidocaine-prilocaine (EMLA) cream Apply 1 application topically as needed. Patient taking differently: Apply 1 application topically daily as needed (port access).  04/08/16  Yes Owens Shark, NP  ondansetron (ZOFRAN) 8 MG tablet Take 1 tablet (8 mg total) by mouth every 8 (eight) hours as needed for nausea or vomiting. 04/29/16  Yes Truitt Merle, MD  Polyethyl Glycol-Propyl Glycol (SYSTANE OP) Apply 1-2 drops to eye 4 (four) times daily as needed (for dry eye).   Yes Historical Provider, MD  traMADol (ULTRAM) 50 MG tablet Take 1 tablet (50 mg total) by mouth every 8 (eight) hours as needed. Patient taking differently: Take 50 mg by mouth every 8 (eight) hours as needed for moderate pain or severe pain.  04/08/16  Yes Owens Shark, NP  ondansetron (ZOFRAN ODT) 4 MG  disintegrating tablet Take 1 tablet (4 mg total) by mouth every 8 (eight) hours as needed for nausea or vomiting. Patient not taking: Reported on 05/03/2016 04/25/16   Carlisle Cater, PA-C     Vital Signs: BP (!) 103/57 (BP Location: Right Arm)   Pulse 65   Temp 98.9 F (37.2 C) (Oral)   Resp 18   Ht 5\' 4"  (1.626 m)   Wt 122 lb (55.3 kg)   SpO2 99%   BMI 20.94 kg/m   Physical Exam : Awake and alert and in no acute distress. Heart is regular rate and rhythm. Lungs are clear to auscultation bilaterally. Bowel sounds are intact.  Significant tenderness to the RUQ. No edema in extremities.   Imaging: Nm Hepatobiliary Liver Func  Result Date: 05/04/2016 CLINICAL DATA:  Abnormal LFTs. Chronic worsening right upper quadrant abdominal pain. Initial encounter. EXAM: NUCLEAR MEDICINE HEPATOBILIARY IMAGING TECHNIQUE: Sequential images of the abdomen were obtained out to 60 minutes following intravenous administration of radiopharmaceutical. The gallbladder was not visualized. As the patient has an allergy to morphine, slow intravenous infusion of 1.1 mcg of cholecystokinin was performed. RADIOPHARMACEUTICALS:  5.05 mCi Tc-82m Choletec IV COMPARISON:  None. FINDINGS: Prompt uptake and biliary excretion of activity by the liver is seen. Biliary activity passes into small bowel, consistent with patent common bile duct. The gallbladder is not visualized at 45 minutes after administration of radiopharmaceutical. As the patient has an allergy to morphine, slow intravenous infusion of cholecystokinin was performed. The gallbladder was not visualized at 1 hour following initiation of infusion. This is most compatible with acute cholecystitis, though in some cases, chronic cholecystitis can have a similar appearance. IMPRESSION: Prompt uptake and biliary excretion of activity by the liver. Gallbladder not visualized, even after infusion of cholecystokinin. This is most compatible with acute cholecystitis, though in some cases, chronic cholecystitis can have a similar appearance. Electronically Signed   By: Garald Balding M.D.   On: 05/04/2016 02:38   US Abdomen Complete  Result Date: 05/03/2016 CLINICAL DATA:  Abdominal pain EXAM: ABDOMEN ULTRASOUND COMPLETE COMPARISON:  04/25/2016 FINDINGS: Gallbladder: Multiple gallstones are again identified stable from the prior CT examination. No gallbladder wall thickening or pericholecystic fluid is noted. Common bile duct: Diameter: Dilated at 11 mm. This is similar to that seen on prior CT examination.  Liver: No focal lesion identified. Within normal limits in parenchymal echogenicity. IVC: No abnormality visualized. Pancreas: Visualized portion unremarkable. Spleen: Size and appearance within normal limits. Right Kidney: Length: 9.4 cm. Echogenicity within normal limits. No mass or hydronephrosis visualized. Left Kidney: Length: 9.3 cm. Echogenicity within normal limits. No mass or hydronephrosis visualized. Abdominal aorta: No aneurysm visualized. Other findings: None. IMPRESSION: Cholelithiasis. Common bile duct dilatation stable from the previous exam. Correlation with bilirubin level is recommended. Electronically Signed   By: Inez Catalina M.D.   On: 05/03/2016 15:06   Mr 3d Recon At Scanner  Result Date: 05/03/2016 CLINICAL DATA:  83 year old female with history of colon cancer. Two year history of intermittent right upper quadrant abdominal pain. EXAM: MRI ABDOMEN WITHOUT AND WITH CONTRAST (INCLUDING MRCP) TECHNIQUE: Multiplanar multisequence MR imaging of the abdomen was performed both before and after the administration of intravenous contrast. Heavily T2-weighted images of the biliary and pancreatic ducts were obtained, and three-dimensional MRCP images were rendered by post processing. CONTRAST:  24mL MULTIHANCE GADOBENATE DIMEGLUMINE 529 MG/ML IV SOLN COMPARISON:  CT scan 04/25/2016 and abdominal ultrasound 05/03/2016. FINDINGS: Lower chest: The lung bases demonstrate chronic basilar scarring and  emphysema. No effusions or pulmonary lesions. No pericardial effusion. Hepatobiliary: No focal hepatic lesions are identified. Geographic fatty infiltration is noted. There is stable intra and extrahepatic biliary dilatation. The gallbladder is filled with gallstones. The common bile duct is dilated to a maximum of 13.5 mm in the porta hepatis and 11.5 mm in the head of the pancreas. This is a stable finding. No common bile duct stones are identified. Pancreas:  No mass, inflammation or ductal dilatation.  Spleen:  Normal size.  No focal lesions. Adrenals/Urinary Tract: The adrenal glands and kidneys are unremarkable. Lower pole right renal cyst is noted. Stomach/Bowel: The stomach, duodenum, visualized small bowel and visualize colon are grossly normal. Vascular/Lymphatic: Stable atherosclerotic calcifications involving the aorta and branch vessels but no aneurysm or dissection. Small scattered mesenteric and retroperitoneal lymph nodes but no mass or overt adenopathy. Other:  No ascites or abdominal wall hernia. Musculoskeletal: No significant bony findings. IMPRESSION: 1. Cholelithiasis but no findings suspicious for acute cholecystitis and no common bile duct stones. Chronic intra and extrahepatic biliary dilatation. 2. Normal MR appearance of the pancreas. Normal caliber and course of the main pancreatic duct. Electronically Signed   By: Marijo Sanes M.D.   On: 05/03/2016 21:18   Mr Abdomen Mrcp Moise Boring Contast  Result Date: 05/03/2016 CLINICAL DATA:  83 year old female with history of colon cancer. Two year history of intermittent right upper quadrant abdominal pain. EXAM: MRI ABDOMEN WITHOUT AND WITH CONTRAST (INCLUDING MRCP) TECHNIQUE: Multiplanar multisequence MR imaging of the abdomen was performed both before and after the administration of intravenous contrast. Heavily T2-weighted images of the biliary and pancreatic ducts were obtained, and three-dimensional MRCP images were rendered by post processing. CONTRAST:  36mL MULTIHANCE GADOBENATE DIMEGLUMINE 529 MG/ML IV SOLN COMPARISON:  CT scan 04/25/2016 and abdominal ultrasound 05/03/2016. FINDINGS: Lower chest: The lung bases demonstrate chronic basilar scarring and emphysema. No effusions or pulmonary lesions. No pericardial effusion. Hepatobiliary: No focal hepatic lesions are identified. Geographic fatty infiltration is noted. There is stable intra and extrahepatic biliary dilatation. The gallbladder is filled with gallstones. The common bile duct is  dilated to a maximum of 13.5 mm in the porta hepatis and 11.5 mm in the head of the pancreas. This is a stable finding. No common bile duct stones are identified. Pancreas:  No mass, inflammation or ductal dilatation. Spleen:  Normal size.  No focal lesions. Adrenals/Urinary Tract: The adrenal glands and kidneys are unremarkable. Lower pole right renal cyst is noted. Stomach/Bowel: The stomach, duodenum, visualized small bowel and visualize colon are grossly normal. Vascular/Lymphatic: Stable atherosclerotic calcifications involving the aorta and branch vessels but no aneurysm or dissection. Small scattered mesenteric and retroperitoneal lymph nodes but no mass or overt adenopathy. Other:  No ascites or abdominal wall hernia. Musculoskeletal: No significant bony findings. IMPRESSION: 1. Cholelithiasis but no findings suspicious for acute cholecystitis and no common bile duct stones. Chronic intra and extrahepatic biliary dilatation. 2. Normal MR appearance of the pancreas. Normal caliber and course of the main pancreatic duct. Electronically Signed   By: Marijo Sanes M.D.   On: 05/03/2016 21:18    Labs:  CBC:  Recent Labs  04/24/16 2214 05/01/16 0255 05/03/16 1220 05/04/16 1013  WBC 6.3 5.9 8.2 4.2  HGB 11.0* 10.8* 11.2* 9.2*  HCT 33.6* 33.3* 34.9* 29.4*  PLT 250 292 284 185    COAGS:  Recent Labs  02/12/16 1135 05/04/16 1013  INR 0.94 1.06    BMP:  Recent Labs  04/24/16 2214  05/01/16 0255 05/03/16 1220 05/04/16 1013  NA 138 136 136 138  K 4.1 4.3 4.3 3.8  CL 108 104 102 107  CO2 23 23 27 26   GLUCOSE 117* 109* 109* 102*  BUN 19 18 21* 16  CALCIUM 8.7* 8.3* 8.6* 7.9*  CREATININE 1.00 0.68 0.95 0.83  GFRNONAA 48* >60 51* >60  GFRAA 56* >60 59* >60    LIVER FUNCTION TESTS:  Recent Labs  04/24/16 2214 05/01/16 0255 05/03/16 1220 05/04/16 1013  BILITOT 0.5 0.3 0.8 0.6  AST 26 26 431* 237*  ALT 50 43 143* 254*  ALKPHOS 342* 314* 550* 462*  PROT 6.6 6.3* 6.5 5.8*   ALBUMIN 3.6 3.1* 3.3* 2.9*    Assessment and Plan: Acute cholecystitis based on ultrasound/NM study and laboratory findings. Patient is unstable for cholecystectomy and will have her scheduled for a percutaneous cholecystostomy with drain. Imaging studies have been reviewed by Dr. Earleen Newport and patient appears to be candidate for cholecystostomy. Risks and benefits discussed with the patient/son including, but not limited to bleeding, infection, gallbladder perforation, bile leak, sepsis or even death.All of the patient's questions were answered, patient is agreeable to proceed.Consent signed and in chart.Procedure scheduled for later today. GI and [name] aware of plans.    Electronically Signed: D. Rowe Robert 05/04/2016, 11:20 AM   I spent a total of 30 minutes at the the patient's bedside AND on the patient's hospital floor or unit, greater than 50% of which was counseling/coordinating care for percutaneous cholecystostomy    Patient ID: Joann Herrera, female   DOB: 12-26-1923, 83 y.o.   MRN: [id number]

## 2016-05-04 NOTE — Progress Notes (Signed)
LCSWA met with patient son at bedside, explained reason for consult to assist with SNF placement. Patient son his plan is for patient to discharge to his home, hopefully with homehealth.  LCSWA informed RNCM. No other needs identified at this time.[place] signing off. Please reconsult if needed.   Kathrin Greathouse, Latanya Presser, MSW Clinical Social Worker 5E and Psych[facility] Service Line [phone] 05/04/2016  2:14 PM

## 2016-05-04 NOTE — Progress Notes (Signed)
Joann Herrera   DOB:1923/06/05   [id number]   [id number]  Oncology follow-up note  Subjective: Patient is well-known to me, on palliative protrusion for her metastatic colon cancer. She was admitted to hospital on 1/9 for worsening of abdominal pain. She states her pain has improved.  Objective:  Vitals:   05/04/16 1615 05/04/16 2121  BP: (!) 116/50 112/61  Pulse: 64 (!) 58  Resp: 15 16  Temp:  97.6 F (36.4 C)    Body mass index is 20.94 kg/m.  Intake/Output Summary (Last 24 hours) at 05/04/16 2340 Last data filed at 05/04/16 1900  Gross per 24 hour  Intake          2336.67 ml  Output                5 ml  Net          2331.67 ml     Sclerae unicteric  Oropharynx clear  No peripheral adenopathy  Lungs clear -- no rales or rhonchi  Heart regular rate and rhythm  Abdomen soft, (+) RUQ tenderness   MSK no focal spinal tenderness, no peripheral edema  Neuro nonfocal   CBG (last 3)  No results for input(s): GLUCAP in the last 72 hours.   Labs:  Lab Results  Component Value Date   WBC 4.2 05/04/2016   HGB 9.2 (L) 05/04/2016   HCT 29.4 (L) 05/04/2016   MCV 86.7 05/04/2016   PLT 185 05/04/2016   NEUTROABS 6.0 05/03/2016    CMP Latest Ref Rng & Units 05/04/2016 05/03/2016 05/01/2016  Glucose 65 - 99 mg/dL 102(H) 109(H) 109(H)  BUN 6 - 20 mg/dL 16 21(H) 18  Creatinine 0.44 - 1.00 mg/dL 0.83 0.95 0.68  Sodium 135 - 145 mmol/L 138 136 136  Potassium 3.5 - 5.1 mmol/L 3.8 4.3 4.3  Chloride 101 - 111 mmol/L 107 102 104  CO2 22 - 32 mmol/L 26 27 23   Calcium 8.9 - 10.3 mg/dL 7.9(L) 8.6(L) 8.3(L)  Total Protein 6.5 - 8.1 g/dL 5.8(L) 6.5 6.3(L)  Total Bilirubin 0.3 - 1.2 mg/dL 0.6 0.8 0.3  Alkaline Phos 38 - 126 U/L 462(H) 550(H) 314(H)  AST 15 - 41 U/L 237(H) 431(H) 26  ALT 14 - 54 U/L 254(H) 143(H) 43     Urine Studies No results for input(s): UHGB, CRYS in the last 72 hours.  Invalid input(s): UACOL, UAPR, USPG, UPH, UTP, UGL, UKET, UBIL, UNIT, UROB, ULEU,  UEPI, UWBC, URBC, UBAC, CAST, [place], [place]  Basic Metabolic Panel:  Recent Labs Lab 05/01/16 0255 05/03/16 1220 05/04/16 1013  NA 136 136 138  K 4.3 4.3 3.8  CL 104 102 107  CO2 23 27 26   GLUCOSE 109* 109* 102*  BUN 18 21* 16  CREATININE 0.68 0.95 0.83  CALCIUM 8.3* 8.6* 7.9*   GFR Estimated Creatinine Clearance: 38.9 mL/min (by C-G formula based on SCr of 0.83 mg/dL). Liver Function Tests:  Recent Labs Lab 05/01/16 0255 05/03/16 1220 05/04/16 1013  AST 26 431* 237*  ALT 43 143* 254*  ALKPHOS 314* 550* 462*  BILITOT 0.3 0.8 0.6  PROT 6.3* 6.5 5.8*  ALBUMIN 3.1* 3.3* 2.9*    Recent Labs Lab 05/01/16 0255 05/03/16 1220  LIPASE 83* 83*   No results for input(s): AMMONIA in the last 168 hours. Coagulation profile  Recent Labs Lab 05/04/16 1013  INR 1.06    CBC:  Recent Labs Lab 05/01/16 0255 05/03/16 1220 05/04/16 1013  WBC 5.9 8.2 4.2  NEUTROABS 3.2 6.0  --   HGB 10.8* 11.2* 9.2*  HCT 33.3* 34.9* 29.4*  MCV 88.1 86.4 86.7  PLT 292 284 185   Cardiac Enzymes: No results for input(s): CKTOTAL, CKMB, CKMBINDEX, TROPONINI in the last 168 hours. BNP: Invalid input(s): POCBNP CBG: No results for input(s): GLUCAP in the last 168 hours. D-Dimer No results for input(s): DDIMER in the last 72 hours. Hgb A1c No results for input(s): HGBA1C in the last 72 hours. Lipid Profile No results for input(s): CHOL, HDL, LDLCALC, TRIG, CHOLHDL, LDLDIRECT in the last 72 hours. Thyroid function studies No results for input(s): TSH, T4TOTAL, T3FREE, THYROIDAB in the last 72 hours.  Invalid input(s): FREET3 Anemia work up No results for input(s): VITAMINB12, FOLATE, FERRITIN, TIBC, IRON, RETICCTPCT in the last 72 hours. Microbiology Recent Results (from the past 240 hour(s))  Aerobic/Anaerobic Culture (surgical/deep wound)     Status: None (Preliminary result)   Collection Time: 05/04/16  4:21 PM  Result Value Ref Range Status   Specimen Description GALL  BLADDER BILE  Final   Special Requests NONE  Final   Gram Stain   Final    ABUNDANT WBC PRESENT, PREDOMINANTLY PMN RARE GRAM POSITIVE COCCI IN PAIRS Performed at [facility]    Culture PENDING  Incomplete   Report Status PENDING  Incomplete      Studies:  Nm Hepatobiliary Liver Func  Result Date: 05/04/2016 CLINICAL DATA:  Abnormal LFTs. Chronic worsening right upper quadrant abdominal pain. Initial encounter. EXAM: NUCLEAR MEDICINE HEPATOBILIARY IMAGING TECHNIQUE: Sequential images of the abdomen were obtained out to 60 minutes following intravenous administration of radiopharmaceutical. The gallbladder was not visualized. As the patient has an allergy to morphine, slow intravenous infusion of 1.1 mcg of cholecystokinin was performed. RADIOPHARMACEUTICALS:  5.05 mCi Tc-75m Choletec IV COMPARISON:  None. FINDINGS: Prompt uptake and biliary excretion of activity by the liver is seen. Biliary activity passes into small bowel, consistent with patent common bile duct. The gallbladder is not visualized at 45 minutes after administration of radiopharmaceutical. As the patient has an allergy to morphine, slow intravenous infusion of cholecystokinin was performed. The gallbladder was not visualized at 1 hour following initiation of infusion. This is most compatible with acute cholecystitis, though in some cases, chronic cholecystitis can have a similar appearance. IMPRESSION: Prompt uptake and biliary excretion of activity by the liver. Gallbladder not visualized, even after infusion of cholecystokinin. This is most compatible with acute cholecystitis, though in some cases, chronic cholecystitis can have a similar appearance. Electronically Signed   By: Garald Balding M.D.   On: 05/04/2016 02:38   US Abdomen Complete  Result Date: 05/03/2016 CLINICAL DATA:  Abdominal pain EXAM: ABDOMEN ULTRASOUND COMPLETE COMPARISON:  04/25/2016 FINDINGS: Gallbladder: Multiple gallstones are again identified  stable from the prior CT examination. No gallbladder wall thickening or pericholecystic fluid is noted. Common bile duct: Diameter: Dilated at 11 mm. This is similar to that seen on prior CT examination. Liver: No focal lesion identified. Within normal limits in parenchymal echogenicity. IVC: No abnormality visualized. Pancreas: Visualized portion unremarkable. Spleen: Size and appearance within normal limits. Right Kidney: Length: 9.4 cm. Echogenicity within normal limits. No mass or hydronephrosis visualized. Left Kidney: Length: 9.3 cm. Echogenicity within normal limits. No mass or hydronephrosis visualized. Abdominal aorta: No aneurysm visualized. Other findings: None. IMPRESSION: Cholelithiasis. Common bile duct dilatation stable from the previous exam. Correlation with bilirubin level is recommended. Electronically Signed   By: Inez Catalina M.D.   On: 05/03/2016 15:06  Mr 3d Recon At Scanner  Result Date: 05/03/2016 CLINICAL DATA:  83 year old female with history of colon cancer. Two year history of intermittent right upper quadrant abdominal pain. EXAM: MRI ABDOMEN WITHOUT AND WITH CONTRAST (INCLUDING MRCP) TECHNIQUE: Multiplanar multisequence MR imaging of the abdomen was performed both before and after the administration of intravenous contrast. Heavily T2-weighted images of the biliary and pancreatic ducts were obtained, and three-dimensional MRCP images were rendered by post processing. CONTRAST:  41mL MULTIHANCE GADOBENATE DIMEGLUMINE 529 MG/ML IV SOLN COMPARISON:  CT scan 04/25/2016 and abdominal ultrasound 05/03/2016. FINDINGS: Lower chest: The lung bases demonstrate chronic basilar scarring and emphysema. No effusions or pulmonary lesions. No pericardial effusion. Hepatobiliary: No focal hepatic lesions are identified. Geographic fatty infiltration is noted. There is stable intra and extrahepatic biliary dilatation. The gallbladder is filled with gallstones. The common bile duct is dilated to a  maximum of 13.5 mm in the porta hepatis and 11.5 mm in the head of the pancreas. This is a stable finding. No common bile duct stones are identified. Pancreas:  No mass, inflammation or ductal dilatation. Spleen:  Normal size.  No focal lesions. Adrenals/Urinary Tract: The adrenal glands and kidneys are unremarkable. Lower pole right renal cyst is noted. Stomach/Bowel: The stomach, duodenum, visualized small bowel and visualize colon are grossly normal. Vascular/Lymphatic: Stable atherosclerotic calcifications involving the aorta and branch vessels but no aneurysm or dissection. Small scattered mesenteric and retroperitoneal lymph nodes but no mass or overt adenopathy. Other:  No ascites or abdominal wall hernia. Musculoskeletal: No significant bony findings. IMPRESSION: 1. Cholelithiasis but no findings suspicious for acute cholecystitis and no common bile duct stones. Chronic intra and extrahepatic biliary dilatation. 2. Normal MR appearance of the pancreas. Normal caliber and course of the main pancreatic duct. Electronically Signed   By: Marijo Sanes M.D.   On: 05/03/2016 21:18   Ir Perc Cholecystostomy  Result Date: 05/04/2016 INDICATION: 83 year old female with acute cholecystitis EXAM: CHOLECYSTOSTOMY MEDICATIONS: Patient is receiving Cipro in the hospital; The antibiotic was administered within an appropriate time frame prior to the initiation of the procedure. ANESTHESIA/SEDATION: Moderate (conscious) sedation was employed during this procedure. A total of Versed 2 point mg and Fentanyl 50 mcg was administered intravenously. Moderate Sedation Time: 20 minutes. The patient's level of consciousness and vital signs were monitored continuously by radiology nursing throughout the procedure under my direct supervision. FLUOROSCOPY TIME:  Fluoroscopy Time: 1 minutes 18 seconds (7.4 mGy). COMPLICATIONS: None PROCEDURE: Informed written consent was obtained from the patient and the patient's family after a  thorough discussion of the procedural risks, benefits and alternatives. All questions were addressed. Maximal Sterile Barrier Technique was utilized including caps, mask, sterile gowns, sterile gloves, sterile drape, hand hygiene and skin antiseptic. A timeout was performed prior to the initiation of the procedure. Ultrasound survey of the right upper quadrant was performed for planning purposes. Once the patient is prepped and draped in the usual sterile fashion, the skin and subcutaneous tissues overlying the gallbladder were generously infiltrated 1% lidocaine for local anesthesia. A coaxial needle was advanced under ultrasound guidance through the skin subcutaneous tissues and a small segment of liver into the gallbladder lumen. With removal of the stylet, spontaneous dark bile drainage occurred. Using modified Seldinger technique, a 10 French drain was placed into the gallbladder fossa, with aspiration of the sample for the lab. Contrast injection confirmed position of the tube within the gallbladder lumen. Drainage catheter was attached to gravity drain with a suture retention placed. Patient  tolerated the procedure well and remained hemodynamically stable throughout. No complications were encountered and no significant blood loss encountered. IMPRESSION: Status post percutaneous cholecystostomy. Signed, Dulcy Fanny. Earleen Newport, DO [facility] Radiology Electronically Signed   By: Corrie Mckusick D.O.   On: 05/04/2016 16:32   Mr Abdomen Mrcp Moise Boring Contast  Result Date: 05/03/2016 CLINICAL DATA:  83 year old female with history of colon cancer. Two year history of intermittent right upper quadrant abdominal pain. EXAM: MRI ABDOMEN WITHOUT AND WITH CONTRAST (INCLUDING MRCP) TECHNIQUE: Multiplanar multisequence MR imaging of the abdomen was performed both before and after the administration of intravenous contrast. Heavily T2-weighted images of the biliary and pancreatic  ducts were obtained, and three-dimensional MRCP images were rendered by post processing. CONTRAST:  32mL MULTIHANCE GADOBENATE DIMEGLUMINE 529 MG/ML IV SOLN COMPARISON:  CT scan 04/25/2016 and abdominal ultrasound 05/03/2016. FINDINGS: Lower chest: The lung bases demonstrate chronic basilar scarring and emphysema. No effusions or pulmonary lesions. No pericardial effusion. Hepatobiliary: No focal hepatic lesions are identified. Geographic fatty infiltration is noted. There is stable intra and extrahepatic biliary dilatation. The gallbladder is filled with gallstones. The common bile duct is dilated to a maximum of 13.5 mm in the porta hepatis and 11.5 mm in the head of the pancreas. This is a stable finding. No common bile duct stones are identified. Pancreas:  No mass, inflammation or ductal dilatation. Spleen:  Normal size.  No focal lesions. Adrenals/Urinary Tract: The adrenal glands and kidneys are unremarkable. Lower pole right renal cyst is noted. Stomach/Bowel: The stomach, duodenum, visualized small bowel and visualize colon are grossly normal. Vascular/Lymphatic: Stable atherosclerotic calcifications involving the aorta and branch vessels but no aneurysm or dissection. Small scattered mesenteric and retroperitoneal lymph nodes but no mass or overt adenopathy. Other:  No ascites or abdominal wall hernia. Musculoskeletal: No significant bony findings. IMPRESSION: 1. Cholelithiasis but no findings suspicious for acute cholecystitis and no common bile duct stones. Chronic intra and extrahepatic biliary dilatation. 2. Normal MR appearance of the pancreas. Normal caliber and course of the main pancreatic duct. Electronically Signed   By: Marijo Sanes M.D.   On: 05/03/2016 21:18    Assessment: 83 y.o. with metastatic colon cancer, on Keytruda   1. Cholelithiasis 2. Metastatic colon cancer to peritoneum 3. Transaminitis 4. Abdominal pain  5. Moderate Malnutrition   Plan:  -I spoke with [name], who  is very concerned her surgical risk for cholecystectomy, and he recommends percutaneous gallbladder drainage. IR was consulted and plan to do it today  -supportive care with IVF and pain control -I spoke with pt and her son Elta Guadeloupe at bedside, discussed the above, and overall palliative approach, due to advanced age and underlying metastatic colon cancer, she agrees  -I will follow up as needed  -will hold Howard for now    Truitt Merle, MD 05/04/2016

## 2016-05-04 NOTE — Progress Notes (Signed)
[facility] Progress Note     Subjective: Denies abdominal pain. Denies nausea/vomiting.  Son is at bedside this AM.  Objective: Vital signs in last 24 hours: Temp:  [98.9 F (37.2 C)-99.6 F (37.6 C)] 98.9 F (37.2 C) (01/10 0518) Pulse Rate:  [65-77] 65 (01/10 0518) Resp:  [14-18] 18 (01/10 0518) BP: (102-123)/(44-97) 103/57 (01/10 0518) SpO2:  [91 %-99 %] 99 % (01/10 0518) Last BM Date: 05/04/16  Intake/Output from previous day: 01/09 0701 - 01/10 0700 In: 936.7 [I.V.:936.7] Out: -  Intake/Output this shift: No intake/output data recorded.  PE: Gen:  Alert, NAD, pleasant Card:  RRR Pulm:  CTAB Abd: Soft, right hemi-abdomen mildly tender, +BS Extremities: non-tender, no edema  Lab Results:  Hepatic Function Latest Ref Rng & Units 05/04/2016 05/03/2016 05/01/2016  Total Protein 6.5 - 8.1 g/dL 9.2(B) 6.5 6.3(L)  Albumin 3.5 - 5.0 g/dL 2.9(L) 3.3(L) 3.1(L)  AST 15 - 41 U/L 237(H) 431(H) 26  ALT 14 - 54 U/L 254(H) 143(H) 43  Alk Phosphatase 38 - 126 U/L 462(H) 550(H) 314(H)  Total Bilirubin 0.3 - 1.2 mg/dL 0.6 0.8 0.3  Bilirubin, Direct 0.0 - 0.3 mg/dL - - -    Recent Labs  88/21/36 1220 05/04/16 1013  WBC 8.2 4.2  HGB 11.2* 9.2*  HCT 34.9* 29.4*  PLT 284 185   BMET  Recent Labs  05/03/16 1220 05/04/16 1013  NA 136 138  K 4.3 3.8  CL 102 107  CO2 27 26  GLUCOSE 109* 102*  BUN 21* 16  CREATININE 0.95 0.83  CALCIUM 8.6* 7.9*        Studies/Results: Nm Hepatobiliary Liver Func  Result Date: 05/04/2016 CLINICAL DATA:  Abnormal LFTs. Chronic worsening right upper quadrant abdominal pain. Initial encounter. EXAM: NUCLEAR MEDICINE HEPATOBILIARY IMAGING TECHNIQUE: Sequential images of the abdomen were obtained out to 60 minutes following intravenous administration of radiopharmaceutical. The gallbladder was not visualized. As the patient has an allergy to morphine, slow intravenous infusion of 1.1 mcg of cholecystokinin was performed.  RADIOPHARMACEUTICALS:  5.05 mCi Tc-35m Choletec IV COMPARISON:  None. FINDINGS: Prompt uptake and biliary excretion of activity by the liver is seen. Biliary activity passes into small bowel, consistent with patent common bile duct. The gallbladder is not visualized at 45 minutes after administration of radiopharmaceutical. As the patient has an allergy to morphine, slow intravenous infusion of cholecystokinin was performed. The gallbladder was not visualized at 1 hour following initiation of infusion. This is most compatible with acute cholecystitis, though in some cases, chronic cholecystitis can have a similar appearance. IMPRESSION: Prompt uptake and biliary excretion of activity by the liver. Gallbladder not visualized, even after infusion of cholecystokinin. This is most compatible with acute cholecystitis, though in some cases, chronic cholecystitis can have a similar appearance. Electronically Signed   By: Roanna Raider M.D.   On: 05/04/2016 02:38   US Abdomen Complete  Result Date: 05/03/2016 CLINICAL DATA:  Abdominal pain EXAM: ABDOMEN ULTRASOUND COMPLETE COMPARISON:  04/25/2016 FINDINGS: Gallbladder: Multiple gallstones are again identified stable from the prior CT examination. No gallbladder wall thickening or pericholecystic fluid is noted. Common bile duct: Diameter: Dilated at 11 mm. This is similar to that seen on prior CT examination. Liver: No focal lesion identified. Within normal limits in parenchymal echogenicity. IVC: No abnormality visualized. Pancreas: Visualized portion unremarkable. Spleen: Size and appearance within normal limits. Right Kidney: Length: 9.4 cm. Echogenicity within normal limits. No mass or hydronephrosis visualized. Left Kidney: Length: 9.3 cm. Echogenicity within  normal limits. No mass or hydronephrosis visualized. Abdominal aorta: No aneurysm visualized. Other findings: None. IMPRESSION: Cholelithiasis. Common bile duct dilatation stable from the previous exam.  Correlation with bilirubin level is recommended. Electronically Signed   By: Inez Catalina M.D.   On: 05/03/2016 15:06   Mr 3d Recon At Scanner  Result Date: 05/03/2016 CLINICAL DATA:  83 year old female with history of colon cancer. Two year history of intermittent right upper quadrant abdominal pain. EXAM: MRI ABDOMEN WITHOUT AND WITH CONTRAST (INCLUDING MRCP) TECHNIQUE: Multiplanar multisequence MR imaging of the abdomen was performed both before and after the administration of intravenous contrast. Heavily T2-weighted images of the biliary and pancreatic ducts were obtained, and three-dimensional MRCP images were rendered by post processing. CONTRAST:  42m MULTIHANCE GADOBENATE DIMEGLUMINE 529 MG/ML IV SOLN COMPARISON:  CT scan 04/25/2016 and abdominal ultrasound 05/03/2016. FINDINGS: Lower chest: The lung bases demonstrate chronic basilar scarring and emphysema. No effusions or pulmonary lesions. No pericardial effusion. Hepatobiliary: No focal hepatic lesions are identified. Geographic fatty infiltration is noted. There is stable intra and extrahepatic biliary dilatation. The gallbladder is filled with gallstones. The common bile duct is dilated to a maximum of 13.5 mm in the porta hepatis and 11.5 mm in the head of the pancreas. This is a stable finding. No common bile duct stones are identified. Pancreas:  No mass, inflammation or ductal dilatation. Spleen:  Normal size.  No focal lesions. Adrenals/Urinary Tract: The adrenal glands and kidneys are unremarkable. Lower pole right renal cyst is noted. Stomach/Bowel: The stomach, duodenum, visualized small bowel and visualize colon are grossly normal. Vascular/Lymphatic: Stable atherosclerotic calcifications involving the aorta and branch vessels but no aneurysm or dissection. Small scattered mesenteric and retroperitoneal lymph nodes but no mass or overt adenopathy. Other:  No ascites or abdominal wall hernia. Musculoskeletal: No significant bony findings.  IMPRESSION: 1. Cholelithiasis but no findings suspicious for acute cholecystitis and no common bile duct stones. Chronic intra and extrahepatic biliary dilatation. 2. Normal MR appearance of the pancreas. Normal caliber and course of the main pancreatic duct. Electronically Signed   By: PMarijo SanesM.D.   On: 05/03/2016 21:18   Mr Abdomen Mrcp WMoise BoringContast  Result Date: 05/03/2016 CLINICAL DATA:  83year old female with history of colon cancer. Two year history of intermittent right upper quadrant abdominal pain. EXAM: MRI ABDOMEN WITHOUT AND WITH CONTRAST (INCLUDING MRCP) TECHNIQUE: Multiplanar multisequence MR imaging of the abdomen was performed both before and after the administration of intravenous contrast. Heavily T2-weighted images of the biliary and pancreatic ducts were obtained, and three-dimensional MRCP images were rendered by post processing. CONTRAST:  147mMULTIHANCE GADOBENATE DIMEGLUMINE 529 MG/ML IV SOLN COMPARISON:  CT scan 04/25/2016 and abdominal ultrasound 05/03/2016. FINDINGS: Lower chest: The lung bases demonstrate chronic basilar scarring and emphysema. No effusions or pulmonary lesions. No pericardial effusion. Hepatobiliary: No focal hepatic lesions are identified. Geographic fatty infiltration is noted. There is stable intra and extrahepatic biliary dilatation. The gallbladder is filled with gallstones. The common bile duct is dilated to a maximum of 13.5 mm in the porta hepatis and 11.5 mm in the head of the pancreas. This is a stable finding. No common bile duct stones are identified. Pancreas:  No mass, inflammation or ductal dilatation. Spleen:  Normal size.  No focal lesions. Adrenals/Urinary Tract: The adrenal glands and kidneys are unremarkable. Lower pole right renal cyst is noted. Stomach/Bowel: The stomach, duodenum, visualized small bowel and visualize colon are grossly normal. Vascular/Lymphatic: Stable atherosclerotic calcifications involving the  aorta and branch  vessels but no aneurysm or dissection. Small scattered mesenteric and retroperitoneal lymph nodes but no mass or overt adenopathy. Other:  No ascites or abdominal wall hernia. Musculoskeletal: No significant bony findings. IMPRESSION: 1. Cholelithiasis but no findings suspicious for acute cholecystitis and no common bile duct stones. Chronic intra and extrahepatic biliary dilatation. 2. Normal MR appearance of the pancreas. Normal caliber and course of the main pancreatic duct. Electronically Signed   By: Marijo Sanes M.D.   On: 05/03/2016 21:18    Anti-infectives: Anti-infectives    Start     Dose/Rate Route Frequency Ordered Stop   05/04/16 1200  ciprofloxacin (CIPRO) IVPB 400 mg     400 mg 200 mL/hr over 60 Minutes Intravenous Every 12 hours 05/04/16 1056       Assessment/Plan Stage Stage IV colon cancer with porta hepatis adenopathy- currently on Keytruda  Transaminates  Chronic calculous cholecystitis - HIDA 1/9 showed no gallbladder filling high risk sugical candidate given porta hepatis nodes and CBD dilation. IR evaluated and placing a perc cholecystostomy tube - LFTs remain elevated this AM - WBC WNL      LOS: 1 day    Jill Alexanders , [facility] 05/04/2016, 11:49 AM Pager: [phone] Consults: [phone] Mon-Fri 7:00 am-4:30 pm Sat-Sun 7:00 am-11:30 am

## 2016-05-04 NOTE — Progress Notes (Addendum)
PROGRESS NOTE  Joann Herrera SJG:[id number] DOB: July 19, 1923 DOA: 05/03/2016 PCP: [name], MD  Brief History:  83 y.o. female with a history of colon CA with periportal metastases presenting with worsening abdominal pain. She reports about 2 years of waxing/waning RUQ abdominal pain that is severe, aching, radiating to the back, sometimes worse with food. Has had nausea and vomiting in the past, but not on the day of admission. Pain better with tylenol and po dilaudid. Has been eating less today and diffusely weak. She had been seen 4 times in the past 2 weeks for similar complaints.    In the ED she was afebrile, HR 72bpm, BP 121/56, 96% on room air and RUQ tenderness to palpation. LFTs noted to be trending upward, Alk Phos 550, AST 431, ALT 143, TBili 0.8, and lipase 83. WBC normal at 8.2. Abdominal U/S showed cholelithiasis and CBD dilatation stable from recent CT abdomen. GI and general surgery were consulted and [facility] asked to admit.  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 on reduced-dose Beryle Flock. CT 01/29/16 showed stable disease. Recent plan was to schedule PET scan.  - Will add [name] to treatment team. - Consider bowel regimen for chronic constipation once taking po - Previously declined hospice and surgery.  -Distended and S4 consult request for physicals. Discussion as there is no -consult palliative care for ongoing goals of care discussion. -Suspect her subacute worsening weakness is related to  malignancy.  Dehydration -continue IVF  FTT -PT eval -nutrition consult  Constipation -Patient had a bowel movement this morning -needs regular bowel regimen once tolerating po    Disposition Plan:   Home in 2-3 days  Family Communication:   Son updated at bedside 1/10--Total time spent 35 minutes.  Greater than 50% spent face to face counseling and coordinating care.  Consultants:  [facility], [facility], palliative medicine  Code Status:  FULL  DVT Prophylaxis:  Emmet Heparin   Procedures: As Listed in Progress Note Above  Antibiotics: None    Subjective: Patient with abdominal pain. Denies any fevers, chills, chest pain, shortness breath, nausea, vomiting. She had a bowel movement this morning. No dysuria or hematuria.  Objective: Vitals:   05/04/16 1600 05/04/16 1605 05/04/16 1610 05/04/16 1615  BP: (!) 117/57 130/61 (!) 123/54 (!) 116/50  Pulse: 63 65 66 64  Resp: '19 16 18 15  '$ Temp:      TempSrc:      SpO2: 99% 100% 97% 98%  Weight:      Height:        Intake/Output Summary (Last 24 hours) at 05/04/16 1718 Last data filed at 05/04/16 6546  Gross per 24 hour  Intake           936.67 ml  Output                0 ml  Net           936.67 ml   Weight change:  Exam:   General:  Pt is alert, follows commands appropriately, not in acute distress  HEENT: No icterus, No thrush, No neck mass, [place]/AT  Cardiovascular: RRR,  S1/S2, no rubs, no gallops  Respiratory: Bibasilar crackles. No wheezing. Good air movement  Abdomen: Soft/+BS, diffuse tender, non distended, no guarding  Extremities: No edema, No lymphangitis, No petechiae, No rashes, no synovitis   Data Reviewed: I have personally reviewed following labs and imaging studies Basic Metabolic Panel:  Recent Labs Lab 05/01/16 0255 05/03/16 1220 05/04/16 1013  NA 136 136 138  K 4.3 4.3 3.8  CL 104 102 107  CO2 '23 27 26  '$ GLUCOSE 109* 109* 102*  BUN 18 21* 16  CREATININE 0.68 0.95 0.83  CALCIUM  8.3* 8.6* 7.9*   Liver Function Tests:  Recent Labs Lab 05/01/16 0255 05/03/16 1220 05/04/16 1013  AST 26 431* 237*  ALT 43 143* 254*  ALKPHOS 314* 550* 462*  BILITOT 0.3 0.8 0.6  PROT 6.3* 6.5 5.8*  ALBUMIN 3.1* 3.3* 2.9*    Recent Labs Lab 05/01/16 0255 05/03/16 1220  LIPASE 83* 83*   No results for input(s): AMMONIA in the last 168 hours. Coagulation Profile:  Recent Labs Lab 05/04/16 1013  INR 1.06   CBC:  Recent Labs Lab 05/01/16 0255 05/03/16 1220 05/04/16 1013  WBC 5.9 8.2 4.2  NEUTROABS 3.2 6.0  --   HGB 10.8* 11.2* 9.2*  HCT 33.3* 34.9* 29.4*  MCV 88.1 86.4 86.7  PLT 292 284 185   Cardiac Enzymes: No results for input(s): CKTOTAL, CKMB, CKMBINDEX, TROPONINI in the last 168 hours. BNP: Invalid input(s): POCBNP CBG: No results for input(s): GLUCAP in the last 168 hours. HbA1C: No results for input(s): HGBA1C in the last 72 hours. Urine analysis:    Component Value Date/Time   COLORURINE YELLOW 04/25/2016 0013   APPEARANCEUR CLEAR 04/25/2016 0013   LABSPEC 1.013 04/25/2016 0013   LABSPEC 1.025 12/25/2015 1056   PHURINE 7.0 04/25/2016 0013   GLUCOSEU NEGATIVE 04/25/2016 0013   GLUCOSEU Negative 12/25/2015 1056   HGBUR SMALL (A) 04/25/2016 0013   BILIRUBINUR NEGATIVE 04/25/2016 0013   BILIRUBINUR Negative 12/25/2015 1056   KETONESUR NEGATIVE 04/25/2016 0013   PROTEINUR NEGATIVE 04/25/2016 0013   UROBILINOGEN 0.2 12/25/2015 1056   NITRITE NEGATIVE 04/25/2016 0013   LEUKOCYTESUR SMALL (A) 04/25/2016 0013   LEUKOCYTESUR Negative 12/25/2015 1056   Sepsis Labs: '@LABRCNTIP'$ (procalcitonin:4,lacticidven:4) )No results found for this or any previous visit (from the past 240 hour(s)).   Scheduled Meds: . ciprofloxacin  400 mg Intravenous Q12H  . heparin  5,000 Units Subcutaneous Q8H   Continuous Infusions: . dextrose 5 % and 0.9% NaCl 100 mL/hr at 05/04/16 5974    Procedures/Studies: Ct Abdomen Pelvis Wo Contrast  Result Date:  04/25/2016 CLINICAL DATA:  Intermittent abdominal pain, history of colon cancer EXAM: CT ABDOMEN AND PELVIS WITHOUT CONTRAST TECHNIQUE: Multidetector CT imaging of the abdomen and pelvis was performed following the standard protocol without IV contrast. COMPARISON:  01/29/2016 FINDINGS: Lower chest: No pleural effusion is visualized. Mild subpleural fibrosis and bronchiectasis again evident. Slight increased consolidation within the lingula and anterior left lung base with associated mildly dilated bronchi. Right subpleural density slightly smaller. Now contains air bronchograms. Patchy slightly nodular appearing infiltrates are present within the right greater than left posterior lung bases. Coronary artery calcifications. Heart size within normal limits. No large pericardial effusion. Hepatobiliary: Low-attenuation at the porta hepatis could relate to periportal edema. No gross focal hepatic abnormalities. Multiple stones present within the gallbladder lumen. No wall thickening. Extrahepatic common bile duct is enlarged, measuring up to 16 mm, similar compared to prior. Pancreas: No peripancreatic inflammation. Spleen: Normal in  size without focal abnormality. Adrenals/Urinary Tract: Adrenal glands are within normal limits. Kidneys demonstrate no hydronephrosis or calcification. The bladder is normal. Stomach/Bowel: The stomach is nonenlarged. There is no dilated small bowel. Mild low sigmoid colon wall thickening presumably corresponds to history of colon cancer, this is less apparent compared to the previous exam. Appendix normal. Vascular/Lymphatic: Extensive atherosclerotic vascular disease of the aorta with focal ectasia of the infrarenal aorta. Porta hepatis adenopathy is poorly defined without contrast. Reproductive: Uterus and bilateral adnexa are unremarkable. Other: No free air or free fluid. Musculoskeletal: Degenerative changes of the spine. No suspicious or acute bone lesions are visualized.  IMPRESSION: 1. Slight increased consolidation within the lingula and anterior left lung base, could relate to inflammation or infection, but progression from prior exam makes malignancy difficult to exclude. 2. Slight increased nodular appearing infiltrates within the right greater than left posterior lung bases. 3. Multiple gallstones. Dilated extrahepatic common bowel duct is similar compared to the prior study. 4. Focal wall thickening involving the distal sigmoid colon presumably corresponds to the history of colon cancer. Porta hepatis adenopathy is poorly defined without contrast. Electronically Signed   By: Donavan Foil M.D.   On: 04/25/2016 02:41   Nm Hepatobiliary Liver Func  Result Date: 05/04/2016 CLINICAL DATA:  Abnormal LFTs. Chronic worsening right upper quadrant abdominal pain. Initial encounter. EXAM: NUCLEAR MEDICINE HEPATOBILIARY IMAGING TECHNIQUE: Sequential images of the abdomen were obtained out to 60 minutes following intravenous administration of radiopharmaceutical. The gallbladder was not visualized. As the patient has an allergy to morphine, slow intravenous infusion of 1.1 mcg of cholecystokinin was performed. RADIOPHARMACEUTICALS:  5.05 mCi Tc-76mCholetec IV COMPARISON:  None. FINDINGS: Prompt uptake and biliary excretion of activity by the liver is seen. Biliary activity passes into small bowel, consistent with patent common bile duct. The gallbladder is not visualized at 45 minutes after administration of radiopharmaceutical. As the patient has an allergy to morphine, slow intravenous infusion of cholecystokinin was performed. The gallbladder was not visualized at 1 hour following initiation of infusion. This is most compatible with acute cholecystitis, though in some cases, chronic cholecystitis can have a similar appearance. IMPRESSION: Prompt uptake and biliary excretion of activity by the liver. Gallbladder not visualized, even after infusion of cholecystokinin. This is most  compatible with acute cholecystitis, though in some cases, chronic cholecystitis can have a similar appearance. Electronically Signed   By: JGarald BaldingM.D.   On: 05/04/2016 02:38   UKoreaAbdomen Complete  Result Date: 05/03/2016 CLINICAL DATA:  Abdominal pain EXAM: ABDOMEN ULTRASOUND COMPLETE COMPARISON:  04/25/2016 FINDINGS: Gallbladder: Multiple gallstones are again identified stable from the prior CT examination. No gallbladder wall thickening or pericholecystic fluid is noted. Common bile duct: Diameter: Dilated at 11 mm. This is similar to that seen on prior CT examination. Liver: No focal lesion identified. Within normal limits in parenchymal echogenicity. IVC: No abnormality visualized. Pancreas: Visualized portion unremarkable. Spleen: Size and appearance within normal limits. Right Kidney: Length: 9.4 cm. Echogenicity within normal limits. No mass or hydronephrosis visualized. Left Kidney: Length: 9.3 cm. Echogenicity within normal limits. No mass or hydronephrosis visualized. Abdominal aorta: No aneurysm visualized. Other findings: None. IMPRESSION: Cholelithiasis. Common bile duct dilatation stable from the previous exam. Correlation with bilirubin level is recommended. Electronically Signed   By: MInez CatalinaM.D.   On: 05/03/2016 15:06   Mr 3d Recon At Scanner  Result Date: 05/03/2016 CLINICAL DATA:  83year old female with history of colon cancer. Two year history of intermittent  right upper quadrant abdominal pain. EXAM: MRI ABDOMEN WITHOUT AND WITH CONTRAST (INCLUDING MRCP) TECHNIQUE: Multiplanar multisequence MR imaging of the abdomen was performed both before and after the administration of intravenous contrast. Heavily T2-weighted images of the biliary and pancreatic ducts were obtained, and three-dimensional MRCP images were rendered by post processing. CONTRAST:  6m MULTIHANCE GADOBENATE DIMEGLUMINE 529 MG/ML IV SOLN COMPARISON:  CT scan 04/25/2016 and abdominal ultrasound 05/03/2016.  FINDINGS: Lower chest: The lung bases demonstrate chronic basilar scarring and emphysema. No effusions or pulmonary lesions. No pericardial effusion. Hepatobiliary: No focal hepatic lesions are identified. Geographic fatty infiltration is noted. There is stable intra and extrahepatic biliary dilatation. The gallbladder is filled with gallstones. The common bile duct is dilated to a maximum of 13.5 mm in the porta hepatis and 11.5 mm in the head of the pancreas. This is a stable finding. No common bile duct stones are identified. Pancreas:  No mass, inflammation or ductal dilatation. Spleen:  Normal size.  No focal lesions. Adrenals/Urinary Tract: The adrenal glands and kidneys are unremarkable. Lower pole right renal cyst is noted. Stomach/Bowel: The stomach, duodenum, visualized small bowel and visualize colon are grossly normal. Vascular/Lymphatic: Stable atherosclerotic calcifications involving the aorta and branch vessels but no aneurysm or dissection. Small scattered mesenteric and retroperitoneal lymph nodes but no mass or overt adenopathy. Other:  No ascites or abdominal wall hernia. Musculoskeletal: No significant bony findings. IMPRESSION: 1. Cholelithiasis but no findings suspicious for acute cholecystitis and no common bile duct stones. Chronic intra and extrahepatic biliary dilatation. 2. Normal MR appearance of the pancreas. Normal caliber and course of the main pancreatic duct. Electronically Signed   By: PMarijo SanesM.D.   On: 05/03/2016 21:18   Ir Perc Cholecystostomy  Result Date: 05/04/2016 INDICATION: 83year old female with acute cholecystitis EXAM: CHOLECYSTOSTOMY MEDICATIONS: Patient is receiving Cipro in the hospital; The antibiotic was administered within an appropriate time frame prior to the initiation of the procedure. ANESTHESIA/SEDATION: Moderate (conscious) sedation was employed during this procedure. A total of Versed 2 point mg and Fentanyl 50 mcg was administered intravenously.  Moderate Sedation Time: 20 minutes. The patient's level of consciousness and vital signs were monitored continuously by radiology nursing throughout the procedure under my direct supervision. FLUOROSCOPY TIME:  Fluoroscopy Time: 1 minutes 18 seconds (7.4 mGy). COMPLICATIONS: None PROCEDURE: Informed written consent was obtained from the patient and the patient's family after a thorough discussion of the procedural risks, benefits and alternatives. All questions were addressed. Maximal Sterile Barrier Technique was utilized including caps, mask, sterile gowns, sterile gloves, sterile drape, hand hygiene and skin antiseptic. A timeout was performed prior to the initiation of the procedure. Ultrasound survey of the right upper quadrant was performed for planning purposes. Once the patient is prepped and draped in the usual sterile fashion, the skin and subcutaneous tissues overlying the gallbladder were generously infiltrated 1% lidocaine for local anesthesia. A coaxial needle was advanced under ultrasound guidance through the skin subcutaneous tissues and a small segment of liver into the gallbladder lumen. With removal of the stylet, spontaneous dark bile drainage occurred. Using modified Seldinger technique, a 10 French drain was placed into the gallbladder fossa, with aspiration of the sample for the lab. Contrast injection confirmed position of the tube within the gallbladder lumen. Drainage catheter was attached to gravity drain with a suture retention placed. Patient tolerated the procedure well and remained hemodynamically stable throughout. No complications were encountered and no significant blood loss encountered. IMPRESSION: Status post percutaneous cholecystostomy.  Signed, Dulcy Fanny. Earleen Newport, DO [facility] Electronically Signed   By: Corrie Mckusick D.O.   On: 05/04/2016 16:32   Mr Abdomen Mrcp Moise Boring Contast  Result Date: 05/03/2016 CLINICAL DATA:   83 year old female with history of colon cancer. Two year history of intermittent right upper quadrant abdominal pain. EXAM: MRI ABDOMEN WITHOUT AND WITH CONTRAST (INCLUDING MRCP) TECHNIQUE: Multiplanar multisequence MR imaging of the abdomen was performed both before and after the administration of intravenous contrast. Heavily T2-weighted images of the biliary and pancreatic ducts were obtained, and three-dimensional MRCP images were rendered by post processing. CONTRAST:  40m MULTIHANCE GADOBENATE DIMEGLUMINE 529 MG/ML IV SOLN COMPARISON:  CT scan 04/25/2016 and abdominal ultrasound 05/03/2016. FINDINGS: Lower chest: The lung bases demonstrate chronic basilar scarring and emphysema. No effusions or pulmonary lesions. No pericardial effusion. Hepatobiliary: No focal hepatic lesions are identified. Geographic fatty infiltration is noted. There is stable intra and extrahepatic biliary dilatation. The gallbladder is filled with gallstones. The common bile duct is dilated to a maximum of 13.5 mm in the porta hepatis and 11.5 mm in the head of the pancreas. This is a stable finding. No common bile duct stones are identified. Pancreas:  No mass, inflammation or ductal dilatation. Spleen:  Normal size.  No focal lesions. Adrenals/Urinary Tract: The adrenal glands and kidneys are unremarkable. Lower pole right renal cyst is noted. Stomach/Bowel: The stomach, duodenum, visualized small bowel and visualize colon are grossly normal. Vascular/Lymphatic: Stable atherosclerotic calcifications involving the aorta and branch vessels but no aneurysm or dissection. Small scattered mesenteric and retroperitoneal lymph nodes but no mass or overt adenopathy. Other:  No ascites or abdominal wall hernia. Musculoskeletal: No significant bony findings. IMPRESSION: 1. Cholelithiasis but no findings suspicious for acute cholecystitis and no common bile duct stones. Chronic intra and extrahepatic biliary dilatation. 2. Normal MR appearance  of the pancreas. Normal caliber and course of the main pancreatic duct. Electronically Signed   By: PMarijo SanesM.D.   On: 05/03/2016 21:18    Clever Geraldo, DO  [facility] Pager [phone] If 7PM-7AM, please contact night-coverage [email or web address] Password [email or web address] 05/04/2016, 5:18 PM   LOS: 1 day

## 2016-05-04 NOTE — Evaluation (Signed)
Occupational Therapy Evaluation Patient Details Name: Joann Herrera MRN: [id number] DOB: 1923/04/28 Today's Date: 05/04/2016    History of Present Illness Pt is a 83 y/o female w/ h/o colon ca w/ suspected mets. She was admitted to [facility] hospital w/ c/o R upper quadrant pain w/ nausea and vomiting. See EPIC for PMH. She lives alone and ambulates with a RW.    Clinical Impression   Pt admitted as stated above. She currently presents with deficits related to ADL's, self care and functional mobility (see OT problem list below). She should benefit from acute OT to assist with maximizing indepedence with ADL's/self care tasks. OT recommendeds SNF Rehab as pt lives alone, if she is agreeable to this.    Follow Up Recommendations  SNF;Supervision/Assistance - 24 hour (If pt is agreeable to this)    Equipment Recommendations  Other (comment) (Has RW and 3:1; Rec grab bars in bathroom/tub at home.)    Recommendations for Other Services       Precautions / Restrictions Precautions Precautions: Fall Restrictions Weight Bearing Restrictions: No      Mobility Bed Mobility Overal bed mobility: Needs Assistance Bed Mobility: Supine to Sit     Supine to sit: Supervision;Min guard     General bed mobility comments: Pt somewhat impulsive, ? thias may be related to pain medications per son and pt.  Transfers Overall transfer level: Needs assistance Equipment used: Rolling walker (2 wheeled) Transfers: Sit to/from [facility] Sit to Stand: Min assist Stand pivot transfers: Min assist       General transfer comment: Pt w/ LOB during transfers and when moving quickly in bathroom. She was able to self correct but currently requires Min A for safety. She was also noted to let go of her RW when returning to recliner - VC's and Min A needed.    Balance Overall balance assessment: Needs assistance Sitting-balance support: Feet supported;Bilateral upper extremity  supported Sitting balance-Leahy Scale: Good     Standing balance support: No upper extremity supported;During functional activity Standing balance-Leahy Scale: Fair Standing balance comment: Pt requires Min A for LOB at times during transfers and functional activity - this may be related to pain medications per pt and son.                            ADL Overall ADL's : Needs assistance/impaired Eating/Feeding:  (Pt reports that she is currently NPO)   Grooming: Wash/dry hands;Minimal assistance;Standing   Upper Body Bathing: Set up;Sitting (Simulated)   Lower Body Bathing: Set up;Minimal assistance;Sit to/from stand;Cueing for safety (Simulated)   Upper Body Dressing : Set up;Sitting   Lower Body Dressing: Minimal assistance;Sit to/from stand Lower Body Dressing Details (indicate cue type and reason): Pt able to don/doff socks seated but is noted to have LOB at times, self corrects but requires Min A for safety. Suspect that this is related to pain medication Toilet Transfer: Minimal assistance;Ambulation;RW;Cueing for safety Toilet Transfer Details (indicate cue type and reason): Pt with LOB at times, self corrects but requires Min A for safety. Suspect that this is related to pain medication per pt and family reports Toileting- Water quality scientist and Hygiene: Minimal assistance;Sit to/from stand;Sitting/lateral lean;Set up Toileting - Clothing Manipulation Details (indicate cue type and reason): Pt with BM and required Min A for hygiene     Functional mobility during ADLs: Minimal assistance;Cueing for safety General ADL Comments: Pt/son were educated in role of OT and  discussed plan of care after she participated in functonal mobility, transfers, toileting and grooming today. She should benefit from acute OT to assist in maximizing indepedence with ADL's/self care tasks. Discussed HHOT vs SNF Rehab as pt lives alone. Pt reports that that she plans to return home if  able.     Vision  H/O Macular degeneration; Has special glasses and a screen that she only uses for watching TV at home. No changes from baseline.   Perception     Praxis      Pertinent Vitals/Pain Pain Assessment: No/denies pain     Hand Dominance Right   Extremity/Trunk Assessment Upper Extremity Assessment Upper Extremity Assessment: Overall WFL for tasks assessed   Lower Extremity Assessment Lower Extremity Assessment: Defer to PT evaluation       Communication Communication Communication: No difficulties   Cognition Arousal/Alertness: Awake/alert Behavior During Therapy: WFL for tasks assessed/performed Overall Cognitive Status: Within Functional Limits for tasks assessed                     General Comments       Exercises       Shoulder Instructions      Home Living Family/patient expects to be discharged to:: Private residence Living Arrangements: Alone Available Help at Discharge: Family;Available PRN/intermittently Type of Home: House Home Access: Stairs to enter [employer] of Steps: 3 Entrance Stairs-Rails: Left Home Layout: One level     Bathroom Shower/Tub: Teacher, early years/pre: Standard Bathroom Accessibility: Yes   Home Equipment: Environmental consultant - 2 wheels;Bedside commode   Additional Comments: Family assists grocery shopping, yard work and cleaning lady every other week. Pt doesn't drive      Prior Functioning/Environment Level of Independence: Needs assistance  Gait / Transfers Assistance Needed: Pt uses RW at home "to get around" ADL's / Homemaking Assistance Needed: Pt doesn't drive; Son assist with yard work and [employer].   Comments: Pt uses a screen and glasses to help see TV at home due to Mac degeneration/visual deficits.        OT Problem List: Impaired balance (sitting and/or standing);Decreased knowledge of precautions;Pain;Impaired vision/perception;Decreased activity  tolerance;Decreased knowledge of use of DME or AE (H/o visual deficits due to Mac degeneration. Will not address directly but make sure that therapy and staff are aware )   OT Treatment/Interventions: Self-care/ADL training;Patient/family education    OT Goals(Current goals can be found in the care plan section) Acute Rehab OT Goals Patient Stated Goal: Decreased pain; home as able per pt/son Time For Goal Achievement: 05/18/16 Potential to Achieve Goals: Good  OT Frequency: Min 2X/week   Barriers to D/C: Other (comment)  Pt lives alone, does not drive, significant PMH, had declined hospice in the past. Admit note from hospitalist states that pt may benefit from pallitative care consult - no orders yet in chart.       Co-evaluation PT/OT/SLP Co-Evaluation/Treatment: Yes Reason for Co-Treatment: To address functional/ADL transfers;Complexity of the patient's impairments (multi-system involvement)   OT goals addressed during session: ADL's and self-care      End of Session Equipment Utilized During Treatment: Gait belt;Rolling walker  Activity Tolerance: Patient tolerated treatment well;No increased pain Patient left: in chair;with call bell/phone within reach;with chair alarm set   Time: 7048-8891 OT Time Calculation (min): 35 min Charges:  OT General Charges $OT Visit: 1 Procedure OT Evaluation $OT Eval Moderate Complexity: 1 Procedure OT Treatments $Self Care/Home Management : 8-22 mins G-Codes:  Carlynn Herald, Zachary Lovins Beth Dixon, OTR/L 05/04/2016, 11:22 AM

## 2016-05-05 DIAGNOSIS — E860 Dehydration: Secondary | ICD-10-CM

## 2016-05-05 DIAGNOSIS — C189 Malignant neoplasm of colon, unspecified: Secondary | ICD-10-CM

## 2016-05-05 LAB — CBC
HEMATOCRIT: 30.6 % — AB (ref 36.0–46.0)
HEMOGLOBIN: 9.8 g/dL — AB (ref 12.0–15.0)
MCH: 28.4 pg (ref 26.0–34.0)
MCHC: 32 g/dL (ref 30.0–36.0)
MCV: 88.7 fL (ref 78.0–100.0)
Platelets: 121 10*3/uL — ABNORMAL LOW (ref 150–400)
RBC: 3.45 MIL/uL — AB (ref 3.87–5.11)
RDW: 14 % (ref 11.5–15.5)
WBC: 5.1 10*3/uL (ref 4.0–10.5)

## 2016-05-05 LAB — COMPREHENSIVE METABOLIC PANEL
ALBUMIN: 2.8 g/dL — AB (ref 3.5–5.0)
ALK PHOS: 377 U/L — AB (ref 38–126)
ALT: 172 U/L — ABNORMAL HIGH (ref 14–54)
AST: 99 U/L — ABNORMAL HIGH (ref 15–41)
Anion gap: 5 (ref 5–15)
BUN: 12 mg/dL (ref 6–20)
CALCIUM: 8.1 mg/dL — AB (ref 8.9–10.3)
CO2: 25 mmol/L (ref 22–32)
Chloride: 108 mmol/L (ref 101–111)
Creatinine, Ser: 0.87 mg/dL (ref 0.44–1.00)
GFR calc non Af Amer: 57 mL/min — ABNORMAL LOW (ref >60)
GLUCOSE: 112 mg/dL — AB (ref 65–99)
POTASSIUM: 3.8 mmol/L (ref 3.5–5.1)
SODIUM: 138 mmol/L (ref 135–145)
Total Bilirubin: 0.8 mg/dL (ref 0.3–1.2)
Total Protein: 5.9 g/dL — ABNORMAL LOW (ref 6.5–8.1)

## 2016-05-05 NOTE — Progress Notes (Signed)
No charge note.  Palliative consult request received, chart reviewed, surgery onc TRH and GI notes reviewed.  Patient seen briefly this morning, resting in bed. No family at bedside Call placed, waiting to hear from son to have further discussions.  Full note and final recommendations to follow Thank you for the consult.   Loistine Chance MD [facility] palliative medicine team [phone]

## 2016-05-05 NOTE — Progress Notes (Signed)
Patient ID: Joann Herrera, female   DOB: July 12, 1923, 83 y.o.   MRN: [id number]    Referring Physician(s): Gross, S.  Supervising Physician: Aletta Edouard  Patient Status:  [facility] - In-pt  Chief Complaint: Acute Cholecystitis  Subjective: S/P cholecystostomy  drain on 05/04/16 for acute cholecystitis. Today she is nauseous without vomiting. Denies any pain, fevers, or chills. Drained 105 cc of bile so far.    Allergies: Aspirin; Codeine; Morphine and related; and Penicillins  Medications: Prior to Admission medications   Medication Sig Start Date End Date Taking? Authorizing Provider  acetaminophen (TYLENOL) 500 MG tablet Take 1,000 mg by mouth every 6 (six) hours as needed for mild pain or moderate pain.    Yes Historical Provider, MD  HYDROmorphone (DILAUDID) 2 MG tablet Take 0.5-1 tablets (1-2 mg total) by mouth every 4 (four) hours as needed for severe pain. 04/29/16  Yes Truitt Merle, MD  lidocaine-prilocaine (EMLA) cream Apply 1 application topically as needed. Patient taking differently: Apply 1 application topically daily as needed (port access).  04/08/16  Yes Owens Shark, NP  ondansetron (ZOFRAN) 8 MG tablet Take 1 tablet (8 mg total) by mouth every 8 (eight) hours as needed for nausea or vomiting. 04/29/16  Yes Truitt Merle, MD  Polyethyl Glycol-Propyl Glycol (SYSTANE OP) Apply 1-2 drops to eye 4 (four) times daily as needed (for dry eye).   Yes Historical Provider, MD  traMADol (ULTRAM) 50 MG tablet Take 1 tablet (50 mg total) by mouth every 8 (eight) hours as needed. Patient taking differently: Take 50 mg by mouth every 8 (eight) hours as needed for moderate pain or severe pain.  04/08/16  Yes Owens Shark, NP  ondansetron (ZOFRAN ODT) 4 MG disintegrating tablet Take 1 tablet (4 mg total) by mouth every 8 (eight) hours as needed for nausea or vomiting. Patient not taking: Reported on 05/03/2016 04/25/16   Carlisle Cater, PA-C     Vital Signs: BP (!) 114/47 (BP Location: Left Arm)    Pulse 60   Temp 97.8 F (36.6 C) (Oral)   Resp 17   Ht 5\' 4"  (1.626 m)   Wt 122 lb (55.3 kg)   SpO2 98%   BMI 20.94 kg/m   Physical Exam  Awake and alert with some discomfort. Heart RRR. Lungs clear to auscultation bilaterally.  No edema on legs. Drain site is clean, dry, and intact without erythema or drainage.  Imaging: Nm Hepatobiliary Liver Func  Result Date: 05/04/2016 CLINICAL DATA:  Abnormal LFTs. Chronic worsening right upper quadrant abdominal pain. Initial encounter. EXAM: NUCLEAR MEDICINE HEPATOBILIARY IMAGING TECHNIQUE: Sequential images of the abdomen were obtained out to 60 minutes following intravenous administration of radiopharmaceutical. The gallbladder was not visualized. As the patient has an allergy to morphine, slow intravenous infusion of 1.1 mcg of cholecystokinin was performed. RADIOPHARMACEUTICALS:  5.05 mCi Tc-52m Choletec IV COMPARISON:  None. FINDINGS: Prompt uptake and biliary excretion of activity by the liver is seen. Biliary activity passes into small bowel, consistent with patent common bile duct. The gallbladder is not visualized at 45 minutes after administration of radiopharmaceutical. As the patient has an allergy to morphine, slow intravenous infusion of cholecystokinin was performed. The gallbladder was not visualized at 1 hour following initiation of infusion. This is most compatible with acute cholecystitis, though in some cases, chronic cholecystitis can have a similar appearance. IMPRESSION: Prompt uptake and biliary excretion of activity by the liver. Gallbladder not visualized, even after infusion of cholecystokinin. This is most compatible  with acute cholecystitis, though in some cases, chronic cholecystitis can have a similar appearance. Electronically Signed   By: Garald Balding M.D.   On: 05/04/2016 02:38   US Abdomen Complete  Result Date: 05/03/2016 CLINICAL DATA:  Abdominal pain EXAM: ABDOMEN ULTRASOUND COMPLETE COMPARISON:  04/25/2016 FINDINGS:  Gallbladder: Multiple gallstones are again identified stable from the prior CT examination. No gallbladder wall thickening or pericholecystic fluid is noted. Common bile duct: Diameter: Dilated at 11 mm. This is similar to that seen on prior CT examination. Liver: No focal lesion identified. Within normal limits in parenchymal echogenicity. IVC: No abnormality visualized. Pancreas: Visualized portion unremarkable. Spleen: Size and appearance within normal limits. Right Kidney: Length: 9.4 cm. Echogenicity within normal limits. No mass or hydronephrosis visualized. Left Kidney: Length: 9.3 cm. Echogenicity within normal limits. No mass or hydronephrosis visualized. Abdominal aorta: No aneurysm visualized. Other findings: None. IMPRESSION: Cholelithiasis. Common bile duct dilatation stable from the previous exam. Correlation with bilirubin level is recommended. Electronically Signed   By: Inez Catalina M.D.   On: 05/03/2016 15:06   Mr 3d Recon At Scanner  Result Date: 05/03/2016 CLINICAL DATA:  83 year old female with history of colon cancer. Two year history of intermittent right upper quadrant abdominal pain. EXAM: MRI ABDOMEN WITHOUT AND WITH CONTRAST (INCLUDING MRCP) TECHNIQUE: Multiplanar multisequence MR imaging of the abdomen was performed both before and after the administration of intravenous contrast. Heavily T2-weighted images of the biliary and pancreatic ducts were obtained, and three-dimensional MRCP images were rendered by post processing. CONTRAST:  32mL MULTIHANCE GADOBENATE DIMEGLUMINE 529 MG/ML IV SOLN COMPARISON:  CT scan 04/25/2016 and abdominal ultrasound 05/03/2016. FINDINGS: Lower chest: The lung bases demonstrate chronic basilar scarring and emphysema. No effusions or pulmonary lesions. No pericardial effusion. Hepatobiliary: No focal hepatic lesions are identified. Geographic fatty infiltration is noted. There is stable intra and extrahepatic biliary dilatation. The gallbladder is filled  with gallstones. The common bile duct is dilated to a maximum of 13.5 mm in the porta hepatis and 11.5 mm in the head of the pancreas. This is a stable finding. No common bile duct stones are identified. Pancreas:  No mass, inflammation or ductal dilatation. Spleen:  Normal size.  No focal lesions. Adrenals/Urinary Tract: The adrenal glands and kidneys are unremarkable. Lower pole right renal cyst is noted. Stomach/Bowel: The stomach, duodenum, visualized small bowel and visualize colon are grossly normal. Vascular/Lymphatic: Stable atherosclerotic calcifications involving the aorta and branch vessels but no aneurysm or dissection. Small scattered mesenteric and retroperitoneal lymph nodes but no mass or overt adenopathy. Other:  No ascites or abdominal wall hernia. Musculoskeletal: No significant bony findings. IMPRESSION: 1. Cholelithiasis but no findings suspicious for acute cholecystitis and no common bile duct stones. Chronic intra and extrahepatic biliary dilatation. 2. Normal MR appearance of the pancreas. Normal caliber and course of the main pancreatic duct. Electronically Signed   By: Marijo Sanes M.D.   On: 05/03/2016 21:18   Ir Perc Cholecystostomy  Result Date: 05/04/2016 INDICATION: 83 year old female with acute cholecystitis EXAM: CHOLECYSTOSTOMY MEDICATIONS: Patient is receiving Cipro in the hospital; The antibiotic was administered within an appropriate time frame prior to the initiation of the procedure. ANESTHESIA/SEDATION: Moderate (conscious) sedation was employed during this procedure. A total of Versed 2 point mg and Fentanyl 50 mcg was administered intravenously. Moderate Sedation Time: 20 minutes. The patient's level of consciousness and vital signs were monitored continuously by radiology nursing throughout the procedure under my direct supervision. FLUOROSCOPY TIME:  Fluoroscopy Time: 1 minutes  18 seconds (7.4 mGy). COMPLICATIONS: None PROCEDURE: Informed written consent was obtained  from the patient and the patient's family after a thorough discussion of the procedural risks, benefits and alternatives. All questions were addressed. Maximal Sterile Barrier Technique was utilized including caps, mask, sterile gowns, sterile gloves, sterile drape, hand hygiene and skin antiseptic. A timeout was performed prior to the initiation of the procedure. Ultrasound survey of the right upper quadrant was performed for planning purposes. Once the patient is prepped and draped in the usual sterile fashion, the skin and subcutaneous tissues overlying the gallbladder were generously infiltrated 1% lidocaine for local anesthesia. A coaxial needle was advanced under ultrasound guidance through the skin subcutaneous tissues and a small segment of liver into the gallbladder lumen. With removal of the stylet, spontaneous dark bile drainage occurred. Using modified Seldinger technique, a 10 French drain was placed into the gallbladder fossa, with aspiration of the sample for the lab. Contrast injection confirmed position of the tube within the gallbladder lumen. Drainage catheter was attached to gravity drain with a suture retention placed. Patient tolerated the procedure well and remained hemodynamically stable throughout. No complications were encountered and no significant blood loss encountered. IMPRESSION: Status post percutaneous cholecystostomy. Signed, Dulcy Fanny. Earleen Newport, DO [facility] Electronically Signed   By: Corrie Mckusick D.O.   On: 05/04/2016 16:32   Mr Abdomen Mrcp Moise Boring Contast  Result Date: 05/03/2016 CLINICAL DATA:  83 year old female with history of colon cancer. Two year history of intermittent right upper quadrant abdominal pain. EXAM: MRI ABDOMEN WITHOUT AND WITH CONTRAST (INCLUDING MRCP) TECHNIQUE: Multiplanar multisequence MR imaging of the abdomen was performed both before and after the administration of intravenous contrast. Heavily  T2-weighted images of the biliary and pancreatic ducts were obtained, and three-dimensional MRCP images were rendered by post processing. CONTRAST:  63mL MULTIHANCE GADOBENATE DIMEGLUMINE 529 MG/ML IV SOLN COMPARISON:  CT scan 04/25/2016 and abdominal ultrasound 05/03/2016. FINDINGS: Lower chest: The lung bases demonstrate chronic basilar scarring and emphysema. No effusions or pulmonary lesions. No pericardial effusion. Hepatobiliary: No focal hepatic lesions are identified. Geographic fatty infiltration is noted. There is stable intra and extrahepatic biliary dilatation. The gallbladder is filled with gallstones. The common bile duct is dilated to a maximum of 13.5 mm in the porta hepatis and 11.5 mm in the head of the pancreas. This is a stable finding. No common bile duct stones are identified. Pancreas:  No mass, inflammation or ductal dilatation. Spleen:  Normal size.  No focal lesions. Adrenals/Urinary Tract: The adrenal glands and kidneys are unremarkable. Lower pole right renal cyst is noted. Stomach/Bowel: The stomach, duodenum, visualized small bowel and visualize colon are grossly normal. Vascular/Lymphatic: Stable atherosclerotic calcifications involving the aorta and branch vessels but no aneurysm or dissection. Small scattered mesenteric and retroperitoneal lymph nodes but no mass or overt adenopathy. Other:  No ascites or abdominal wall hernia. Musculoskeletal: No significant bony findings. IMPRESSION: 1. Cholelithiasis but no findings suspicious for acute cholecystitis and no common bile duct stones. Chronic intra and extrahepatic biliary dilatation. 2. Normal MR appearance of the pancreas. Normal caliber and course of the main pancreatic duct. Electronically Signed   By: Marijo Sanes M.D.   On: 05/03/2016 21:18    Labs:  CBC:  Recent Labs  05/01/16 0255 05/03/16 1220 05/04/16 1013 05/05/16 0502  WBC 5.9 8.2 4.2 5.1  HGB 10.8* 11.2* 9.2* 9.8*  HCT 33.3* 34.9* 29.4* 30.6*  PLT 292  284 185 121*  COAGS:  Recent Labs  02/12/16 1135 05/04/16 1013  INR 0.94 1.06    BMP:  Recent Labs  05/01/16 0255 05/03/16 1220 05/04/16 1013 05/05/16 0502  NA 136 136 138 138  K 4.3 4.3 3.8 3.8  CL 104 102 107 108  CO2 23 27 26 25   GLUCOSE 109* 109* 102* 112*  BUN 18 21* 16 12  CALCIUM 8.3* 8.6* 7.9* 8.1*  CREATININE 0.68 0.95 0.83 0.87  GFRNONAA >60 51* >60 57*  GFRAA >60 59* >60 >60    LIVER FUNCTION TESTS:  Recent Labs  05/01/16 0255 05/03/16 1220 05/04/16 1013 05/05/16 0502  BILITOT 0.3 0.8 0.6 0.8  AST 26 431* 237* 99*  ALT 43 143* 254* 172*  ALKPHOS 314* 550* 462* 377*  PROT 6.3* 6.5 5.8* 5.9*  ALBUMIN 3.1* 3.3* 2.9* 2.8*    Assessment and Plan: S/P cholecystostomy  drain on 05/04/16. Patient is currently nauseous. However, drain is functioning appropriately with normal WBC and decreasing LFT's.  Drain has no signs of infection or bleeding. Check final bile cx's; current drain will need to remain in place for at least 4-6 weeks to allow for tract maturity before removing unless cholecystectomy done in the interim. Electronically Signed: D. Rowe Robert 05/05/2016, 11:39 AM   I spent a total of 15 Minutes at the the patient's bedside AND on the patient's hospital floor or unit, greater than 50% of which was counseling/coordinating care for cholecystostomy with drain.

## 2016-05-05 NOTE — Progress Notes (Addendum)
PROGRESS NOTE    Joann Herrera  [id number] DOB: [birth year] DOA: 05/03/2016 PCP: [name], MD     Brief Narrative:  83 y.o. WF PMHx Colon CA with suspected Metastases, squamous cell Cancer  Presenting with abdominal pain. She reports about 2 years of waxing/waning RUQ abdominal pain that is severe, aching, radiating to the back, sometimes worse with food. Has had nausea and vomiting in the past, but not today. Pain better with tylenol and po dilaudid. Has been eating less today and diffusely weak. She had been seen 4 times in the past 2 weeks for similar complaints.    Subjective: 1/11 A/O 4, appropriate pain to right lateral thoracic lateral Cavity consistent with placement of drain. Patient states does not want inpatient hospice. Wants to die at home.    Assessment & Plan:   Principal Problem:   Abdominal pain Active Problems:   GERD (gastroesophageal reflux disease)   Cholelithiasis   Dilated cbd, acquired   Cancer of left colon (HCC)   Dehydration   Constipation   Cholecystitis, acute   Abnormal LFTs   Calculus of gallbladder and bile duct with cholecystitis with obstruction   Cholecystitis, chronic   Chronic calculus cholecystitis -This maybe in part the reason for her chronic abdominal pain -05/04/16--HIDA--no GB filling suggesting acute cholecystitis -05/03/16--MRCP--cholelithiasis with choledocholithiasis, chronic intra-and extra biliary dilatation -05/04/16--perc cholecystotomy tube placed as pt is high risk surgical candidate -continue cipro -Trend LFTs  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 on reduced-dose Beryle Flock. CT 01/29/16 showed stable disease. Recent plan was to schedule PET scan.  - Will add [name] to treatment team. - Consider bowel regimen for chronic constipation once taking po - Previously declined hospice and surgery.  -Distended and S4 consult request for  physicals. Discussion as there is no -consult palliative care for ongoing goals of care discussion. -Suspect her subacute worsening weakness is related to malignancy.  Dehydration -continue IVF  FTT -PT eval -nutrition consult  Constipation -Patient had a bowel movement this morning -needs regular bowel regimen once tolerating po   DVT prophylaxis: Subcutaneous heparin Code Status: Full Family Communication: None Disposition Plan: Patient requested a home with hospice. Awaiting PALLIATIVE CARE recommendations.   Consultants:  [facility]    Procedures/Significant Events:  1/10 S/P cholecystostomy  drain will need to remain in place for at least 4-6 weeks to allow for tract maturity before removing    VENTILATOR SETTINGS:    Cultures  Antimicrobials:    Devices   LINES / TUBES:      Continuous Infusions: . dextrose 5 % and 0.9% NaCl 100 mL/hr at 05/04/16 1951     Objective: Vitals:   05/04/16 2121 05/05/16 0433 05/05/16 0823 05/05/16 1417  BP: 112/61 (!) 94/48 (!) 114/47 (!) 103/48  Pulse: (!) 58 60  60  Resp: 16 17  16   Temp: 97.6 F (36.4 C) 97.8 F (36.6 C)  97.7 F (36.5 C)  TempSrc: Oral Oral  Oral  SpO2: 95% 98%  97%  Weight:      Height:        Intake/Output Summary (Last 24 hours) at 05/05/16 1545 Last data filed at 05/05/16 1522  Gross per 24 hour  Intake          3746.67 ml  Output              105 ml  Net  3641.67 ml   Filed Weights   05/03/16 1022  Weight: 55.3 kg (122 lb)    Examination:  General: A/O 4, cachectic, appropriately tender for recent drain placement, No acute respiratory distress Eyes: negative scleral hemorrhage, negative anisocoria, negative icterus ENT: Negative Runny nose, negative gingival bleeding, Neck:  Negative scars, masses, torticollis, lymphadenopathy, JVD Lungs: Clear to auscultation bilaterally without wheezes or crackles Cardiovascular: Regular rate and  rhythm without murmur gallop or rub normal S1 and S2 Abdomen: negative abdominal pain, nondistended, positive soft, bowel sounds, no rebound, no ascites, no appreciable mass, cholecystectomy drain in place right chest wall Extremities:: No significant cyanosis, clubbing, or edema bilateral lower extremities Skin: Negative rashes, lesions, ulcers Psychiatric:  Negative depression, negative anxiety, negative fatigue, negative mania  Central nervous system:  Cranial nerves II through XII intact, tongue/uvula midline, all extremities muscle strength 5/5, sensation intact throughout,negative dysarthria, negative expressive aphasia, negative receptive aphasia.  .     Data Reviewed: Care during the described time interval was provided by me .  I have reviewed this patient's available data, including medical history, events of note, physical examination, and all test results as part of my evaluation. I have personally reviewed and interpreted all radiology studies.  CBC:  Recent Labs Lab 05/01/16 0255 05/03/16 1220 05/04/16 1013 05/05/16 0502  WBC 5.9 8.2 4.2 5.1  NEUTROABS 3.2 6.0  --   --   HGB 10.8* 11.2* 9.2* 9.8*  HCT 33.3* 34.9* 29.4* 30.6*  MCV 88.1 86.4 86.7 88.7  PLT 292 284 185 [id number]*   Basic Metabolic Panel:  Recent Labs Lab 05/01/16 0255 05/03/16 1220 05/04/16 1013 05/05/16 0502  NA 136 136 138 138  K 4.3 4.3 3.8 3.8  CL 104 102 107 108  CO2 23 27 26 25   GLUCOSE 109* 109* 102* 112*  BUN 18 21* 16 12  CREATININE 0.68 0.95 0.83 0.87  CALCIUM 8.3* 8.6* 7.9* 8.1*   GFR: Estimated Creatinine Clearance: 37.1 mL/min (by C-G formula based on SCr of 0.87 mg/dL). Liver Function Tests:  Recent Labs Lab 05/01/16 0255 05/03/16 1220 05/04/16 1013 05/05/16 0502  AST 26 431* 237* 99*  ALT 43 143* 254* 172*  ALKPHOS 314* 550* 462* 377*  BILITOT 0.3 0.8 0.6 0.8  PROT 6.3* 6.5 5.8* 5.9*  ALBUMIN 3.1* 3.3* 2.9* 2.8*    Recent Labs Lab 05/01/16 0255 05/03/16 1220    LIPASE 83* 83*   No results for input(s): AMMONIA in the last 168 hours. Coagulation Profile:  Recent Labs Lab 05/04/16 1013  INR 1.06   Cardiac Enzymes: No results for input(s): CKTOTAL, CKMB, CKMBINDEX, TROPONINI in the last 168 hours. BNP (last 3 results) No results for input(s): PROBNP in the last 8760 hours. HbA1C: No results for input(s): HGBA1C in the last 72 hours. CBG: No results for input(s): GLUCAP in the last 168 hours. Lipid Profile: No results for input(s): CHOL, HDL, LDLCALC, TRIG, CHOLHDL, LDLDIRECT in the last 72 hours. Thyroid Function Tests: No results for input(s): TSH, T4TOTAL, FREET4, T3FREE, THYROIDAB in the last 72 hours. Anemia Panel: No results for input(s): VITAMINB12, FOLATE, FERRITIN, TIBC, IRON, RETICCTPCT in the last 72 hours. Sepsis Labs: No results for input(s): PROCALCITON, LATICACIDVEN in the last 168 hours.  Recent Results (from the past 240 hour(s))  Aerobic/Anaerobic Culture (surgical/deep wound)     Status: None (Preliminary result)   Collection Time: 05/04/16  4:21 PM  Result Value Ref Range Status   Specimen Description GALL BLADDER BILE  Final  Special Requests NONE  Final   Gram Stain   Final    ABUNDANT WBC PRESENT, PREDOMINANTLY PMN RARE GRAM POSITIVE COCCI IN PAIRS    Culture   Final    NO GROWTH < 24 HOURS Performed at [facility]    Report Status PENDING  Incomplete         Radiology Studies: Nm Hepatobiliary Liver Func  Result Date: 05/04/2016 CLINICAL DATA:  Abnormal LFTs. Chronic worsening right upper quadrant abdominal pain. Initial encounter. EXAM: NUCLEAR MEDICINE HEPATOBILIARY IMAGING TECHNIQUE: Sequential images of the abdomen were obtained out to 60 minutes following intravenous administration of radiopharmaceutical. The gallbladder was not visualized. As the patient has an allergy to morphine, slow intravenous infusion of 1.1 mcg of cholecystokinin was performed. RADIOPHARMACEUTICALS:  5.05 mCi  Tc-60m Choletec IV COMPARISON:  None. FINDINGS: Prompt uptake and biliary excretion of activity by the liver is seen. Biliary activity passes into small bowel, consistent with patent common bile duct. The gallbladder is not visualized at 45 minutes after administration of radiopharmaceutical. As the patient has an allergy to morphine, slow intravenous infusion of cholecystokinin was performed. The gallbladder was not visualized at 1 hour following initiation of infusion. This is most compatible with acute cholecystitis, though in some cases, chronic cholecystitis can have a similar appearance. IMPRESSION: Prompt uptake and biliary excretion of activity by the liver. Gallbladder not visualized, even after infusion of cholecystokinin. This is most compatible with acute cholecystitis, though in some cases, chronic cholecystitis can have a similar appearance. Electronically Signed   By: Garald Balding M.D.   On: 05/04/2016 02:38   Mr 3d Recon At Scanner  Result Date: 05/03/2016 CLINICAL DATA:  83 year old female with history of colon cancer. Two year history of intermittent right upper quadrant abdominal pain. EXAM: MRI ABDOMEN WITHOUT AND WITH CONTRAST (INCLUDING MRCP) TECHNIQUE: Multiplanar multisequence MR imaging of the abdomen was performed both before and after the administration of intravenous contrast. Heavily T2-weighted images of the biliary and pancreatic ducts were obtained, and three-dimensional MRCP images were rendered by post processing. CONTRAST:  59mL MULTIHANCE GADOBENATE DIMEGLUMINE 529 MG/ML IV SOLN COMPARISON:  CT scan 04/25/2016 and abdominal ultrasound 05/03/2016. FINDINGS: Lower chest: The lung bases demonstrate chronic basilar scarring and emphysema. No effusions or pulmonary lesions. No pericardial effusion. Hepatobiliary: No focal hepatic lesions are identified. Geographic fatty infiltration is noted. There is stable intra and extrahepatic biliary dilatation. The gallbladder is filled with  gallstones. The common bile duct is dilated to a maximum of 13.5 mm in the porta hepatis and 11.5 mm in the head of the pancreas. This is a stable finding. No common bile duct stones are identified. Pancreas:  No mass, inflammation or ductal dilatation. Spleen:  Normal size.  No focal lesions. Adrenals/Urinary Tract: The adrenal glands and kidneys are unremarkable. Lower pole right renal cyst is noted. Stomach/Bowel: The stomach, duodenum, visualized small bowel and visualize colon are grossly normal. Vascular/Lymphatic: Stable atherosclerotic calcifications involving the aorta and branch vessels but no aneurysm or dissection. Small scattered mesenteric and retroperitoneal lymph nodes but no mass or overt adenopathy. Other:  No ascites or abdominal wall hernia. Musculoskeletal: No significant bony findings. IMPRESSION: 1. Cholelithiasis but no findings suspicious for acute cholecystitis and no common bile duct stones. Chronic intra and extrahepatic biliary dilatation. 2. Normal MR appearance of the pancreas. Normal caliber and course of the main pancreatic duct. Electronically Signed   By: Marijo Sanes M.D.   On: 05/03/2016 21:18   Ir Perc Cholecystostomy  Result Date: 05/04/2016 INDICATION: 83 year old female with acute cholecystitis EXAM: CHOLECYSTOSTOMY MEDICATIONS: Patient is receiving Cipro in the hospital; The antibiotic was administered within an appropriate time frame prior to the initiation of the procedure. ANESTHESIA/SEDATION: Moderate (conscious) sedation was employed during this procedure. A total of Versed 2 point mg and Fentanyl 50 mcg was administered intravenously. Moderate Sedation Time: 20 minutes. The patient's level of consciousness and vital signs were monitored continuously by radiology nursing throughout the procedure under my direct supervision. FLUOROSCOPY TIME:  Fluoroscopy Time: 1 minutes 18 seconds (7.4 mGy). COMPLICATIONS: None PROCEDURE: Informed written consent was obtained from  the patient and the patient's family after a thorough discussion of the procedural risks, benefits and alternatives. All questions were addressed. Maximal Sterile Barrier Technique was utilized including caps, mask, sterile gowns, sterile gloves, sterile drape, hand hygiene and skin antiseptic. A timeout was performed prior to the initiation of the procedure. Ultrasound survey of the right upper quadrant was performed for planning purposes. Once the patient is prepped and draped in the usual sterile fashion, the skin and subcutaneous tissues overlying the gallbladder were generously infiltrated 1% lidocaine for local anesthesia. A coaxial needle was advanced under ultrasound guidance through the skin subcutaneous tissues and a small segment of liver into the gallbladder lumen. With removal of the stylet, spontaneous dark bile drainage occurred. Using modified Seldinger technique, a 10 French drain was placed into the gallbladder fossa, with aspiration of the sample for the lab. Contrast injection confirmed position of the tube within the gallbladder lumen. Drainage catheter was attached to gravity drain with a suture retention placed. Patient tolerated the procedure well and remained hemodynamically stable throughout. No complications were encountered and no significant blood loss encountered. IMPRESSION: Status post percutaneous cholecystostomy. Signed, Dulcy Fanny. Earleen Newport, DO [facility] Electronically Signed   By: Corrie Mckusick D.O.   On: 05/04/2016 16:32   Mr Abdomen Mrcp Moise Boring Contast  Result Date: 05/03/2016 CLINICAL DATA:  83 year old female with history of colon cancer. Two year history of intermittent right upper quadrant abdominal pain. EXAM: MRI ABDOMEN WITHOUT AND WITH CONTRAST (INCLUDING MRCP) TECHNIQUE: Multiplanar multisequence MR imaging of the abdomen was performed both before and after the administration of intravenous contrast. Heavily  T2-weighted images of the biliary and pancreatic ducts were obtained, and three-dimensional MRCP images were rendered by post processing. CONTRAST:  88mL MULTIHANCE GADOBENATE DIMEGLUMINE 529 MG/ML IV SOLN COMPARISON:  CT scan 04/25/2016 and abdominal ultrasound 05/03/2016. FINDINGS: Lower chest: The lung bases demonstrate chronic basilar scarring and emphysema. No effusions or pulmonary lesions. No pericardial effusion. Hepatobiliary: No focal hepatic lesions are identified. Geographic fatty infiltration is noted. There is stable intra and extrahepatic biliary dilatation. The gallbladder is filled with gallstones. The common bile duct is dilated to a maximum of 13.5 mm in the porta hepatis and 11.5 mm in the head of the pancreas. This is a stable finding. No common bile duct stones are identified. Pancreas:  No mass, inflammation or ductal dilatation. Spleen:  Normal size.  No focal lesions. Adrenals/Urinary Tract: The adrenal glands and kidneys are unremarkable. Lower pole right renal cyst is noted. Stomach/Bowel: The stomach, duodenum, visualized small bowel and visualize colon are grossly normal. Vascular/Lymphatic: Stable atherosclerotic calcifications involving the aorta and branch vessels but no aneurysm or dissection. Small scattered mesenteric and retroperitoneal lymph nodes but no mass or overt adenopathy. Other:  No ascites or abdominal wall hernia. Musculoskeletal: No significant bony findings. IMPRESSION: 1. Cholelithiasis but no  findings suspicious for acute cholecystitis and no common bile duct stones. Chronic intra and extrahepatic biliary dilatation. 2. Normal MR appearance of the pancreas. Normal caliber and course of the main pancreatic duct. Electronically Signed   By: Marijo Sanes M.D.   On: 05/03/2016 21:18        Scheduled Meds: . ciprofloxacin  400 mg Intravenous Q12H  . heparin  5,000 Units Subcutaneous Q8H   Continuous Infusions: . dextrose 5 % and 0.9% NaCl 100 mL/hr at  05/04/16 1951     LOS: 2 days    Time spent:40 min    Manpreet Strey, Geraldo Docker, MD [facility] Pager [phone]  If 7PM-7AM, please contact night-coverage [email or web address] Password [email or web address] 05/05/2016, 3:45 PM

## 2016-05-05 NOTE — Progress Notes (Signed)
[facility] Progress Note     Subjective: C/o pain/soreness at drain at drain site. Reports it is "too soon" to know if her abdominal pain is improving s/p drain placement. Tolerating clears. Spoke with pt niece on the phone while in pt room - she reports that she feels the patient is improving with perc drain.   Objective: Vital signs in last 24 hours: Temp:  [97.6 F (36.4 C)-97.8 F (36.6 C)] 97.8 F (36.6 C) (01/11 0433) Pulse Rate:  [58-66] 60 (01/11 0433) Resp:  [14-19] 17 (01/11 0433) BP: (94-130)/(47-61) 114/47 (01/11 0823) SpO2:  [95 %-100 %] 98 % (01/11 0433) Last BM Date: 05/04/16  Intake/Output from previous day: 01/10 0701 - 01/11 0700 In: 2605 [I.V.:2200; IV Piggyback:400] Out: 105 [Drains:105] Intake/Output this shift: Total I/O In: 100 [P.O.:100] Out: -   PE: Gen:  Alert, NAD, pleasant and cooperative Card:  RRR Pulm:  CTA, unlabored Abd: Soft, TTP RUQ, non tender over left hemiabdomen, ND, +BS, drain site c/d/i  Drain 105cc/24h, bilious  Ext:  No erythema, edema, or tenderness  Lab Results:   Recent Labs  05/04/16 1013 05/05/16 0502  WBC 4.2 5.1  HGB 9.2* 9.8*  HCT 29.4* 30.6*  PLT 185 121*   BMET  Recent Labs  05/04/16 1013 05/05/16 0502  NA 138 138  K 3.8 3.8  CL 107 108  CO2 26 25  GLUCOSE 102* 112*  BUN 16 12  CREATININE 0.83 0.87  CALCIUM 7.9* 8.1*   PT/INR  Recent Labs  05/04/16 1013  LABPROT 13.9  INR 1.06   CMP     Component Value Date/Time   NA 138 05/05/2016 0502   NA 140 04/08/2016 1225   K 3.8 05/05/2016 0502   K 3.8 04/08/2016 1225   CL 108 05/05/2016 0502   CO2 25 05/05/2016 0502   CO2 21 (L) 04/08/2016 1225   GLUCOSE 112 (H) 05/05/2016 0502   GLUCOSE 117 04/08/2016 1225   BUN 12 05/05/2016 0502   BUN 17.8 04/08/2016 1225   CREATININE 0.87 05/05/2016 0502   CREATININE 0.8 04/08/2016 1225   CALCIUM 8.1 (L) 05/05/2016 0502   CALCIUM 8.8 04/08/2016 1225   PROT 5.9 (L) 05/05/2016 0502    PROT 6.6 04/08/2016 1225   ALBUMIN 2.8 (L) 05/05/2016 0502   ALBUMIN 3.0 (L) 04/08/2016 1225   AST 99 (H) 05/05/2016 0502   AST 11 04/08/2016 1225   ALT 172 (H) 05/05/2016 0502   ALT 12 04/08/2016 1225   ALKPHOS 377 (H) 05/05/2016 0502   ALKPHOS 101 04/08/2016 1225   BILITOT 0.8 05/05/2016 0502   BILITOT 0.27 04/08/2016 1225   GFRNONAA 57 (L) 05/05/2016 0502   GFRNONAA 43 (L) 09/02/2013 1557   GFRAA >60 05/05/2016 0502   GFRAA 50 (L) 09/02/2013 1557   Lipase     Component Value Date/Time   LIPASE 83 (H) 05/03/2016 1220   Studies/Results: Nm Hepatobiliary Liver Func  Result Date: 05/04/2016 CLINICAL DATA:  Abnormal LFTs. Chronic worsening right upper quadrant abdominal pain. Initial encounter. EXAM: NUCLEAR MEDICINE HEPATOBILIARY IMAGING TECHNIQUE: Sequential images of the abdomen were obtained out to 60 minutes following intravenous administration of radiopharmaceutical. The gallbladder was not visualized. As the patient has an allergy to morphine, slow intravenous infusion of 1.1 mcg of cholecystokinin was performed. RADIOPHARMACEUTICALS:  5.05 mCi Tc-44m Choletec IV COMPARISON:  None. FINDINGS: Prompt uptake and biliary excretion of activity by the liver is seen. Biliary activity passes into small bowel, consistent with patent common  bile duct. The gallbladder is not visualized at 45 minutes after administration of radiopharmaceutical. As the patient has an allergy to morphine, slow intravenous infusion of cholecystokinin was performed. The gallbladder was not visualized at 1 hour following initiation of infusion. This is most compatible with acute cholecystitis, though in some cases, chronic cholecystitis can have a similar appearance. IMPRESSION: Prompt uptake and biliary excretion of activity by the liver. Gallbladder not visualized, even after infusion of cholecystokinin. This is most compatible with acute cholecystitis, though in some cases, chronic cholecystitis can have a similar  appearance. Electronically Signed   By: Garald Balding M.D.   On: 05/04/2016 02:38   US Abdomen Complete  Result Date: 05/03/2016 CLINICAL DATA:  Abdominal pain EXAM: ABDOMEN ULTRASOUND COMPLETE COMPARISON:  04/25/2016 FINDINGS: Gallbladder: Multiple gallstones are again identified stable from the prior CT examination. No gallbladder wall thickening or pericholecystic fluid is noted. Common bile duct: Diameter: Dilated at 11 mm. This is similar to that seen on prior CT examination. Liver: No focal lesion identified. Within normal limits in parenchymal echogenicity. IVC: No abnormality visualized. Pancreas: Visualized portion unremarkable. Spleen: Size and appearance within normal limits. Right Kidney: Length: 9.4 cm. Echogenicity within normal limits. No mass or hydronephrosis visualized. Left Kidney: Length: 9.3 cm. Echogenicity within normal limits. No mass or hydronephrosis visualized. Abdominal aorta: No aneurysm visualized. Other findings: None. IMPRESSION: Cholelithiasis. Common bile duct dilatation stable from the previous exam. Correlation with bilirubin level is recommended. Electronically Signed   By: Inez Catalina M.D.   On: 05/03/2016 15:06   Mr 3d Recon At Scanner  Result Date: 05/03/2016 CLINICAL DATA:  83 year old female with history of colon cancer. Two year history of intermittent right upper quadrant abdominal pain. EXAM: MRI ABDOMEN WITHOUT AND WITH CONTRAST (INCLUDING MRCP) TECHNIQUE: Multiplanar multisequence MR imaging of the abdomen was performed both before and after the administration of intravenous contrast. Heavily T2-weighted images of the biliary and pancreatic ducts were obtained, and three-dimensional MRCP images were rendered by post processing. CONTRAST:  26mL MULTIHANCE GADOBENATE DIMEGLUMINE 529 MG/ML IV SOLN COMPARISON:  CT scan 04/25/2016 and abdominal ultrasound 05/03/2016. FINDINGS: Lower chest: The lung bases demonstrate chronic basilar scarring and emphysema. No  effusions or pulmonary lesions. No pericardial effusion. Hepatobiliary: No focal hepatic lesions are identified. Geographic fatty infiltration is noted. There is stable intra and extrahepatic biliary dilatation. The gallbladder is filled with gallstones. The common bile duct is dilated to a maximum of 13.5 mm in the porta hepatis and 11.5 mm in the head of the pancreas. This is a stable finding. No common bile duct stones are identified. Pancreas:  No mass, inflammation or ductal dilatation. Spleen:  Normal size.  No focal lesions. Adrenals/Urinary Tract: The adrenal glands and kidneys are unremarkable. Lower pole right renal cyst is noted. Stomach/Bowel: The stomach, duodenum, visualized small bowel and visualize colon are grossly normal. Vascular/Lymphatic: Stable atherosclerotic calcifications involving the aorta and branch vessels but no aneurysm or dissection. Small scattered mesenteric and retroperitoneal lymph nodes but no mass or overt adenopathy. Other:  No ascites or abdominal wall hernia. Musculoskeletal: No significant bony findings. IMPRESSION: 1. Cholelithiasis but no findings suspicious for acute cholecystitis and no common bile duct stones. Chronic intra and extrahepatic biliary dilatation. 2. Normal MR appearance of the pancreas. Normal caliber and course of the main pancreatic duct. Electronically Signed   By: Marijo Sanes M.D.   On: 05/03/2016 21:18   Ir Perc Cholecystostomy  Result Date: 05/04/2016 INDICATION: 83 year old female with acute cholecystitis  EXAM: CHOLECYSTOSTOMY MEDICATIONS: Patient is receiving Cipro in the hospital; The antibiotic was administered within an appropriate time frame prior to the initiation of the procedure. ANESTHESIA/SEDATION: Moderate (conscious) sedation was employed during this procedure. A total of Versed 2 point mg and Fentanyl 50 mcg was administered intravenously. Moderate Sedation Time: 20 minutes. The patient's level of consciousness and vital signs  were monitored continuously by radiology nursing throughout the procedure under my direct supervision. FLUOROSCOPY TIME:  Fluoroscopy Time: 1 minutes 18 seconds (7.4 mGy). COMPLICATIONS: None PROCEDURE: Informed written consent was obtained from the patient and the patient's family after a thorough discussion of the procedural risks, benefits and alternatives. All questions were addressed. Maximal Sterile Barrier Technique was utilized including caps, mask, sterile gowns, sterile gloves, sterile drape, hand hygiene and skin antiseptic. A timeout was performed prior to the initiation of the procedure. Ultrasound survey of the right upper quadrant was performed for planning purposes. Once the patient is prepped and draped in the usual sterile fashion, the skin and subcutaneous tissues overlying the gallbladder were generously infiltrated 1% lidocaine for local anesthesia. A coaxial needle was advanced under ultrasound guidance through the skin subcutaneous tissues and a small segment of liver into the gallbladder lumen. With removal of the stylet, spontaneous dark bile drainage occurred. Using modified Seldinger technique, a 10 French drain was placed into the gallbladder fossa, with aspiration of the sample for the lab. Contrast injection confirmed position of the tube within the gallbladder lumen. Drainage catheter was attached to gravity drain with a suture retention placed. Patient tolerated the procedure well and remained hemodynamically stable throughout. No complications were encountered and no significant blood loss encountered. IMPRESSION: Status post percutaneous cholecystostomy. Signed, Dulcy Fanny. Earleen Newport, DO [facility] Electronically Signed   By: Corrie Mckusick D.O.   On: 05/04/2016 16:32   Mr Abdomen Mrcp Moise Boring Contast  Result Date: 05/03/2016 CLINICAL DATA:  83 year old female with history of colon cancer. Two year history of intermittent right  upper quadrant abdominal pain. EXAM: MRI ABDOMEN WITHOUT AND WITH CONTRAST (INCLUDING MRCP) TECHNIQUE: Multiplanar multisequence MR imaging of the abdomen was performed both before and after the administration of intravenous contrast. Heavily T2-weighted images of the biliary and pancreatic ducts were obtained, and three-dimensional MRCP images were rendered by post processing. CONTRAST:  50mL MULTIHANCE GADOBENATE DIMEGLUMINE 529 MG/ML IV SOLN COMPARISON:  CT scan 04/25/2016 and abdominal ultrasound 05/03/2016. FINDINGS: Lower chest: The lung bases demonstrate chronic basilar scarring and emphysema. No effusions or pulmonary lesions. No pericardial effusion. Hepatobiliary: No focal hepatic lesions are identified. Geographic fatty infiltration is noted. There is stable intra and extrahepatic biliary dilatation. The gallbladder is filled with gallstones. The common bile duct is dilated to a maximum of 13.5 mm in the porta hepatis and 11.5 mm in the head of the pancreas. This is a stable finding. No common bile duct stones are identified. Pancreas:  No mass, inflammation or ductal dilatation. Spleen:  Normal size.  No focal lesions. Adrenals/Urinary Tract: The adrenal glands and kidneys are unremarkable. Lower pole right renal cyst is noted. Stomach/Bowel: The stomach, duodenum, visualized small bowel and visualize colon are grossly normal. Vascular/Lymphatic: Stable atherosclerotic calcifications involving the aorta and branch vessels but no aneurysm or dissection. Small scattered mesenteric and retroperitoneal lymph nodes but no mass or overt adenopathy. Other:  No ascites or abdominal wall hernia. Musculoskeletal: No significant bony findings. IMPRESSION: 1. Cholelithiasis but no findings suspicious for acute cholecystitis and no common bile  duct stones. Chronic intra and extrahepatic biliary dilatation. 2. Normal MR appearance of the pancreas. Normal caliber and course of the main pancreatic duct. Electronically  Signed   By: Marijo Sanes M.D.   On: 05/03/2016 21:18   Anti-infectives: Anti-infectives    Start     Dose/Rate Route Frequency Ordered Stop   05/04/16 1200  ciprofloxacin (CIPRO) IVPB 400 mg     400 mg 200 mL/hr over 60 Minutes Intravenous Every 12 hours 05/04/16 1056       Assessment/Plan Stage Stage IV colon cancer with porta hepatis adenopathy- currently on Keytruda  Transaminates  Cholecystitis vs CBD obstruction - HIDA 1/9 showed no gallbladder filling - high risk sugical candidate given porta hepatis nodes and CBD dilation.   - 1/10 IR placed 33F cholecystostomy tube - GS gram positive cocci in pairs, Cx pending  - LFTs remain elevated this AM but are trending downward - WBC WNL (5.1), hgb/hct stable  - tolerating clear liquids, will advance to full liquids  Plan: advance diet, follow drain cultures, abdominal exams and pain control   LOS: 2 days    Jill Alexanders , [facility] 05/05/2016, 9:46 AM Pager: [phone] Consults: [phone] Mon-Fri 7:00 am-4:30 pm Sat-Sun 7:00 am-11:30 am

## 2016-05-06 DIAGNOSIS — Z7189 Other specified counseling: Secondary | ICD-10-CM

## 2016-05-06 DIAGNOSIS — Z515 Encounter for palliative care: Secondary | ICD-10-CM

## 2016-05-06 DIAGNOSIS — R1011 Right upper quadrant pain: Secondary | ICD-10-CM

## 2016-05-06 DIAGNOSIS — K8063 Calculus of gallbladder and bile duct with acute cholecystitis with obstruction: Secondary | ICD-10-CM

## 2016-05-06 DIAGNOSIS — C189 Malignant neoplasm of colon, unspecified: Secondary | ICD-10-CM

## 2016-05-06 DIAGNOSIS — K811 Chronic cholecystitis: Secondary | ICD-10-CM

## 2016-05-06 MED ORDER — CIPROFLOXACIN HCL 500 MG PO TABS
500.0000 mg | ORAL_TABLET | Freq: Two times a day (BID) | ORAL | Status: DC
  Administered 2016-05-06 – 2016-05-08 (×5): 500 mg via ORAL
  Filled 2016-05-06 (×5): qty 1

## 2016-05-06 NOTE — Progress Notes (Signed)
PROGRESS NOTE    Joann Herrera  [id number] DOB: [birth year] DOA: 05/03/2016 PCP: [name], MD     Brief Narrative:  83 y.o. WF PMHx Colon CA with suspected Metastases, squamous cell Cancer  Presenting with abdominal pain. She reports about 2 years of waxing/waning RUQ abdominal pain that is severe, aching, radiating to the back, sometimes worse with food. Has had nausea and vomiting in the past, but not today. Pain better with tylenol and po dilaudid. Has been eating less today and diffusely weak. She had been seen 4 times in the past 2 weeks for similar complaints.    Subjective: 1/12 A/O 4, appropriate pain to right lateral thoracic lateral Cavity consistent with placement of drain. Patient short-term memory in question. Asked the same question over and over again, once answered will ask question again in 5 minutes.   Assessment & Plan:   Principal Problem:   Abdominal pain Active Problems:   GERD (gastroesophageal reflux disease)   Cholelithiasis   Dilated cbd, acquired   Cancer of left colon (HCC)   Dehydration   Constipation   Cholecystitis, acute   Abnormal LFTs   Calculus of gallbladder and bile duct with cholecystitis with obstruction   Cholecystitis, chronic   Chronic cholecystitis   Adenocarcinoma, colon (HCC)   Right sided abdominal pain   Encounter for palliative care   Goals of care, counseling/discussion   Chronic calculus cholecystitis -This maybe in part the reason for her chronic abdominal pain -05/04/16--HIDA--no GB filling suggesting acute cholecystitis -05/03/16--MRCP--cholelithiasis with choledocholithiasis, chronic intra-and extra biliary dilatation -05/04/16--perc cholecystotomy tube placed as pt is high risk surgical candidate -continue cipro -Trend LFTs  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 on reduced-dose Beryle Flock. CT 01/29/16 showed stable disease. Recent  plan was to schedule PET scan.  - Will add [name] to treatment team. - Consider bowel regimen for chronic constipation once taking po - Previously declined hospice and surgery.  -Distended and S4 consult request for physicals. Discussion as there is no -consult palliative care for ongoing goals of care discussion. -Suspect her subacute worsening weakness is related to malignancy.  Dehydration -continue IVF  FTT -PT eval -nutrition consult  Constipation -Patient had a bowel movement this morning -needs regular bowel regimen once tolerating po   DVT prophylaxis: Subcutaneous heparin Code Status: Full Family Communication: None Disposition Plan: Patient requested a home with hospice. Awaiting PALLIATIVE CARE recommendations.   Consultants:   [facility]    Procedures/Significant Events:  1/10 S/P cholecystostomy  drain will need to remain in place for at least 4-6 weeks to allow for tract maturity before removing    VENTILATOR SETTINGS:    Cultures  Antimicrobials:    Devices   LINES / TUBES:      Continuous Infusions: . dextrose 5 % and 0.9% NaCl 100 mL/hr at 05/06/16 1411     Objective: Vitals:   05/05/16 0823 05/05/16 1417 05/06/16 0559 05/06/16 1414  BP: (!) 114/47 (!) 103/48 (!) 98/45 (!) 97/45  Pulse:  60 65 63  Resp:  16 16 16   Temp:  97.7 F (36.5 C) 98.6 F (37 C) 98.7 F (37.1 C)  TempSrc:  Oral Oral Oral  SpO2:  97% 95% 100%  Weight:      Height:        Intake/Output Summary (Last 24 hours) at 05/06/16 1419 Last data filed at 05/06/16 0600  Gross per 24 hour  Intake  2705 ml  Output              325 ml  Net             2380 ml   Filed Weights   05/03/16 1022  Weight: 55.3 kg (122 lb)    Examination:  General: A/O 4, cachectic, appropriately tender for recent drain placement, No acute respiratory distress Eyes: negative scleral hemorrhage, negative anisocoria, negative  icterus ENT: Negative Runny nose, negative gingival bleeding, Neck:  Negative scars, masses, torticollis, lymphadenopathy, JVD Lungs: Clear to auscultation bilaterally without wheezes or crackles Cardiovascular: Regular rate and rhythm without murmur gallop or rub normal S1 and S2 Abdomen: negative abdominal pain, nondistended, positive soft, bowel sounds, no rebound, no ascites, no appreciable mass, cholecystectomy drain in place right chest wall Extremities:: No significant cyanosis, clubbing, or edema bilateral lower extremities Skin: Negative rashes, lesions, ulcers Psychiatric:  Negative depression, negative anxiety, negative fatigue, negative mania  Central nervous system:  Cranial nerves II through XII intact, tongue/uvula midline, all extremities muscle strength 5/5, sensation intact throughout,negative dysarthria, negative expressive aphasia, negative receptive aphasia.  .     Data Reviewed: Care during the described time interval was provided by me .  I have reviewed this patient's available data, including medical history, events of note, physical examination, and all test results as part of my evaluation. I have personally reviewed and interpreted all radiology studies.  CBC:  Recent Labs Lab 05/01/16 0255 05/03/16 1220 05/04/16 1013 05/05/16 0502  WBC 5.9 8.2 4.2 5.1  NEUTROABS 3.2 6.0  --   --   HGB 10.8* 11.2* 9.2* 9.8*  HCT 33.3* 34.9* 29.4* 30.6*  MCV 88.1 86.4 86.7 88.7  PLT 292 284 185 [id number]*   Basic Metabolic Panel:  Recent Labs Lab 05/01/16 0255 05/03/16 1220 05/04/16 1013 05/05/16 0502  NA 136 136 138 138  K 4.3 4.3 3.8 3.8  CL 104 102 107 108  CO2 23 27 26 25   GLUCOSE 109* 109* 102* 112*  BUN 18 21* 16 12  CREATININE 0.68 0.95 0.83 0.87  CALCIUM 8.3* 8.6* 7.9* 8.1*   GFR: Estimated Creatinine Clearance: 37.1 mL/min (by C-G formula based on SCr of 0.87 mg/dL). Liver Function Tests:  Recent Labs Lab 05/01/16 0255 05/03/16 1220 05/04/16 1013  05/05/16 0502  AST 26 431* 237* 99*  ALT 43 143* 254* 172*  ALKPHOS 314* 550* 462* 377*  BILITOT 0.3 0.8 0.6 0.8  PROT 6.3* 6.5 5.8* 5.9*  ALBUMIN 3.1* 3.3* 2.9* 2.8*    Recent Labs Lab 05/01/16 0255 05/03/16 1220  LIPASE 83* 83*   No results for input(s): AMMONIA in the last 168 hours. Coagulation Profile:  Recent Labs Lab 05/04/16 1013  INR 1.06   Cardiac Enzymes: No results for input(s): CKTOTAL, CKMB, CKMBINDEX, TROPONINI in the last 168 hours. BNP (last 3 results) No results for input(s): PROBNP in the last 8760 hours. HbA1C: No results for input(s): HGBA1C in the last 72 hours. CBG: No results for input(s): GLUCAP in the last 168 hours. Lipid Profile: No results for input(s): CHOL, HDL, LDLCALC, TRIG, CHOLHDL, LDLDIRECT in the last 72 hours. Thyroid Function Tests: No results for input(s): TSH, T4TOTAL, FREET4, T3FREE, THYROIDAB in the last 72 hours. Anemia Panel: No results for input(s): VITAMINB12, FOLATE, FERRITIN, TIBC, IRON, RETICCTPCT in the last 72 hours. Sepsis Labs: No results for input(s): PROCALCITON, LATICACIDVEN in the last 168 hours.  Recent Results (from the past 240 hour(s))  Aerobic/Anaerobic Culture (surgical/deep wound)  Status: None (Preliminary result)   Collection Time: 05/04/16  4:21 PM  Result Value Ref Range Status   Specimen Description GALL BLADDER BILE  Final   Special Requests NONE  Final   Gram Stain   Final    ABUNDANT WBC PRESENT, PREDOMINANTLY PMN RARE GRAM POSITIVE COCCI IN PAIRS    Culture   Final    NO GROWTH 2 DAYS NO ANAEROBES ISOLATED; CULTURE IN PROGRESS FOR 5 DAYS Performed at [facility]    Report Status PENDING  Incomplete         Radiology Studies: Ir Perc Cholecystostomy  Result Date: 05/04/2016 INDICATION: 82 year old female with acute cholecystitis EXAM: CHOLECYSTOSTOMY MEDICATIONS: Patient is receiving Cipro in the hospital; The antibiotic was administered within an appropriate time frame  prior to the initiation of the procedure. ANESTHESIA/SEDATION: Moderate (conscious) sedation was employed during this procedure. A total of Versed 2 point mg and Fentanyl 50 mcg was administered intravenously. Moderate Sedation Time: 20 minutes. The patient's level of consciousness and vital signs were monitored continuously by radiology nursing throughout the procedure under my direct supervision. FLUOROSCOPY TIME:  Fluoroscopy Time: 1 minutes 18 seconds (7.4 mGy). COMPLICATIONS: None PROCEDURE: Informed written consent was obtained from the patient and the patient's family after a thorough discussion of the procedural risks, benefits and alternatives. All questions were addressed. Maximal Sterile Barrier Technique was utilized including caps, mask, sterile gowns, sterile gloves, sterile drape, hand hygiene and skin antiseptic. A timeout was performed prior to the initiation of the procedure. Ultrasound survey of the right upper quadrant was performed for planning purposes. Once the patient is prepped and draped in the usual sterile fashion, the skin and subcutaneous tissues overlying the gallbladder were generously infiltrated 1% lidocaine for local anesthesia. A coaxial needle was advanced under ultrasound guidance through the skin subcutaneous tissues and a small segment of liver into the gallbladder lumen. With removal of the stylet, spontaneous dark bile drainage occurred. Using modified Seldinger technique, a 10 French drain was placed into the gallbladder fossa, with aspiration of the sample for the lab. Contrast injection confirmed position of the tube within the gallbladder lumen. Drainage catheter was attached to gravity drain with a suture retention placed. Patient tolerated the procedure well and remained hemodynamically stable throughout. No complications were encountered and no significant blood loss encountered. IMPRESSION: Status post percutaneous cholecystostomy. Signed, Dulcy Fanny. Earleen Newport, DO  [facility] Electronically Signed   By: Corrie Mckusick D.O.   On: 05/04/2016 16:32        Scheduled Meds: . ciprofloxacin  500 mg Oral BID  . heparin  5,000 Units Subcutaneous Q8H   Continuous Infusions: . dextrose 5 % and 0.9% NaCl 100 mL/hr at 05/06/16 1411     LOS: 3 days    Time spent:40 min    WOODS, Geraldo Docker, MD [facility] Pager [phone]  If 7PM-7AM, please contact night-coverage [email or web address] Password [email or web address] 05/06/2016, 2:19 PM

## 2016-05-06 NOTE — Progress Notes (Signed)
Joann Herrera   DOB:10-Sep-1923   KB#:[id number]   BPJ#:[id number]  Oncology follow-up note  Subjective: Patient underwent percutaneous gallbladder change by IR 2 days ago, complains of abdominal pain at the draining site. She has started regular diet again, tolerated well. Afebrile, VS stable. LFT improving   Objective:  Vitals:   05/06/16 1414 05/06/16 2203  BP: (!) 97/45 (!) 124/51  Pulse: 63 64  Resp: 16 16  Temp: 98.7 F (37.1 C) 98.5 F (36.9 C)    Body mass index is 20.94 kg/m.  Intake/Output Summary (Last 24 hours) at 05/06/16 2256 Last data filed at 05/06/16 2100  Gross per 24 hour  Intake          1673.33 ml  Output              125 ml  Net          1548.33 ml     Sclerae unicteric  Oropharynx clear  No peripheral adenopathy  Lungs clear -- no rales or rhonchi  Heart regular rate and rhythm  Abdomen soft, (+) RUQ tenderness, biliary draining tube in RUQ  MSK no focal spinal tenderness, no peripheral edema  Neuro nonfocal   CBG (last 3)  No results for input(s): GLUCAP in the last 72 hours.   Labs:  Lab Results  Component Value Date   WBC 5.1 05/05/2016   HGB 9.8 (L) 05/05/2016   HCT 30.6 (L) 05/05/2016   MCV 88.7 05/05/2016   PLT 121 (L) 05/05/2016   NEUTROABS 6.0 05/03/2016    CMP Latest Ref Rng & Units 05/05/2016 05/04/2016 05/03/2016  Glucose 65 - 99 mg/dL 112(H) 102(H) 109(H)  BUN 6 - 20 mg/dL 12 16 21(H)  Creatinine 0.44 - 1.00 mg/dL 0.87 0.83 0.95  Sodium 135 - 145 mmol/L 138 138 136  Potassium 3.5 - 5.1 mmol/L 3.8 3.8 4.3  Chloride 101 - 111 mmol/L 108 107 102  CO2 22 - 32 mmol/L _0 Calcium 8.9 - 10.3 mg/dL 8.1(L) 7.9(L) 8.6(L)  Total Protein 6.5 - 8.1 g/dL 5.9(L) 5.8(L) 6.5  Total Bilirubin 0.3 - 1.2 mg/dL 0.8 0.6 0.8  Alkaline Phos 38 - 126 U/L 377(H) 462(H) 550(H)  AST 15 - 41 U/L 99(H) 237(H) 431(H)  ALT 14 - 54 U/L 172(H) 254(H) 143(H)     Urine Studies No results for input(s): UHGB, CRYS in the last 72 hours.  Invalid  input(s): UACOL, UAPR, USPG, UPH, UTP, UGL, UKET, UBIL, UNIT, UROB, [place], UEPI, UWBC, Duwayne Heck [place], [place]  Basic Metabolic Panel:  Recent Labs Lab 05/01/16 0255 05/03/16 1220 05/04/16 1013 05/05/16 0502  NA 136 136 138 138  K 4.3 4.3 3.8 3.8  CL 104 102 107 108  CO2 _1 GLUCOSE 109* 109* 102* 112*  BUN 18 21* 16 12  CREATININE 0.68 0.95 0.83 0.87  CALCIUM 8.3* 8.6* 7.9* 8.1*   GFR Estimated Creatinine Clearance: 37.1 mL/min (by C-G formula based on SCr of 0.87 mg/dL). Liver Function Tests:  Recent Labs Lab 05/01/16 0255 05/03/16 1220 05/04/16 1013 05/05/16 0502  AST 26 431* 237* 99*  ALT 43 143* 254* 172*  ALKPHOS 314* 550* 462* 377*  BILITOT 0.3 0.8 0.6 0.8  PROT 6.3* 6.5 5.8* 5.9*  ALBUMIN 3.1* 3.3* 2.9* 2.8*    Recent Labs Lab 05/01/16 0255 05/03/16 1220  LIPASE 83* 83*   No results for input(s): AMMONIA in the last 168 hours. Coagulation profile  Recent Labs Lab  05/04/16 1013  INR 1.06    CBC:  Recent Labs Lab 05/01/16 0255 05/03/16 1220 05/04/16 1013 05/05/16 0502  WBC 5.9 8.2 4.2 5.1  NEUTROABS 3.2 6.0  --   --   HGB 10.8* 11.2* 9.2* 9.8*  HCT 33.3* 34.9* 29.4* 30.6*  MCV 88.1 86.4 86.7 88.7  PLT 292 284 185 121*   Cardiac Enzymes: No results for input(s): CKTOTAL, CKMB, CKMBINDEX, TROPONINI in the last 168 hours. BNP: Invalid input(s): POCBNP CBG: No results for input(s): GLUCAP in the last 168 hours. D-Dimer No results for input(s): DDIMER in the last 72 hours. Hgb A1c No results for input(s): HGBA1C in the last 72 hours. Lipid Profile No results for input(s): CHOL, HDL, LDLCALC, TRIG, CHOLHDL, LDLDIRECT in the last 72 hours. Thyroid function studies No results for input(s): TSH, T4TOTAL, T3FREE, THYROIDAB in the last 72 hours.  Invalid input(s): FREET3 Anemia work up No results for input(s): VITAMINB12, FOLATE, FERRITIN, TIBC, IRON, RETICCTPCT in the last 72 hours. Microbiology Recent Results (from the  past 240 hour(s))  Aerobic/Anaerobic Culture (surgical/deep wound)     Status: None (Preliminary result)   Collection Time: 05/04/16  4:21 PM  Result Value Ref Range Status   Specimen Description GALL BLADDER BILE  Final   Special Requests NONE  Final   Gram Stain   Final    ABUNDANT WBC PRESENT, PREDOMINANTLY PMN RARE GRAM POSITIVE COCCI IN PAIRS    Culture   Final    NO GROWTH 2 DAYS NO ANAEROBES ISOLATED; CULTURE IN PROGRESS FOR 5 DAYS Performed at [facility]    Report Status PENDING  Incomplete      Studies:  No results found.  Assessment: 83 y.o. with metastatic colon cancer, on Keytruda   1. Cholelithiasis with chronic cholecystitis 2. Metastatic colon cancer to peritoneum 3. Transaminitis 4. Abdominal pain  5. Moderate Malnutrition   Plan:  -Her liver function is improving after percutaneous cholecystectomy tube placement, but she complains of pain, tolerates diet OK -she is on cipro -We again reviewed the high-risk of cholecystectomy, and difficulty in managing her biliary obstruction. If she recovers well, she would like to follow up with surgery and GI, and see if biliary stent placement is beneficial and feasible  -If her chronic cholecystitis does not resolve, I will not continue Keytruda. I do not think she is a candidate for other anticancer treatment options such as chemo, due to her advanced age, comorbidities and poor performance status. -I again reviewed the option of palliative care and hospice. If her chronic cholecystitis cannot be managed by surgery or stenting, I think she will likely have persistent abdominal pain, and it will be very difficult for her to come to clinic for follow-up and treatment. I again explained the role and the benefit of hospice, and encouraged her to reconsider. She previously declined hospice. She has met palliative care team in hospital, still wants to be full code and continue anticancer treatment. -After a lengthy  discussion, patient and her son voiced good understanding, will think about the option of hospice again. She may want to wait and see how she does in the next few weeks, and then make a decision from there.  -If she will be discharged home this weekend, I will set up her f/u appointment in 2-3 weeks.    Truitt Merle, MD 05/06/2016

## 2016-05-06 NOTE — Consult Note (Signed)
Consultation Note Date: 05/06/2016   Patient Name: Joann Herrera  DOB: 03-May-1923  MRN: [id number]  Age / Sex: 83 y.o., female  PCP: [name], MD Referring Physician: Allie Bossier, MD  Reason for Consultation: Establishing goals of care  HPI/Patient Profile: 83 y.o. female  with past medical history of colon cancer admitted on 05/03/2016 with abdominal pain.   Clinical Assessment and Goals of Care:  83 y.o. WF PMHx Colon CA with suspected Metastases, squamous cell Cancer   Presenting with abdominal pain. She reports about 2 years of waxing/waning RUQ abdominal pain that is severe, aching, radiating to the back, sometimes worse with food. Has had nausea and vomiting in the past, but not today. Pain better with tylenol and po dilaudid. Has been eating less and diffusely weak.    Active hospital issues include: Stage Stage IV colon cancer with porta hepatis adenopathy- currently on Keytruda  Transaminitis  Cholecystitis vs CBD obstruction - HIDA 1/9 showed no gallbladder filling - high risk sugical candidate given porta hepatis nodes and CBD dilation.    - 1/10 IR placed 39F cholecystostomy tube - GS gram positive cocci in pairs, Cx no growth 2 days; on cipro - LFTs trending down, no leukocytosis - As per surgery, to advance diet as tolerated  A palliative consult has been placed for goals of care discussions. Patient is resting in bed, complaining of pain in her RUQ area. Son is at bedside. I introduced myself and palliative care as follows: Palliative medicine is specialized medical care for people living with serious illness. It focuses on providing relief from the symptoms and stress of a serious illness. The goal is to improve quality of life for both the patient and the family.  See recommendations below:   NEXT OF KIN  son [name].   SUMMARY OF RECOMMENDATIONS   1. Code status  discussions undertaken: son states that patient met with palliative provider, possibly from [facility] at home recently, he is not sure whether or not the patient consented to a DNR. He will try to find (?) MOST form document. I have placed a call to Billey Chang NP with [facility]) to see if he was indeed the provider who met with the patient at home recently.  2. Goals however at this time, are not hospice philosophy. Son states that he would like for the patient to [facility] gall bladder surgery in the future. He also wishes for repeat PET scan and resumption of [place]. Gentle discussions undertaken about how hospice/palliative services can help the patient undertaken, not well received.  3. Continue current pain management, patient's son is concerned that the patient is being over medicated with IV Dilaudid, discussed with him about judiciously using opioids and balancing awakeness/alert ness PO intake with adequate pain control.   It is noted that the patient's daughter is coming into town from another state over the weekend, patient has no spouse, 3 kids, one son who is not involved much in the  patient's care, one son Mr [name] whom I met at the bedside. I have given Mr Allean Found my card and have requested for him to call me with a date/time that works best for everyone for a family meeting this weekend for extensive goals of care discussions.   Thank you for the consult. We will continue to follow along, continue conversations, follow hospital course and help guide decision making.   Code Status/Advance Care Planning:  Full code for now, ? DNR discussions recently at the patient's home by out patient palliative. Trying to obtain documentation.     Symptom Management:    continue current measures.   Palliative Prophylaxis:   Bowel Regimen  Additional Recommendations (Limitations, Scope, Preferences):  Full Scope Treatment  Psycho-social/Spiritual:    Desire for further Chaplaincy support:no  Additional Recommendations: Education on Hospice  Prognosis:   guarded  Discharge Planning: To Be Determined      Primary Diagnoses: Present on Admission: . Abdominal pain . Cancer of left colon ([place]) . Cholelithiasis . Constipation . Dehydration . Dilated cbd, acquired . GERD (gastroesophageal reflux disease)   I have reviewed the medical record, interviewed the patient and family, and examined the patient. The following aspects are pertinent.  Past Medical History:  Diagnosis Date  . Cancer (HCC)    squamous cell  . Colon carcinoma ([place])    dx. left Colon cancer,evidence of metastasis  . Complication of anesthesia    "morphine caused severe shakes"  . GERD (gastroesophageal reflux disease)   . Macular degeneration   . Pneumonia 04/2012  . PONV (postoperative nausea and vomiting)   . Rheumatoid Arthritis    Social History   Social History  . Marital status: Widowed    Spouse name: N/A  . Number of children: 3  . Years of education: N/A   Occupational History  . Retired    Social History Main Topics  . Smoking status: Current Every Day Smoker    Packs/day: 1.00  . Smokeless tobacco: Never Used  . Alcohol use No  . Drug use: No  . Sexual activity: Not Asked   Other Topics Concern  . None   Social History Narrative   ** Merged History Encounter **       Daughter in [place] I[place] Boot[place]) [phone]   Son in [place]   Son in [place]   Retired- Medical sales representative business, worked at [employer] for 28 years   Enjoys dancing and going to [place]   Completed 11th grade   Very poor vision due to macular degeneration         Family History  Problem Relation Age of Onset  . Cancer Sister 37    breast  . Diabetes Sister   . Arthritis Mother   . Arthritis-Osteo Mother   . Diabetes Brother   . Suicidality Brother   . Diabetes Brother   . Breast cancer Sister   . Cancer  Maternal Aunt     breast cancer    Scheduled Meds: . ciprofloxacin  400 mg Intravenous Q12H  . heparin  5,000 Units Subcutaneous Q8H   Continuous Infusions: . dextrose 5 % and 0.9% NaCl 100 mL/hr at 05/06/16 0549   PRN Meds:.HYDROmorphone, ondansetron, oxyCODONE-acetaminophen, sodium chloride flush Medications Prior to Admission:  Prior to Admission medications   Medication Sig Start Date End Date Taking? Authorizing Provider  acetaminophen (TYLENOL) 500 MG tablet Take 1,000 mg by mouth every 6 (six) hours as needed for  mild pain or moderate pain.    Yes Historical Provider, MD  HYDROmorphone (DILAUDID) 2 MG tablet Take 0.5-1 tablets (1-2 mg total) by mouth every 4 (four) hours as needed for severe pain. 04/29/16  Yes Truitt Merle, MD  lidocaine-prilocaine (EMLA) cream Apply 1 application topically as needed. Patient taking differently: Apply 1 application topically daily as needed (port access).  04/08/16  Yes Owens Shark, NP  ondansetron (ZOFRAN) 8 MG tablet Take 1 tablet (8 mg total) by mouth every 8 (eight) hours as needed for nausea or vomiting. 04/29/16  Yes Truitt Merle, MD  Polyethyl Glycol-Propyl Glycol (SYSTANE OP) Apply 1-2 drops to eye 4 (four) times daily as needed (for dry eye).   Yes Historical Provider, MD  traMADol (ULTRAM) 50 MG tablet Take 1 tablet (50 mg total) by mouth every 8 (eight) hours as needed. Patient taking differently: Take 50 mg by mouth every 8 (eight) hours as needed for moderate pain or severe pain.  04/08/16  Yes Owens Shark, NP  ondansetron (ZOFRAN ODT) 4 MG disintegrating tablet Take 1 tablet (4 mg total) by mouth every 8 (eight) hours as needed for nausea or vomiting. Patient not taking: Reported on 05/03/2016 04/25/16   Carlisle Cater, PA-C   Allergies  Allergen Reactions  . Aspirin Other (See Comments)    Reaction: G I upset, hurts bladder  . Codeine Nausea And Vomiting  . Morphine And Related Nausea And Vomiting  . Penicillins Hives and Swelling    Has  patient had a PCN reaction causing immediate rash, facial/tongue/throat swelling, SOB or lightheadedness with hypotension: Yes Has patient had a PCN reaction causing severe rash involving mucus membranes or skin necrosis: No Has patient had a PCN reaction that required hospitalization No Has patient had a PCN reaction occurring within the last 10 years: No If all of the above answers are "NO", then may proceed with Cephalosporin use.    Review of Systems + abdominal RUQ pain.   Physical Exam Weak appearing elderly lady Clear breath sounds S1 S2 Abdomen soft painful RUQ area drain + No edema Awake, some what alert Converses some   Vital Signs: BP (!) 98/45 (BP Location: Left Arm)   Pulse 65   Temp 98.6 F (37 C) (Oral)   Resp 16   Ht _0  (1.626 m)   Wt 55.3 kg (122 lb)   SpO2 95%   BMI 20.94 kg/m  Pain Assessment: 0-10   Pain Score: 2    SpO2: SpO2: 95 % O2 Device:SpO2: 95 % O2 Flow Rate: .O2 Flow Rate (L/min): 2 L/min  IO: Intake/output summary:  Intake/Output Summary (Last 24 hours) at 05/06/16 1111 Last data filed at 05/06/16 0600  Gross per 24 hour  Intake             2905 ml  Output              325 ml  Net             2580 ml    LBM: Last BM Date: 05/04/16 Baseline Weight: Weight: 55.3 kg (122 lb) Most recent weight: Weight: 55.3 kg (122 lb)     Palliative Assessment/Data:     Time In:  10.15 Time Out: 11.15 Time Total:  60 min  Greater than 50%  of this time was spent counseling and coordinating care related to the above assessment and plan.  Signed by: Loistine Chance, MD  [phone]  Please contact Palliative Medicine Team  phone at [phone] for questions and concerns.  For individual provider: See Shea Evans

## 2016-05-06 NOTE — Progress Notes (Signed)
OT Cancellation Note  Patient Details Name: Joann Herrera MRN: [id number] DOB: [birth year]   Cancelled Treatment:    Reason Eval/Treat Not Completed: Other (comment).  Pt just took pain medication and doesn't feel up to moving around at this time. Will check back Monday as schedule permits.  Spoke to son:  He doesn't feel she will need a 3:1. She has a riser and bathroom is close to her bedroom.  Tate Jerkins 05/06/2016, 2:52 PM  Lesle Chris, OTR/L [phone] 05/06/2016

## 2016-05-06 NOTE — Progress Notes (Signed)
[facility] Progress Note     Subjective: Reports that her abdominal pain is improved compared to when she was in the ED. Still with significant soreness around drain site - worse with coughing, breathing, moving. Denies nausea or vomiting today. +flatus. Denies BM.  Objective: Vital signs in last 24 hours: Temp:  [97.7 F (36.5 C)-98.6 F (37 C)] 98.6 F (37 C) (01/12 0559) Pulse Rate:  [60-65] 65 (01/12 0559) Resp:  [16] 16 (01/12 0559) BP: (98-103)/(45-48) 98/45 (01/12 0559) SpO2:  [95 %-97 %] 95 % (01/12 0559) Last BM Date: 05/04/16  Intake/Output from previous day: 01/11 0701 - 01/12 0700 In: 3010 [P.O.:100; I.V.:2500; IV Piggyback:400] Out: 325 [Drains:325] Intake/Output this shift: No intake/output data recorded.  PE: Gen:  Alert, NAD, pleasant and cooperative Card:  RRR Pulm:  CTA, unlabored Abd: Soft, TTP RUQ, non tender over left hemiabdomen, ND, +BS, drain site c/d/i             Drain 325 cc/24h, bilious  Ext:  No erythema, edema, or tenderness  Lab Results:  Hepatic Function Latest Ref Rng & Units 05/05/2016 05/04/2016 05/03/2016  Total Protein 6.5 - 8.1 g/dL 5.9(L) 5.8(L) 6.5  Albumin 3.5 - 5.0 g/dL 2.8(L) 2.9(L) 3.3(L)  AST 15 - 41 U/L 99(H) 237(H) 431(H)  ALT 14 - 54 U/L 172(H) 254(H) 143(H)  Alk Phosphatase 38 - 126 U/L 377(H) 462(H) 550(H)  Total Bilirubin 0.3 - 1.2 mg/dL 0.8 0.6 0.8  Bilirubin, Direct 0.0 - 0.3 mg/dL - - -   CBC    Component Value Date/Time   WBC 5.1 05/05/2016 0502   RBC 3.45 (L) 05/05/2016 0502   HGB 9.8 (L) 05/05/2016 0502   HGB 11.4 (L) 04/08/2016 1225   HCT 30.6 (L) 05/05/2016 0502   HCT 35.4 04/08/2016 1225   PLT 121 (L) 05/05/2016 0502   PLT 239 04/08/2016 1225   MCV 88.7 05/05/2016 0502   MCV 87.4 04/08/2016 1225   MCH 28.4 05/05/2016 0502   MCHC 32.0 05/05/2016 0502   RDW 14.0 05/05/2016 0502   RDW 14.7 (H) 04/08/2016 1225   LYMPHSABS 1.2 05/03/2016 1220   LYMPHSABS 1.2 04/08/2016 1225   MONOABS 0.7  05/03/2016 1220   MONOABS 0.4 04/08/2016 1225   EOSABS 0.2 05/03/2016 1220   EOSABS 0.3 04/08/2016 1225   BASOSABS 0.0 05/03/2016 1220   BASOSABS 0.1 04/08/2016 1225      Studies/Results: Ir Perc Cholecystostomy  Result Date: 05/04/2016 INDICATION: 83 year old female with acute cholecystitis EXAM: CHOLECYSTOSTOMY MEDICATIONS: Patient is receiving Cipro in the hospital; The antibiotic was administered within an appropriate time frame prior to the initiation of the procedure. ANESTHESIA/SEDATION: Moderate (conscious) sedation was employed during this procedure. A total of Versed 2 point mg and Fentanyl 50 mcg was administered intravenously. Moderate Sedation Time: 20 minutes. The patient's level of consciousness and vital signs were monitored continuously by radiology nursing throughout the procedure under my direct supervision. FLUOROSCOPY TIME:  Fluoroscopy Time: 1 minutes 18 seconds (7.4 mGy). COMPLICATIONS: None PROCEDURE: Informed written consent was obtained from the patient and the patient's family after a thorough discussion of the procedural risks, benefits and alternatives. All questions were addressed. Maximal Sterile Barrier Technique was utilized including caps, mask, sterile gowns, sterile gloves, sterile drape, hand hygiene and skin antiseptic. A timeout was performed prior to the initiation of the procedure. Ultrasound survey of the right upper quadrant was performed for planning purposes. Once the patient is prepped and draped in the usual sterile fashion, the  skin and subcutaneous tissues overlying the gallbladder were generously infiltrated 1% lidocaine for local anesthesia. A coaxial needle was advanced under ultrasound guidance through the skin subcutaneous tissues and a small segment of liver into the gallbladder lumen. With removal of the stylet, spontaneous dark bile drainage occurred. Using modified Seldinger technique, a 10 French drain was placed into the gallbladder fossa, with  aspiration of the sample for the lab. Contrast injection confirmed position of the tube within the gallbladder lumen. Drainage catheter was attached to gravity drain with a suture retention placed. Patient tolerated the procedure well and remained hemodynamically stable throughout. No complications were encountered and no significant blood loss encountered. IMPRESSION: Status post percutaneous cholecystostomy. Signed, Dulcy Fanny. Earleen Newport, DO [facility] Radiology Electronically Signed   By: Corrie Mckusick D.O.   On: 05/04/2016 16:32    Anti-infectives: Anti-infectives    Start     Dose/Rate Route Frequency Ordered Stop   05/04/16 1200  ciprofloxacin (CIPRO) IVPB 400 mg     400 mg 200 mL/hr over 60 Minutes Intravenous Every 12 hours 05/04/16 1056       Assessment/Plan Stage Stage IV colon cancer with porta hepatis adenopathy- currently on Keytruda  Transaminates  Cholecystitis vs CBD obstruction- HIDA 1/9 showed no gallbladder filling - high risk sugical candidate given porta hepatis nodes and CBD dilation.    - 1/10 IR placed 41F cholecystostomy tube - GS gram positive cocci in pairs, Cx no growth 2 days; on cipro - LFTs trending down, no leukocytosis - advance diet as tolerated - If patient continues to improve and is discharged home, she will need follow up with [name] in 3-4 weeks to discusses cholecystectomy. Request that gastroenterology consider ERCP w/ stent placement to decompress the common bile duct prior to cholecystectomy.   - Management of her metastatic color per [name] with medical oncology   Plan: Advance diet. Stable for discharge from a surgical standpoint with appropriate PO abx and HH for drain care. Will require surgical follow up in 3-4 weeks. Son at bedside and daughter coming in to town today to assist the patient at home.   LOS: 3 days    Jill Alexanders , [facility] 05/06/2016, 9:42  AM Pager: [phone] Consults: [phone] Mon-Fri 7:00 am-4:30 pm Sat-Sun 7:00 am-11:30 am

## 2016-05-06 NOTE — Progress Notes (Signed)
Referring Physician(s): Gross,S  Supervising Physician: Aletta Edouard  Patient Status:  [facility] - In-pt  Chief Complaint:  cholecystitis  Subjective: Pt still with some RUQ soreness/splinting; currently without nausea/vomiting; tol diet   Allergies: Aspirin; Codeine; Morphine and related; and Penicillins  Medications: Prior to Admission medications   Medication Sig Start Date End Date Taking? Authorizing Provider  acetaminophen (TYLENOL) 500 MG tablet Take 1,000 mg by mouth every 6 (six) hours as needed for mild pain or moderate pain.    Yes Historical Provider, MD  HYDROmorphone (DILAUDID) 2 MG tablet Take 0.5-1 tablets (1-2 mg total) by mouth every 4 (four) hours as needed for severe pain. 04/29/16  Yes Truitt Merle, MD  lidocaine-prilocaine (EMLA) cream Apply 1 application topically as needed. Patient taking differently: Apply 1 application topically daily as needed (port access).  04/08/16  Yes Owens Shark, NP  ondansetron (ZOFRAN) 8 MG tablet Take 1 tablet (8 mg total) by mouth every 8 (eight) hours as needed for nausea or vomiting. 04/29/16  Yes Truitt Merle, MD  Polyethyl Glycol-Propyl Glycol (SYSTANE OP) Apply 1-2 drops to eye 4 (four) times daily as needed (for dry eye).   Yes Historical Provider, MD  traMADol (ULTRAM) 50 MG tablet Take 1 tablet (50 mg total) by mouth every 8 (eight) hours as needed. Patient taking differently: Take 50 mg by mouth every 8 (eight) hours as needed for moderate pain or severe pain.  04/08/16  Yes Owens Shark, NP  ondansetron (ZOFRAN ODT) 4 MG disintegrating tablet Take 1 tablet (4 mg total) by mouth every 8 (eight) hours as needed for nausea or vomiting. Patient not taking: Reported on 05/03/2016 04/25/16   Carlisle Cater, PA-C     Vital Signs: BP (!) 98/45 (BP Location: Left Arm)   Pulse 65   Temp 98.6 F (37 C) (Oral)   Resp 16   Ht 5\' 4"  (1.626 m)   Wt 122 lb (55.3 kg)   SpO2 95%   BMI 20.94 kg/m   Physical Exam GB drain intact,  insertion site ok, output 325 cc green bile; cx's pend  Imaging: Nm Hepatobiliary Liver Func  Result Date: 05/04/2016 CLINICAL DATA:  Abnormal LFTs. Chronic worsening right upper quadrant abdominal pain. Initial encounter. EXAM: NUCLEAR MEDICINE HEPATOBILIARY IMAGING TECHNIQUE: Sequential images of the abdomen were obtained out to 60 minutes following intravenous administration of radiopharmaceutical. The gallbladder was not visualized. As the patient has an allergy to morphine, slow intravenous infusion of 1.1 mcg of cholecystokinin was performed. RADIOPHARMACEUTICALS:  5.05 mCi Tc-64m Choletec IV COMPARISON:  None. FINDINGS: Prompt uptake and biliary excretion of activity by the liver is seen. Biliary activity passes into small bowel, consistent with patent common bile duct. The gallbladder is not visualized at 45 minutes after administration of radiopharmaceutical. As the patient has an allergy to morphine, slow intravenous infusion of cholecystokinin was performed. The gallbladder was not visualized at 1 hour following initiation of infusion. This is most compatible with acute cholecystitis, though in some cases, chronic cholecystitis can have a similar appearance. IMPRESSION: Prompt uptake and biliary excretion of activity by the liver. Gallbladder not visualized, even after infusion of cholecystokinin. This is most compatible with acute cholecystitis, though in some cases, chronic cholecystitis can have a similar appearance. Electronically Signed   By: Garald Balding M.D.   On: 05/04/2016 02:38   US Abdomen Complete  Result Date: 05/03/2016 CLINICAL DATA:  Abdominal pain EXAM: ABDOMEN ULTRASOUND COMPLETE COMPARISON:  04/25/2016 FINDINGS: Gallbladder: Multiple gallstones  are again identified stable from the prior CT examination. No gallbladder wall thickening or pericholecystic fluid is noted. Common bile duct: Diameter: Dilated at 11 mm. This is similar to that seen on prior CT examination. Liver: No  focal lesion identified. Within normal limits in parenchymal echogenicity. IVC: No abnormality visualized. Pancreas: Visualized portion unremarkable. Spleen: Size and appearance within normal limits. Right Kidney: Length: 9.4 cm. Echogenicity within normal limits. No mass or hydronephrosis visualized. Left Kidney: Length: 9.3 cm. Echogenicity within normal limits. No mass or hydronephrosis visualized. Abdominal aorta: No aneurysm visualized. Other findings: None. IMPRESSION: Cholelithiasis. Common bile duct dilatation stable from the previous exam. Correlation with bilirubin level is recommended. Electronically Signed   By: Inez Catalina M.D.   On: 05/03/2016 15:06   Mr 3d Recon At Scanner  Result Date: 05/03/2016 CLINICAL DATA:  83 year old female with history of colon cancer. Two year history of intermittent right upper quadrant abdominal pain. EXAM: MRI ABDOMEN WITHOUT AND WITH CONTRAST (INCLUDING MRCP) TECHNIQUE: Multiplanar multisequence MR imaging of the abdomen was performed both before and after the administration of intravenous contrast. Heavily T2-weighted images of the biliary and pancreatic ducts were obtained, and three-dimensional MRCP images were rendered by post processing. CONTRAST:  52mL MULTIHANCE GADOBENATE DIMEGLUMINE 529 MG/ML IV SOLN COMPARISON:  CT scan 04/25/2016 and abdominal ultrasound 05/03/2016. FINDINGS: Lower chest: The lung bases demonstrate chronic basilar scarring and emphysema. No effusions or pulmonary lesions. No pericardial effusion. Hepatobiliary: No focal hepatic lesions are identified. Geographic fatty infiltration is noted. There is stable intra and extrahepatic biliary dilatation. The gallbladder is filled with gallstones. The common bile duct is dilated to a maximum of 13.5 mm in the porta hepatis and 11.5 mm in the head of the pancreas. This is a stable finding. No common bile duct stones are identified. Pancreas:  No mass, inflammation or ductal dilatation. Spleen:   Normal size.  No focal lesions. Adrenals/Urinary Tract: The adrenal glands and kidneys are unremarkable. Lower pole right renal cyst is noted. Stomach/Bowel: The stomach, duodenum, visualized small bowel and visualize colon are grossly normal. Vascular/Lymphatic: Stable atherosclerotic calcifications involving the aorta and branch vessels but no aneurysm or dissection. Small scattered mesenteric and retroperitoneal lymph nodes but no mass or overt adenopathy. Other:  No ascites or abdominal wall hernia. Musculoskeletal: No significant bony findings. IMPRESSION: 1. Cholelithiasis but no findings suspicious for acute cholecystitis and no common bile duct stones. Chronic intra and extrahepatic biliary dilatation. 2. Normal MR appearance of the pancreas. Normal caliber and course of the main pancreatic duct. Electronically Signed   By: Marijo Sanes M.D.   On: 05/03/2016 21:18   Ir Perc Cholecystostomy  Result Date: 05/04/2016 INDICATION: 83 year old female with acute cholecystitis EXAM: CHOLECYSTOSTOMY MEDICATIONS: Patient is receiving Cipro in the hospital; The antibiotic was administered within an appropriate time frame prior to the initiation of the procedure. ANESTHESIA/SEDATION: Moderate (conscious) sedation was employed during this procedure. A total of Versed 2 point mg and Fentanyl 50 mcg was administered intravenously. Moderate Sedation Time: 20 minutes. The patient's level of consciousness and vital signs were monitored continuously by radiology nursing throughout the procedure under my direct supervision. FLUOROSCOPY TIME:  Fluoroscopy Time: 1 minutes 18 seconds (7.4 mGy). COMPLICATIONS: None PROCEDURE: Informed written consent was obtained from the patient and the patient's family after a thorough discussion of the procedural risks, benefits and alternatives. All questions were addressed. Maximal Sterile Barrier Technique was utilized including caps, mask, sterile gowns, sterile gloves, sterile drape,  hand hygiene and  skin antiseptic. A timeout was performed prior to the initiation of the procedure. Ultrasound survey of the right upper quadrant was performed for planning purposes. Once the patient is prepped and draped in the usual sterile fashion, the skin and subcutaneous tissues overlying the gallbladder were generously infiltrated 1% lidocaine for local anesthesia. A coaxial needle was advanced under ultrasound guidance through the skin subcutaneous tissues and a small segment of liver into the gallbladder lumen. With removal of the stylet, spontaneous dark bile drainage occurred. Using modified Seldinger technique, a 10 French drain was placed into the gallbladder fossa, with aspiration of the sample for the lab. Contrast injection confirmed position of the tube within the gallbladder lumen. Drainage catheter was attached to gravity drain with a suture retention placed. Patient tolerated the procedure well and remained hemodynamically stable throughout. No complications were encountered and no significant blood loss encountered. IMPRESSION: Status post percutaneous cholecystostomy. Signed, Dulcy Fanny. Earleen Newport, DO [facility] Radiology Electronically Signed   By: Corrie Mckusick D.O.   On: 05/04/2016 16:32   Mr Abdomen Mrcp Moise Boring Contast  Result Date: 05/03/2016 CLINICAL DATA:  83 year old female with history of colon cancer. Two year history of intermittent right upper quadrant abdominal pain. EXAM: MRI ABDOMEN WITHOUT AND WITH CONTRAST (INCLUDING MRCP) TECHNIQUE: Multiplanar multisequence MR imaging of the abdomen was performed both before and after the administration of intravenous contrast. Heavily T2-weighted images of the biliary and pancreatic ducts were obtained, and three-dimensional MRCP images were rendered by post processing. CONTRAST:  38mL MULTIHANCE GADOBENATE DIMEGLUMINE 529 MG/ML IV SOLN COMPARISON:  CT scan 04/25/2016 and abdominal ultrasound  05/03/2016. FINDINGS: Lower chest: The lung bases demonstrate chronic basilar scarring and emphysema. No effusions or pulmonary lesions. No pericardial effusion. Hepatobiliary: No focal hepatic lesions are identified. Geographic fatty infiltration is noted. There is stable intra and extrahepatic biliary dilatation. The gallbladder is filled with gallstones. The common bile duct is dilated to a maximum of 13.5 mm in the porta hepatis and 11.5 mm in the head of the pancreas. This is a stable finding. No common bile duct stones are identified. Pancreas:  No mass, inflammation or ductal dilatation. Spleen:  Normal size.  No focal lesions. Adrenals/Urinary Tract: The adrenal glands and kidneys are unremarkable. Lower pole right renal cyst is noted. Stomach/Bowel: The stomach, duodenum, visualized small bowel and visualize colon are grossly normal. Vascular/Lymphatic: Stable atherosclerotic calcifications involving the aorta and branch vessels but no aneurysm or dissection. Small scattered mesenteric and retroperitoneal lymph nodes but no mass or overt adenopathy. Other:  No ascites or abdominal wall hernia. Musculoskeletal: No significant bony findings. IMPRESSION: 1. Cholelithiasis but no findings suspicious for acute cholecystitis and no common bile duct stones. Chronic intra and extrahepatic biliary dilatation. 2. Normal MR appearance of the pancreas. Normal caliber and course of the main pancreatic duct. Electronically Signed   By: Marijo Sanes M.D.   On: 05/03/2016 21:18    Labs:  CBC:  Recent Labs  05/01/16 0255 05/03/16 1220 05/04/16 1013 05/05/16 0502  WBC 5.9 8.2 4.2 5.1  HGB 10.8* 11.2* 9.2* 9.8*  HCT 33.3* 34.9* 29.4* 30.6*  PLT 292 284 185 121*    COAGS:  Recent Labs  02/12/16 1135 05/04/16 1013  INR 0.94 1.06    BMP:  Recent Labs  05/01/16 0255 05/03/16 1220 05/04/16 1013 05/05/16 0502  NA 136 136 138 138  K 4.3 4.3 3.8 3.8  CL 104 102 107 108  CO2 23 27 26  25    GLUCOSE 109* 109* 102* 112*  BUN 18 21* 16 12  CALCIUM 8.3* 8.6* 7.9* 8.1*  CREATININE 0.68 0.95 0.83 0.87  GFRNONAA >60 51* >60 57*  GFRAA >60 59* >60 >60    LIVER FUNCTION TESTS:  Recent Labs  05/01/16 0255 05/03/16 1220 05/04/16 1013 05/05/16 0502  BILITOT 0.3 0.8 0.6 0.8  AST 26 431* 237* 99*  ALT 43 143* 254* 172*  ALKPHOS 314* 550* 462* 377*  PROT 6.3* 6.5 5.8* 5.9*  ALBUMIN 3.1* 3.3* 2.9* 2.8*    Assessment and Plan: Cholecystitis, s/p perc cholecystostomy 1/10; AF; WBC nl; cont drain irrigation- once daily as OP and record output; check final bile cx's ;current drain will need to remain in place for at least 4-6 weeks to allow for tract maturity before removing unless cholecystectomy done in the interim. If problems encountered with drain can set up for injection study prn.   Electronically Signed: D. Rowe Robert 05/06/2016, 1:56 PM   I spent a total of 20 minutes at the the patient's bedside AND on the patient's hospital floor or unit, greater than 50% of which was counseling/coordinating care for gallbladder drain    Patient ID: Joann Herrera, female   DOB: 19-Jul-1923, 83 y.o.   MRN: [id number]

## 2016-05-07 DIAGNOSIS — K838 Other specified diseases of biliary tract: Secondary | ICD-10-CM

## 2016-05-07 DIAGNOSIS — K8065 Calculus of gallbladder and bile duct with chronic cholecystitis with obstruction: Secondary | ICD-10-CM

## 2016-05-07 DIAGNOSIS — R7989 Other specified abnormal findings of blood chemistry: Secondary | ICD-10-CM

## 2016-05-07 DIAGNOSIS — R945 Abnormal results of liver function studies: Secondary | ICD-10-CM

## 2016-05-07 LAB — COMPREHENSIVE METABOLIC PANEL
ALK PHOS: 257 U/L — AB (ref 38–126)
ALT: 60 U/L — AB (ref 14–54)
AST: 18 U/L (ref 15–41)
Albumin: 2.5 g/dL — ABNORMAL LOW (ref 3.5–5.0)
Anion gap: 6 (ref 5–15)
BUN: 7 mg/dL (ref 6–20)
CALCIUM: 7.8 mg/dL — AB (ref 8.9–10.3)
CHLORIDE: 106 mmol/L (ref 101–111)
CO2: 24 mmol/L (ref 22–32)
CREATININE: 0.83 mg/dL (ref 0.44–1.00)
GFR calc non Af Amer: >60 mL/min (ref >60)
Glucose, Bld: 123 mg/dL — ABNORMAL HIGH (ref 65–99)
Potassium: 3.7 mmol/L (ref 3.5–5.1)
SODIUM: 136 mmol/L (ref 135–145)
Total Bilirubin: 0.4 mg/dL (ref 0.3–1.2)
Total Protein: 5.1 g/dL — ABNORMAL LOW (ref 6.5–8.1)

## 2016-05-07 MED ORDER — POLYETHYLENE GLYCOL 3350 17 G PO PACK
17.0000 g | PACK | Freq: Every day | ORAL | Status: DC
  Administered 2016-05-07 – 2016-05-08 (×2): 17 g via ORAL
  Filled 2016-05-07 (×2): qty 1

## 2016-05-07 NOTE — Progress Notes (Signed)
Daily Progress Note   Patient Name: Joann Herrera       Date: 05/07/2016 DOB: [birth year]  Age: 83 y.o. MRN#: [id number] Attending Physician: Allie Bossier, MD Primary Care Physician: [name], MD Admit Date: 05/03/2016  Reason for Consultation/Follow-up: Establishing goals of care  Subjective:  patient is awake alert resting in bed She did not rest much overnight Abdominal pain is some what better controlled, she is tolerating regular diet denies nausea has not had a BM  Length of Stay: 4  Current Medications: Scheduled Meds:  . ciprofloxacin  500 mg Oral BID  . heparin  5,000 Units Subcutaneous Q8H  . polyethylene glycol  17 g Oral Daily    Continuous Infusions: . dextrose 5 % and 0.9% NaCl 100 mL/hr at 05/07/16 0827    PRN Meds: HYDROmorphone, ondansetron, oxyCODONE-acetaminophen, sodium chloride flush  Physical Exam         NAD S1 S2 Clear Abdomen RUQ tenderness+ has drain No edema Non focal  Vital Signs: BP (!) 123/50 (BP Location: Left Arm)   Pulse 66   Temp 97.6 F (36.4 C) (Oral)   Resp 17   Ht 5\' 4"  (1.626 m)   Wt 55.3 kg (122 lb)   SpO2 95%   BMI 20.94 kg/m  SpO2: SpO2: 95 % O2 Device: O2 Device: Not Delivered O2 Flow Rate: O2 Flow Rate (L/min): 2 L/min  Intake/output summary:  Intake/Output Summary (Last 24 hours) at 05/07/16 0954 Last data filed at 05/07/16 0900  Gross per 24 hour  Intake             1735 ml  Output              800 ml  Net              935 ml   LBM: Last BM Date: 05/04/16 (pt reports constipation, md notified) Baseline Weight: Weight: 55.3 kg (122 lb) Most recent weight: Weight: 55.3 kg (122 lb)       Palliative Assessment/Data:    Flowsheet Rows   Flowsheet Row Most Recent Value  Intake Tab  Referral  Department  Hospitalist  Unit at Time of Referral  Oncology Unit  Palliative Care Primary Diagnosis  Cancer  Date Notified  05/04/16  Palliative Care Type  Return patient Palliative  Care  Reason for referral  Clarify Goals of Care  Date of Admission  05/03/16  Date first seen by Palliative Care  05/06/16  # of days Palliative referral response time  2 Day(s)  # of days IP prior to Palliative referral  1  Clinical Assessment  Palliative Performance Scale Score  30%  Psychosocial & Spiritual Assessment  Palliative Care Outcomes      Patient Active Problem List   Diagnosis Date Noted  . Chronic cholecystitis   . Adenocarcinoma, colon ([place])   . Right sided abdominal pain   . Encounter for palliative care   . Goals of care, counseling/discussion   . Cholecystitis, acute   . Abnormal LFTs   . Calculus of gallbladder and bile duct with cholecystitis with obstruction   . Cholecystitis, chronic   . Abdominal pain 05/03/2016  . Dehydration 09/08/2015  . Constipation 09/08/2015  . Atypical chest pain   . Metastatic colon cancer in female [facility])   . Cancer of left colon ([place]) 05/18/2015  . Abdominal aortic aneurysm ([place]) 04/21/2015  . Cholelithiasis 03/17/2015  . Dilated cbd, acquired 03/17/2015  . Gastritis 02/25/2015  . Aortic arch atherosclerosis ([place]) 02/25/2015  . Numbness 02/25/2015  . Nicotine dependence 02/25/2015  . Abdominal pain, epigastric 01/06/2015  . Hypertriglyceridemia 12/11/2014  . Vertigo 11/16/2014  . CVA (cerebral infarction) 11/16/2014  . Arthritis 11/16/2014  . Pre-syncope 11/16/2014  . Hypocalcemia 11/16/2014  . Squamous cell carcinoma 07/30/2014  . Insomnia 03/17/2014  . Fatigue 09/13/2013  . Depression 09/13/2013  . GERD (gastroesophageal reflux disease) 09/02/2013  . Unsteady gait 09/02/2013  . Dry eye 06/27/2013  . 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 Horseshoe tear of retina without detachment 03/31/2011  . Age-related macular  degeneration, dry 03/31/2011    Palliative Care Assessment & Plan   Patient Profile:    Assessment:  1. Cholelithiasis with chronic cholecystitis 2. Metastatic colon cancer to peritoneum 3. Transaminitis 4. Abdominal pain  5. Moderate Malnutrition   Recommendations/Plan: Discussed in detail with patient:  Patient recalls her discussions with Dr Burr Medico from oncology late yesterday evening. Patient states that her Beryle Flock is on hold, not d/c indefinitely. She states she will follow up with Dr Burr Medico and will get a PET scan soon. Patient is eager to continue her drain, she also wants GB surgery.  Patient states she wants to go home towards the end of this hospitalization, she does not want SNF rehab. She does not want Hospice. A detailed discussion regarding hospice philosophy being one of comfort at end of life discussed, patient does not want home hospice, does not ever want to go to a hospice home.   Late yesterday evening, I received a call from [facility] [place] RN liaison who worked with Billey Chang NP who visited with the patient in February 2017 at her home. At that time, patient had reportedly consented to DNR DNI. Palliative services followed up via phone calls in April and October 2017, after which, the patient told them she did not want ongoing palliative follow up.    Code status discussions undertaken, patient simply states she does not recall meeting with palliative liaison at her home, she wishes to continue with full code. She states she will discuss further with her children later today.   At this time, patient's goals are not palliative in nature, certainly not of hospice philosophy.   Code Status:    Code Status Orders  Start     Ordered   05/03/16 2105  Full code  Continuous     05/03/16 2104    Code Status History    Date Active Date Inactive Code Status Order ID Comments User Context   11/16/2014  6:28 AM 11/17/2014  9:50 PM  Full Code [id number]  Theressa Millard, MD Inpatient   05/03/2012  2:20 PM 05/07/2012  8:43 PM Full Code [id number]  Jeralene Huff Inpatient       Prognosis:   guarded, given metastatic colon cancer to peritoneum and chronic cholecystitis.   Discharge Planning:  Home with [place] was discussed with patient.   Thank you for allowing the Palliative Medicine Team to assist in the care of this patient.   Time In: 8 Time Out: 8.35 Total Time 35 Prolonged Time Billed  no       Greater than 50%  of this time was spent counseling and coordinating care related to the above assessment and plan.  Loistine Chance, MD [phone]  Please contact Palliative Medicine Team phone at [phone] for questions and concerns.

## 2016-05-07 NOTE — Progress Notes (Signed)
PROGRESS NOTE    Joann Herrera  KZS:[id number] DOB: February 15, 1924 DOA: 05/03/2016 PCP: [name], MD     Brief Narrative:  83 y.o. WF PMHx Colon CA with suspected Metastases, squamous cell Cancer  Presenting with abdominal pain. She reports about 2 years of waxing/waning RUQ abdominal pain that is severe, aching, radiating to the back, sometimes worse with food. Has had nausea and vomiting in the past, but not today. Pain better with tylenol and po dilaudid. Has been eating less today and diffusely weak. She had been seen 4 times in the past 2 weeks for similar complaints.    Subjective: 1/13 A/O 4, appropriate pain to right lateral thoracic lateral Cavity consistent with placement of drain. Patient pain is decreasing.      Assessment & Plan:   Principal Problem:   Abdominal pain Active Problems:   GERD (gastroesophageal reflux disease)   Cholelithiasis   Dilated cbd, acquired   Cancer of left colon (HCC)   Dehydration   Constipation   Cholecystitis, acute   Abnormal LFTs   Calculus of gallbladder and bile duct with cholecystitis with obstruction   Cholecystitis, chronic   Chronic cholecystitis   Adenocarcinoma, colon (HCC)   Right sided abdominal pain   Encounter for palliative care   Goals of care, counseling/discussion   Chronic calculus cholecystitis -This maybe in part the reason for her chronic abdominal pain -05/04/16--HIDA--no GB filling suggesting acute cholecystitis -05/03/16--MRCP--cholelithiasis with choledocholithiasis, chronic intra-and extra biliary dilatation -05/04/16--perc cholecystotomy tube placed as pt is high risk surgical candidate. Will need to remain 6-8 weeks. Daughter to arrive from [place] today, along with a son who will help with care of patient. Will conduct class on care of cholecystectomy tube. Possible discharge 1/14 -continue Cipro. Patient to follow-up with [name] in 3-4 weeks to discusses cholecystectomy. 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 on reduced-dose Beryle Flock. CT 01/29/16 showed stable disease. Recent plan was to schedule PET scan.  -Per [name] oncology If her chronic cholecystitis does not resolve, I will not continue Keytruda. I do not think she is a candidate for other anticancer treatment options such as chemo, due to her advanced age, comorbidities and poor performance status. -I again reviewed the option of palliative care and hospice. If her chronic cholecystitis cannot be managed by surgery or stenting, I think she will likely have persistent abdominal pain, and it will be very difficult for her to come to clinic for follow-up and treatment. I again explained the role and the benefit of hospice, and encouraged her to reconsider. She previously declined hospice. She has met palliative care team in hospital, still wants to be full code and continue anticancer treatment. f/u appointment in 2-3 weeks  Dehydration -continue IVF  FTT -PT eval -nutrition consult  Constipation -Patient had a bowel movement this morning -needs regular bowel regimen once tolerating po   DVT prophylaxis: Subcutaneous heparin Code Status: Full Family Communication: Son at bedside understands plan of care. Agrees with discharge on 1/14 after his brother and sister arrived and are comfortable with care of cholecystostomy tube Disposition Plan: Patient requested a home with hospice. Awaiting PALLIATIVE CARE recommendations.   Consultants:  [facility] Dr. Loistine Chance. Palliative Medicine  [name] oncology  Dr.Glenn Kathlene Cote IR    Procedures/Significant Events:  1/10 S/P cholecystostomy  drain will need to remain in place for  at least 4-6 weeks to allow for tract maturity before removing    VENTILATOR SETTINGS:    Cultures 1/10 gallbladder  NGTD     Antimicrobials: Anti-infectives    Start     Stop   05/06/16 1300  ciprofloxacin (CIPRO) tablet 500 mg         05/04/16 1200  ciprofloxacin (CIPRO) IVPB 400 mg  Status:  Discontinued     05/06/16 1216       Devices   LINES / TUBES:      Continuous Infusions: . dextrose 5 % and 0.9% NaCl 100 mL/hr at 05/07/16 0827     Objective: Vitals:   05/06/16 0559 05/06/16 1414 05/06/16 2203 05/07/16 0513  BP: (!) 98/45 (!) 97/45 (!) 124/51 (!) 123/50  Pulse: 65 63 64 66  Resp: '16 16 16 17  '$ Temp: 98.6 F (37 C) 98.7 F (37.1 C) 98.5 F (36.9 C) 97.6 F (36.4 C)  TempSrc: Oral Oral Oral Oral  SpO2: 95% 100% 98% 95%  Weight:      Height:        Intake/Output Summary (Last 24 hours) at 05/07/16 1257 Last data filed at 05/07/16 1158  Gross per 24 hour  Intake             1735 ml  Output              840 ml  Net              895 ml   Filed Weights   05/03/16 1022  Weight: 55.3 kg (122 lb)    Examination:  General: A/O 4, cachectic, appropriately tender for recent drain placement, No acute respiratory distress Eyes: negative scleral hemorrhage, negative anisocoria, negative icterus ENT: Negative Runny nose, negative gingival bleeding, Neck:  Negative scars, masses, torticollis, lymphadenopathy, JVD Lungs: Clear to auscultation bilaterally without wheezes or crackles Cardiovascular: Regular rate and rhythm without murmur gallop or rub normal S1 and S2 Abdomen: negative abdominal pain, nondistended, positive soft, bowel sounds, no rebound, no ascites, no appreciable mass, cholecystectomy drain in place right chest wall Extremities:: No significant cyanosis, clubbing, or edema bilateral lower extremities Skin: Negative rashes, lesions, ulcers Psychiatric:  Negative depression, negative anxiety, negative fatigue, negative mania  Central nervous system:  Cranial nerves II through XII intact, tongue/uvula midline, all extremities muscle strength 5/5, sensation  intact throughout,negative dysarthria, negative expressive aphasia, negative receptive aphasia.  .     Data Reviewed: Care during the described time interval was provided by me .  I have reviewed this patient's available data, including medical history, events of note, physical examination, and all test results as part of my evaluation. I have personally reviewed and interpreted all radiology studies.  CBC:  Recent Labs Lab 05/01/16 0255 05/03/16 1220 05/04/16 1013 05/05/16 0502  WBC 5.9 8.2 4.2 5.1  NEUTROABS 3.2 6.0  --   --   HGB 10.8* 11.2* 9.2* 9.8*  HCT 33.3* 34.9* 29.4* 30.6*  MCV 88.1 86.4 86.7 88.7  PLT 292 284 185 644*   Basic Metabolic Panel:  Recent Labs Lab 05/01/16 0255 05/03/16 1220 05/04/16 1013 05/05/16 0502  NA 136 136 138 138  K 4.3 4.3 3.8 3.8  CL 104 102 107 108  CO2 '23 27 26 25  '$ GLUCOSE 109* 109* 102* 112*  BUN 18 21* 16 12  CREATININE 0.68 0.95 0.83 0.87  CALCIUM 8.3* 8.6* 7.9* 8.1*   GFR: Estimated Creatinine Clearance: 37.1 mL/min (by C-G formula based on SCr  of 0.87 mg/dL). Liver Function Tests:  Recent Labs Lab 05/01/16 0255 05/03/16 1220 05/04/16 1013 05/05/16 0502  AST 26 431* 237* 99*  ALT 43 143* 254* 172*  ALKPHOS 314* 550* 462* 377*  BILITOT 0.3 0.8 0.6 0.8  PROT 6.3* 6.5 5.8* 5.9*  ALBUMIN 3.1* 3.3* 2.9* 2.8*    Recent Labs Lab 05/01/16 0255 05/03/16 1220  LIPASE 83* 83*   No results for input(s): AMMONIA in the last 168 hours. Coagulation Profile:  Recent Labs Lab 05/04/16 1013  INR 1.06   Cardiac Enzymes: No results for input(s): CKTOTAL, CKMB, CKMBINDEX, TROPONINI in the last 168 hours. BNP (last 3 results) No results for input(s): PROBNP in the last 8760 hours. HbA1C: No results for input(s): HGBA1C in the last 72 hours. CBG: No results for input(s): GLUCAP in the last 168 hours. Lipid Profile: No results for input(s): CHOL, HDL, LDLCALC, TRIG, CHOLHDL, LDLDIRECT in the last 72 hours. Thyroid  Function Tests: No results for input(s): TSH, T4TOTAL, FREET4, T3FREE, THYROIDAB in the last 72 hours. Anemia Panel: No results for input(s): VITAMINB12, FOLATE, FERRITIN, TIBC, IRON, RETICCTPCT in the last 72 hours. Sepsis Labs: No results for input(s): PROCALCITON, LATICACIDVEN in the last 168 hours.  Recent Results (from the past 240 hour(s))  Aerobic/Anaerobic Culture (surgical/deep wound)     Status: None (Preliminary result)   Collection Time: 05/04/16  4:21 PM  Result Value Ref Range Status   Specimen Description GALL BLADDER BILE  Final   Special Requests NONE  Final   Gram Stain   Final    ABUNDANT WBC PRESENT, PREDOMINANTLY PMN RARE GRAM POSITIVE COCCI IN PAIRS    Culture   Final    NO GROWTH 2 DAYS NO ANAEROBES ISOLATED; CULTURE IN PROGRESS FOR 5 DAYS Performed at [facility]    Report Status PENDING  Incomplete         Radiology Studies: No results found.      Scheduled Meds: . ciprofloxacin  500 mg Oral BID  . heparin  5,000 Units Subcutaneous Q8H  . polyethylene glycol  17 g Oral Daily   Continuous Infusions: . dextrose 5 % and 0.9% NaCl 100 mL/hr at 05/07/16 0827     LOS: 4 days    Time spent:40 min    Nathon Stefanski, Geraldo Docker, MD [facility] Pager [phone]  If 7PM-7AM, please contact night-coverage [email or web address] Password [facility] 05/07/2016, 12:57 PM

## 2016-05-07 NOTE — Progress Notes (Signed)
[facility] Progress Note     Subjective: Having some pain at drain site  Objective: Vital signs in last 24 hours: Temp:  [97.6 F (36.4 C)-98.7 F (37.1 C)] 97.6 F (36.4 C) (01/13 0513) Pulse Rate:  [63-66] 66 (01/13 0513) Resp:  [16-17] 17 (01/13 0513) BP: (97-124)/(45-51) 123/50 (01/13 0513) SpO2:  [95 %-100 %] 95 % (01/13 0513) Last BM Date: 05/04/16 (pt reports constipation, md notified)  Intake/Output from previous day: 01/12 0701 - 01/13 0700 In: 1605 [I.V.:1600] Out: 600 [Urine:350; Drains:250] Intake/Output this shift: Total I/O In: 130 [P.O.:120; Other:10] Out: 200 [Urine:200]  PE: Gen:  Alert, NAD, pleasant and cooperative Abd: Soft, TTP RUQ              Drain 250 cc/24h, bilious   Ext:  No erythema, edema, or tenderness  Lab Results:  Hepatic Function Latest Ref Rng & Units 05/05/2016 05/04/2016 05/03/2016  Total Protein 6.5 - 8.1 g/dL 5.9(L) 5.8(L) 6.5  Albumin 3.5 - 5.0 g/dL 2.8(L) 2.9(L) 3.3(L)  AST 15 - 41 U/L 99(H) 237(H) 431(H)  ALT 14 - 54 U/L 172(H) 254(H) 143(H)  Alk Phosphatase 38 - 126 U/L 377(H) 462(H) 550(H)  Total Bilirubin 0.3 - 1.2 mg/dL 0.8 0.6 0.8  Bilirubin, Direct 0.0 - 0.3 mg/dL - - -   CBC    Component Value Date/Time   WBC 5.1 05/05/2016 0502   RBC 3.45 (L) 05/05/2016 0502   HGB 9.8 (L) 05/05/2016 0502   HGB 11.4 (L) 04/08/2016 1225   HCT 30.6 (L) 05/05/2016 0502   HCT 35.4 04/08/2016 1225   PLT 121 (L) 05/05/2016 0502   PLT 239 04/08/2016 1225   MCV 88.7 05/05/2016 0502   MCV 87.4 04/08/2016 1225   MCH 28.4 05/05/2016 0502   MCHC 32.0 05/05/2016 0502   RDW 14.0 05/05/2016 0502   RDW 14.7 (H) 04/08/2016 1225   LYMPHSABS 1.2 05/03/2016 1220   LYMPHSABS 1.2 04/08/2016 1225   MONOABS 0.7 05/03/2016 1220   MONOABS 0.4 04/08/2016 1225   EOSABS 0.2 05/03/2016 1220   EOSABS 0.3 04/08/2016 1225   BASOSABS 0.0 05/03/2016 1220   BASOSABS 0.1 04/08/2016 1225      Studies/Results: No results  found.  Anti-infectives: Anti-infectives    Start     Dose/Rate Route Frequency Ordered Stop   05/06/16 1300  ciprofloxacin (CIPRO) tablet 500 mg     500 mg Oral 2 times daily 05/06/16 1216     05/04/16 1200  ciprofloxacin (CIPRO) IVPB 400 mg  Status:  Discontinued     400 mg 200 mL/hr over 60 Minutes Intravenous Every 12 hours 05/04/16 1056 05/06/16 1216     Assessment/Plan Stage Stage IV colon cancer with porta hepatis adenopathy- currently on Keytruda (held)  Transaminates  Cholecystitis vs CBD obstruction- HIDA 1/9 showed no gallbladder filling - high risk sugical candidate given porta hepatis nodes and CBD dilation.    - 1/10 IR placed 6F cholecystostomy tube - GS gram positive cocci in pairs, Cx no growth x2 days; on cipro - LFTs trending down, no leukocytosis - advance diet as tolerated - If patient continues to improve and is discharged home, she will need follow up with [name] in 3-4 weeks to discusses cholecystectomy. Request that gastroenterology consider ERCP w/ stent placement to decompress the common bile duct prior to cholecystectomy.   - Management of her metastatic color per [name] with medical oncology   Plan: Advance diet. Stable for discharge from a surgical standpoint with  appropriate PO abx and HH for drain care. Will require surgical follow up in 3-4 weeks.   LOS: 4 days    Rosario Adie, MD  [facility]

## 2016-05-08 DIAGNOSIS — R590 Localized enlarged lymph nodes: Secondary | ICD-10-CM

## 2016-05-08 MED ORDER — CIPROFLOXACIN HCL 500 MG PO TABS: 500.0000 mg | ORAL_TABLET | Freq: Two times a day (BID) | ORAL | 0 refills | Status: AC

## 2016-05-08 MED ORDER — HEPARIN SOD (PORK) LOCK FLUSH 100 UNIT/ML IV SOLN
500.0000 [IU] | INTRAVENOUS | Status: AC | PRN
  Administered 2016-05-08: 500 [IU]

## 2016-05-08 MED ORDER — OXYCODONE-ACETAMINOPHEN 5-325 MG PO TABS: 1.0000 | ORAL_TABLET | ORAL | 0 refills | Status: DC | PRN

## 2016-05-08 NOTE — Progress Notes (Signed)
Patient discharged home.  Dressing changed per physician verbal order prior to discharge.  Patient leaving with personal belongings, prescriptions, and Home Health.  Daughter educated and reports understanding of how to flush and care for biliary drain for patient at discharge.  Patient and daughter report understanding of discharge instructions.  Room air, no s/s of distress.  Patient accompanied by daughter at discharge.  No complaints.

## 2016-05-08 NOTE — Progress Notes (Signed)
[facility]  [address] [place], [place] [address] Phone: [phone] FAX: [phone]   Joann Herrera [id number] 11-Aug-1923    Problem List:   Principal Problem:   Acute cholecystitis s/p perc drainage 05/04/2016 Active Problems:   GERD (gastroesophageal reflux disease)   Cholelithiasis   Dilated cbd, acquired   Cancer of left colon ([place])   Dehydration   Constipation   Abdominal pain   Calculus of gallbladder and bile duct with cholecystitis with obstruction   Right sided abdominal pain   Encounter for palliative care   Goals of care, counseling/discussion           Assessment  Stage Stage IV colon cancer with porta hepatis adenopathy- currently on Keytruda (held)  Transaminates  Cholecystitis vs CBD obstruction- HIDA 1/9 showed no gallbladder filling AST and ALT improving- 18 and 60 (AST down from 99 ALT down from 172) No leukocytosis   Plan:  - Cleared for discharge from a surgical standpoint -HH for drain care -Surgical f/u in 3-4 weeks for possible ERCP/ stint  _0 @  05/08/2016  CARE TEAM:  PCP: [name], MD  Outpatient Care Team: Patient Care Team: [name], MD as PCP - General (Internal Medicine) Truitt Merle, MD as Consulting Physician (Hematology) Erroll Luna, MD as Consulting Physician (General Surgery) Milus Banister, MD as Attending Physician (Gastroenterology)  Inpatient Treatment Team: Treatment Team: Attending Provider: Allie Bossier, MD; Rounding Team: Nolon Nations, MD; Consulting Physician: Truitt Merle, MD; Rounding Team: Suzan Garibaldi, MD; Technician: Wilder Glade, NT; Registered Nurse: Scarlett Presto Pinnix, RN; Registered Nurse: Martyn Malay, RN; Technician: Raylene Everts, NT; Technician: Virgia Land, NT; Registered Nurse: Dala Dock, RN; Technician: Willy Eddy, NT; Registered Nurse: Sharma Covert, RN; Physical Therapist: Galen Manila,  PT  Subjective:  Patient reports no new symptoms, and is tolerating a full diet without increased pain.  Denies, nausea, vomiting, reflux.  Having flatus.  Objective:  Drain in place on R upper abdomin, bilious drainage. Vital signs:  Vitals:   05/07/16 0513 05/07/16 1436 05/07/16 2108 05/08/16 0621  BP: (!) 123/50 (!) 109/45 (!) 120/55 (!) 123/51  Pulse: 66 (!) 58 63 (!) 57  Resp: _1 Temp: 97.6 F (36.4 C) 99.2 F (37.3 C) 98.3 F (36.8 C) 98.4 F (36.9 C)  TempSrc: Oral Oral Oral Oral  SpO2: 95% 95% 97% 93%  Weight:      Height:        Last BM Date: 05/04/16 (pt reports constipation, md notified)  Intake/Output   Yesterday:  01/13 0701 - 01/14 0700 In: 575 [P.O.:560] Out: 375 [Urine:200; Drains:175] This shift:  No intake/output data recorded.  Bowel function:  Flatus: YES  BM:  No  Drain: Bilious   Physical Exam:  General: Pt awake/alert/oriented x4 in No acute distress Psych:  No delerium/psychosis/paranoia HENT: Normocephalic, Mucus membranes moist.  No thrush Neck: Supple, No tracheal deviation Chest: Lungs CBTA anteriorly, No chest wall pain w good excursion, non labored breathing, no accessory muscle use CV:  Pulses intact.  Regular rhythm MS: Normal AROM mjr joints.  No obvious deformity Abdomen: Soft.  Nondistended.  Tenderness at drain site and TTP LLQ.  No evidence of peritonitis.   Ext:  SCDs BLE.  No mjr edema.  No cyanosis Skin: No petechiae / purpura  Results:   Labs: Results for orders placed or performed during the hospital encounter of 05/03/16 (  from the past 48 hour(s))  Comprehensive metabolic panel     Status: Abnormal   Collection Time: 05/07/16  2:34 PM  Result Value Ref Range   Sodium 136 135 - 145 mmol/L   Potassium 3.7 3.5 - 5.1 mmol/L   Chloride 106 101 - 111 mmol/L   CO2 24 22 - 32 mmol/L   Glucose, Bld 123 (H) 65 - 99 mg/dL   BUN 7 6 - 20 mg/dL   Creatinine, Ser 0.83 0.44 - 1.00 mg/dL   Calcium 7.8 (L) 8.9  - 10.3 mg/dL   Total Protein 5.1 (L) 6.5 - 8.1 g/dL   Albumin 2.5 (L) 3.5 - 5.0 g/dL   AST 18 15 - 41 U/L   ALT 60 (H) 14 - 54 U/L   Alkaline Phosphatase 257 (H) 38 - 126 U/L   Total Bilirubin 0.4 0.3 - 1.2 mg/dL   GFR calc non Af Amer >60 >60 mL/min   GFR calc Af Amer >60 >60 mL/min    Comment: (NOTE) The eGFR has been calculated using the CKD EPI equation. This calculation has not been validated in all clinical situations. eGFR's persistently <60 mL/min signify possible Chronic Kidney Disease.    Anion gap 6 5 - 15    Imaging / Studies: No results found.  Medications / Allergies: per chart  Antibiotics: Anti-infectives    Start     Dose/Rate Route Frequency Ordered Stop   05/06/16 1300  ciprofloxacin (CIPRO) tablet 500 mg     500 mg Oral 2 times daily 05/06/16 1216     05/04/16 1200  ciprofloxacin (CIPRO) IVPB 400 mg  Status:  Discontinued     400 mg 200 mL/hr over 60 Minutes Intravenous Every 12 hours 05/04/16 1056 05/06/16 1216        Note: Portions of this report may have been transcribed using voice recognition software. Every effort was made to ensure accuracy; however, inadvertent computerized transcription errors may be present.   Any transcriptional errors that result from this process are unintentional.   Edward Qualia, PA-S [facility]  _0 @  05/08/2016

## 2016-05-08 NOTE — Care Management Note (Signed)
Case Management Note  Patient Details  Name: Joann Herrera MRN: [id number] Date of Birth: Nov 15, 1923  Subjective/Objective:                  Abdominal pain  Action/Plan: CM spoke with the patient and her daughter at the bedside. CM provided the patient and her daughter with a list of home health agencies. They selected [facility]. Dona at [facility] notified of the referral. The patient's daughter Ivin Booty will be staying with her in her home for 3 weeks. She states her brothers will also be available to help them. Sharon's telephone number is [phone]. Patient states she has 2 walkers at home. Discussed the home health services the physician ordered and explained to the patient what the home health providers will do when they visit.   Expected Discharge Date:   (unknown)               Expected Discharge Plan:  [place]  In-House Referral:     Discharge planning Services  CM Consult  Post Acute Care Choice:  Home Health Choice offered to:  Patient, Adult Children  DME Arranged:  N/A DME Agency:  NA  HH Arranged:  PT, RN, Social Work, Nurse's Aide [place] Agency:  [employer] (now [facility])  Status of Service:  Completed, signed off  If discussed at [employer], dates discussed:    Additional Comments:  Apolonio Schneiders, RN 05/08/2016, 12:15 PM

## 2016-05-08 NOTE — Discharge Summary (Addendum)
Physician Discharge Summary  Joann Herrera IZT:[id number] DOB: 07/31/1923 DOA: 05/03/2016  PCP: Joann Rail, MD  Admit date: 05/03/2016 Discharge date: 05/08/2016  Time spent: 35 minutes  Recommendations for Outpatient Follow-up:  Chronic calculus cholecystitis -This maybein part the reason for her chronic abdominal pain -05/04/16--HIDA--no GB filling suggesting acute cholecystitis -05/03/16--MRCP--cholelithiasis with choledocholithiasis, chronic intra-and extra biliary dilatation -05/04/16--perc cholecystotomy tube placed as pt is high risk surgical candidate. Will need to remain 6-8 weeks.  Follow-up [name] 6-8 weeks, removal of cholecystostomy tube? - (Continuous drain. Irrigate once daily. Record output.  -continue Cipro. Patient to follow-up withDr. Johney Maine in 3-4 weeks to discusses cholecystectomy. 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 on reduced-dose Beryle Flock. CT 01/29/16 showed stable disease. Recent plan was to schedule PET scan.  -Per Dr. Truitt Herrera oncology If her chronic cholecystitis does not resolve, I will not continue Keytruda. I do not think she is a candidate for other anticancer treatment options such as chemo, due to her advanced age, comorbidities and poor performance status. -I again reviewed the option of palliative care and hospice. If her chronic cholecystitis cannot be managed by surgery or stenting, I think she will likely have persistent abdominal pain, and it willbe very difficult for her to come to clinic for follow-up and treatment. I again explained the role and the benefit of hospice, and encouraged her to reconsider. She previously declined hospice. She hasmet palliative care team in hospital, still wants to be full code and continue anticancer treatment.f/u appointment in 2-3 weeks  Dehydration -Resolved  FTT -Counseled  patient and daughter that they could slowly advance diet. Would steer clear of high fat containing food as this would most likely caused patient abdominal pain/N/V      Discharge Diagnoses:  Principal Problem:   Acute cholecystitis s/p perc drainage 05/04/2016 Active Problems:   GERD (gastroesophageal reflux disease)   Cholelithiasis   Dilated cbd, acquired   Cancer of left colon ([place])   Dehydration   Constipation   Abdominal pain   Calculus of gallbladder and bile duct with cholecystitis with obstruction   Right sided abdominal pain   Encounter for palliative care   Goals of care, counseling/discussion   Adenopathy, hilar   Discharge Condition:  Diet recommendation: Regular (advance diet slowly steer clear of high fat diet)  Filed Weights   05/03/16 1022  Weight: 55.3 kg (122 lb)    History of present illness:  83 y.o.WF PMHx Colon CA with suspected Metastases, squamous cell Cancer  Presenting with abdominal pain. She reports about 2 years of waxing/waning RUQ abdominal pain that is severe, aching, radiating to the back, sometimes worse with food. Has had nausea and vomiting in the past, but not today. Pain better with tylenol and po dilaudid. Has been eating less today and diffusely weak. She had been seen 4 times in the past 2 weeks for similar complaints.  During his hospitalization patient was treated for chronic calculus cholecystitis secondary to poorly differentiated colon adenocarcinoma stage IV with periportal nodal metastasis. S/P cholecystostomy drain which will need to remain in place for at least 4-6 weeks to allow for tract maturity before removing.    Consultants:  [facility] Dr. Loistine Herrera. Palliative Medicine  Dr. Truitt Herrera oncology  Dr.Glenn Kathlene Cote IR    Procedures/Significant Events:  1/10 S/P cholecystostomy drain will need to remain in place for at least 4-6 weeks  to allow for tract maturity before removing   Cultures 1/10  gallbladder NGTD     Antimicrobials: Anti-infectives    Start     Stop   05/06/16 1300  ciprofloxacin (CIPRO) tablet 500 mg         05/04/16 1200  ciprofloxacin (CIPRO) IVPB 400 mg  Status:  Discontinued     05/06/16 1216       Discharge Exam: Vitals:   05/07/16 0513 05/07/16 1436 05/07/16 2108 05/08/16 0621  BP: (!) 123/50 (!) 109/45 (!) 120/55 (!) 123/51  Pulse: 66 (!) 58 63 (!) 57  Resp: _0 Temp: 97.6 F (36.4 C) 99.2 F (37.3 C) 98.3 F (36.8 C) 98.4 F (36.9 C)  TempSrc: Oral Oral Oral Oral  SpO2: 95% 95% 97% 93%  Weight:      Height:        General: A/O 4, cachectic, appropriately tender for recent drain placement, No acute respiratory distress Eyes: negative scleral hemorrhage, negative anisocoria, negative icterus ENT: Negative Runny nose, negative gingival bleeding, Neck:  Negative scars, masses, torticollis, lymphadenopathy, JVD Lungs: Clear to auscultation bilaterally without wheezes or crackles Cardiovascular: Regular rate and rhythm without murmur gallop or rub normal S1 and S2 Abdomen: negative abdominal pain, nondistended, positive soft, bowel sounds, no rebound, no ascites, no appreciable mass, cholecystectomy drain in place right chest wall   Discharge Instructions   Allergies as of 05/08/2016      Reactions   Aspirin Other (See Comments)   Reaction: G I upset, hurts bladder   Codeine Nausea And Vomiting   Morphine And Related Nausea And Vomiting   Penicillins Hives, Swelling   Has patient had a PCN reaction causing immediate rash, facial/tongue/throat swelling, SOB or lightheadedness with hypotension: Yes Has patient had a PCN reaction causing severe rash involving mucus membranes or skin necrosis: No Has patient had a PCN reaction that required hospitalization No Has patient had a PCN reaction occurring within the last 10 years: No If all of the above answers are "NO", then may proceed with Cephalosporin use.      Medication  List    STOP taking these medications   HYDROmorphone 2 MG tablet Commonly known as:  DILAUDID   ondansetron 4 MG disintegrating tablet Commonly known as:  ZOFRAN ODT     TAKE these medications   acetaminophen 500 MG tablet Commonly known as:  TYLENOL Take 1,000 mg by mouth every 6 (six) hours as needed for mild pain or moderate pain.   ciprofloxacin 500 MG tablet Commonly known as:  CIPRO Take 1 tablet (500 mg total) by mouth 2 (two) times daily.   lidocaine-prilocaine cream Commonly known as:  EMLA Apply 1 application topically as needed. What changed:  when to take this  reasons to take this   ondansetron 8 MG tablet Commonly known as:  ZOFRAN Take 1 tablet (8 mg total) by mouth every 8 (eight) hours as needed for nausea or vomiting.   oxyCODONE-acetaminophen 5-325 MG tablet Commonly known as:  PERCOCET/ROXICET Take 1-2 tablets by mouth every 4 (four) hours as needed for moderate pain or severe pain.   SYSTANE OP Apply 1-2 drops to eye 4 (four) times daily as needed (for dry eye).   traMADol 50 MG tablet Commonly known as:  ULTRAM Take 1 tablet (50 mg total) by mouth every 8 (eight) hours as needed. What changed:  reasons to take this      Allergies  Allergen Reactions  . Aspirin  Other (See Comments)    Reaction: G I upset, hurts bladder  . Codeine Nausea And Vomiting  . Morphine And Related Nausea And Vomiting  . Penicillins Hives and Swelling    Has patient had a PCN reaction causing immediate rash, facial/tongue/throat swelling, SOB or lightheadedness with hypotension: Yes Has patient had a PCN reaction causing severe rash involving mucus membranes or skin necrosis: No Has patient had a PCN reaction that required hospitalization No Has patient had a PCN reaction occurring within the last 10 years: No If all of the above answers are "NO", then may proceed with Cephalosporin use.    Follow-up Information    GROSS,STEVEN C., MD. Schedule an appointment  as soon as possible for a visit in 3 week(s).   Specialty:  General Surgery Why:  for follow up from your recent hospitalization and to discuss possible gallbladder removal.  Contact information: [place] [address] [phone]        [facility] Follow up.   Specialty:  [place] Herrera Why:  home health nurse, physical therapy, social work and home health aide Contact information: [place] [address] [phone]        Joann Merle, MD. Schedule an appointment as soon as possible for a visit in 3 week(s).   Specialties:  Hematology, Oncology Why:   Follow-up with Dr. Truitt Herrera 2-3 weeks to discuss further cancer treatment options. Contact information: [address] [place] [address] [phone]        Azzie Roup, MD. Schedule an appointment as soon as possible for a visit in 8 week(s).   Specialty:  Interventional Radiology Why:  Follow-up [name] 6-8 weeks, removal of cholecystostomy tube? Contact information: [place] [address] [phone]            The results of significant diagnostics from this hospitalization (including imaging, microbiology, ancillary and laboratory) are listed below for reference.    Significant Diagnostic Studies: Ct Abdomen Pelvis Wo Contrast  Result Date: 04/25/2016 CLINICAL DATA:  Intermittent abdominal pain, history of colon cancer EXAM: CT ABDOMEN AND PELVIS WITHOUT CONTRAST TECHNIQUE: Multidetector CT imaging of the abdomen and pelvis was performed following the standard protocol without IV contrast. COMPARISON:  01/29/2016 FINDINGS: Lower chest: No pleural effusion is visualized. Mild subpleural fibrosis and bronchiectasis again evident. Slight increased consolidation within the lingula and anterior left lung base with associated mildly dilated bronchi. Right subpleural density slightly smaller. Now contains air  bronchograms. Patchy slightly nodular appearing infiltrates are present within the right greater than left posterior lung bases. Coronary artery calcifications. Heart size within normal limits. No large pericardial effusion. Hepatobiliary: Low-attenuation at the porta hepatis could relate to periportal edema. No gross focal hepatic abnormalities. Multiple stones present within the gallbladder lumen. No wall thickening. Extrahepatic common bile duct is enlarged, measuring up to 16 mm, similar compared to prior. Pancreas: No peripancreatic inflammation. Spleen: Normal in size without focal abnormality. Adrenals/Urinary Tract: Adrenal glands are within normal limits. Kidneys demonstrate no hydronephrosis or calcification. The bladder is normal. Stomach/Bowel: The stomach is nonenlarged. There is no dilated small bowel. Mild low sigmoid colon wall thickening presumably corresponds to history of colon cancer, this is less apparent compared to the previous exam. Appendix normal. Vascular/Lymphatic: Extensive atherosclerotic vascular disease of the aorta with focal ectasia of the infrarenal aorta. Porta hepatis adenopathy is poorly defined without contrast. Reproductive: Uterus and bilateral  adnexa are unremarkable. Other: No free air or free fluid. Musculoskeletal: Degenerative changes of the spine. No suspicious or acute bone lesions are visualized. IMPRESSION: 1. Slight increased consolidation within the lingula and anterior left lung base, could relate to inflammation or infection, but progression from prior exam makes malignancy difficult to exclude. 2. Slight increased nodular appearing infiltrates within the right greater than left posterior lung bases. 3. Multiple gallstones. Dilated extrahepatic common bowel duct is similar compared to the prior study. 4. Focal wall thickening involving the distal sigmoid colon presumably corresponds to the history of colon cancer. Porta hepatis adenopathy is poorly defined  without contrast. Electronically Signed   By: Donavan Foil M.D.   On: 04/25/2016 02:41   Nm Hepatobiliary Liver Func  Result Date: 05/04/2016 CLINICAL DATA:  Abnormal LFTs. Chronic worsening right upper quadrant abdominal pain. Initial encounter. EXAM: NUCLEAR MEDICINE HEPATOBILIARY IMAGING TECHNIQUE: Sequential images of the abdomen were obtained out to 60 minutes following intravenous administration of radiopharmaceutical. The gallbladder was not visualized. As the patient has an allergy to morphine, slow intravenous infusion of 1.1 mcg of cholecystokinin was performed. RADIOPHARMACEUTICALS:  5.05 mCi Tc-52mCholetec IV COMPARISON:  None. FINDINGS: Prompt uptake and biliary excretion of activity by the liver is seen. Biliary activity passes into small bowel, consistent with patent common bile duct. The gallbladder is not visualized at 45 minutes after administration of radiopharmaceutical. As the patient has an allergy to morphine, slow intravenous infusion of cholecystokinin was performed. The gallbladder was not visualized at 1 hour following initiation of infusion. This is most compatible with acute cholecystitis, though in some cases, chronic cholecystitis can have a similar appearance. IMPRESSION: Prompt uptake and biliary excretion of activity by the liver. Gallbladder not visualized, even after infusion of cholecystokinin. This is most compatible with acute cholecystitis, though in some cases, chronic cholecystitis can have a similar appearance. Electronically Signed   By: JGarald BaldingM.D.   On: 05/04/2016 02:38   UKoreaAbdomen Complete  Result Date: 05/03/2016 CLINICAL DATA:  Abdominal pain EXAM: ABDOMEN ULTRASOUND COMPLETE COMPARISON:  04/25/2016 FINDINGS: Gallbladder: Multiple gallstones are again identified stable from the prior CT examination. No gallbladder wall thickening or pericholecystic fluid is noted. Common bile duct: Diameter: Dilated at 11 mm. This is similar to that seen on prior CT  examination. Liver: No focal lesion identified. Within normal limits in parenchymal echogenicity. IVC: No abnormality visualized. Pancreas: Visualized portion unremarkable. Spleen: Size and appearance within normal limits. Right Kidney: Length: 9.4 cm. Echogenicity within normal limits. No mass or hydronephrosis visualized. Left Kidney: Length: 9.3 cm. Echogenicity within normal limits. No mass or hydronephrosis visualized. Abdominal aorta: No aneurysm visualized. Other findings: None. IMPRESSION: Cholelithiasis. Common bile duct dilatation stable from the previous exam. Correlation with bilirubin level is recommended. Electronically Signed   By: MInez CatalinaM.D.   On: 05/03/2016 15:06   Mr 3d Recon At Scanner  Result Date: 05/03/2016 CLINICAL DATA:  83year old female with history of colon cancer. Two year history of intermittent right upper quadrant abdominal pain. EXAM: MRI ABDOMEN WITHOUT AND WITH CONTRAST (INCLUDING MRCP) TECHNIQUE: Multiplanar multisequence MR imaging of the abdomen was performed both before and after the administration of intravenous contrast. Heavily T2-weighted images of the biliary and pancreatic ducts were obtained, and three-dimensional MRCP images were rendered by post processing. CONTRAST:  182mMULTIHANCE GADOBENATE DIMEGLUMINE 529 MG/ML IV SOLN COMPARISON:  CT scan 04/25/2016 and abdominal ultrasound 05/03/2016. FINDINGS: Lower chest: The lung bases demonstrate chronic basilar scarring and emphysema.  No effusions or pulmonary lesions. No pericardial effusion. Hepatobiliary: No focal hepatic lesions are identified. Geographic fatty infiltration is noted. There is stable intra and extrahepatic biliary dilatation. The gallbladder is filled with gallstones. The common bile duct is dilated to a maximum of 13.5 mm in the porta hepatis and 11.5 mm in the head of the pancreas. This is a stable finding. No common bile duct stones are identified. Pancreas:  No mass, inflammation or ductal  dilatation. Spleen:  Normal size.  No focal lesions. Adrenals/Urinary Tract: The adrenal glands and kidneys are unremarkable. Lower pole right renal cyst is noted. Stomach/Bowel: The stomach, duodenum, visualized small bowel and visualize colon are grossly normal. Vascular/Lymphatic: Stable atherosclerotic calcifications involving the aorta and branch vessels but no aneurysm or dissection. Small scattered mesenteric and retroperitoneal lymph nodes but no mass or overt adenopathy. Other:  No ascites or abdominal wall hernia. Musculoskeletal: No significant bony findings. IMPRESSION: 1. Cholelithiasis but no findings suspicious for acute cholecystitis and no common bile duct stones. Chronic intra and extrahepatic biliary dilatation. 2. Normal MR appearance of the pancreas. Normal caliber and course of the main pancreatic duct. Electronically Signed   By: Marijo Sanes M.D.   On: 05/03/2016 21:18   Ir Perc Cholecystostomy  Result Date: 05/04/2016 INDICATION: 83 year old female with acute cholecystitis EXAM: CHOLECYSTOSTOMY MEDICATIONS: Patient is receiving Cipro in the hospital; The antibiotic was administered within an appropriate time frame prior to the initiation of the procedure. ANESTHESIA/SEDATION: Moderate (conscious) sedation was employed during this procedure. A total of Versed 2 point mg and Fentanyl 50 mcg was administered intravenously. Moderate Sedation Time: 20 minutes. The patient's level of consciousness and vital signs were monitored continuously by radiology nursing throughout the procedure under my direct supervision. FLUOROSCOPY TIME:  Fluoroscopy Time: 1 minutes 18 seconds (7.4 mGy). COMPLICATIONS: None PROCEDURE: Informed written consent was obtained from the patient and the patient's family after a thorough discussion of the procedural risks, benefits and alternatives. All questions were addressed. Maximal Sterile Barrier Technique was utilized including caps, mask, sterile gowns, sterile  gloves, sterile drape, hand hygiene and skin antiseptic. A timeout was performed prior to the initiation of the procedure. Ultrasound survey of the right upper quadrant was performed for planning purposes. Once the patient is prepped and draped in the usual sterile fashion, the skin and subcutaneous tissues overlying the gallbladder were generously infiltrated 1% lidocaine for local anesthesia. A coaxial needle was advanced under ultrasound guidance through the skin subcutaneous tissues and a small segment of liver into the gallbladder lumen. With removal of the stylet, spontaneous dark bile drainage occurred. Using modified Seldinger technique, a 10 French drain was placed into the gallbladder fossa, with aspiration of the sample for the lab. Contrast injection confirmed position of the tube within the gallbladder lumen. Drainage catheter was attached to gravity drain with a suture retention placed. Patient tolerated the procedure well and remained hemodynamically stable throughout. No complications were encountered and no significant blood loss encountered. IMPRESSION: Status post percutaneous cholecystostomy. Signed, Dulcy Fanny. Earleen Newport, DO [facility] Radiology Electronically Signed   By: Corrie Mckusick D.O.   On: 05/04/2016 16:32   Mr Abdomen Mrcp Moise Boring Contast  Result Date: 05/03/2016 CLINICAL DATA:  83 year old female with history of colon cancer. Two year history of intermittent right upper quadrant abdominal pain. EXAM: MRI ABDOMEN WITHOUT AND WITH CONTRAST (INCLUDING MRCP) TECHNIQUE: Multiplanar multisequence MR imaging of the abdomen was performed both before and after the administration of  intravenous contrast. Heavily T2-weighted images of the biliary and pancreatic ducts were obtained, and three-dimensional MRCP images were rendered by post processing. CONTRAST:  45m MULTIHANCE GADOBENATE DIMEGLUMINE 529 MG/ML IV SOLN COMPARISON:  CT scan 04/25/2016 and  abdominal ultrasound 05/03/2016. FINDINGS: Lower chest: The lung bases demonstrate chronic basilar scarring and emphysema. No effusions or pulmonary lesions. No pericardial effusion. Hepatobiliary: No focal hepatic lesions are identified. Geographic fatty infiltration is noted. There is stable intra and extrahepatic biliary dilatation. The gallbladder is filled with gallstones. The common bile duct is dilated to a maximum of 13.5 mm in the porta hepatis and 11.5 mm in the head of the pancreas. This is a stable finding. No common bile duct stones are identified. Pancreas:  No mass, inflammation or ductal dilatation. Spleen:  Normal size.  No focal lesions. Adrenals/Urinary Tract: The adrenal glands and kidneys are unremarkable. Lower pole right renal cyst is noted. Stomach/Bowel: The stomach, duodenum, visualized small bowel and visualize colon are grossly normal. Vascular/Lymphatic: Stable atherosclerotic calcifications involving the aorta and branch vessels but no aneurysm or dissection. Small scattered mesenteric and retroperitoneal lymph nodes but no mass or overt adenopathy. Other:  No ascites or abdominal wall hernia. Musculoskeletal: No significant bony findings. IMPRESSION: 1. Cholelithiasis but no findings suspicious for acute cholecystitis and no common bile duct stones. Chronic intra and extrahepatic biliary dilatation. 2. Normal MR appearance of the pancreas. Normal caliber and course of the main pancreatic duct. Electronically Signed   By: PMarijo SanesM.D.   On: 05/03/2016 21:18    Microbiology: Recent Results (from the past 240 hour(s))  Aerobic/Anaerobic Culture (surgical/deep wound)     Status: None (Preliminary result)   Collection Time: 05/04/16  4:21 PM  Result Value Ref Range Status   Specimen Description GALL BLADDER BILE  Final   Special Requests NONE  Final   Gram Stain   Final    ABUNDANT WBC PRESENT, PREDOMINANTLY PMN RARE GRAM POSITIVE COCCI IN PAIRS    Culture   Final    NO  GROWTH 2 DAYS NO ANAEROBES ISOLATED; CULTURE IN PROGRESS FOR 5 DAYS [facility]   Report Status PENDING  Incomplete     Labs: Basic Metabolic Panel:  Recent Labs Lab 05/03/16 1220 05/04/16 1013 05/05/16 0502 05/07/16 1434  NA 136 138 138 136  K 4.3 3.8 3.8 3.7  CL 102 107 108 106  CO2 _0 GLUCOSE 109* 102* 112* 123*  BUN 21* _1 CREATININE 0.95 0.83 0.87 0.83  CALCIUM 8.6* 7.9* 8.1* 7.8*   Liver Function Tests:  Recent Labs Lab 05/03/16 1220 05/04/16 1013 05/05/16 0502 05/07/16 1434  AST 431* 237* 99* 18  ALT 143* 254* 172* 60*  ALKPHOS 550* 462* 377* 257*  BILITOT 0.8 0.6 0.8 0.4  PROT 6.5 5.8* 5.9* 5.1*  ALBUMIN 3.3* 2.9* 2.8* 2.5*    Recent Labs Lab 05/03/16 1220  LIPASE 83*   No results for input(s): AMMONIA in the last 168 hours. CBC:  Recent Labs Lab 05/03/16 1220 05/04/16 1013 05/05/16 0502  WBC 8.2 4.2 5.1  NEUTROABS 6.0  --   --   HGB 11.2* 9.2* 9.8*  HCT 34.9* 29.4* 30.6*  MCV 86.4 86.7 88.7  PLT 284 185 121*   Cardiac Enzymes: No results for input(s): CKTOTAL, CKMB, CKMBINDEX, TROPONINI in the last 168 hours. BNP: BNP (last 3 results) No results for input(s): BNP in the last 8760 hours.  ProBNP (last 3  results) No results for input(s): PROBNP in the last 8760 hours.  CBG: No results for input(s): GLUCAP in the last 168 hours.     Signed:  Dia Crawford, MD [facility] [phone] pager

## 2016-05-08 NOTE — Progress Notes (Signed)
Physical Therapy Treatment Patient Details Name: Joann Herrera MRN: [id number] DOB: Aug 28, 1923 Today's Date: 05/08/2016    History of Present Illness Pt is a 83 y/o female w/ h/o colon ca w/ suspected mets. She was admitted to [facility] hospital w/ c/o R upper quadrant pain w/ nausea and vomiting. See EPIC for PMH. She lives alone and ambulates with a RW.     PT Comments    Pt is planning on going home now with family A.  Answered patient and daughter's questions to their satisfaction. Recommend using RW at home and pt and daughter verbalized understanding.  Follow Up Recommendations  Home health PT;Supervision/Assistance - 24 hour     Equipment Recommendations  None recommended by PT    Recommendations for Other Services       Precautions / Restrictions Precautions Precautions: Fall Restrictions Weight Bearing Restrictions: No    Mobility  Bed Mobility Overal bed mobility: Needs Assistance Bed Mobility: Rolling;Sidelying to Sit Rolling: Min assist Sidelying to sit: Min assist       General bed mobility comments: Pt c/o belly pain, so educated on body mechanics with bed mobility.  Transfers Overall transfer level: Needs assistance Equipment used: 1 person hand held assist Transfers: Sit to/from Stand Sit to Stand: Min guard         General transfer comment: min/guard for steadying  Ambulation/Gait Ambulation/Gait assistance: Min assist Ambulation Distance (Feet): 400 Feet Assistive device: 1 person hand held assist (IV pole) Gait Pattern/deviations: Step-through pattern;Drifts right/left     General Gait Details: Amb with IV pole and HHA with drifting R and L, but no LOB   Stairs            Wheelchair Mobility    Modified Rankin (Stroke Patients Only)       Balance Overall balance assessment: Needs assistance Sitting-balance support: Feet supported Sitting balance-Leahy Scale: Good     Standing balance support: During functional  activity Standing balance-Leahy Scale: Fair                      Cognition Arousal/Alertness: Awake/alert Behavior During Therapy: WFL for tasks assessed/performed Overall Cognitive Status: Within Functional Limits for tasks assessed                      Exercises      General Comments        Pertinent Vitals/Pain      Home Living                      Prior Function            PT Goals (current goals can now be found in the care plan section) Acute Rehab PT Goals PT Goal Formulation: With patient/family Time For Goal Achievement: 05/18/16 Potential to Achieve Goals: Good Progress towards PT goals: Progressing toward goals    Frequency    Min 3X/week      PT Plan Discharge plan needs to be updated    Co-evaluation             End of Session   Activity Tolerance: Patient tolerated treatment well Patient left: in chair;with call bell/phone within reach;with family/visitor present     Time: [id number] (MD in for > 10 mins. no charge for that time) PT Time Calculation (min) (ACUTE ONLY): 26 min  Charges:  $Gait Training: 8-22 mins  G Codes:      Pessy Delamar LUBECK 05/08/2016, 2:21 PM

## 2016-05-09 ENCOUNTER — Other Ambulatory Visit: Payer: Self-pay | Admitting: Surgery

## 2016-05-09 ENCOUNTER — Telehealth: Payer: Self-pay

## 2016-05-09 DIAGNOSIS — K810 Acute cholecystitis: Secondary | ICD-10-CM

## 2016-05-09 LAB — AEROBIC/ANAEROBIC CULTURE W GRAM STAIN (SURGICAL/DEEP WOUND): Culture: NO GROWTH

## 2016-05-09 NOTE — Telephone Encounter (Signed)
Pt in on TCM list. Dc'ed on 05/08/2016 for right sided abdominal pain; adenopathy, hilar; abnomral LFT's; dilated bile duct chelilithiasis. 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

## 2016-05-10 ENCOUNTER — Telehealth: Payer: Self-pay | Admitting: *Deleted

## 2016-05-10 ENCOUNTER — Telehealth: Payer: Self-pay | Admitting: Hematology

## 2016-05-10 MED FILL — ONDANSETRON HCL 8 MG TABLET: 8 | 10 days supply | Qty: 30 | Fill #1

## 2016-05-10 MED FILL — OXYCODONE W/APAP 5/325 TAB: 5-325 | 1 days supply | Qty: 10 | Fill #0

## 2016-05-10 NOTE — Telephone Encounter (Signed)
"  My mom need a refill on zofran for nausea."  Advised to call Pharmacy.  Two refills noted with the 04-29-2016 order."  They threw the bottles away and put her medicines in a zip lock bag."  [facility] number provided.

## 2016-05-10 NOTE — Telephone Encounter (Signed)
sw pt dtr to confrim r/s lab/fl/ov appts to 05/27/16 at 1215 per LOS. Pt aware of cxled PET scan on 1/18 per MD instructions

## 2016-05-10 NOTE — Telephone Encounter (Signed)
I have sent a message to scheduler to cancel her PET and f/u this week, will reschedule lab and f/u in 2-3 weeks. Please let her daughter know, thanks.   Truitt Merle MD

## 2016-05-10 NOTE — Telephone Encounter (Signed)
Daughter called concerning PET scan and appt with Dr Burr Medico this week. States her brother said they were cancelled. Please advise.

## 2016-05-12 ENCOUNTER — Encounter (HOSPITAL_COMMUNITY): Payer: Self-pay

## 2016-05-12 ENCOUNTER — Observation Stay (HOSPITAL_COMMUNITY)
Admission: EM | Admit: 2016-05-12 | Discharge: 2016-05-13 | Disposition: A | Discharge status: Home / Self Care | Payer: Medicare Other | Attending: Emergency Medicine | Admitting: Emergency Medicine

## 2016-05-12 ENCOUNTER — Telehealth: Payer: Self-pay | Admitting: *Deleted

## 2016-05-12 ENCOUNTER — Ambulatory Visit (HOSPITAL_COMMUNITY): Payer: Medicare Other

## 2016-05-12 ENCOUNTER — Emergency Department (HOSPITAL_COMMUNITY): Payer: Medicare Other

## 2016-05-12 DIAGNOSIS — R1011 Right upper quadrant pain: Secondary | ICD-10-CM | POA: Diagnosis present

## 2016-05-12 DIAGNOSIS — K5730 Diverticulosis of large intestine without perforation or abscess without bleeding: Secondary | ICD-10-CM | POA: Diagnosis not present

## 2016-05-12 DIAGNOSIS — F329 Major depressive disorder, single episode, unspecified: Secondary | ICD-10-CM | POA: Diagnosis present

## 2016-05-12 DIAGNOSIS — R52 Pain, unspecified: Secondary | ICD-10-CM | POA: Diagnosis not present

## 2016-05-12 DIAGNOSIS — R1084 Generalized abdominal pain: Secondary | ICD-10-CM | POA: Diagnosis not present

## 2016-05-12 DIAGNOSIS — F32A Depression, unspecified: Secondary | ICD-10-CM | POA: Diagnosis present

## 2016-05-12 DIAGNOSIS — C799 Secondary malignant neoplasm of unspecified site: Secondary | ICD-10-CM

## 2016-05-12 DIAGNOSIS — Z96653 Presence of artificial knee joint, bilateral: Secondary | ICD-10-CM | POA: Insufficient documentation

## 2016-05-12 DIAGNOSIS — C801 Malignant (primary) neoplasm, unspecified: Secondary | ICD-10-CM | POA: Diagnosis not present

## 2016-05-12 DIAGNOSIS — C785 Secondary malignant neoplasm of large intestine and rectum: Principal | ICD-10-CM | POA: Insufficient documentation

## 2016-05-12 DIAGNOSIS — F17200 Nicotine dependence, unspecified, uncomplicated: Secondary | ICD-10-CM | POA: Insufficient documentation

## 2016-05-12 DIAGNOSIS — C189 Malignant neoplasm of colon, unspecified: Secondary | ICD-10-CM | POA: Diagnosis present

## 2016-05-12 DIAGNOSIS — D649 Anemia, unspecified: Secondary | ICD-10-CM | POA: Diagnosis not present

## 2016-05-12 DIAGNOSIS — Z8673 Personal history of transient ischemic attack (TIA), and cerebral infarction without residual deficits: Secondary | ICD-10-CM | POA: Insufficient documentation

## 2016-05-12 DIAGNOSIS — K810 Acute cholecystitis: Secondary | ICD-10-CM | POA: Diagnosis present

## 2016-05-12 LAB — URINALYSIS, ROUTINE W REFLEX MICROSCOPIC
BILIRUBIN URINE: NEGATIVE
Bacteria, UA: NONE SEEN
Glucose, UA: NEGATIVE mg/dL
HGB URINE DIPSTICK: NEGATIVE
Ketones, ur: NEGATIVE mg/dL
NITRITE: NEGATIVE
PH: 6 (ref 5.0–8.0)
Protein, ur: NEGATIVE mg/dL
SPECIFIC GRAVITY, URINE: 1.017 (ref 1.005–1.030)

## 2016-05-12 LAB — COMPREHENSIVE METABOLIC PANEL
ALBUMIN: 2.9 g/dL — AB (ref 3.5–5.0)
ALT: 39 U/L (ref 14–54)
AST: 34 U/L (ref 15–41)
Alkaline Phosphatase: 341 U/L — ABNORMAL HIGH (ref 38–126)
Anion gap: 7 (ref 5–15)
BILIRUBIN TOTAL: 0.2 mg/dL — AB (ref 0.3–1.2)
BUN: 19 mg/dL (ref 6–20)
CALCIUM: 8.3 mg/dL — AB (ref 8.9–10.3)
CO2: 23 mmol/L (ref 22–32)
CREATININE: 0.81 mg/dL (ref 0.44–1.00)
Chloride: 106 mmol/L (ref 101–111)
GFR calc Af Amer: >60 mL/min (ref >60)
GFR calc non Af Amer: >60 mL/min (ref >60)
GLUCOSE: 95 mg/dL (ref 65–99)
Potassium: 4.1 mmol/L (ref 3.5–5.1)
SODIUM: 136 mmol/L (ref 135–145)
TOTAL PROTEIN: 6.2 g/dL — AB (ref 6.5–8.1)

## 2016-05-12 LAB — CBC WITH DIFFERENTIAL/PLATELET
BASOS PCT: 0 %
Basophils Absolute: 0 10*3/uL (ref 0.0–0.1)
Eosinophils Absolute: 0.4 10*3/uL (ref 0.0–0.7)
Eosinophils Relative: 7 %
HEMATOCRIT: 30.4 % — AB (ref 36.0–46.0)
HEMOGLOBIN: 9.9 g/dL — AB (ref 12.0–15.0)
Lymphocytes Relative: 22 %
Lymphs Abs: 1.5 10*3/uL (ref 0.7–4.0)
MCH: 28.6 pg (ref 26.0–34.0)
MCHC: 32.6 g/dL (ref 30.0–36.0)
MCV: 87.9 fL (ref 78.0–100.0)
MONOS PCT: 9 %
Monocytes Absolute: 0.6 10*3/uL (ref 0.1–1.0)
NEUTROS ABS: 4.1 10*3/uL (ref 1.7–7.7)
NEUTROS PCT: 62 %
Platelets: 303 10*3/uL (ref 150–400)
RBC: 3.46 MIL/uL — AB (ref 3.87–5.11)
RDW: 14.5 % (ref 11.5–15.5)
WBC: 6.7 10*3/uL (ref 4.0–10.5)

## 2016-05-12 LAB — I-STAT CG4 LACTIC ACID, ED: Lactic Acid, Venous: 0.76 mmol/L (ref 0.5–1.9)

## 2016-05-12 LAB — LIPASE, BLOOD: LIPASE: 110 U/L — AB (ref 11–51)

## 2016-05-12 MED ORDER — IOPAMIDOL (ISOVUE-300) INJECTION 61%
100.0000 mL | Freq: Once | INTRAVENOUS | Status: AC | PRN
  Administered 2016-05-12: 80 mL via INTRAVENOUS

## 2016-05-12 MED ORDER — POLYETHYLENE GLYCOL 3350 17 G PO PACK: 17.0000 g | PACK | Freq: Every day | ORAL | Status: DC | PRN

## 2016-05-12 MED ORDER — POLYVINYL ALCOHOL 1.4 % OP SOLN
Freq: Four times a day (QID) | OPHTHALMIC | Status: DC | PRN
  Filled 2016-05-12: qty 15

## 2016-05-12 MED ORDER — BISACODYL 5 MG PO TBEC: 5.0000 mg | DELAYED_RELEASE_TABLET | Freq: Every day | ORAL | Status: DC | PRN

## 2016-05-12 MED ORDER — ONDANSETRON HCL 4 MG PO TABS: 4.0000 mg | ORAL_TABLET | Freq: Four times a day (QID) | ORAL | Status: DC | PRN

## 2016-05-12 MED ORDER — SODIUM CHLORIDE 0.9 % IV SOLN
INTRAVENOUS | Status: AC
  Administered 2016-05-13: via INTRAVENOUS

## 2016-05-12 MED ORDER — ENOXAPARIN SODIUM 40 MG/0.4ML ~~LOC~~ SOLN
40.0000 mg | Freq: Every day | SUBCUTANEOUS | Status: DC
  Administered 2016-05-13: 40 mg via SUBCUTANEOUS
  Filled 2016-05-12: qty 0.4

## 2016-05-12 MED ORDER — HYDROMORPHONE HCL 1 MG/ML IJ SOLN
1.0000 mg | Freq: Once | INTRAMUSCULAR | Status: AC
  Administered 2016-05-12: 1 mg via INTRAVENOUS
  Filled 2016-05-12: qty 1

## 2016-05-12 MED ORDER — DOCUSATE SODIUM 100 MG PO CAPS
100.0000 mg | ORAL_CAPSULE | Freq: Two times a day (BID) | ORAL | Status: DC
  Administered 2016-05-13 (×2): 100 mg via ORAL
  Filled 2016-05-12 (×2): qty 1

## 2016-05-12 MED ORDER — ACETAMINOPHEN 500 MG PO TABS: 1000.0000 mg | ORAL_TABLET | Freq: Four times a day (QID) | ORAL | Status: DC | PRN

## 2016-05-12 MED ORDER — HYDROMORPHONE HCL 2 MG PO TABS
2.0000 mg | ORAL_TABLET | Freq: Four times a day (QID) | ORAL | Status: DC | PRN
  Administered 2016-05-13: 2 mg via ORAL
  Filled 2016-05-12: qty 1

## 2016-05-12 MED ORDER — HYDROMORPHONE HCL 1 MG/ML IJ SOLN
1.0000 mg | INTRAMUSCULAR | Status: DC | PRN
  Administered 2016-05-13: 1 mg via INTRAVENOUS
  Filled 2016-05-12: qty 1

## 2016-05-12 MED ORDER — IOPAMIDOL (ISOVUE-300) INJECTION 61%
INTRAVENOUS | Status: AC
  Filled 2016-05-12: qty 30

## 2016-05-12 MED ORDER — ONDANSETRON HCL 4 MG/2ML IJ SOLN
4.0000 mg | Freq: Once | INTRAMUSCULAR | Status: AC
  Administered 2016-05-12: 4 mg via INTRAVENOUS
  Filled 2016-05-12: qty 2

## 2016-05-12 MED ORDER — ONDANSETRON HCL 4 MG/2ML IJ SOLN
4.0000 mg | Freq: Four times a day (QID) | INTRAMUSCULAR | Status: DC | PRN
  Administered 2016-05-13: 4 mg via INTRAVENOUS
  Filled 2016-05-12: qty 2

## 2016-05-12 MED ORDER — CIPROFLOXACIN HCL 500 MG PO TABS
500.0000 mg | ORAL_TABLET | Freq: Two times a day (BID) | ORAL | Status: DC
  Administered 2016-05-13 (×2): 500 mg via ORAL
  Filled 2016-05-12 (×2): qty 1

## 2016-05-12 NOTE — ED Notes (Signed)
Disregard Pulse and SpO2 from 2100. Sensor was placed poorly. Accurate reading charted at 2105.

## 2016-05-12 NOTE — H&P (Signed)
History and Physical    Joann Herrera [id number] DOB: 09/01/1923 DOA: 05/12/2016  PCP: [name], MD   Patient coming from: Home  Chief Complaint: Abdominal pain   HPI: Joann Herrera is a 83 y.o. female with medical history significant for metastatic colon cancer and acute cholecystitis status post recent cholecystostomy tube placement, now presenting to the emergency department for evaluation of intractable right-sided abdominal pain. Patient reports severe abdominal pain going back months and reports that the pain has not improved since placement of the cholecystostomy tube. She now reports that anytime she moves or takes a deep breath, she experiences severe pain in the right upper quadrant with radiation through to her back. She was discharged from the hospital 4 days ago with Percocet, but reports that this has not provided any relief. Her oncologist prescribed oral Dilaudid, but the patient reports that this has also been ineffective. She denies any recent fevers or chills, endorses a good appetite, denies vomiting or diarrhea, and denies chest pain, palpitations, dyspnea, or cough.  ED Course: Upon arrival to the ED, patient is found to be afebrile, saturating well on room air, and with vitals otherwise stable. Chemistry panels notable for elevation in alkaline phosphatase to 341 and serum albumin of 2.9. CBC features a stable normocytic anemia with hemoglobin of 9.9. Lactic acid is reassuring at 0.76 and urinalysis is unremarkable. CT of the abdomen and pelvis is negative for acute intra-abdominal or pelvic abnormality and notable for cholecystostomy tube in place without any complicating features. Patient was treated with multiple doses of IV Dilaudid in the emergency department and began to report some subjective improvement. She will continue to be in severe pain, however, and will be observed on the medical surgical unit for ongoing evaluation and management of intractable  right-sided abdominal pain.   Review of Systems:  All other systems reviewed and apart from HPI, are negative.  Past Medical History:  Diagnosis Date  . Cancer (HCC)    squamous cell  . Colon carcinoma ([place])    dx. left Colon cancer,evidence of metastasis  . Complication of anesthesia    "morphine caused severe shakes"  . GERD (gastroesophageal reflux disease)   . Macular degeneration   . Pneumonia 04/2012  . PONV (postoperative nausea and vomiting)   . Rheumatoid Arthritis     Past Surgical History:  Procedure Laterality Date  . CATARACT EXTRACTION, BILATERAL    . Cataracts    . COLONOSCOPY WITH PROPOFOL N/A 04/30/2015   Procedure: COLONOSCOPY WITH PROPOFOL;  Surgeon: Milus Banister, MD;  Location: [facility] ENDOSCOPY;  Service: Endoscopy;  Laterality: N/A;  . ESOPHAGOGASTRODUODENOSCOPY N/A 01/22/2015   Procedure: ESOPHAGOGASTRODUODENOSCOPY (EGD);  Surgeon: Milus Banister, MD;  Location: [facility];  Service: Endoscopy;  Laterality: N/A;  . EYE SURGERY     bilateral cataracts  . HAND SURGERY     Pt states she had both hands operated on for arthritis.  Marland Kitchen HAND SURGERY    . HEMORRHOID SURGERY    . IR GENERIC HISTORICAL  02/12/2016   IR US GUIDE VASC ACCESS RIGHT 02/12/2016 Corrie Mckusick, DO [facility]  . IR GENERIC HISTORICAL  02/12/2016   IR FLUORO GUIDE PORT INSERTION RIGHT 02/12/2016 Corrie Mckusick, DO [facility]  . IR GENERIC HISTORICAL  05/04/2016   IR PERC CHOLECYSTOSTOMY 05/04/2016 Corrie Mckusick, DO [facility]  . JOINT REPLACEMENT    . MINOR HEMORRHOIDECTOMY    . SHOULDER ARTHROSCOPY Left   . SHOULDER SURGERY Left  arthroscopy  . TONSILLECTOMY    . TOTAL KNEE ARTHROPLASTY     x 2  . TOTAL KNEE ARTHROPLASTY Right   . TOTAL KNEE ARTHROPLASTY Left      reports that she has been smoking.  She has been smoking about 1.00 pack per day. She has never used smokeless tobacco. She reports that she does not drink alcohol or use drugs.  Allergies  Allergen Reactions  .  Aspirin Other (See Comments)    Reaction: G I upset, hurts bladder  . Codeine Nausea And Vomiting  . Morphine And Related Nausea And Vomiting  . Penicillins Hives and Swelling    Has patient had a PCN reaction causing immediate rash, facial/tongue/throat swelling, SOB or lightheadedness with hypotension: Yes Has patient had a PCN reaction causing severe rash involving mucus membranes or skin necrosis: No Has patient had a PCN reaction that required hospitalization No Has patient had a PCN reaction occurring within the last 10 years: No If all of the above answers are "NO", then may proceed with Cephalosporin use.     Family History  Problem Relation Age of Onset  . Cancer Sister 57    breast  . Diabetes Sister   . Arthritis Mother   . Arthritis-Osteo Mother   . Diabetes Brother   . Suicidality Brother   . Diabetes Brother   . Breast cancer Sister   . Cancer Maternal Aunt     breast cancer      Prior to Admission medications   Medication Sig Start Date End Date Taking? Authorizing Provider  acetaminophen (TYLENOL) 500 MG tablet Take 1,000 mg by mouth every 6 (six) hours as needed for mild pain or moderate pain.    Yes Historical Provider, MD  ciprofloxacin (CIPRO) 500 MG tablet Take 1 tablet (500 mg total) by mouth 2 (two) times daily. 05/08/16  Yes Allie Bossier, MD  HYDROmorphone (DILAUDID) 2 MG tablet Take 2 mg by mouth every 6 (six) hours as needed for moderate pain or severe pain.  04/29/16  Yes Historical Provider, MD  lidocaine-prilocaine (EMLA) cream Apply 1 application topically as needed. Patient taking differently: Apply 1 application topically daily as needed (port access).  04/08/16  Yes Owens Shark, NP  ondansetron (ZOFRAN) 8 MG tablet Take 1 tablet (8 mg total) by mouth every 8 (eight) hours as needed for nausea or vomiting. 04/29/16  Yes Truitt Merle, MD  oxyCODONE-acetaminophen (PERCOCET/ROXICET) 5-325 MG tablet Take 1-2 tablets by mouth every 4 (four) hours as needed  for moderate pain or severe pain. 05/08/16  Yes Allie Bossier, MD  Polyethyl Glycol-Propyl Glycol (SYSTANE OP) Apply 1-2 drops to eye 4 (four) times daily as needed (for dry eye).    Historical Provider, MD  traMADol (ULTRAM) 50 MG tablet Take 1 tablet (50 mg total) by mouth every 8 (eight) hours as needed. Patient taking differently: Take 50 mg by mouth every 8 (eight) hours as needed for moderate pain or severe pain.  04/08/16   Owens Shark, NP    Physical Exam: Vitals:   05/12/16 1636 05/12/16 1944  BP: 122/60 (!) 119/106  Pulse: 63 68  Resp: 16 17  Temp: 97.5 F (36.4 C)   TempSrc: Oral   SpO2: 96% 98%  Weight: 55.3 kg (122 lb)   Height: 5' 4.5" (1.638 m)       Constitutional: NAD, appears uncomfortable  Eyes: PERTLA, lids and conjunctivae normal ENMT: Mucous membranes are moist. Posterior pharynx clear of any  exudate or lesions.   Neck: normal, supple, no masses, no thyromegaly Respiratory: clear to auscultation bilaterally, no wheezing, no crackles. Normal respiratory effort.   Cardiovascular: S1 & S2 heard, regular rate and rhythm No extremity edema. No significant JVD. Abdomen: No distension, tender in right upper and lower quadrants, no rebound pain or guarding. Drain in RUQ with bilious output. Bowel sounds normal.  Musculoskeletal: no clubbing / cyanosis. No joint deformity upper and lower extremities.    Skin: no significant rashes, lesions, ulcers. Warm, dry, well-perfused. Neurologic: CN 2-12 grossly intact. Sensation intact, DTR normal. Strength 5/5 in all 4 limbs.  Psychiatric: Normal judgment and insight. Alert and oriented x 3. Generally angry     Labs on Admission: I have personally reviewed following labs and imaging studies  CBC:  Recent Labs Lab 05/12/16 1743  WBC 6.7  NEUTROABS 4.1  HGB 9.9*  HCT 30.4*  MCV 87.9  PLT [id number]   Basic Metabolic Panel:  Recent Labs Lab 05/07/16 1434 05/12/16 1743  NA 136 136  K 3.7 4.1  CL 106 106  CO2 24  23  GLUCOSE 123* 95  BUN 7 19  CREATININE 0.83 0.81  CALCIUM 7.8* 8.3*   GFR: Estimated Creatinine Clearance: 40.3 mL/min (by C-G formula based on SCr of 0.81 mg/dL). Liver Function Tests:  Recent Labs Lab 05/07/16 1434 05/12/16 1743  AST 18 34  ALT 60* 39  ALKPHOS 257* 341*  BILITOT 0.4 0.2*  PROT 5.1* 6.2*  ALBUMIN 2.5* 2.9*    Recent Labs Lab 05/12/16 1743  LIPASE 110*   No results for input(s): AMMONIA in the last 168 hours. Coagulation Profile: No results for input(s): INR, PROTIME in the last 168 hours. Cardiac Enzymes: No results for input(s): CKTOTAL, CKMB, CKMBINDEX, TROPONINI in the last 168 hours. BNP (last 3 results) No results for input(s): PROBNP in the last 8760 hours. HbA1C: No results for input(s): HGBA1C in the last 72 hours. CBG: No results for input(s): GLUCAP in the last 168 hours. Lipid Profile: No results for input(s): CHOL, HDL, LDLCALC, TRIG, CHOLHDL, LDLDIRECT in the last 72 hours. Thyroid Function Tests: No results for input(s): TSH, T4TOTAL, FREET4, T3FREE, THYROIDAB in the last 72 hours. Anemia Panel: No results for input(s): VITAMINB12, FOLATE, FERRITIN, TIBC, IRON, RETICCTPCT in the last 72 hours. Urine analysis:    Component Value Date/Time   COLORURINE YELLOW 05/12/2016 1655   APPEARANCEUR CLEAR 05/12/2016 1655   LABSPEC 1.017 05/12/2016 1655   LABSPEC 1.025 12/25/2015 1056   PHURINE 6.0 05/12/2016 1655   GLUCOSEU NEGATIVE 05/12/2016 1655   GLUCOSEU Negative 12/25/2015 1056   HGBUR NEGATIVE 05/12/2016 1655   BILIRUBINUR NEGATIVE 05/12/2016 1655   BILIRUBINUR Negative 12/25/2015 1056   KETONESUR NEGATIVE 05/12/2016 1655   PROTEINUR NEGATIVE 05/12/2016 1655   UROBILINOGEN 0.2 12/25/2015 1056   NITRITE NEGATIVE 05/12/2016 1655   LEUKOCYTESUR SMALL (A) 05/12/2016 1655   LEUKOCYTESUR Negative 12/25/2015 1056   Sepsis Labs: @LABRCNTIP (procalcitonin:4,lacticidven:4) ) Recent Results (from the past 240 hour(s))    Aerobic/Anaerobic Culture (surgical/deep wound)     Status: None   Collection Time: 05/04/16  4:21 PM  Result Value Ref Range Status   Specimen Description GALL BLADDER BILE  Final   Special Requests NONE  Final   Gram Stain   Final    ABUNDANT WBC PRESENT, PREDOMINANTLY PMN RARE GRAM POSITIVE COCCI IN PAIRS    Culture   Final    No growth aerobically or anaerobically. Performed at [facility]  Report Status 05/09/2016 FINAL  Final     Radiological Exams on Admission: Ct Abdomen Pelvis W Contrast  Result Date: 05/12/2016 CLINICAL DATA:  Patient status post placement of percutaneous cholecystostomy 05/04/2016. Continued abdominal pain. EXAM: CT ABDOMEN AND PELVIS WITH CONTRAST TECHNIQUE: Multidetector CT imaging of the abdomen and pelvis was performed using the standard protocol following bolus administration of intravenous contrast. CONTRAST:  80 ml ISOVUE-300 IOPAMIDOL (ISOVUE-300) INJECTION 61% COMPARISON:  MRCP 05/03/2016.  CT abdomen and pelvis 04/25/2016. FINDINGS: Lower chest: Lung bases demonstrate emphysematous disease. There is some dependent atelectasis. Trace amount of left pleural fluid is noted. No right pleural effusion or pericardial effusion. Calcific aortic and coronary atherosclerosis is identified. Hepatobiliary: Cholecystostomy tube is in place and well positioned. There is mild gallbladder wall thickening. Stones are seen within the gallbladder. There is mild intrahepatic biliary ductal dilatation as seen on the prior exams. Pancreas: Unremarkable. No pancreatic ductal dilatation or surrounding inflammatory changes. Spleen: Normal in size without focal abnormality. Adrenals/Urinary Tract: Very small cyst lower pole right kidney is noted. The kidneys otherwise appear normal. Ureters and urinary bladder are unremarkable. The adrenal glands appear normal. Stomach/Bowel: Moderate stool burden is seen throughout the colon. A few scattered diverticula are noted. The  small bowel and appendix appear normal. Vascular/Lymphatic: No significant vascular findings are present. No enlarged abdominal or pelvic lymph nodes.Extensive aortoiliac atherosclerosis without aneurysm. No lymphadenopathy. Reproductive: Status post hysterectomy. No adnexal masses. Other: No abdominal wall hernia or abnormality. No abdominopelvic ascites. Musculoskeletal: No acute bony abnormality. Convex left lumbar scoliosis and degenerative disease are noted. IMPRESSION: No acute abnormality or finding to explain the patient's symptoms. Cholecystostomy tube in place without complicating feature. Emphysema. Atherosclerosis. Mild diverticulosis without diverticulitis. Electronically Signed   By: Inge Rise M.D.   On: 05/12/2016 19:55    EKG: Not performed, will obtain as appropriate.   Assessment/Plan  1. Intractable abdominal pain  - Patient attributes her pain to the cholecystostomy tube, though daughter notes she was had severe RUQ pain long before that  - Tube appears to be draining well with no surrounding erythema or drainage; CT abd with no acute pathology and good tube placement without any complicating features identified - No sign of obstruction or acute infectious process  - She was recently discharged with Percocet and had PO Dilaudid prescribed by her oncologist, but reports no relief with these  - She reports some subjective improvement after Dilaudid 1 mg IV x2 in ED, but continues to report severe pain - Continue pain-management, request palliative consultation for help with symptom management  2. Cholecystitis  - Pt was recently admitted with abdominal pain, found to have cholecystitis, and was treated with percutaneous drain, placed by IR on 05/04/16 - Drain continues to have bilious output; CT demonstrates appropriate placement without complication  - Transaminases have normalized  - Continue ciprofloxacin to complete the course  3. Metastatic colon cancer  -  Poorly-differentiated adenocarcinoma stage IV with periportal nodal mets - Followed by [name] and managed with reduced-dose Keytruda  - Has previously declined hospice   4. Normocytic anemia  - Hgb is 9.9 on admission  - This is stable relative to recent priors and there is no active bleeding identified   DVT prophylaxis: sq Lovenox  Code Status: Full  Family Communication: Daughter updated at bedside Disposition Plan: Observe on med-surg Consults called: None Admission status: Observation    Vianne Bulls, MD [facility] Pager [phone]  If 7PM-7AM, please contact night-coverage [email or web address]  Password [email or web address]  05/12/2016, 8:47 PM

## 2016-05-12 NOTE — ED Notes (Signed)
ED Provider at bedside. 

## 2016-05-12 NOTE — ED Notes (Signed)
Patient has a port.  Will access after patient returns from the restroom.

## 2016-05-12 NOTE — ED Notes (Signed)
Patient wants her port access  

## 2016-05-12 NOTE — ED Provider Notes (Signed)
[facility] DEPT Provider Note   CSN: [id number] Arrival date & time: 05/12/16  1625     History   Chief Complaint Chief Complaint  Patient presents with  . Abdominal Pain    HPI Joann Herrera is a 83 y.o. female.  HPI 83 yo F with extensive PMHx including metastatic colon CA, recent cholecystitis treated with perc drain here with ongoing RUQ abdominal pain. Pt was just discharged Sunday from drain placement. Since then she had progressively worsening aching, throbbing, sharp pain on her right flank made worse with any movement and palpation. She took oxycodone without relief and notified [name], who called in Dilaudid PO. She has been taking 2 mg dilaudid PO and her pain is still severe. She has associated nausea but no vomiting.  No fevers. Drainage has been yellow-green, persistent. Family is out of saline flushes.  Past Medical History:  Diagnosis Date  . Cancer (HCC)    squamous cell  . Colon carcinoma ([place])    dx. left Colon cancer,evidence of metastasis  . Complication of anesthesia    "morphine caused severe shakes"  . GERD (gastroesophageal reflux disease)   . Macular degeneration   . Pneumonia 04/2012  . PONV (postoperative nausea and vomiting)   . Rheumatoid Arthritis     Patient Active Problem List   Diagnosis Date Noted  . Adenopathy, hilar   . Right sided abdominal pain   . Encounter for palliative care   . Goals of care, counseling/discussion   . Acute cholecystitis s/p perc drainage 05/04/2016   . Calculus of gallbladder and bile duct with cholecystitis with obstruction   . Abdominal pain 05/03/2016  . Dehydration 09/08/2015  . Constipation 09/08/2015  . Atypical chest pain   . Metastatic colon cancer in female [facility])   . Cancer of left colon ([place]) 05/18/2015  . Abdominal aortic aneurysm ([place]) 04/21/2015  . Cholelithiasis 03/17/2015  . Dilated cbd, acquired 03/17/2015  . Gastritis 02/25/2015  . Aortic arch atherosclerosis ([place]) 02/25/2015  .  Numbness 02/25/2015  . Nicotine dependence 02/25/2015  . Abdominal pain, epigastric 01/06/2015  . Hypertriglyceridemia 12/11/2014  . Vertigo 11/16/2014  . CVA (cerebral infarction) 11/16/2014  . Arthritis 11/16/2014  . Pre-syncope 11/16/2014  . Hypocalcemia 11/16/2014  . Squamous cell carcinoma 07/30/2014  . Insomnia 03/17/2014  . Fatigue 09/13/2013  . Depression 09/13/2013  . GERD (gastroesophageal reflux disease) 09/02/2013  . Unsteady gait 09/02/2013  . Dry eye 06/27/2013  . 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 Horseshoe tear of retina without detachment 03/31/2011  . Age-related macular degeneration, dry 03/31/2011    Past Surgical History:  Procedure Laterality Date  . CATARACT EXTRACTION, BILATERAL    . Cataracts    . COLONOSCOPY WITH PROPOFOL N/A 04/30/2015   Procedure: COLONOSCOPY WITH PROPOFOL;  Surgeon: Milus Banister, MD;  Location: [facility] ENDOSCOPY;  Service: Endoscopy;  Laterality: N/A;  . ESOPHAGOGASTRODUODENOSCOPY N/A 01/22/2015   Procedure: ESOPHAGOGASTRODUODENOSCOPY (EGD);  Surgeon: Milus Banister, MD;  Location: [facility];  Service: Endoscopy;  Laterality: N/A;  . EYE SURGERY     bilateral cataracts  . HAND SURGERY     Pt states she had both hands operated on for arthritis.  Marland Kitchen HAND SURGERY    . HEMORRHOID SURGERY    . IR GENERIC HISTORICAL  02/12/2016   IR US GUIDE VASC ACCESS RIGHT 02/12/2016 Corrie Mckusick, DO [facility]  . IR GENERIC HISTORICAL  02/12/2016   IR FLUORO GUIDE PORT INSERTION RIGHT 02/12/2016 Corrie Mckusick,  DO [facility]  . IR GENERIC HISTORICAL  05/04/2016   IR PERC CHOLECYSTOSTOMY 05/04/2016 Corrie Mckusick, DO [facility]  . JOINT REPLACEMENT    . MINOR HEMORRHOIDECTOMY    . SHOULDER ARTHROSCOPY Left   . SHOULDER SURGERY Left    arthroscopy  . TONSILLECTOMY    . TOTAL KNEE ARTHROPLASTY     x 2  . TOTAL KNEE ARTHROPLASTY Right   . TOTAL KNEE ARTHROPLASTY Left     OB History    Gravida Para Term Preterm AB Living   0 0 0  0 0     SAB TAB Ectopic Multiple Live Births   0 0 0           Home Medications    Prior to Admission medications   Medication Sig Start Date End Date Taking? Authorizing Provider  acetaminophen (TYLENOL) 500 MG tablet Take 1,000 mg by mouth every 6 (six) hours as needed for mild pain or moderate pain.    Yes Historical Provider, MD  ciprofloxacin (CIPRO) 500 MG tablet Take 1 tablet (500 mg total) by mouth 2 (two) times daily. 05/08/16  Yes Allie Bossier, MD  HYDROmorphone (DILAUDID) 2 MG tablet Take 2 mg by mouth every 6 (six) hours as needed for moderate pain or severe pain.  04/29/16  Yes Historical Provider, MD  lidocaine-prilocaine (EMLA) cream Apply 1 application topically as needed. Patient taking differently: Apply 1 application topically daily as needed (port access).  04/08/16  Yes Owens Shark, NP  ondansetron (ZOFRAN) 8 MG tablet Take 1 tablet (8 mg total) by mouth every 8 (eight) hours as needed for nausea or vomiting. 04/29/16  Yes Truitt Merle, MD  oxyCODONE-acetaminophen (PERCOCET/ROXICET) 5-325 MG tablet Take 1-2 tablets by mouth every 4 (four) hours as needed for moderate pain or severe pain. 05/08/16  Yes Allie Bossier, MD  Polyethyl Glycol-Propyl Glycol (SYSTANE OP) Apply 1-2 drops to eye 4 (four) times daily as needed (for dry eye).    Historical Provider, MD  traMADol (ULTRAM) 50 MG tablet Take 1 tablet (50 mg total) by mouth every 8 (eight) hours as needed. Patient taking differently: Take 50 mg by mouth every 8 (eight) hours as needed for moderate pain or severe pain.  04/08/16   Owens Shark, NP    Family History Family History  Problem Relation Age of Onset  . Cancer Sister 59    breast  . Diabetes Sister   . Arthritis Mother   . Arthritis-Osteo Mother   . Diabetes Brother   . Suicidality Brother   . Diabetes Brother   . Breast cancer Sister   . Cancer Maternal Aunt     breast cancer     Social History Social History  Substance Use Topics  . Smoking  status: Current Every Day Smoker    Packs/day: 1.00  . Smokeless tobacco: Never Used  . Alcohol use No     Allergies   Aspirin; Codeine; Morphine and related; and Penicillins   Review of Systems Review of Systems  Constitutional: Positive for fatigue and unexpected weight change. Negative for chills and fever.  HENT: Negative for congestion, rhinorrhea and sore throat.   Eyes: Negative for visual disturbance.  Respiratory: Negative for cough, shortness of breath and wheezing.   Cardiovascular: Negative for chest pain and leg swelling.  Gastrointestinal: Positive for abdominal pain and nausea. Negative for diarrhea and vomiting.  Genitourinary: Negative for dysuria, flank pain, vaginal bleeding and vaginal discharge.  Musculoskeletal: Negative  for neck pain.  Skin: Negative for rash.  Allergic/Immunologic: Negative for immunocompromised state.  Neurological: Positive for weakness. Negative for syncope and headaches.  Hematological: Does not bruise/bleed easily.  All other systems reviewed and are negative.    Physical Exam Updated Vital Signs BP (!) 119/106 (BP Location: Left Arm)   Pulse 68   Temp 97.5 F (36.4 C) (Oral)   Resp 17   Ht 5' 4.5" (1.638 m)   Wt 122 lb (55.3 kg)   SpO2 98%   BMI 20.62 kg/m   Physical Exam  Constitutional: She appears well-developed and well-nourished. She appears distressed (tearful, in pain).  HENT:  Head: Normocephalic.  Mouth/Throat: Oropharynx is clear and moist. No oropharyngeal exudate.  Eyes: Conjunctivae are normal. Pupils are equal, round, and reactive to light.  Neck: Neck supple.  Cardiovascular: Normal rate, regular rhythm and normal heart sounds.  Exam reveals no friction rub.   No murmur heard. Pulmonary/Chest: Effort normal and breath sounds normal. No respiratory distress. She has no wheezes. She has no rales.  Abdominal: Soft. She exhibits no distension. There is tenderness. There is guarding.  Right perc chole tube  in place, with small area of surrounding induration and brown crusting. No purulence. Diffuse, severe TTP around tube.  Musculoskeletal: She exhibits no edema.  Neurological: She is alert.  Skin: Skin is warm. No rash noted.  Nursing note and vitals reviewed.    ED Treatments / Results  Labs (all labs ordered are listed, but only abnormal results are displayed) Labs Reviewed  CBC WITH DIFFERENTIAL/PLATELET - Abnormal; Notable for the following:       Result Value   RBC 3.46 (*)    Hemoglobin 9.9 (*)    HCT 30.4 (*)    All other components within normal limits  COMPREHENSIVE METABOLIC PANEL - Abnormal; Notable for the following:    Calcium 8.3 (*)    Total Protein 6.2 (*)    Albumin 2.9 (*)    Alkaline Phosphatase 341 (*)    Total Bilirubin 0.2 (*)    All other components within normal limits  LIPASE, BLOOD - Abnormal; Notable for the following:    Lipase 110 (*)    All other components within normal limits  URINALYSIS, ROUTINE W REFLEX MICROSCOPIC - Abnormal; Notable for the following:    Leukocytes, UA SMALL (*)    Squamous Epithelial / LPF 0-5 (*)    All other components within normal limits  I-STAT CG4 LACTIC ACID, ED  I-STAT CG4 LACTIC ACID, ED    EKG  EKG Interpretation None       Radiology Ct Abdomen Pelvis W Contrast  Result Date: 05/12/2016 CLINICAL DATA:  Patient status post placement of percutaneous cholecystostomy 05/04/2016. Continued abdominal pain. EXAM: CT ABDOMEN AND PELVIS WITH CONTRAST TECHNIQUE: Multidetector CT imaging of the abdomen and pelvis was performed using the standard protocol following bolus administration of intravenous contrast. CONTRAST:  80 ml ISOVUE-300 IOPAMIDOL (ISOVUE-300) INJECTION 61% COMPARISON:  MRCP 05/03/2016.  CT abdomen and pelvis 04/25/2016. FINDINGS: Lower chest: Lung bases demonstrate emphysematous disease. There is some dependent atelectasis. Trace amount of left pleural fluid is noted. No right pleural effusion or  pericardial effusion. Calcific aortic and coronary atherosclerosis is identified. Hepatobiliary: Cholecystostomy tube is in place and well positioned. There is mild gallbladder wall thickening. Stones are seen within the gallbladder. There is mild intrahepatic biliary ductal dilatation as seen on the prior exams. Pancreas: Unremarkable. No pancreatic ductal dilatation or surrounding inflammatory changes. Spleen: Normal  in size without focal abnormality. Adrenals/Urinary Tract: Very small cyst lower pole right kidney is noted. The kidneys otherwise appear normal. Ureters and urinary bladder are unremarkable. The adrenal glands appear normal. Stomach/Bowel: Moderate stool burden is seen throughout the colon. A few scattered diverticula are noted. The small bowel and appendix appear normal. Vascular/Lymphatic: No significant vascular findings are present. No enlarged abdominal or pelvic lymph nodes.Extensive aortoiliac atherosclerosis without aneurysm. No lymphadenopathy. Reproductive: Status post hysterectomy. No adnexal masses. Other: No abdominal wall hernia or abnormality. No abdominopelvic ascites. Musculoskeletal: No acute bony abnormality. Convex left lumbar scoliosis and degenerative disease are noted. IMPRESSION: No acute abnormality or finding to explain the patient's symptoms. Cholecystostomy tube in place without complicating feature. Emphysema. Atherosclerosis. Mild diverticulosis without diverticulitis. Electronically Signed   By: Inge Rise M.D.   On: 05/12/2016 19:55    Procedures Procedures (including critical care time)  Medications Ordered in ED Medications  iopamidol (ISOVUE-300) 61 % injection (not administered)  HYDROmorphone (DILAUDID) injection 1 mg (1 mg Intravenous Given 05/12/16 1749)  ondansetron (ZOFRAN) injection 4 mg (4 mg Intravenous Given 05/12/16 1748)  iopamidol (ISOVUE-300) 61 % injection 100 mL (80 mLs Intravenous Contrast Given 05/12/16 1927)  HYDROmorphone  (DILAUDID) injection 1 mg (1 mg Intravenous Given 05/12/16 1950)     Initial Impression / Assessment and Plan / ED Course  I have reviewed the triage vital signs and the nursing notes.  Pertinent labs & imaging results that were available during my care of the patient were reviewed by me and considered in my medical decision making (see chart for details).  Clinical Course as of May 12 2018  Thu May 12, 2016  1934 CT Abdomen Pelvis W Contrast [CI]    Clinical Course User Index [CI] Duffy Bruce, MD    83 yo F with PMHx of metastatic colon CA, recent perc chole drain placement, here with intractable pain. On exam, pt in obvious distress 2/2 pain, moaning and tearful despite PO dilaudid at home. Labs overall reassuring and CT shows no new abscesses/complications. Pt has persistent pain despite multiple doses of IV dilaudid and has been maximizing therapy at home. Family does not feel comfortable managing pain at home. I discussed possibility of Palliative/Hospice but pt resistant to this. Will admit for pain control, further goals of care discussion.  Final Clinical Impressions(s) / ED Diagnoses   Final diagnoses:  Intractable pain  Metastatic cancer [facility])    New Prescriptions New Prescriptions   No medications on file     Duffy Bruce, MD 05/13/16 1211

## 2016-05-12 NOTE — ED Notes (Signed)
Patient assisted to restroom.  

## 2016-05-12 NOTE — Telephone Encounter (Signed)
Received call from daughter, Ivin Booty, stating that pt is in a lot of pain & wants her GB drain taken out b/c she thinks that is what is causing her pain.  Discussed with Dr Burr Medico & she suggested pt try the dilaudid which she has taken before & has at home & hopefully pain will subside soon.  She is also concerned that she only has 1 saline syringe to flush drain with & was told to flush once or maybe twice daily.  She has talked with a home care agency & someone is supposed to be coming out .  Suggested she try them again & make sure that this is planned.  Discussed Hospice & daughter reports that all the children are in support of Hospice but pt is not & feels that if she has Hospice come she will die the next day.  Reassured daughter that we like to have Hospice in the home before that time so that a good rapport can be established & this would be very helpful to manage pt's care.  She plans to discuss again with mother when her brother is in.  She was very appreciative of the care that her mother has received.

## 2016-05-12 NOTE — ED Triage Notes (Signed)
Patient had a drain placed in the RUQ last week.  Patient has has continuous pain since drain was placed and has progressively gotten worse over the last week.  Per EMS fluid from drain has sediment in it and is "green" in color.

## 2016-05-12 NOTE — ED Notes (Signed)
Bed: [id number] Expected date:  Expected time:  Means of arrival:  Comments: EMS- 83yo F, CA Pt, abdominal pain/recent procedure

## 2016-05-13 ENCOUNTER — Telehealth: Payer: Self-pay | Admitting: *Deleted

## 2016-05-13 ENCOUNTER — Other Ambulatory Visit: Payer: Medicare Other

## 2016-05-13 ENCOUNTER — Ambulatory Visit: Payer: Medicare Other | Admitting: Hematology

## 2016-05-13 DIAGNOSIS — K810 Acute cholecystitis: Secondary | ICD-10-CM | POA: Diagnosis not present

## 2016-05-13 DIAGNOSIS — R52 Pain, unspecified: Secondary | ICD-10-CM | POA: Diagnosis not present

## 2016-05-13 DIAGNOSIS — R1011 Right upper quadrant pain: Secondary | ICD-10-CM

## 2016-05-13 DIAGNOSIS — C799 Secondary malignant neoplasm of unspecified site: Secondary | ICD-10-CM

## 2016-05-13 DIAGNOSIS — C785 Secondary malignant neoplasm of large intestine and rectum: Secondary | ICD-10-CM

## 2016-05-13 LAB — COMPREHENSIVE METABOLIC PANEL
ALT: 34 U/L (ref 14–54)
ANION GAP: 7 (ref 5–15)
AST: 28 U/L (ref 15–41)
Albumin: 2.7 g/dL — ABNORMAL LOW (ref 3.5–5.0)
Alkaline Phosphatase: 334 U/L — ABNORMAL HIGH (ref 38–126)
BUN: 16 mg/dL (ref 6–20)
CHLORIDE: 106 mmol/L (ref 101–111)
CO2: 24 mmol/L (ref 22–32)
Calcium: 8.2 mg/dL — ABNORMAL LOW (ref 8.9–10.3)
Creatinine, Ser: 0.96 mg/dL (ref 0.44–1.00)
GFR calc Af Amer: 59 mL/min — ABNORMAL LOW (ref >60)
GFR calc non Af Amer: 51 mL/min — ABNORMAL LOW (ref >60)
Glucose, Bld: 105 mg/dL — ABNORMAL HIGH (ref 65–99)
Potassium: 3.9 mmol/L (ref 3.5–5.1)
SODIUM: 137 mmol/L (ref 135–145)
Total Bilirubin: 0.2 mg/dL — ABNORMAL LOW (ref 0.3–1.2)
Total Protein: 5.4 g/dL — ABNORMAL LOW (ref 6.5–8.1)

## 2016-05-13 MED ORDER — SENNA 8.6 MG PO TABS: 2.0000 | ORAL_TABLET | Freq: Every day | ORAL | 0 refills | Status: AC

## 2016-05-13 MED ORDER — POLYETHYLENE GLYCOL 3350 17 G PO PACK: 17.0000 g | PACK | Freq: Every day | ORAL | 0 refills | Status: AC

## 2016-05-13 MED ORDER — SENNA 8.6 MG PO TABS: 2.0000 | ORAL_TABLET | Freq: Every day | ORAL | Status: DC

## 2016-05-13 MED ORDER — HEPARIN SOD (PORK) LOCK FLUSH 100 UNIT/ML IV SOLN
500.0000 [IU] | INTRAVENOUS | Status: AC | PRN
  Administered 2016-05-13: 500 [IU]
  Filled 2016-05-13: qty 5

## 2016-05-13 MED ORDER — HYDROMORPHONE HCL 2 MG PO TABS
2.0000 mg | ORAL_TABLET | Freq: Once | ORAL | Status: AC
  Administered 2016-05-13: 2 mg via ORAL
  Filled 2016-05-13: qty 1

## 2016-05-13 NOTE — Progress Notes (Signed)
Discharge instructions reviewed with patient. Patient and daughter verbalized understanding. Patient to be discharged via private vehicle.

## 2016-05-13 NOTE — Discharge Summary (Signed)
Physician Discharge Summary  Joann Herrera [id number] DOB: July 14, 1923 DOA: 05/12/2016  PCP: [name], MD  Admit date: 05/12/2016 Discharge date: 05/13/2016  Admitted From: Home Disposition:  Home  Recommendations for Outpatient Follow-up:  1. Follow up with PCP in 1-2 weeks 2. Please obtain BMP/CBC in one week   Home Health: NO Equipment/Devices:None  Discharge Condition: Stable CODE STATUS: FULL Diet recommendation: Heart Healthy   Brief/Interim Summary: 83 y.o.femalewith a history of colon CA with periportal metastases presenting with worsening abdominal pain. She reports about 2 years of waxing/waning RUQ abdominal pain that is severe, aching, radiating to the back, sometimes worse with Movement. The patient was recently discharged from the hospital after a stay from 05/03/2016 through 05/08/2016. During the stay, the patient was diagnosed with chronic tract was cholecystitis and a percutaneous cholecystotomy tube was placed. She states that since discharge home, she has been able to eat and tolerate a diet. However, she complains of increased pain around her cholecystotomy tube site with movement and occasionally with breathing. However she is able to perform her normal daily activities. Because of her abdominal pain, the patient's medical oncologist, [name], recently prescribed Dilaudid tablets 2 mg every 4 hours when necessary pain. Upon further questioning, the patient's family have been cutting the tablets in half. Overall, the patient states that her pain has not changed since discharge from the hospital. In essence, she stated that her pain was not much worse. Upon further questioning, the patient has the expectation of having absolutely zero pain 24/7. In addition, the patient has been poorly compliant with her stool softeners and cathartics. However, since admission to the hospital the patient has had a bowel movement with some improvement of her abdominal pain. CT  of the abdomen and pelvis on 05/12/2016 showed no acute findings in the abdomen. Also showed a well-positioned cholecystotomy tube without complications and moderate stool burden. Upon further discussion with the patient and family, it appears that the patient has an unrealistic expectation of having zero pain 24/7.  I discussed the concept of having "manageable pain"and having the ability to perform her normal activities of daily living which she states she is able to perform it. In addition, the patient has been waiting until her pain was out of proportion at 10/10 before taking her opioids. I discussed the importance of taking her opioids before her pain becomes out of control. During the hospitalization, the patient was able to tolerate her diet, her pain was well-controlled on the medical floor with only one dose of IV Dilaudid 1 mg. Otherwise, her pain was well managed with Dilaudid 2 mg po.  The patient was sent home in stable condition with instructions to take her stool softeners and cathartics on a daily basis.  Discharge Diagnoses:  Intractable Abdominal pain -Upon further careful questioning, many contributing factors have resulted in her abdominal pain as well as the unrealistic expectation of having zero pain 24/7 -Patient is well-controlled during the hospitalization with po Dilaudid -Patient tolerating diet -Daily cathartics -Discussed importance of taking opioids before pain is out of control -05/12/2016 CT abdomen and pelvis--no acute findings  Chronic calculus cholecystitis -This maybe in part the reason for her chronic abdominal pain -05/04/16--HIDA--no GB filling suggesting acute cholecystitis -05/03/16--MRCP--cholelithiasis with choledocholithiasis, chronic intra-and extra biliary dilatation -05/04/16--perc cholecystotomy tube placed as pt is high risk surgical candidate -continue cipro -Transaminases normalized -Continue ciprofloxacin  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 on previous  reduced-dose [place]. CT 01/29/16 showed stable disease. Recent plan was to schedule PET scan.  - Will add [name] to treatment team. - Consider bowel regimen for chronic constipation once taking po - Previously declined hospice and surgery.  -PerDr. Krista Blue Fengoncology If her chronic cholecystitis does not resolve, I will not continue Keytruda. I do not think she is a candidate for other anticancer treatment options such as chemo, due to her advanced age, comorbidities and poor performance status.  Dehydration -continue IVF  FTT -nutrition consult  Constipation -Patient had a bowel movement this morning -needs regular bowel regimen   Discharge Instructions  Discharge Instructions    Diet general    Complete by:  As directed    Increase activity slowly    Complete by:  As directed      Allergies as of 05/13/2016      Reactions   Aspirin Other (See Comments)   Reaction: G I upset, hurts bladder   Codeine Nausea And Vomiting   Morphine And Related Nausea And Vomiting   Penicillins Hives, Swelling   Has patient had a PCN reaction causing immediate rash, facial/tongue/throat swelling, SOB or lightheadedness with hypotension: Yes Has patient had a PCN reaction causing severe rash involving mucus membranes or skin necrosis: No Has patient had a PCN reaction that required hospitalization No Has patient had a PCN reaction occurring within the last 10 years: No If all of the above answers are "NO", then may proceed with Cephalosporin use.      Medication List    STOP taking these medications   oxyCODONE-acetaminophen 5-325 MG tablet Commonly known as:  PERCOCET/ROXICET   traMADol 50 MG tablet Commonly known as:  ULTRAM     TAKE these medications   acetaminophen 500 MG tablet Commonly known as:  TYLENOL Take 1,000 mg by mouth every 6 (six) hours as needed for mild pain or  moderate pain.   ciprofloxacin 500 MG tablet Commonly known as:  CIPRO Take 1 tablet (500 mg total) by mouth 2 (two) times daily.   HYDROmorphone 2 MG tablet Commonly known as:  DILAUDID Take 2 mg by mouth every 6 (six) hours as needed for moderate pain or severe pain.   lidocaine-prilocaine cream Commonly known as:  EMLA Apply 1 application topically as needed. What changed:  when to take this  reasons to take this   ondansetron 8 MG tablet Commonly known as:  ZOFRAN Take 1 tablet (8 mg total) by mouth every 8 (eight) hours as needed for nausea or vomiting.   polyethylene glycol packet Commonly known as:  MIRALAX / GLYCOLAX Take 17 g by mouth daily.   senna 8.6 MG Tabs tablet Commonly known as:  SENOKOT Take 2 tablets (17.2 mg total) by mouth daily.   SYSTANE OP Apply 1-2 drops to eye 4 (four) times daily as needed (for dry eye).       Allergies  Allergen Reactions  . Aspirin Other (See Comments)    Reaction: G I upset, hurts bladder  . Codeine Nausea And Vomiting  . Morphine And Related Nausea And Vomiting  . Penicillins Hives and Swelling    Has patient had a PCN reaction causing immediate rash, facial/tongue/throat swelling, SOB or lightheadedness with hypotension: Yes Has patient had a PCN reaction causing severe rash involving mucus membranes or skin necrosis: No Has patient had a PCN reaction that required hospitalization No Has patient had a PCN reaction occurring within the last 10 years: No If all of the  above answers are "NO", then may proceed with Cephalosporin use.     Consultations:  none   Procedures/Studies: Ct Abdomen Pelvis Wo Contrast  Result Date: 04/25/2016 CLINICAL DATA:  Intermittent abdominal pain, history of colon cancer EXAM: CT ABDOMEN AND PELVIS WITHOUT CONTRAST TECHNIQUE: Multidetector CT imaging of the abdomen and pelvis was performed following the standard protocol without IV contrast. COMPARISON:  01/29/2016 FINDINGS: Lower  chest: No pleural effusion is visualized. Mild subpleural fibrosis and bronchiectasis again evident. Slight increased consolidation within the lingula and anterior left lung base with associated mildly dilated bronchi. Right subpleural density slightly smaller. Now contains air bronchograms. Patchy slightly nodular appearing infiltrates are present within the right greater than left posterior lung bases. Coronary artery calcifications. Heart size within normal limits. No large pericardial effusion. Hepatobiliary: Low-attenuation at the porta hepatis could relate to periportal edema. No gross focal hepatic abnormalities. Multiple stones present within the gallbladder lumen. No wall thickening. Extrahepatic common bile duct is enlarged, measuring up to 16 mm, similar compared to prior. Pancreas: No peripancreatic inflammation. Spleen: Normal in size without focal abnormality. Adrenals/Urinary Tract: Adrenal glands are within normal limits. Kidneys demonstrate no hydronephrosis or calcification. The bladder is normal. Stomach/Bowel: The stomach is nonenlarged. There is no dilated small bowel. Mild low sigmoid colon wall thickening presumably corresponds to history of colon cancer, this is less apparent compared to the previous exam. Appendix normal. Vascular/Lymphatic: Extensive atherosclerotic vascular disease of the aorta with focal ectasia of the infrarenal aorta. Porta hepatis adenopathy is poorly defined without contrast. Reproductive: Uterus and bilateral adnexa are unremarkable. Other: No free air or free fluid. Musculoskeletal: Degenerative changes of the spine. No suspicious or acute bone lesions are visualized. IMPRESSION: 1. Slight increased consolidation within the lingula and anterior left lung base, could relate to inflammation or infection, but progression from prior exam makes malignancy difficult to exclude. 2. Slight increased nodular appearing infiltrates within the right greater than left posterior  lung bases. 3. Multiple gallstones. Dilated extrahepatic common bowel duct is similar compared to the prior study. 4. Focal wall thickening involving the distal sigmoid colon presumably corresponds to the history of colon cancer. Porta hepatis adenopathy is poorly defined without contrast. Electronically Signed   By: Donavan Foil M.D.   On: 04/25/2016 02:41   Nm Hepatobiliary Liver Func  Result Date: 05/04/2016 CLINICAL DATA:  Abnormal LFTs. Chronic worsening right upper quadrant abdominal pain. Initial encounter. EXAM: NUCLEAR MEDICINE HEPATOBILIARY IMAGING TECHNIQUE: Sequential images of the abdomen were obtained out to 60 minutes following intravenous administration of radiopharmaceutical. The gallbladder was not visualized. As the patient has an allergy to morphine, slow intravenous infusion of 1.1 mcg of cholecystokinin was performed. RADIOPHARMACEUTICALS:  5.05 mCi Tc-41m Choletec IV COMPARISON:  None. FINDINGS: Prompt uptake and biliary excretion of activity by the liver is seen. Biliary activity passes into small bowel, consistent with patent common bile duct. The gallbladder is not visualized at 45 minutes after administration of radiopharmaceutical. As the patient has an allergy to morphine, slow intravenous infusion of cholecystokinin was performed. The gallbladder was not visualized at 1 hour following initiation of infusion. This is most compatible with acute cholecystitis, though in some cases, chronic cholecystitis can have a similar appearance. IMPRESSION: Prompt uptake and biliary excretion of activity by the liver. Gallbladder not visualized, even after infusion of cholecystokinin. This is most compatible with acute cholecystitis, though in some cases, chronic cholecystitis can have a similar appearance. Electronically Signed   By: Francoise Schaumann.D.  On: 05/04/2016 02:38   US Abdomen Complete  Result Date: 05/03/2016 CLINICAL DATA:  Abdominal pain EXAM: ABDOMEN ULTRASOUND COMPLETE  COMPARISON:  04/25/2016 FINDINGS: Gallbladder: Multiple gallstones are again identified stable from the prior CT examination. No gallbladder wall thickening or pericholecystic fluid is noted. Common bile duct: Diameter: Dilated at 11 mm. This is similar to that seen on prior CT examination. Liver: No focal lesion identified. Within normal limits in parenchymal echogenicity. IVC: No abnormality visualized. Pancreas: Visualized portion unremarkable. Spleen: Size and appearance within normal limits. Right Kidney: Length: 9.4 cm. Echogenicity within normal limits. No mass or hydronephrosis visualized. Left Kidney: Length: 9.3 cm. Echogenicity within normal limits. No mass or hydronephrosis visualized. Abdominal aorta: No aneurysm visualized. Other findings: None. IMPRESSION: Cholelithiasis. Common bile duct dilatation stable from the previous exam. Correlation with bilirubin level is recommended. Electronically Signed   By: Inez Catalina M.D.   On: 05/03/2016 15:06   Ct Abdomen Pelvis W Contrast  Result Date: 05/12/2016 CLINICAL DATA:  Patient status post placement of percutaneous cholecystostomy 05/04/2016. Continued abdominal pain. EXAM: CT ABDOMEN AND PELVIS WITH CONTRAST TECHNIQUE: Multidetector CT imaging of the abdomen and pelvis was performed using the standard protocol following bolus administration of intravenous contrast. CONTRAST:  80 ml ISOVUE-300 IOPAMIDOL (ISOVUE-300) INJECTION 61% COMPARISON:  MRCP 05/03/2016.  CT abdomen and pelvis 04/25/2016. FINDINGS: Lower chest: Lung bases demonstrate emphysematous disease. There is some dependent atelectasis. Trace amount of left pleural fluid is noted. No right pleural effusion or pericardial effusion. Calcific aortic and coronary atherosclerosis is identified. Hepatobiliary: Cholecystostomy tube is in place and well positioned. There is mild gallbladder wall thickening. Stones are seen within the gallbladder. There is mild intrahepatic biliary ductal dilatation  as seen on the prior exams. Pancreas: Unremarkable. No pancreatic ductal dilatation or surrounding inflammatory changes. Spleen: Normal in size without focal abnormality. Adrenals/Urinary Tract: Very small cyst lower pole right kidney is noted. The kidneys otherwise appear normal. Ureters and urinary bladder are unremarkable. The adrenal glands appear normal. Stomach/Bowel: Moderate stool burden is seen throughout the colon. A few scattered diverticula are noted. The small bowel and appendix appear normal. Vascular/Lymphatic: No significant vascular findings are present. No enlarged abdominal or pelvic lymph nodes.Extensive aortoiliac atherosclerosis without aneurysm. No lymphadenopathy. Reproductive: Status post hysterectomy. No adnexal masses. Other: No abdominal wall hernia or abnormality. No abdominopelvic ascites. Musculoskeletal: No acute bony abnormality. Convex left lumbar scoliosis and degenerative disease are noted. IMPRESSION: No acute abnormality or finding to explain the patient's symptoms. Cholecystostomy tube in place without complicating feature. Emphysema. Atherosclerosis. Mild diverticulosis without diverticulitis. Electronically Signed   By: Inge Rise M.D.   On: 05/12/2016 19:55   Mr 3d Recon At Scanner  Result Date: 05/03/2016 CLINICAL DATA:  83 year old female with history of colon cancer. Two year history of intermittent right upper quadrant abdominal pain. EXAM: MRI ABDOMEN WITHOUT AND WITH CONTRAST (INCLUDING MRCP) TECHNIQUE: Multiplanar multisequence MR imaging of the abdomen was performed both before and after the administration of intravenous contrast. Heavily T2-weighted images of the biliary and pancreatic ducts were obtained, and three-dimensional MRCP images were rendered by post processing. CONTRAST:  99mL MULTIHANCE GADOBENATE DIMEGLUMINE 529 MG/ML IV SOLN COMPARISON:  CT scan 04/25/2016 and abdominal ultrasound 05/03/2016. FINDINGS: Lower chest: The lung bases demonstrate  chronic basilar scarring and emphysema. No effusions or pulmonary lesions. No pericardial effusion. Hepatobiliary: No focal hepatic lesions are identified. Geographic fatty infiltration is noted. There is stable intra and extrahepatic biliary dilatation. The gallbladder is filled with gallstones.  The common bile duct is dilated to a maximum of 13.5 mm in the porta hepatis and 11.5 mm in the head of the pancreas. This is a stable finding. No common bile duct stones are identified. Pancreas:  No mass, inflammation or ductal dilatation. Spleen:  Normal size.  No focal lesions. Adrenals/Urinary Tract: The adrenal glands and kidneys are unremarkable. Lower pole right renal cyst is noted. Stomach/Bowel: The stomach, duodenum, visualized small bowel and visualize colon are grossly normal. Vascular/Lymphatic: Stable atherosclerotic calcifications involving the aorta and branch vessels but no aneurysm or dissection. Small scattered mesenteric and retroperitoneal lymph nodes but no mass or overt adenopathy. Other:  No ascites or abdominal wall hernia. Musculoskeletal: No significant bony findings. IMPRESSION: 1. Cholelithiasis but no findings suspicious for acute cholecystitis and no common bile duct stones. Chronic intra and extrahepatic biliary dilatation. 2. Normal MR appearance of the pancreas. Normal caliber and course of the main pancreatic duct. Electronically Signed   By: Marijo Sanes M.D.   On: 05/03/2016 21:18   Ir Perc Cholecystostomy  Result Date: 05/04/2016 INDICATION: 83 year old female with acute cholecystitis EXAM: CHOLECYSTOSTOMY MEDICATIONS: Patient is receiving Cipro in the hospital; The antibiotic was administered within an appropriate time frame prior to the initiation of the procedure. ANESTHESIA/SEDATION: Moderate (conscious) sedation was employed during this procedure. A total of Versed 2 point mg and Fentanyl 50 mcg was administered intravenously. Moderate Sedation Time: 20 minutes. The patient's  level of consciousness and vital signs were monitored continuously by radiology nursing throughout the procedure under my direct supervision. FLUOROSCOPY TIME:  Fluoroscopy Time: 1 minutes 18 seconds (7.4 mGy). COMPLICATIONS: None PROCEDURE: Informed written consent was obtained from the patient and the patient's family after a thorough discussion of the procedural risks, benefits and alternatives. All questions were addressed. Maximal Sterile Barrier Technique was utilized including caps, mask, sterile gowns, sterile gloves, sterile drape, hand hygiene and skin antiseptic. A timeout was performed prior to the initiation of the procedure. Ultrasound survey of the right upper quadrant was performed for planning purposes. Once the patient is prepped and draped in the usual sterile fashion, the skin and subcutaneous tissues overlying the gallbladder were generously infiltrated 1% lidocaine for local anesthesia. A coaxial needle was advanced under ultrasound guidance through the skin subcutaneous tissues and a small segment of liver into the gallbladder lumen. With removal of the stylet, spontaneous dark bile drainage occurred. Using modified Seldinger technique, a 10 French drain was placed into the gallbladder fossa, with aspiration of the sample for the lab. Contrast injection confirmed position of the tube within the gallbladder lumen. Drainage catheter was attached to gravity drain with a suture retention placed. Patient tolerated the procedure well and remained hemodynamically stable throughout. No complications were encountered and no significant blood loss encountered. IMPRESSION: Status post percutaneous cholecystostomy. Signed, Dulcy Fanny. Earleen Newport, DO [facility] Electronically Signed   By: Corrie Mckusick D.O.   On: 05/04/2016 16:32   Mr Abdomen Mrcp Moise Boring Contast  Result Date: 05/03/2016 CLINICAL DATA:  83 year old female with history of colon cancer.  Two year history of intermittent right upper quadrant abdominal pain. EXAM: MRI ABDOMEN WITHOUT AND WITH CONTRAST (INCLUDING MRCP) TECHNIQUE: Multiplanar multisequence MR imaging of the abdomen was performed both before and after the administration of intravenous contrast. Heavily T2-weighted images of the biliary and pancreatic ducts were obtained, and three-dimensional MRCP images were rendered by post processing. CONTRAST:  28mL MULTIHANCE GADOBENATE DIMEGLUMINE 529 MG/ML IV SOLN COMPARISON:  CT scan 04/25/2016 and abdominal ultrasound 05/03/2016. FINDINGS: Lower chest: The lung bases demonstrate chronic basilar scarring and emphysema. No effusions or pulmonary lesions. No pericardial effusion. Hepatobiliary: No focal hepatic lesions are identified. Geographic fatty infiltration is noted. There is stable intra and extrahepatic biliary dilatation. The gallbladder is filled with gallstones. The common bile duct is dilated to a maximum of 13.5 mm in the porta hepatis and 11.5 mm in the head of the pancreas. This is a stable finding. No common bile duct stones are identified. Pancreas:  No mass, inflammation or ductal dilatation. Spleen:  Normal size.  No focal lesions. Adrenals/Urinary Tract: The adrenal glands and kidneys are unremarkable. Lower pole right renal cyst is noted. Stomach/Bowel: The stomach, duodenum, visualized small bowel and visualize colon are grossly normal. Vascular/Lymphatic: Stable atherosclerotic calcifications involving the aorta and branch vessels but no aneurysm or dissection. Small scattered mesenteric and retroperitoneal lymph nodes but no mass or overt adenopathy. Other:  No ascites or abdominal wall hernia. Musculoskeletal: No significant bony findings. IMPRESSION: 1. Cholelithiasis but no findings suspicious for acute cholecystitis and no common bile duct stones. Chronic intra and extrahepatic biliary dilatation. 2. Normal MR appearance of the pancreas. Normal caliber and course of the  main pancreatic duct. Electronically Signed   By: Marijo Sanes M.D.   On: 05/03/2016 21:18         Discharge Exam: Vitals:   05/12/16 2254 05/13/16 0430  BP: 93/72 (!) 96/50  Pulse: 71 90  Resp: 16 18  Temp: 99.2 F (37.3 C) 98.2 F (36.8 C)   Vitals:   05/12/16 2100 05/12/16 2105 05/12/16 2254 05/13/16 0430  BP: (!) 112/54  93/72 (!) 96/50  Pulse: 111 66 71 90  Resp: 18  16 18   Temp:   99.2 F (37.3 C) 98.2 F (36.8 C)  TempSrc:   Oral Oral  SpO2: 92% 95% 95% 95%  Weight:   55.3 kg (122 lb)   Height:   5\' 4"  (1.626 m)     General: Pt is alert, awake, not in acute distress Cardiovascular: RRR, S1/S2 +, no rubs, no gallops Respiratory: CTA bilaterally, no wheezing, no rhonchi Abdominal: Soft, NT, ND, bowel sounds + Extremities: no edema, no cyanosis   The results of significant diagnostics from this hospitalization (including imaging, microbiology, ancillary and laboratory) are listed below for reference.    Significant Diagnostic Studies: Ct Abdomen Pelvis Wo Contrast  Result Date: 04/25/2016 CLINICAL DATA:  Intermittent abdominal pain, history of colon cancer EXAM: CT ABDOMEN AND PELVIS WITHOUT CONTRAST TECHNIQUE: Multidetector CT imaging of the abdomen and pelvis was performed following the standard protocol without IV contrast. COMPARISON:  01/29/2016 FINDINGS: Lower chest: No pleural effusion is visualized. Mild subpleural fibrosis and bronchiectasis again evident. Slight increased consolidation within the lingula and anterior left lung base with associated mildly dilated bronchi. Right subpleural density slightly smaller. Now contains air bronchograms. Patchy slightly nodular appearing infiltrates are present within the right greater than left posterior lung bases. Coronary artery calcifications. Heart size within normal limits. No large pericardial effusion. Hepatobiliary: Low-attenuation at the porta hepatis could relate to periportal edema. No gross focal hepatic  abnormalities. Multiple stones present within the gallbladder lumen. No wall thickening. Extrahepatic common bile duct is enlarged, measuring up to 16 mm, similar compared to prior. Pancreas: No peripancreatic inflammation. Spleen: Normal in size without focal abnormality. Adrenals/Urinary Tract: Adrenal glands are within normal limits. Kidneys demonstrate no hydronephrosis or calcification. The bladder is normal. Stomach/Bowel: The stomach is  nonenlarged. There is no dilated small bowel. Mild low sigmoid colon wall thickening presumably corresponds to history of colon cancer, this is less apparent compared to the previous exam. Appendix normal. Vascular/Lymphatic: Extensive atherosclerotic vascular disease of the aorta with focal ectasia of the infrarenal aorta. Porta hepatis adenopathy is poorly defined without contrast. Reproductive: Uterus and bilateral adnexa are unremarkable. Other: No free air or free fluid. Musculoskeletal: Degenerative changes of the spine. No suspicious or acute bone lesions are visualized. IMPRESSION: 1. Slight increased consolidation within the lingula and anterior left lung base, could relate to inflammation or infection, but progression from prior exam makes malignancy difficult to exclude. 2. Slight increased nodular appearing infiltrates within the right greater than left posterior lung bases. 3. Multiple gallstones. Dilated extrahepatic common bowel duct is similar compared to the prior study. 4. Focal wall thickening involving the distal sigmoid colon presumably corresponds to the history of colon cancer. Porta hepatis adenopathy is poorly defined without contrast. Electronically Signed   By: Donavan Foil M.D.   On: 04/25/2016 02:41   Nm Hepatobiliary Liver Func  Result Date: 05/04/2016 CLINICAL DATA:  Abnormal LFTs. Chronic worsening right upper quadrant abdominal pain. Initial encounter. EXAM: NUCLEAR MEDICINE HEPATOBILIARY IMAGING TECHNIQUE: Sequential images of the abdomen  were obtained out to 60 minutes following intravenous administration of radiopharmaceutical. The gallbladder was not visualized. As the patient has an allergy to morphine, slow intravenous infusion of 1.1 mcg of cholecystokinin was performed. RADIOPHARMACEUTICALS:  5.05 mCi Tc-24m Choletec IV COMPARISON:  None. FINDINGS: Prompt uptake and biliary excretion of activity by the liver is seen. Biliary activity passes into small bowel, consistent with patent common bile duct. The gallbladder is not visualized at 45 minutes after administration of radiopharmaceutical. As the patient has an allergy to morphine, slow intravenous infusion of cholecystokinin was performed. The gallbladder was not visualized at 1 hour following initiation of infusion. This is most compatible with acute cholecystitis, though in some cases, chronic cholecystitis can have a similar appearance. IMPRESSION: Prompt uptake and biliary excretion of activity by the liver. Gallbladder not visualized, even after infusion of cholecystokinin. This is most compatible with acute cholecystitis, though in some cases, chronic cholecystitis can have a similar appearance. Electronically Signed   By: Garald Balding M.D.   On: 05/04/2016 02:38   US Abdomen Complete  Result Date: 05/03/2016 CLINICAL DATA:  Abdominal pain EXAM: ABDOMEN ULTRASOUND COMPLETE COMPARISON:  04/25/2016 FINDINGS: Gallbladder: Multiple gallstones are again identified stable from the prior CT examination. No gallbladder wall thickening or pericholecystic fluid is noted. Common bile duct: Diameter: Dilated at 11 mm. This is similar to that seen on prior CT examination. Liver: No focal lesion identified. Within normal limits in parenchymal echogenicity. IVC: No abnormality visualized. Pancreas: Visualized portion unremarkable. Spleen: Size and appearance within normal limits. Right Kidney: Length: 9.4 cm. Echogenicity within normal limits. No mass or hydronephrosis visualized. Left Kidney:  Length: 9.3 cm. Echogenicity within normal limits. No mass or hydronephrosis visualized. Abdominal aorta: No aneurysm visualized. Other findings: None. IMPRESSION: Cholelithiasis. Common bile duct dilatation stable from the previous exam. Correlation with bilirubin level is recommended. Electronically Signed   By: Inez Catalina M.D.   On: 05/03/2016 15:06   Ct Abdomen Pelvis W Contrast  Result Date: 05/12/2016 CLINICAL DATA:  Patient status post placement of percutaneous cholecystostomy 05/04/2016. Continued abdominal pain. EXAM: CT ABDOMEN AND PELVIS WITH CONTRAST TECHNIQUE: Multidetector CT imaging of the abdomen and pelvis was performed using the standard protocol following bolus administration of intravenous  contrast. CONTRAST:  80 ml ISOVUE-300 IOPAMIDOL (ISOVUE-300) INJECTION 61% COMPARISON:  MRCP 05/03/2016.  CT abdomen and pelvis 04/25/2016. FINDINGS: Lower chest: Lung bases demonstrate emphysematous disease. There is some dependent atelectasis. Trace amount of left pleural fluid is noted. No right pleural effusion or pericardial effusion. Calcific aortic and coronary atherosclerosis is identified. Hepatobiliary: Cholecystostomy tube is in place and well positioned. There is mild gallbladder wall thickening. Stones are seen within the gallbladder. There is mild intrahepatic biliary ductal dilatation as seen on the prior exams. Pancreas: Unremarkable. No pancreatic ductal dilatation or surrounding inflammatory changes. Spleen: Normal in size without focal abnormality. Adrenals/Urinary Tract: Very small cyst lower pole right kidney is noted. The kidneys otherwise appear normal. Ureters and urinary bladder are unremarkable. The adrenal glands appear normal. Stomach/Bowel: Moderate stool burden is seen throughout the colon. A few scattered diverticula are noted. The small bowel and appendix appear normal. Vascular/Lymphatic: No significant vascular findings are present. No enlarged abdominal or pelvic lymph  nodes.Extensive aortoiliac atherosclerosis without aneurysm. No lymphadenopathy. Reproductive: Status post hysterectomy. No adnexal masses. Other: No abdominal wall hernia or abnormality. No abdominopelvic ascites. Musculoskeletal: No acute bony abnormality. Convex left lumbar scoliosis and degenerative disease are noted. IMPRESSION: No acute abnormality or finding to explain the patient's symptoms. Cholecystostomy tube in place without complicating feature. Emphysema. Atherosclerosis. Mild diverticulosis without diverticulitis. Electronically Signed   By: Inge Rise M.D.   On: 05/12/2016 19:55   Mr 3d Recon At Scanner  Result Date: 05/03/2016 CLINICAL DATA:  83 year old female with history of colon cancer. Two year history of intermittent right upper quadrant abdominal pain. EXAM: MRI ABDOMEN WITHOUT AND WITH CONTRAST (INCLUDING MRCP) TECHNIQUE: Multiplanar multisequence MR imaging of the abdomen was performed both before and after the administration of intravenous contrast. Heavily T2-weighted images of the biliary and pancreatic ducts were obtained, and three-dimensional MRCP images were rendered by post processing. CONTRAST:  41mL MULTIHANCE GADOBENATE DIMEGLUMINE 529 MG/ML IV SOLN COMPARISON:  CT scan 04/25/2016 and abdominal ultrasound 05/03/2016. FINDINGS: Lower chest: The lung bases demonstrate chronic basilar scarring and emphysema. No effusions or pulmonary lesions. No pericardial effusion. Hepatobiliary: No focal hepatic lesions are identified. Geographic fatty infiltration is noted. There is stable intra and extrahepatic biliary dilatation. The gallbladder is filled with gallstones. The common bile duct is dilated to a maximum of 13.5 mm in the porta hepatis and 11.5 mm in the head of the pancreas. This is a stable finding. No common bile duct stones are identified. Pancreas:  No mass, inflammation or ductal dilatation. Spleen:  Normal size.  No focal lesions. Adrenals/Urinary Tract: The adrenal  glands and kidneys are unremarkable. Lower pole right renal cyst is noted. Stomach/Bowel: The stomach, duodenum, visualized small bowel and visualize colon are grossly normal. Vascular/Lymphatic: Stable atherosclerotic calcifications involving the aorta and branch vessels but no aneurysm or dissection. Small scattered mesenteric and retroperitoneal lymph nodes but no mass or overt adenopathy. Other:  No ascites or abdominal wall hernia. Musculoskeletal: No significant bony findings. IMPRESSION: 1. Cholelithiasis but no findings suspicious for acute cholecystitis and no common bile duct stones. Chronic intra and extrahepatic biliary dilatation. 2. Normal MR appearance of the pancreas. Normal caliber and course of the main pancreatic duct. Electronically Signed   By: Marijo Sanes M.D.   On: 05/03/2016 21:18   Ir Perc Cholecystostomy  Result Date: 05/04/2016 INDICATION: 83 year old female with acute cholecystitis EXAM: CHOLECYSTOSTOMY MEDICATIONS: Patient is receiving Cipro in the hospital; The antibiotic was administered within an appropriate time frame prior  to the initiation of the procedure. ANESTHESIA/SEDATION: Moderate (conscious) sedation was employed during this procedure. A total of Versed 2 point mg and Fentanyl 50 mcg was administered intravenously. Moderate Sedation Time: 20 minutes. The patient's level of consciousness and vital signs were monitored continuously by radiology nursing throughout the procedure under my direct supervision. FLUOROSCOPY TIME:  Fluoroscopy Time: 1 minutes 18 seconds (7.4 mGy). COMPLICATIONS: None PROCEDURE: Informed written consent was obtained from the patient and the patient's family after a thorough discussion of the procedural risks, benefits and alternatives. All questions were addressed. Maximal Sterile Barrier Technique was utilized including caps, mask, sterile gowns, sterile gloves, sterile drape, hand hygiene and skin antiseptic. A timeout was performed prior to  the initiation of the procedure. Ultrasound survey of the right upper quadrant was performed for planning purposes. Once the patient is prepped and draped in the usual sterile fashion, the skin and subcutaneous tissues overlying the gallbladder were generously infiltrated 1% lidocaine for local anesthesia. A coaxial needle was advanced under ultrasound guidance through the skin subcutaneous tissues and a small segment of liver into the gallbladder lumen. With removal of the stylet, spontaneous dark bile drainage occurred. Using modified Seldinger technique, a 10 French drain was placed into the gallbladder fossa, with aspiration of the sample for the lab. Contrast injection confirmed position of the tube within the gallbladder lumen. Drainage catheter was attached to gravity drain with a suture retention placed. Patient tolerated the procedure well and remained hemodynamically stable throughout. No complications were encountered and no significant blood loss encountered. IMPRESSION: Status post percutaneous cholecystostomy. Signed, Dulcy Fanny. Earleen Newport, DO [facility] Electronically Signed   By: Corrie Mckusick D.O.   On: 05/04/2016 16:32   Mr Abdomen Mrcp Moise Boring Contast  Result Date: 05/03/2016 CLINICAL DATA:  83 year old female with history of colon cancer. Two year history of intermittent right upper quadrant abdominal pain. EXAM: MRI ABDOMEN WITHOUT AND WITH CONTRAST (INCLUDING MRCP) TECHNIQUE: Multiplanar multisequence MR imaging of the abdomen was performed both before and after the administration of intravenous contrast. Heavily T2-weighted images of the biliary and pancreatic ducts were obtained, and three-dimensional MRCP images were rendered by post processing. CONTRAST:  33mL MULTIHANCE GADOBENATE DIMEGLUMINE 529 MG/ML IV SOLN COMPARISON:  CT scan 04/25/2016 and abdominal ultrasound 05/03/2016. FINDINGS: Lower chest: The lung bases demonstrate chronic  basilar scarring and emphysema. No effusions or pulmonary lesions. No pericardial effusion. Hepatobiliary: No focal hepatic lesions are identified. Geographic fatty infiltration is noted. There is stable intra and extrahepatic biliary dilatation. The gallbladder is filled with gallstones. The common bile duct is dilated to a maximum of 13.5 mm in the porta hepatis and 11.5 mm in the head of the pancreas. This is a stable finding. No common bile duct stones are identified. Pancreas:  No mass, inflammation or ductal dilatation. Spleen:  Normal size.  No focal lesions. Adrenals/Urinary Tract: The adrenal glands and kidneys are unremarkable. Lower pole right renal cyst is noted. Stomach/Bowel: The stomach, duodenum, visualized small bowel and visualize colon are grossly normal. Vascular/Lymphatic: Stable atherosclerotic calcifications involving the aorta and branch vessels but no aneurysm or dissection. Small scattered mesenteric and retroperitoneal lymph nodes but no mass or overt adenopathy. Other:  No ascites or abdominal wall hernia. Musculoskeletal: No significant bony findings. IMPRESSION: 1. Cholelithiasis but no findings suspicious for acute cholecystitis and no common bile duct stones. Chronic intra and extrahepatic biliary dilatation. 2. Normal MR appearance of the pancreas. Normal caliber and course of  the main pancreatic duct. Electronically Signed   By: Marijo Sanes M.D.   On: 05/03/2016 21:18     Microbiology: Recent Results (from the past 240 hour(s))  Aerobic/Anaerobic Culture (surgical/deep wound)     Status: None   Collection Time: 05/04/16  4:21 PM  Result Value Ref Range Status   Specimen Description GALL BLADDER BILE  Final   Special Requests NONE  Final   Gram Stain   Final    ABUNDANT WBC PRESENT, PREDOMINANTLY PMN RARE GRAM POSITIVE COCCI IN PAIRS    Culture   Final    No growth aerobically or anaerobically. Performed at [facility]    Report Status 05/09/2016 FINAL   Final     Labs: Basic Metabolic Panel:  Recent Labs Lab 05/07/16 1434 05/12/16 1743 05/13/16 0526  NA 136 136 137  K 3.7 4.1 3.9  CL 106 106 106  CO2 24 23 24   GLUCOSE 123* 95 105*  BUN 7 19 16   CREATININE 0.83 0.81 0.96  CALCIUM 7.8* 8.3* 8.2*   Liver Function Tests:  Recent Labs Lab 05/07/16 1434 05/12/16 1743 05/13/16 0526  AST 18 34 28  ALT 60* 39 34  ALKPHOS 257* 341* 334*  BILITOT 0.4 0.2* 0.2*  PROT 5.1* 6.2* 5.4*  ALBUMIN 2.5* 2.9* 2.7*    Recent Labs Lab 05/12/16 1743  LIPASE 110*   No results for input(s): AMMONIA in the last 168 hours. CBC:  Recent Labs Lab 05/12/16 1743  WBC 6.7  NEUTROABS 4.1  HGB 9.9*  HCT 30.4*  MCV 87.9  PLT 303   Cardiac Enzymes: No results for input(s): CKTOTAL, CKMB, CKMBINDEX, TROPONINI in the last 168 hours. BNP: Invalid input(s): POCBNP CBG: No results for input(s): GLUCAP in the last 168 hours.  Time coordinating discharge:  Greater than 30 minutes  Signed:  Akiya Morr, DO [facility] Pager: [phone] 05/13/2016, 10:37 AM

## 2016-05-13 NOTE — Telephone Encounter (Signed)
Transition Care Management Follow-up Telephone Call   Date discharged? 05/13/2016   How have you been since you were released from the hospital? Called pt spoke w/daughter she states mom doing ok just cam home   Do you understand why you were in the hospital? YES   Do you understand the discharge instructions? YES   Where were you discharged to? Home   Items Reviewed:  Medications reviewed: YES  Allergies reviewed: YES  Dietary changes reviewed: NO  Referrals reviewed: No referral needed   Functional Questionnaire:   Activities of Daily Living (ADLs):   She states she are independent in the following: bathing and hygiene, feeding, continence, grooming, toileting and dressing States she require assistance with the following: ambulation and bathing and hygiene   Any transportation issues/concerns?: NO   Any patient concerns? {YES, daughter ask why appt needed to be made. Inform daughter once patients has been discharge they are to set-up a hosp f/u appt w/there PCP. Pt has not seen Dr. Quay Burow since 02/2015, and she is way over due for appt   Confirmed importance and date/time of follow-up visits scheduled YES, appt 05/23/16  Provider Appointment booked with Dr. Quay Burow  Confirmed with patient if condition begins to worsen call PCP or go to the ER.  Patient was given the office number and encouraged to call back with question or concerns.  : YES

## 2016-05-13 NOTE — Progress Notes (Signed)
CM consult that pt having difficulty obtaining supplies for drain from home health company. Pt active with [facility] for home health services. This CM called [facility] rep to inform of hospital admission and pt difficulty getting supplies. [facility] rep to follow up with pt and daughter. This CM met with daughter at bedside and daughter states she was able to get some supplies from the hospital. She was informed that I contacted the home health company as well. No other CM needs communicated. Marney Doctor RN,BSN,NCM [phone]

## 2016-05-16 ENCOUNTER — Telehealth: Payer: Self-pay | Admitting: *Deleted

## 2016-05-16 NOTE — Telephone Encounter (Signed)
I will see her at 3:45pm on 1/23, message sent to scheduler.   Truitt Merle MD

## 2016-05-16 NOTE — Telephone Encounter (Signed)
Please also check with pt and her daughter or son if she needs to see IR for the biliary drainage tube tomorrow, thanks.   Truitt Merle MD

## 2016-05-16 NOTE — Telephone Encounter (Signed)
Daughter Bartolo Darter called requesting an appt to meet with [name] along with her brother on Tuesday 05/17/16.  Ivin Booty stated both had several questions about their mother, and would like to discuss with md. Sharon's    Phone      [phone].

## 2016-05-17 ENCOUNTER — Other Ambulatory Visit: Payer: Self-pay | Admitting: Hematology

## 2016-05-17 MED ORDER — HYDROMORPHONE HCL 2 MG PO TABS: 2.0000 mg | ORAL_TABLET | Freq: Four times a day (QID) | ORAL | 0 refills | Status: AC | PRN

## 2016-05-17 MED ORDER — FENTANYL 12 MCG/HR TD PT72: 12.5000 ug | MEDICATED_PATCH | TRANSDERMAL | 0 refills | Status: AC

## 2016-05-17 MED FILL — fentaNYL 12 MCG/HR PT72: 12 | 15 days supply | Qty: 5 | Fill #0

## 2016-05-17 MED FILL — HYDROmorphone HCL 2 MG TABS: 2 | 37 days supply | Qty: 150 | Fill #0

## 2016-05-17 NOTE — Telephone Encounter (Signed)
Pt's son Elta Guadeloupe walked in to our clinic this morning. I met him and discussed. Patient is taking Dilaudid 2 mg every 3-4 hours, her pain is still not very well controlled. I refilled her dilaudid, and give her a new prescription of fentanyl patch 65mg/hr. I recommend hospice. MElta Guadeloupeand his sister are going [facility], pt has not agreed with hospice yet. She is very weak, needs 24/7 care at home.   YTruitt MerleMD

## 2016-05-18 ENCOUNTER — Telehealth: Payer: Self-pay | Admitting: *Deleted

## 2016-05-18 NOTE — Telephone Encounter (Signed)
Received call from Jerald Kief at [facility] stating that they discussed Hospice with pt's daughter & son yest & they called back today & mother has agreed to Hospice.  Hospice needs order for referral.  Referral order given per Dr Burr Medico.  They will go out tomorrow & will let us know if she agrees to hospice services.

## 2016-05-19 ENCOUNTER — Encounter: Payer: Self-pay | Admitting: Hematology

## 2016-05-19 DIAGNOSIS — Z434 Encounter for attention to other artificial openings of digestive tract: Secondary | ICD-10-CM | POA: Diagnosis not present

## 2016-05-19 DIAGNOSIS — C772 Secondary and unspecified malignant neoplasm of intra-abdominal lymph nodes: Secondary | ICD-10-CM | POA: Diagnosis not present

## 2016-05-19 DIAGNOSIS — C7802 Secondary malignant neoplasm of left lung: Secondary | ICD-10-CM | POA: Diagnosis not present

## 2016-05-19 DIAGNOSIS — K8010 Calculus of gallbladder with chronic cholecystitis without obstruction: Secondary | ICD-10-CM | POA: Diagnosis not present

## 2016-05-19 DIAGNOSIS — C187 Malignant neoplasm of sigmoid colon: Secondary | ICD-10-CM | POA: Diagnosis not present

## 2016-05-19 DIAGNOSIS — Z452 Encounter for adjustment and management of vascular access device: Secondary | ICD-10-CM | POA: Diagnosis not present

## 2016-05-19 NOTE — Progress Notes (Signed)
Faxed FMLA paperwork for son [name] to [facility] [employer] at [id number]

## 2016-05-20 DIAGNOSIS — K8010 Calculus of gallbladder with chronic cholecystitis without obstruction: Secondary | ICD-10-CM | POA: Diagnosis not present

## 2016-05-20 DIAGNOSIS — Z452 Encounter for adjustment and management of vascular access device: Secondary | ICD-10-CM | POA: Diagnosis not present

## 2016-05-20 DIAGNOSIS — C7802 Secondary malignant neoplasm of left lung: Secondary | ICD-10-CM | POA: Diagnosis not present

## 2016-05-20 DIAGNOSIS — Z434 Encounter for attention to other artificial openings of digestive tract: Secondary | ICD-10-CM | POA: Diagnosis not present

## 2016-05-20 DIAGNOSIS — C772 Secondary and unspecified malignant neoplasm of intra-abdominal lymph nodes: Secondary | ICD-10-CM | POA: Diagnosis not present

## 2016-05-20 DIAGNOSIS — C187 Malignant neoplasm of sigmoid colon: Secondary | ICD-10-CM | POA: Diagnosis not present

## 2016-05-21 DIAGNOSIS — Z434 Encounter for attention to other artificial openings of digestive tract: Secondary | ICD-10-CM | POA: Diagnosis not present

## 2016-05-21 DIAGNOSIS — K8010 Calculus of gallbladder with chronic cholecystitis without obstruction: Secondary | ICD-10-CM | POA: Diagnosis not present

## 2016-05-21 DIAGNOSIS — Z452 Encounter for adjustment and management of vascular access device: Secondary | ICD-10-CM | POA: Diagnosis not present

## 2016-05-21 DIAGNOSIS — C772 Secondary and unspecified malignant neoplasm of intra-abdominal lymph nodes: Secondary | ICD-10-CM | POA: Diagnosis not present

## 2016-05-21 DIAGNOSIS — C7802 Secondary malignant neoplasm of left lung: Secondary | ICD-10-CM | POA: Diagnosis not present

## 2016-05-21 DIAGNOSIS — C187 Malignant neoplasm of sigmoid colon: Secondary | ICD-10-CM | POA: Diagnosis not present

## 2016-05-22 DIAGNOSIS — C7802 Secondary malignant neoplasm of left lung: Secondary | ICD-10-CM | POA: Diagnosis not present

## 2016-05-22 DIAGNOSIS — Z452 Encounter for adjustment and management of vascular access device: Secondary | ICD-10-CM | POA: Diagnosis not present

## 2016-05-22 DIAGNOSIS — C187 Malignant neoplasm of sigmoid colon: Secondary | ICD-10-CM | POA: Diagnosis not present

## 2016-05-22 DIAGNOSIS — K8010 Calculus of gallbladder with chronic cholecystitis without obstruction: Secondary | ICD-10-CM | POA: Diagnosis not present

## 2016-05-22 DIAGNOSIS — Z434 Encounter for attention to other artificial openings of digestive tract: Secondary | ICD-10-CM | POA: Diagnosis not present

## 2016-05-22 DIAGNOSIS — C772 Secondary and unspecified malignant neoplasm of intra-abdominal lymph nodes: Secondary | ICD-10-CM | POA: Diagnosis not present

## 2016-05-22 NOTE — Progress Notes (Signed)
Subjective:    Patient ID: Joann Herrera, female    DOB: 1923-08-06, 83 y.o.   MRN: [id number]  HPI Pre-charting done - NO SHOW  ED: 04/25/16, 04/28/16, 05/01/16, 05/03/16 - Abdominal pain.  She has known gallstones with cholecystitis.  She has been to the ED multiple times for right sdiede abdominal pain.   She was treated with pain medication.  She has been following with her oncologist and had a PET scan to see the progression of her metastatic colon cancer.  She did have an appointment with surgery.   Admitted 05/03/16 - 05/08/16:  Chronic abdominal pain.  05/03/16: MRCP: cholelithiasis with choledocholithiases, chronic intra and extra biliary dilation.  05/04/16: HIDA with no GB filling suggesting acute cholecystitis.  05/04/16: perc cholecystostomy tube placed due to being high surgical risk.  Tube will remain in place 6-8 weeks.  Surgery will discuss cholecystectomy in a few weeks.   Admitted 04/25/16, discharged 05/13/16: she was admitted for intractable right sided abdominal pain.  She denies improvement in the pain since having the tube placed.  Her pain was severe and worse with deep breaths.  The pain would radiate through to her back.  The percocet she had was not helping the pain.  Her oncologist prescribed dilaudid, but it was ineffective.  She denies fever, vomiting, chest pain, palps, dyspnea.  She was eating well.  CT abdomen showed no acute process.  She was treated with IV Dilaudid.  She was not compliant with stool softeners at home, was probably waiting too long between pain medication doses and was expecting her pain to be 0-minimal. Her pain was controlled when she was discharged on dilaudid 2 mg every 6 hrs as needed. She was instructed to take her stool softeners daily.  Her pain was not controlled and her pain medication was adjusted by oncology to Fentanyl patch 12 mcg/hr, dilaudid 2 mg every 3-4 hrs.    She has metastatic colon cancer.  She is following with oncology.  She does not wish  to go on hospice and wants to continue treatment.  She has met with palliative care.   The family was planning on meeting with hospice without the patient.  Joann Herrera agreed to hospice on 05/18/16 and was referred.    She is currently independent with bathing, feeding continence, grooming, toileting and dressing.  She needs assistance with ambulation and bathing/hygeine.   Medications and allergies reviewed with patient and updated if appropriate.  Patient Active Problem List   Diagnosis Date Noted  . Intractable pain   . Metastatic cancer ([place])   . Normocytic anemia 05/12/2016  . Adenopathy, hilar   . Intractable right upper quadrant abdominal pain   . Encounter for palliative care   . Goals of care, counseling/discussion   . Acute cholecystitis s/p perc drainage 05/04/2016   . Calculus of gallbladder and bile duct with cholecystitis with obstruction   . Abdominal pain 05/03/2016  . Dehydration 09/08/2015  . Constipation 09/08/2015  . Atypical chest pain   . Metastatic colon cancer in female [facility])   . Cancer of left colon ([place]) 05/18/2015  . Abdominal aortic aneurysm ([place]) 04/21/2015  . Cholelithiasis 03/17/2015  . Dilated cbd, acquired 03/17/2015  . Gastritis 02/25/2015  . Aortic arch atherosclerosis ([place]) 02/25/2015  . Numbness 02/25/2015  . Nicotine dependence 02/25/2015  . Abdominal pain, epigastric 01/06/2015  . Hypertriglyceridemia 12/11/2014  . Vertigo 11/16/2014  . CVA (cerebral infarction) 11/16/2014  . Arthritis 11/16/2014  . Pre-syncope  11/16/2014  . Hypocalcemia 11/16/2014  . Squamous cell carcinoma 07/30/2014  . Insomnia 03/17/2014  . Fatigue 09/13/2013  . Depression 09/13/2013  . GERD (gastroesophageal reflux disease) 09/02/2013  . Unsteady gait 09/02/2013  . Dry eye 06/27/2013  . 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 Horseshoe tear of retina without detachment 03/31/2011  . Age-related macular degeneration, dry 03/31/2011    Current Outpatient  Prescriptions on File Prior to Visit  Medication Sig Dispense Refill  . acetaminophen (TYLENOL) 500 MG tablet Take 1,000 mg by mouth every 6 (six) hours as needed for mild pain or moderate pain.     . ciprofloxacin (CIPRO) 500 MG tablet Take 1 tablet (500 mg total) by mouth 2 (two) times daily. 60 tablet 0  . fentaNYL (DURAGESIC - DOSED MCG/HR) 12 MCG/HR Place 1 patch (12.5 mcg total) onto the skin every 3 (three) days. 5 patch 0  . HYDROmorphone (DILAUDID) 2 MG tablet Take 1 tablet (2 mg total) by mouth every 6 (six) hours as needed for moderate pain or severe pain. 150 tablet 0  . lidocaine-prilocaine (EMLA) cream Apply 1 application topically as needed. (Patient taking differently: Apply 1 application topically daily as needed (port access). ) 30 g 0  . ondansetron (ZOFRAN) 8 MG tablet Take 1 tablet (8 mg total) by mouth every 8 (eight) hours as needed for nausea or vomiting. 30 tablet 2  . Polyethyl Glycol-Propyl Glycol (SYSTANE OP) Apply 1-2 drops to eye 4 (four) times daily as needed (for dry eye).    . polyethylene glycol (MIRALAX / GLYCOLAX) packet Take 17 g by mouth daily. 14 each 0  . senna (SENOKOT) 8.6 MG TABS tablet Take 2 tablets (17.2 mg total) by mouth daily. 60 each 0   No current facility-administered medications on file prior to visit.     Past Medical History:  Diagnosis Date  . Cancer (H[facility])    squamous cell  . Colon carcinoma ([place])    dx. left Colon cancer,evidence of metastasis  . Complication of anesthesia    "morphine caused severe shakes"  . GERD (gastroesophageal reflux disease)   . Macular degeneration   . Pneumonia 04/2012  . PONV (postoperative nausea and vomiting)   . Rheumatoid Arthritis     Past Surgical History:  Procedure Laterality Date  . CATARACT EXTRACTION, BILATERAL    . Cataracts    . COLONOSCOPY WITH PROPOFOL N/A 04/30/2015   Procedure: COLONOSCOPY WITH PROPOFOL;  Surgeon: Milus Banister, MD;  Location: [facility];  Service: Endoscopy;   Laterality: N/A;  . ESOPHAGOGASTRODUODENOSCOPY N/A 01/22/2015   Procedure: ESOPHAGOGASTRODUODENOSCOPY (EGD);  Surgeon: Milus Banister, MD;  Location: [facility];  Service: Endoscopy;  Laterality: N/A;  . EYE SURGERY     bilateral cataracts  . HAND SURGERY     Pt states she had both hands operated on for arthritis.  Marland Kitchen HAND SURGERY    . HEMORRHOID SURGERY    . IR GENERIC HISTORICAL  02/12/2016   IR US GUIDE VASC ACCESS RIGHT 02/12/2016 Corrie Mckusick, DO [facility]  . IR GENERIC HISTORICAL  02/12/2016   IR FLUORO GUIDE PORT INSERTION RIGHT 02/12/2016 Corrie Mckusick, DO [facility]  . IR GENERIC HISTORICAL  05/04/2016   IR PERC CHOLECYSTOSTOMY 05/04/2016 Corrie Mckusick, DO [facility]  . JOINT REPLACEMENT    . MINOR HEMORRHOIDECTOMY    . SHOULDER ARTHROSCOPY Left   . SHOULDER SURGERY Left    arthroscopy  . TONSILLECTOMY    . TOTAL KNEE ARTHROPLASTY  x 2  . TOTAL KNEE ARTHROPLASTY Right   . TOTAL KNEE ARTHROPLASTY Left     Social History   Social History  . Marital status: Widowed    Spouse name: N/A  . Number of children: 3  . Years of education: N/A   Occupational History  . Retired    Social History Main Topics  . Smoking status: Current Every Day Smoker    Packs/day: 1.00  . Smokeless tobacco: Never Used  . Alcohol use No  . Drug use: No  . Sexual activity: Not on file   Other Topics Concern  . Not on file   Social History Narrative   ** Merged History Encounter **       Daughter in [place] Ivin Booty [place]) [phone]   Son in [place]   Son in [place]   Retired- Medical sales representative business, worked at [employer] for 28 years   Enjoys dancing and going to [place]   Completed 11th grade   Very poor vision due to macular degeneration          Family History  Problem Relation Age of Onset  . Cancer Sister 49    breast  . Diabetes Sister   . Arthritis Mother   . Arthritis-Osteo Mother   . Diabetes Brother   .  Suicidality Brother   . Diabetes Brother   . Breast cancer Sister   . Cancer Maternal Aunt     breast cancer     Review of Systems     Objective:  There were no vitals filed for this visit. Wt Readings from Last 3 Encounters:  05/12/16 122 lb (55.3 kg)  05/03/16 122 lb (55.3 kg)  05/01/16 122 lb (55.3 kg)   There is no height or weight on file to calculate BMI.   Physical Exam        Assessment & Plan:       This encounter was created in error - please disregard.

## 2016-05-23 ENCOUNTER — Encounter: Payer: Medicare Other | Admitting: Internal Medicine

## 2016-05-23 DIAGNOSIS — Z452 Encounter for adjustment and management of vascular access device: Secondary | ICD-10-CM | POA: Diagnosis not present

## 2016-05-23 DIAGNOSIS — Z434 Encounter for attention to other artificial openings of digestive tract: Secondary | ICD-10-CM | POA: Diagnosis not present

## 2016-05-23 DIAGNOSIS — C187 Malignant neoplasm of sigmoid colon: Secondary | ICD-10-CM | POA: Diagnosis not present

## 2016-05-23 DIAGNOSIS — C7802 Secondary malignant neoplasm of left lung: Secondary | ICD-10-CM | POA: Diagnosis not present

## 2016-05-23 DIAGNOSIS — C772 Secondary and unspecified malignant neoplasm of intra-abdominal lymph nodes: Secondary | ICD-10-CM | POA: Diagnosis not present

## 2016-05-23 DIAGNOSIS — K8010 Calculus of gallbladder with chronic cholecystitis without obstruction: Secondary | ICD-10-CM | POA: Diagnosis not present

## 2016-05-24 DIAGNOSIS — C7802 Secondary malignant neoplasm of left lung: Secondary | ICD-10-CM | POA: Diagnosis not present

## 2016-05-24 DIAGNOSIS — Z452 Encounter for adjustment and management of vascular access device: Secondary | ICD-10-CM | POA: Diagnosis not present

## 2016-05-24 DIAGNOSIS — C772 Secondary and unspecified malignant neoplasm of intra-abdominal lymph nodes: Secondary | ICD-10-CM | POA: Diagnosis not present

## 2016-05-24 DIAGNOSIS — K8010 Calculus of gallbladder with chronic cholecystitis without obstruction: Secondary | ICD-10-CM | POA: Diagnosis not present

## 2016-05-24 DIAGNOSIS — C187 Malignant neoplasm of sigmoid colon: Secondary | ICD-10-CM | POA: Diagnosis not present

## 2016-05-24 DIAGNOSIS — Z434 Encounter for attention to other artificial openings of digestive tract: Secondary | ICD-10-CM | POA: Diagnosis not present

## 2016-05-25 DIAGNOSIS — Z434 Encounter for attention to other artificial openings of digestive tract: Secondary | ICD-10-CM | POA: Diagnosis not present

## 2016-05-25 DIAGNOSIS — K8010 Calculus of gallbladder with chronic cholecystitis without obstruction: Secondary | ICD-10-CM | POA: Diagnosis not present

## 2016-05-25 DIAGNOSIS — C7802 Secondary malignant neoplasm of left lung: Secondary | ICD-10-CM | POA: Diagnosis not present

## 2016-05-25 DIAGNOSIS — Z452 Encounter for adjustment and management of vascular access device: Secondary | ICD-10-CM | POA: Diagnosis not present

## 2016-05-25 DIAGNOSIS — C772 Secondary and unspecified malignant neoplasm of intra-abdominal lymph nodes: Secondary | ICD-10-CM | POA: Diagnosis not present

## 2016-05-25 DIAGNOSIS — C187 Malignant neoplasm of sigmoid colon: Secondary | ICD-10-CM | POA: Diagnosis not present

## 2016-05-26 DIAGNOSIS — Z452 Encounter for adjustment and management of vascular access device: Secondary | ICD-10-CM | POA: Diagnosis not present

## 2016-05-26 DIAGNOSIS — C187 Malignant neoplasm of sigmoid colon: Secondary | ICD-10-CM | POA: Diagnosis not present

## 2016-05-26 DIAGNOSIS — C7802 Secondary malignant neoplasm of left lung: Secondary | ICD-10-CM | POA: Diagnosis not present

## 2016-05-26 DIAGNOSIS — K8010 Calculus of gallbladder with chronic cholecystitis without obstruction: Secondary | ICD-10-CM | POA: Diagnosis not present

## 2016-05-26 DIAGNOSIS — C772 Secondary and unspecified malignant neoplasm of intra-abdominal lymph nodes: Secondary | ICD-10-CM | POA: Diagnosis not present

## 2016-05-26 DIAGNOSIS — Z434 Encounter for attention to other artificial openings of digestive tract: Secondary | ICD-10-CM | POA: Diagnosis not present

## 2016-05-27 ENCOUNTER — Other Ambulatory Visit: Payer: Medicare Other

## 2016-05-27 ENCOUNTER — Ambulatory Visit: Payer: Medicare Other | Admitting: Hematology

## 2016-05-27 DIAGNOSIS — C187 Malignant neoplasm of sigmoid colon: Secondary | ICD-10-CM | POA: Diagnosis not present

## 2016-05-27 DIAGNOSIS — Z434 Encounter for attention to other artificial openings of digestive tract: Secondary | ICD-10-CM | POA: Diagnosis not present

## 2016-05-27 DIAGNOSIS — Z452 Encounter for adjustment and management of vascular access device: Secondary | ICD-10-CM | POA: Diagnosis not present

## 2016-05-27 DIAGNOSIS — C772 Secondary and unspecified malignant neoplasm of intra-abdominal lymph nodes: Secondary | ICD-10-CM | POA: Diagnosis not present

## 2016-05-27 DIAGNOSIS — C7802 Secondary malignant neoplasm of left lung: Secondary | ICD-10-CM | POA: Diagnosis not present

## 2016-05-27 DIAGNOSIS — K8010 Calculus of gallbladder with chronic cholecystitis without obstruction: Secondary | ICD-10-CM | POA: Diagnosis not present

## 2016-05-28 DIAGNOSIS — C772 Secondary and unspecified malignant neoplasm of intra-abdominal lymph nodes: Secondary | ICD-10-CM | POA: Diagnosis not present

## 2016-05-28 DIAGNOSIS — K8010 Calculus of gallbladder with chronic cholecystitis without obstruction: Secondary | ICD-10-CM | POA: Diagnosis not present

## 2016-05-28 DIAGNOSIS — Z452 Encounter for adjustment and management of vascular access device: Secondary | ICD-10-CM | POA: Diagnosis not present

## 2016-05-28 DIAGNOSIS — C187 Malignant neoplasm of sigmoid colon: Secondary | ICD-10-CM | POA: Diagnosis not present

## 2016-05-28 DIAGNOSIS — Z434 Encounter for attention to other artificial openings of digestive tract: Secondary | ICD-10-CM | POA: Diagnosis not present

## 2016-05-28 DIAGNOSIS — C7802 Secondary malignant neoplasm of left lung: Secondary | ICD-10-CM | POA: Diagnosis not present

## 2016-05-29 DIAGNOSIS — C7802 Secondary malignant neoplasm of left lung: Secondary | ICD-10-CM | POA: Diagnosis not present

## 2016-05-29 DIAGNOSIS — K8010 Calculus of gallbladder with chronic cholecystitis without obstruction: Secondary | ICD-10-CM | POA: Diagnosis not present

## 2016-05-29 DIAGNOSIS — C772 Secondary and unspecified malignant neoplasm of intra-abdominal lymph nodes: Secondary | ICD-10-CM | POA: Diagnosis not present

## 2016-05-29 DIAGNOSIS — C187 Malignant neoplasm of sigmoid colon: Secondary | ICD-10-CM | POA: Diagnosis not present

## 2016-05-29 DIAGNOSIS — Z434 Encounter for attention to other artificial openings of digestive tract: Secondary | ICD-10-CM | POA: Diagnosis not present

## 2016-05-29 DIAGNOSIS — Z452 Encounter for adjustment and management of vascular access device: Secondary | ICD-10-CM | POA: Diagnosis not present

## 2016-05-30 DIAGNOSIS — Z434 Encounter for attention to other artificial openings of digestive tract: Secondary | ICD-10-CM | POA: Diagnosis not present

## 2016-05-30 DIAGNOSIS — C7802 Secondary malignant neoplasm of left lung: Secondary | ICD-10-CM | POA: Diagnosis not present

## 2016-05-30 DIAGNOSIS — K8010 Calculus of gallbladder with chronic cholecystitis without obstruction: Secondary | ICD-10-CM | POA: Diagnosis not present

## 2016-05-30 DIAGNOSIS — C187 Malignant neoplasm of sigmoid colon: Secondary | ICD-10-CM | POA: Diagnosis not present

## 2016-05-30 DIAGNOSIS — C772 Secondary and unspecified malignant neoplasm of intra-abdominal lymph nodes: Secondary | ICD-10-CM | POA: Diagnosis not present

## 2016-05-30 DIAGNOSIS — Z452 Encounter for adjustment and management of vascular access device: Secondary | ICD-10-CM | POA: Diagnosis not present

## 2016-05-31 DIAGNOSIS — C7802 Secondary malignant neoplasm of left lung: Secondary | ICD-10-CM | POA: Diagnosis not present

## 2016-05-31 DIAGNOSIS — K8010 Calculus of gallbladder with chronic cholecystitis without obstruction: Secondary | ICD-10-CM | POA: Diagnosis not present

## 2016-05-31 DIAGNOSIS — Z434 Encounter for attention to other artificial openings of digestive tract: Secondary | ICD-10-CM | POA: Diagnosis not present

## 2016-05-31 DIAGNOSIS — C772 Secondary and unspecified malignant neoplasm of intra-abdominal lymph nodes: Secondary | ICD-10-CM | POA: Diagnosis not present

## 2016-05-31 DIAGNOSIS — C187 Malignant neoplasm of sigmoid colon: Secondary | ICD-10-CM | POA: Diagnosis not present

## 2016-05-31 DIAGNOSIS — Z452 Encounter for adjustment and management of vascular access device: Secondary | ICD-10-CM | POA: Diagnosis not present

## 2016-06-02 ENCOUNTER — Telehealth: Payer: Self-pay | Admitting: *Deleted

## 2016-06-02 DIAGNOSIS — K8010 Calculus of gallbladder with chronic cholecystitis without obstruction: Secondary | ICD-10-CM | POA: Diagnosis not present

## 2016-06-02 DIAGNOSIS — C7802 Secondary malignant neoplasm of left lung: Secondary | ICD-10-CM | POA: Diagnosis not present

## 2016-06-02 DIAGNOSIS — C187 Malignant neoplasm of sigmoid colon: Secondary | ICD-10-CM | POA: Diagnosis not present

## 2016-06-02 DIAGNOSIS — Z434 Encounter for attention to other artificial openings of digestive tract: Secondary | ICD-10-CM | POA: Diagnosis not present

## 2016-06-02 DIAGNOSIS — C772 Secondary and unspecified malignant neoplasm of intra-abdominal lymph nodes: Secondary | ICD-10-CM | POA: Diagnosis not present

## 2016-06-02 DIAGNOSIS — Z452 Encounter for adjustment and management of vascular access device: Secondary | ICD-10-CM | POA: Diagnosis not present

## 2016-06-02 NOTE — Telephone Encounter (Signed)
Received message from Arbie Cookey, RN @ [facility] re:  Pt expired today 06/03/2016 at  1153 am.  Per Butch Penny, Hospice physician will sign death certificate. [facility]   Phone    [phone].

## 2016-06-22 ENCOUNTER — Other Ambulatory Visit: Payer: Medicare Other

## 2016-06-23 DEATH — deceased

## 2016-07-05 IMAGING — CR DG CHEST 2V
2 series · 2 of 2 positions shown · non-contrast
Comparison: None.

CLINICAL DATA: Acute onset of dizziness, nausea and vomiting.
Initial encounter.

EXAM:
CHEST  2 VIEW

[w chest lat]
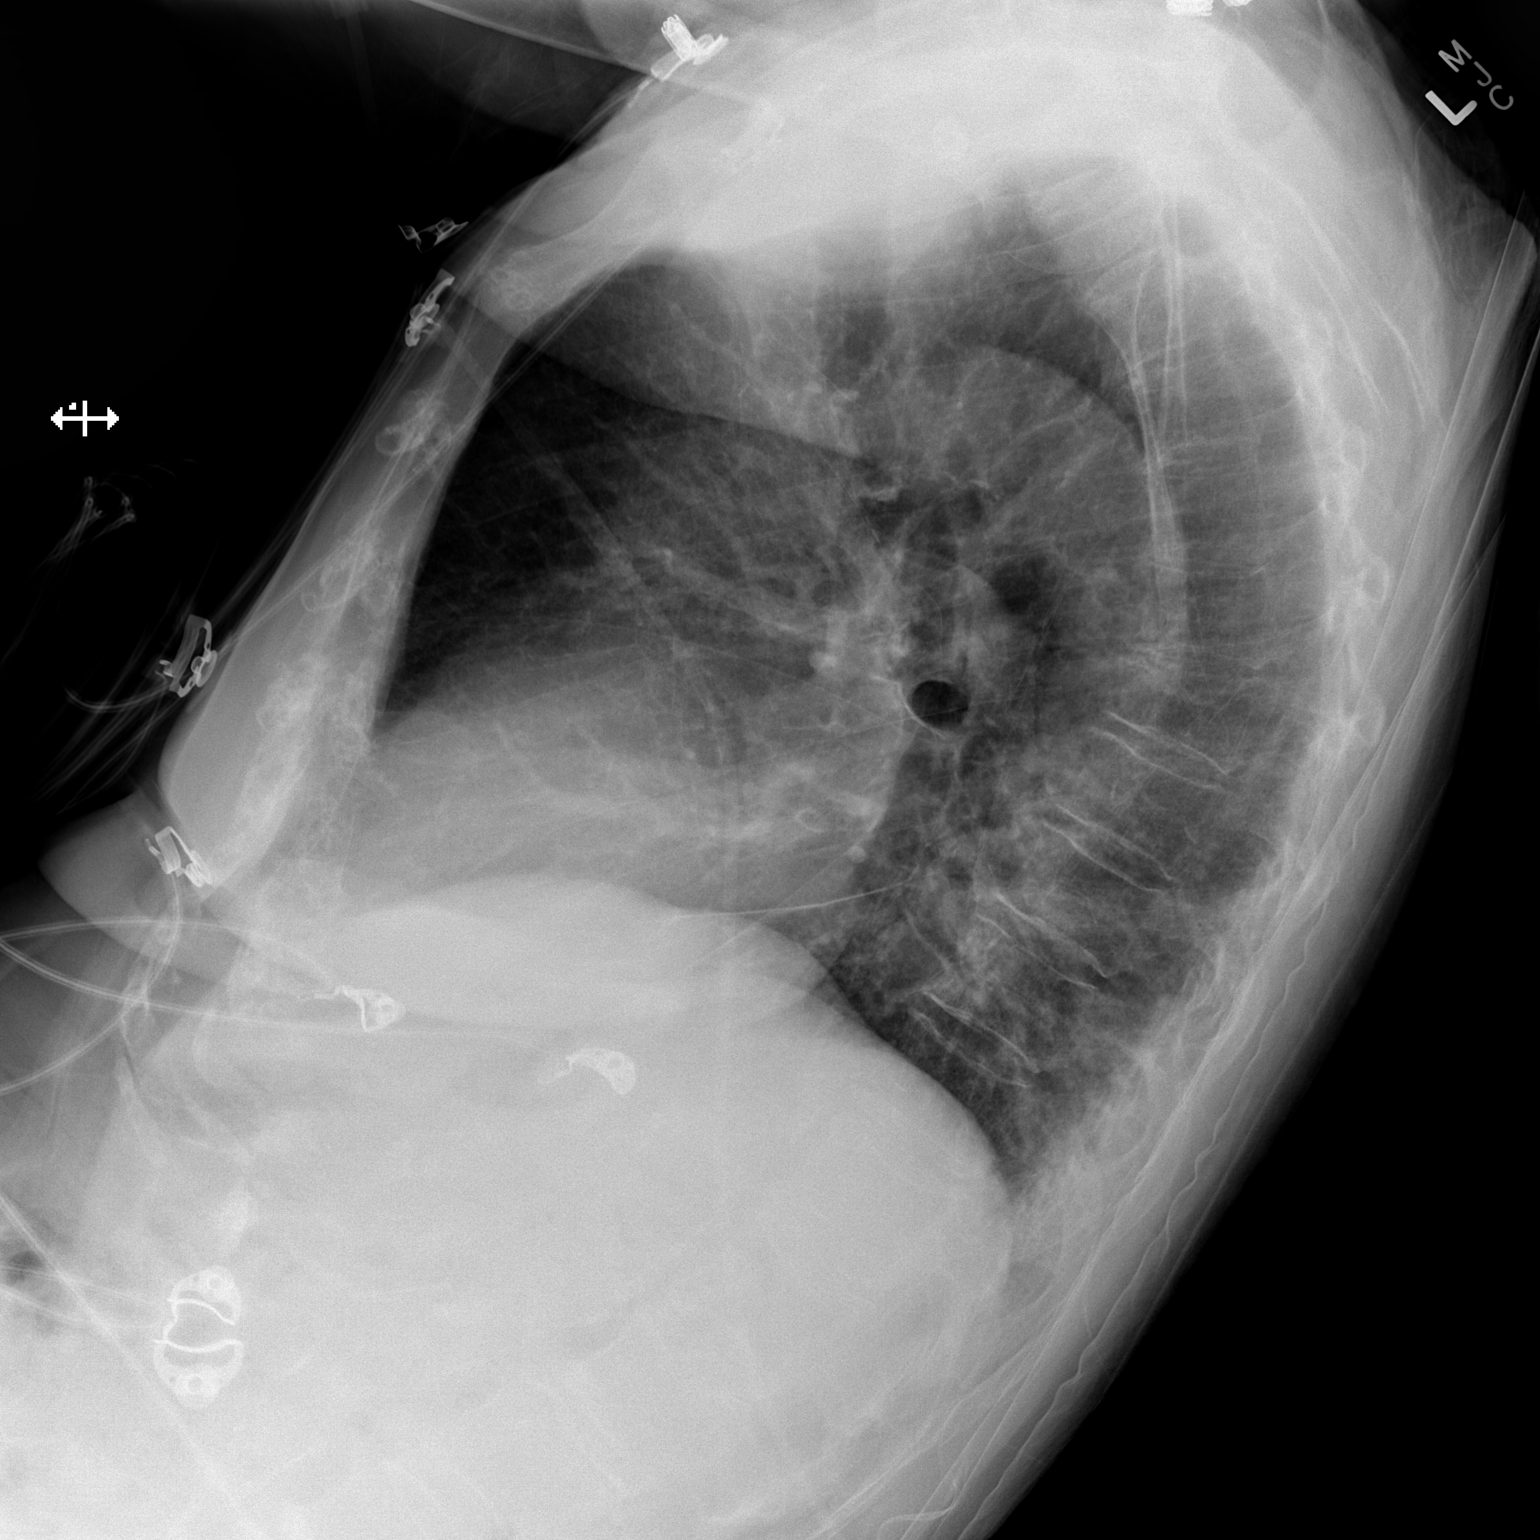

[x chest ap]
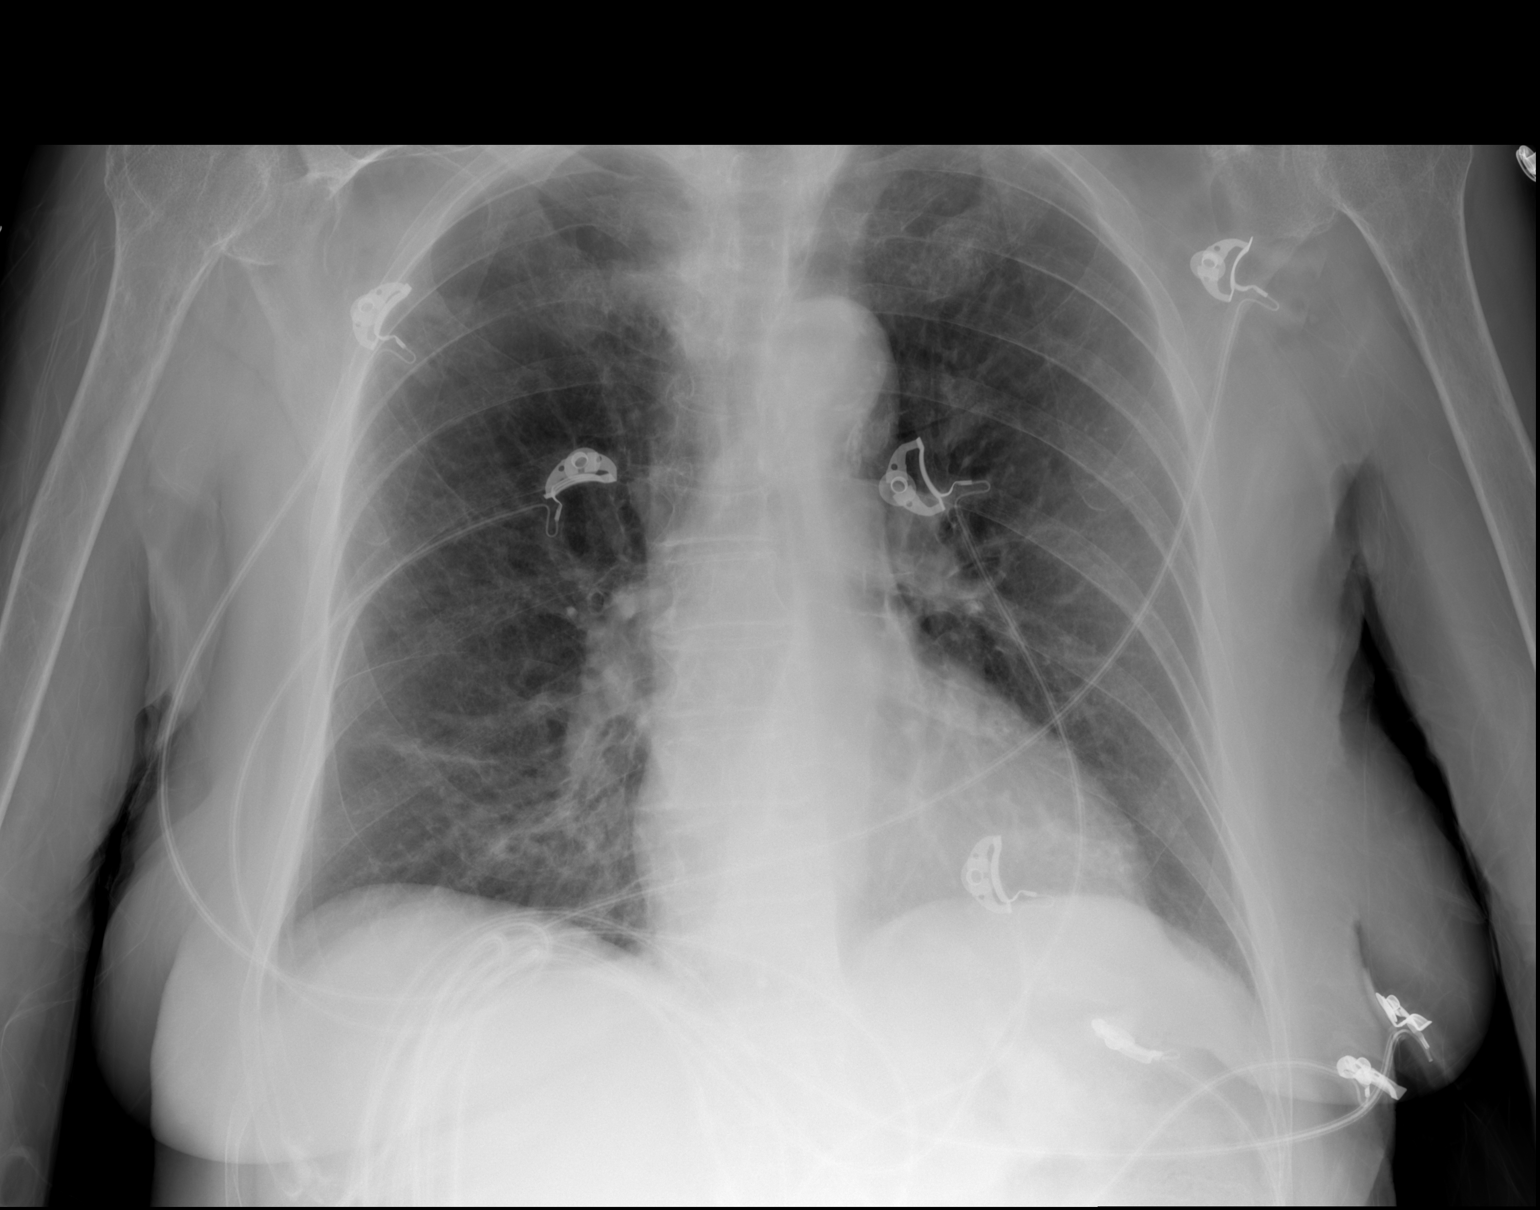

[2 of 2 positions shown; findings below may reference images not displayed]

FINDINGS: The lungs are well-aerated. Mild bibasilar atelectasis is noted.
Mild peribronchial thickening is seen. There is no evidence of
pleural effusion or pneumothorax.

The heart is normal in size; the mediastinal contour is within
normal limits. No acute osseous abnormalities are seen. Mild chronic
appearing compression deformity is noted at the mid thoracic spine.
IMPRESSION: Mild bibasilar atelectasis noted. Mild peribronchial thickening
seen.

## 2016-07-24 ENCOUNTER — Telehealth: Payer: Self-pay | Admitting: Hematology

## 2016-07-24 NOTE — Telephone Encounter (Signed)
[facility] informed by hospice, patient has expired. Call into switchboard.

## 2016-08-08 ENCOUNTER — Other Ambulatory Visit: Payer: Self-pay | Admitting: Nurse Practitioner
